# Patient Record
Sex: Female | Born: 1955 | Race: Black or African American | Hispanic: No | Marital: Single | Smoking: Never smoker
Health system: Southern US, Community
[De-identification: ages and names within clinical notes are randomized; demographics above are authoritative.]

## PROBLEM LIST (undated history)

## (undated) DIAGNOSIS — D259 Leiomyoma of uterus, unspecified: Secondary | ICD-10-CM

## (undated) DIAGNOSIS — E785 Hyperlipidemia, unspecified: Secondary | ICD-10-CM

## (undated) DIAGNOSIS — I10 Essential (primary) hypertension: Secondary | ICD-10-CM

## (undated) DIAGNOSIS — K760 Fatty (change of) liver, not elsewhere classified: Secondary | ICD-10-CM

## (undated) DIAGNOSIS — G629 Polyneuropathy, unspecified: Secondary | ICD-10-CM

## (undated) DIAGNOSIS — I679 Cerebrovascular disease, unspecified: Secondary | ICD-10-CM

## (undated) DIAGNOSIS — E663 Overweight: Secondary | ICD-10-CM

## (undated) DIAGNOSIS — R7989 Other specified abnormal findings of blood chemistry: Secondary | ICD-10-CM

## (undated) DIAGNOSIS — M300 Polyarteritis nodosa: Secondary | ICD-10-CM

## (undated) HISTORY — DX: Polyneuropathy, unspecified: G62.9

## (undated) HISTORY — DX: Fatty (change of) liver, not elsewhere classified: K76.0

## (undated) HISTORY — DX: Overweight: E66.3

## (undated) HISTORY — DX: Essential (primary) hypertension: I10

## (undated) HISTORY — DX: Other specified abnormal findings of blood chemistry: R79.89

## (undated) HISTORY — DX: Polyarteritis nodosa: M30.0

## (undated) HISTORY — DX: Hyperlipidemia, unspecified: E78.5

## (undated) HISTORY — DX: Cerebrovascular disease, unspecified: I67.9

## (undated) HISTORY — DX: Leiomyoma of uterus, unspecified: D25.9

---
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
## 2002-09-13 ENCOUNTER — Encounter: Payer: Self-pay | Admitting: Internal Medicine

## 2002-09-13 ENCOUNTER — Ambulatory Visit (HOSPITAL_COMMUNITY): Admission: RE | Admit: 2002-09-13 | Discharge: 2002-09-13 | Payer: Self-pay | Admitting: Internal Medicine

## 2007-11-30 ENCOUNTER — Ambulatory Visit: Payer: Self-pay | Admitting: Family Medicine

## 2007-11-30 DIAGNOSIS — I1A0 Resistant hypertension: Secondary | ICD-10-CM | POA: Insufficient documentation

## 2007-11-30 DIAGNOSIS — D259 Leiomyoma of uterus, unspecified: Secondary | ICD-10-CM | POA: Insufficient documentation

## 2007-11-30 DIAGNOSIS — I10 Essential (primary) hypertension: Secondary | ICD-10-CM | POA: Insufficient documentation

## 2007-12-22 ENCOUNTER — Ambulatory Visit (HOSPITAL_COMMUNITY): Admission: RE | Admit: 2007-12-22 | Discharge: 2007-12-22 | Payer: Self-pay | Admitting: Family Medicine

## 2007-12-23 ENCOUNTER — Ambulatory Visit: Payer: Self-pay | Admitting: Internal Medicine

## 2007-12-25 HISTORY — PX: COLONOSCOPY: SHX174

## 2008-01-04 ENCOUNTER — Other Ambulatory Visit: Admission: RE | Admit: 2008-01-04 | Discharge: 2008-01-04 | Payer: Self-pay | Admitting: Family Medicine

## 2008-01-04 ENCOUNTER — Ambulatory Visit: Payer: Self-pay | Admitting: Family Medicine

## 2008-01-04 ENCOUNTER — Encounter: Payer: Self-pay | Admitting: Family Medicine

## 2008-01-18 ENCOUNTER — Ambulatory Visit (HOSPITAL_COMMUNITY): Admission: RE | Admit: 2008-01-18 | Discharge: 2008-01-18 | Payer: Self-pay | Admitting: Internal Medicine

## 2008-01-18 ENCOUNTER — Ambulatory Visit: Payer: Self-pay | Admitting: Internal Medicine

## 2008-01-25 ENCOUNTER — Encounter: Payer: Self-pay | Admitting: Family Medicine

## 2008-01-27 ENCOUNTER — Encounter: Payer: Self-pay | Admitting: Family Medicine

## 2008-01-30 ENCOUNTER — Encounter: Payer: Self-pay | Admitting: Family Medicine

## 2008-01-30 LAB — CONVERTED CEMR LAB
Basophils Absolute: 0 10*3/uL (ref 0.0–0.1)
CO2: 27 meq/L (ref 19–32)
Calcium: 10.1 mg/dL (ref 8.4–10.5)
Chloride: 103 meq/L (ref 96–112)
Cholesterol: 220 mg/dL — ABNORMAL HIGH (ref 0–200)
Creatinine, Ser: 0.84 mg/dL (ref 0.40–1.20)
Hemoglobin: 12.5 g/dL (ref 12.0–15.0)
Monocytes Absolute: 0.4 10*3/uL (ref 0.1–1.0)
Neutro Abs: 3.8 10*3/uL (ref 1.7–7.7)
Neutrophils Relative %: 57 % (ref 43–77)
Platelets: 262 10*3/uL (ref 150–400)
Potassium: 4 meq/L (ref 3.5–5.3)
RDW: 14.4 % (ref 11.5–15.5)
Sodium: 142 meq/L (ref 135–145)
Triglycerides: 130 mg/dL (ref <150)
VLDL: 26 mg/dL (ref 0–40)

## 2008-02-14 ENCOUNTER — Encounter: Payer: Self-pay | Admitting: Family Medicine

## 2008-05-04 ENCOUNTER — Encounter: Payer: Self-pay | Admitting: Family Medicine

## 2008-05-09 ENCOUNTER — Ambulatory Visit: Payer: Self-pay | Admitting: Family Medicine

## 2008-05-09 DIAGNOSIS — E785 Hyperlipidemia, unspecified: Secondary | ICD-10-CM

## 2008-05-13 DIAGNOSIS — E663 Overweight: Secondary | ICD-10-CM

## 2008-09-24 ENCOUNTER — Ambulatory Visit: Payer: Self-pay | Admitting: Family Medicine

## 2008-09-25 ENCOUNTER — Encounter: Payer: Self-pay | Admitting: Family Medicine

## 2008-09-26 LAB — CONVERTED CEMR LAB
BUN: 15 mg/dL (ref 6–23)
CO2: 24 meq/L (ref 19–32)
Chloride: 107 meq/L (ref 96–112)
Cholesterol: 191 mg/dL (ref 0–200)
Creatinine, Ser: 0.91 mg/dL (ref 0.40–1.20)
HDL: 46 mg/dL (ref >39)
Potassium: 4 meq/L (ref 3.5–5.3)
Sodium: 143 meq/L (ref 135–145)

## 2009-01-03 ENCOUNTER — Ambulatory Visit (HOSPITAL_COMMUNITY): Admission: RE | Admit: 2009-01-03 | Discharge: 2009-01-03 | Payer: Self-pay | Admitting: Family Medicine

## 2009-01-24 ENCOUNTER — Other Ambulatory Visit: Admission: RE | Admit: 2009-01-24 | Discharge: 2009-01-24 | Payer: Self-pay | Admitting: Family Medicine

## 2009-01-24 ENCOUNTER — Ambulatory Visit: Payer: Self-pay | Admitting: Family Medicine

## 2009-01-24 DIAGNOSIS — H547 Unspecified visual loss: Secondary | ICD-10-CM | POA: Insufficient documentation

## 2009-01-25 ENCOUNTER — Encounter: Payer: Self-pay | Admitting: Family Medicine

## 2009-02-28 ENCOUNTER — Encounter: Payer: Self-pay | Admitting: Family Medicine

## 2009-12-17 ENCOUNTER — Telehealth: Payer: Self-pay | Admitting: Family Medicine

## 2009-12-17 ENCOUNTER — Ambulatory Visit: Payer: Self-pay | Admitting: Family Medicine

## 2009-12-24 ENCOUNTER — Encounter: Payer: Self-pay | Admitting: Family Medicine

## 2009-12-31 LAB — CONVERTED CEMR LAB
BUN: 14 mg/dL (ref 6–23)
Basophils Absolute: 0 10*3/uL (ref 0.0–0.1)
CO2: 28 meq/L (ref 19–32)
Calcium: 9.9 mg/dL (ref 8.4–10.5)
Chloride: 104 meq/L (ref 96–112)
Eosinophils Relative: 2 % (ref 0–5)
Glucose, Bld: 86 mg/dL (ref 70–99)
LDL Cholesterol: 140 mg/dL — ABNORMAL HIGH (ref 0–99)
Lymphocytes Relative: 30 % (ref 12–46)
Monocytes Relative: 6 % (ref 3–12)
Potassium: 3.9 meq/L (ref 3.5–5.3)
RBC: 4.63 M/uL (ref 3.87–5.11)
Sodium: 142 meq/L (ref 135–145)
TSH: 1.984 microintl units/mL (ref 0.350–4.500)

## 2010-01-06 ENCOUNTER — Ambulatory Visit (HOSPITAL_COMMUNITY): Admission: RE | Admit: 2010-01-06 | Discharge: 2010-01-06 | Payer: Self-pay | Admitting: Family Medicine

## 2010-02-19 ENCOUNTER — Ambulatory Visit: Payer: Self-pay | Admitting: Family Medicine

## 2010-02-19 ENCOUNTER — Other Ambulatory Visit
Admission: RE | Admit: 2010-02-19 | Discharge: 2010-02-19 | Payer: Self-pay | Source: Home / Self Care | Admitting: Family Medicine

## 2010-02-20 LAB — HM MAMMOGRAPHY

## 2010-02-20 LAB — HM COLONOSCOPY

## 2010-02-20 LAB — HM PAP SMEAR

## 2010-02-21 ENCOUNTER — Encounter: Payer: Self-pay | Admitting: Family Medicine

## 2010-03-16 ENCOUNTER — Encounter: Payer: Self-pay | Admitting: Internal Medicine

## 2010-03-23 LAB — CONVERTED CEMR LAB
Nitrite: NEGATIVE
Urobilinogen, UA: 2
WBC Urine, dipstick: NEGATIVE

## 2010-03-25 NOTE — Miscellaneous (Signed)
Summary: refill  Clinical Lists Changes  Medications: Rx of HYZAAR 100-25 MG TABS (LOSARTAN POTASSIUM-HCTZ) Take 1 tablet by mouth once a day;  #30 Each x 3;  Signed;  Entered by: Everitt Amber;  Authorized by: Syliva Overman MD;  Method used: Electronically to [facility] [address], [place], [place], [place]  [address], Ph: [phone], Fax: [phone]    Prescriptions: HYZAAR 100-25 MG TABS (LOSARTAN POTASSIUM-HCTZ) Take 1 tablet by mouth once a day  #30 Each x 3   Entered by:   Everitt Amber   Authorized by:   Syliva Overman MD   Signed by:   Everitt Amber on 02/28/2009   Method used:   Electronically to        [facility]  [address] (retail)       [address]       [place], [place]  [address]       Ph: [phone]       Fax: [phone]   RxID:   [id number]

## 2010-03-25 NOTE — Letter (Signed)
Summary: Letter  Letter   Imported By: Lind Guest 12/25/2009 11:32:08  _____________________________________________________________________  External Attachment:    Type:   Image     Comment:   External Document

## 2010-03-25 NOTE — Assessment & Plan Note (Signed)
Summary: meds   Vital Signs:  Patient profile:   55 year old female Menstrual status:  postmenopausal Height:      63 inches Weight:      169.25 pounds BMI:     30.09 O2 Sat:      97 % on Room air Pulse rate:   87 / minute Pulse rhythm:   regular Resp:     16 per minute BP sitting:   140 / 76  (left arm)  Vitals Entered By: Mauricia Area CMA (December 17, 2009 11:09 AM)  Nutrition Counseling: Patient's BMI is greater than 25 and therefore counseled on weight management options.  O2 Flow:  Room air CC: follow up   CC:  follow up.  History of Present Illness: Reports  thatshe has been doingwell. she is still not exercising on a daily basis, and he eating habits are unchanged, hence her weight is increased Denies recent fever or chills. Denies sinus pressure, nasal congestion , ear pain or sore throat. Denies chest congestion, or cough productive of sputum. Denies chest pain, palpitations, PND, orthopnea or leg swelling. Denies abdominal pain, nausea, vomitting, diarrhea or constipation. Denies change in bowel movements or bloody stool. Denies dysuria , frequency, incontinence or hesitancy. Denies  joint pain, swelling, or reduced mobility. Denies headaches, vertigo, seizures. Denies depression, anxiety or insomnia. Denies  rash, lesions, or itch.     Current Medications (verified): 1)  Calcium 600/vitamin D 600-400 Mg-Unit Chew (Calcium Carbonate-Vitamin D) .... Take 1 Tablet By Mouth Two Times A Day 2)  Hyzaar 100-25 Mg Tabs (Losartan Potassium-Hctz) .... Take 1 Tablet By Mouth Once A Day 3)  Centrum .Marland Kitchen.. 1 Tab Daily  Allergies (verified): No Known Drug Allergies  Review of Systems      See HPI Eyes:  Denies blurring and discharge. Psych:  Denies anxiety. Endo:  Denies excessive thirst and excessive urination. Heme:  Denies abnormal bruising and bleeding. Allergy:  Denies hives or rash and itching eyes.  Physical Exam  General:   Well-developed,well-nourished,in no acute distress; alert,appropriate and cooperative throughout examination HEENT: No facial asymmetry,  EOMI, No sinus tenderness, TM's Clear, oropharynx  pink and moist.   Chest: Clear to auscultation bilaterally.  CVS: S1, S2, No murmurs, No S3.   Abd: Soft, Nontender.  MS: Adequate ROM spine, hips, shoulders and knees.  Ext: No edema.   CNS: CN 2-12 intact, power tone and sensation normal throughout.   Skin: Intact, no visible lesions or rashes.  Psych: Good eye contact, normal affect.  Memory intact, not anxious or depressed appearing.    Impression & Recommendations:  Problem # 1:  UNSPECIFIED ESSENTIAL HYPERTENSION (ICD-401.9) Assessment Deteriorated  Her updated medication list for this problem includes:    Hyzaar 100-25 Mg Tabs (Losartan potassium-hctz) .Marland Kitchen... Take 1 tablet by mouth once a day Patient advised to follow low sodium diet rich in fruit and vegetables, and to commit to at least 30 minutes 5 days per week of regular exercise , to improve blood presure control.   Orders: T-Basic Metabolic Panel [id number])  Problem # 2:  OVERWEIGHT (ICD-278.02) Assessment: Deteriorated  Ht: 63 (12/17/2009)   Wt: 169.25 (12/17/2009)   BMI: 30.09 (12/17/2009) therapeutic lifestyle change discussed and encouraged  Complete Medication List: 1)  Calcium 600/vitamin D 600-400 Mg-unit Chew (Calcium carbonate-vitamin d) .... Take 1 tablet by mouth two times a day 2)  Hyzaar 100-25 Mg Tabs (Losartan potassium-hctz) .... 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  2)  It is important that you exercise regularly at least 20 minutes 5 times a week. If you develop  chest pain, have severe difficulty breathing, or feel very tired , stop exercising immediately and seek medical attention. 3)  You need to lose weight. Consider a lower calorie diet and regular exercise.  4)  BMP prior to visit, ICD-9: 5)  Lipid Panel prior to visit, ICD-9:   fasting asap 6)  TSH prior to visit, ICD-9: 7)  CBC w/ Diff prior to visit, ICD-9: 8)  [place] due pls schedule Prescriptions: HYZAAR 100-25 MG TABS (LOSARTAN POTASSIUM-HCTZ) Take 1 tablet by mouth once a day  #30 Each x 3   Entered by:   Adella Hare LPN   Authorized by:   Syliva Overman MD   Signed by:   Adella Hare LPN on 43/32/9518   Method used:   Electronically to        [facility]  [address] (retail)       [address]       [place], [place]  [address]       Ph: [phone]       Fax: [phone]   RxID:   [id number]    Orders Added: 1)  Est. Patient Level IV [17616] 2)  T-Basic Metabolic Panel [[id number]] 3)  T-Lipid Profile [80061-22930] 4)  T-CBC w/Diff [[id number]] 5)  T-TSH [[id number]] 6)  Influenza Vaccine NON MCR [00028] 7)  Tdap => 31yrs IM [90715] 8)  Admin 1st Vaccine [37169]   Immunizations Administered:  Influenza Vaccine # 1:    Vaccine Type: Fluvax Non-MCR    Site: right deltoid    Mfr: novartis    Dose: 0.5 ml    Route: IM    Given by: Adella Hare LPN    Exp. Date: 06/2010    Lot #: [id number]    VIS given: 09/17/09 version given December 17, 2009.  Tetanus Vaccine:    Vaccine Type: Tdap    Site: left deltoid    Mfr: GlaxoSmithKline    Dose: 0.5 ml    Route: IM    Given by: Adella Hare LPN    Exp. Date: 12/13/2011    Lot #: [id number]    VIS given: 01/11/08 version given December 17, 2009.   Immunizations Administered:  Influenza Vaccine # 1:    Vaccine Type: Fluvax Non-MCR    Site: right deltoid    Mfr: novartis    Dose: 0.5 ml    Route: IM    Given by: Adella Hare LPN    Exp. Date: 06/2010    Lot #: [id number]    VIS given: 09/17/09  version given December 17, 2009.  Tetanus Vaccine:    Vaccine Type: Tdap    Site: left deltoid    Mfr: GlaxoSmithKline    Dose: 0.5 ml    Route: IM    Given by: Adella Hare LPN    Exp. Date: 12/13/2011    Lot #: [id number]    VIS given: 01/11/08 version given December 17, 2009.

## 2010-03-25 NOTE — Progress Notes (Signed)
Summary: medicine  Phone Note Call from Patient   Summary of Call: needs her hyzaar send to [facility] [place] [facility] Initial call taken by: Lind Guest,  December 17, 2009 12:08 PM    Prescriptions: HYZAAR 100-25 MG TABS (LOSARTAN POTASSIUM-HCTZ) Take 1 tablet by mouth once a day  #30 Each x 1   Entered by:   Adella Hare LPN   Authorized by:   Syliva Overman MD   Signed by:   Adella Hare LPN on 16/11/9602   Method used:   Electronically to        [facility]  [address] (retail)       [address]       [place], [place]  [address]       Ph: [phone]       Fax: [phone]   RxID:   [id number]
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
## 2010-03-27 NOTE — Letter (Signed)
Summary: Pap Smear, Normal Letter, [facility]  [address]   [place], [place] [address]   Phone: [phone]  Fax: [phone]          February 21, 2010    Dear: Faith Hood    I am pleased to notify you that your PAP smear was normal.  You will need your next PAP smear in:     ____ 3 Months    ____ 6 Months    ____ 12 Months    Please call the office at our office number above, to schedule your next appointment.    Sincerely,     [facility]

## 2010-06-04 ENCOUNTER — Emergency Department (HOSPITAL_COMMUNITY)
Admission: EM | Admit: 2010-06-04 | Discharge: 2010-06-04 | Disposition: A | Discharge status: Home / Self Care | Payer: No Typology Code available for payment source | Attending: Emergency Medicine | Admitting: Emergency Medicine

## 2010-06-04 DIAGNOSIS — Y92410 Unspecified street and highway as the place of occurrence of the external cause: Secondary | ICD-10-CM | POA: Insufficient documentation

## 2010-06-04 DIAGNOSIS — IMO0001 Reserved for inherently not codable concepts without codable children: Secondary | ICD-10-CM | POA: Insufficient documentation

## 2010-06-04 DIAGNOSIS — I10 Essential (primary) hypertension: Secondary | ICD-10-CM | POA: Insufficient documentation

## 2010-06-04 DIAGNOSIS — M546 Pain in thoracic spine: Secondary | ICD-10-CM | POA: Insufficient documentation

## 2010-06-13 ENCOUNTER — Telehealth: Payer: Self-pay | Admitting: Family Medicine

## 2010-06-13 NOTE — Telephone Encounter (Signed)
Do you want to work patient in next week?

## 2010-06-13 NOTE — Telephone Encounter (Signed)
pls work in pt one day next week

## 2010-06-13 NOTE — Telephone Encounter (Signed)
Appt. 4.24.12 @ 4:00

## 2010-06-16 ENCOUNTER — Encounter: Payer: Self-pay | Admitting: Family Medicine

## 2010-06-17 ENCOUNTER — Ambulatory Visit (INDEPENDENT_AMBULATORY_CARE_PROVIDER_SITE_OTHER): Payer: No Typology Code available for payment source | Admitting: Family Medicine

## 2010-06-17 ENCOUNTER — Encounter: Payer: Self-pay | Admitting: Family Medicine

## 2010-06-17 DIAGNOSIS — I10 Essential (primary) hypertension: Secondary | ICD-10-CM

## 2010-06-17 MED ORDER — CLONIDINE HCL 0.1 MG PO TABS: 0.1000 mg | ORAL_TABLET | Freq: Every day | ORAL | Status: DC
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
## 2010-06-18 ENCOUNTER — Encounter: Payer: Self-pay | Admitting: Family Medicine

## 2010-06-20 ENCOUNTER — Other Ambulatory Visit: Payer: Self-pay | Admitting: Family Medicine

## 2010-06-23 ENCOUNTER — Other Ambulatory Visit: Payer: Self-pay

## 2010-06-23 MED ORDER — LOSARTAN POTASSIUM-HCTZ 100-25 MG PO TABS: 1.0000 | ORAL_TABLET | Freq: Every day | ORAL | Status: DC

## 2010-07-08 NOTE — Op Note (Signed)
NAME:  Faith Hood, Faith Hood                   ACCOUNT NO.:  [id number]   MEDICAL RECORD NO.:  [id number]          PATIENT TYPE:  AMB   LOCATION:  DAY                           FACILITY:  [facility]   PHYSICIAN:  R. Roetta Sessions, M.D. DATE OF BIRTH:  09-23-1955   DATE OF PROCEDURE:  01/18/2008  DATE OF DISCHARGE:                               OPERATIVE REPORT   PROCEDURE:  Ileocolonoscopy diagnostic.   INDICATIONS FOR PROCEDURE:  A 55 year old African American lady with  abdominal bloating and constipation.  She has never had her lower GI  tract imaged.  There is no family history of colorectal neoplasia.  Colonoscopy is now being done.  Risks, benefits, alternatives, and  limitations have been reviewed, and questions answered.  Please see the  documentation in the medical record for more information.   PROCEDURE NOTE:  O2 saturation, blood pressure, pulse, and respirations  were monitored throughout the entire procedure.   CONSCIOUS SEDATION:  Versed 3 mg IV and Demerol 75 mg IV in divided  doses.   INSTRUMENT:  Pentax video chip system.   FINDINGS:  Digital rectal exam revealed no abnormalities.  Endoscopic  findings:  The prep was adequate.  Colon:  Colonic mucosa was surveyed  from the rectosigmoid junction through the left transverse, right colon,  to the appendiceal orifice, ileocecal valve, and cecum.  These  structures were well seen and photographed for the record.  Terminal  ileum was intubated to 5 cm.  From this level, scope was slowly  withdrawn.  All previously mentioned mucosal surfaces were again seen.  The colonic mucosa as well as the terminal ileal mucosa appeared  entirely normal.  The scope was pulled down the rectum with thorough  examination of the rectal mucosa including the retroflex view of the  anal verge demonstrated no abnormalities.  The patient tolerated the  procedure well and was reacted in Endoscopy.   IMPRESSION:  Normal rectum:  Terminal ileum.   RECOMMENDATIONS:  1. Constipation literature provided to Ms. Helmuth.  2. Begin digestive advantage for constipation 1 capsule daily.  Ms.      Goonan is to go by my office for free samples in      addition to probiotic therapy.  We will advise Ms. Clune to use      MiraLax 17 g orally nightly if no bowel movement on any given day.  3. Repeat screening colonoscopy in 10 years.      Jonathon Bellows, M.D.  Electronically Signed     RMR/MEDQ  D:  01/18/2008  T:  01/18/2008  Job:  [id number]   cc:   Milus Mallick. Lodema Hong, M.D.  Fax: [phone]

## 2010-07-08 NOTE — Consult Note (Signed)
NAME:  Bistline, Sharesa                   ACCOUNT NO.:  [id number]   MEDICAL RECORD NO.:  [id number]          PATIENT TYPE:  AMB   LOCATION:  DAY                           FACILITY:  [facility]   PHYSICIAN:  R. Roetta Sessions, M.D. DATE OF BIRTH:  May 16, 1955   DATE OF CONSULTATION:  12/23/2007  DATE OF DISCHARGE:                                 CONSULTATION   REFERRING PHYSICIAN:  Milus Mallick. Lodema Hong, MD.   REASON FOR CONSULTATION:  Abdominal bloating and constipation, positive  family history of pancreatic cancer, and no prior colorectal cancer  screening.   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 Syliva Overman to further evaluate actually a 2-3 year history of  intermittent abdominal bloating in the setting of infrequent bowel  movements.  Ms. Stambaugh tells me she has 1-2 bowel movements weekly, has to  take stool softeners and laxatives on occasion, rarely has more than 3  bowel movements in a week, never passed any blood per rectum, has really  had much in the way of abdominal pain, although she does bloat and she  is pretty much put up with these symptoms over the past couple of years.  Unfortunately, her 16 year old mother just succumbed to pancreatic  cancer 3 weeks ago.  Otherwise, there is no family history of any GI  malignancy.  Faith Hood never had her lower GI tract evaluated.  She  denies odynophagia, dysphagia, esophageal reflux symptoms, nausea, or  vomiting.  She denies weight loss.  I do see that she underwent an  abdominal pelvic CT back just yesterday, on December 22, 2007, she was  found to have a fatty liver and probable leiomyoma in the uterus, but  really no other abnormalities.   PAST MEDICAL HISTORY:  Significant for chronic fatigue and hypertension.   PAST SURGERIES:  Exploratory laparoscopy years ago.   MEDICATIONS:  Benicar HCT 40/12.5 daily.   ALLERGIES:   No known drug allergies.   FAMILY HISTORY:  As outlined above.  Father died at age 55 with a brain  tumor.  She has 4 sisters in good health.   REVIEW OF SYSTEMS:  As in the history of present illness, has any chest  pain or dyspnea on exertion.  No fever, chills, or night sweats.  No  change in weight.   PHYSICAL EXAMINATION:  GENERAL:  A pleasant 55 year old lady resting  comfortably.  VITAL SIGNS:  Weight 163, height 5 feet 3 inches, temperature 98.7,  blood pressure 110/82, and pulse 60.  SKIN:  Warm and dry.  HEENT:  No scleral icterus.  Conjunctivae are pink.  CHEST:  Lungs are clear to auscultation.  CARDIAC:  Regular rate and rhythm without murmur, gallop, or rub.  BREASTS:  Deferred.  ABDOMEN:  Nondistended.  Positive bowel sounds.  Soft and nontender  without appreciable mass or organomegaly.  EXTREMITIES:  No edema.  RECTAL:  Deferred colonoscopy.   IMPRESSION:  Faith Hood is a pleasant 55 year old lady with  abdominal  bloating in the setting of constipation.  She does have a positive  family history of pancreatic cancer in her mother, but otherwise does  not have any history of family history of colorectal neoplasia or other  gastrointestinal malignancies.   I suspect her bloating is in part related to constipation.  She is in  need of colorectal cancer screening as well.   RECOMMENDATIONS:  We will go ahead and offer Ms. Bencivenga a colonoscopy in  the very near future.  Risks, benefits, alternatives, and limitations  have been reviewed.  Once her colon has been examined, I will make  further recommendations in terms of the management for abdominal  bloating and constipation.  She was noted to have a fatty liver on CT  imaging recently.  She probably will go ahead and have a hepatic profile  in the near future as well.  Further recommendations is to follow.   I would like to thank Dr. Syliva Overman for allowing me to see this  very nice lady today.      Jonathon Bellows, M.D.  Electronically Signed     RMR/MEDQ  D:  12/23/2007  T:  12/24/2007  Job:  [id number]   cc:   Milus Mallick. Lodema Hong, M.D.  Fax: [phone]

## 2010-08-01 ENCOUNTER — Encounter: Payer: Self-pay | Admitting: Family Medicine

## 2010-08-07 ENCOUNTER — Ambulatory Visit (INDEPENDENT_AMBULATORY_CARE_PROVIDER_SITE_OTHER): Payer: No Typology Code available for payment source | Admitting: Family Medicine

## 2010-08-07 ENCOUNTER — Encounter: Payer: Self-pay | Admitting: Family Medicine

## 2010-08-07 VITALS — BP 110/82 | HR 55 | Resp 16 | Ht 64.5 in | Wt 164.1 lb

## 2010-08-07 DIAGNOSIS — E785 Hyperlipidemia, unspecified: Secondary | ICD-10-CM

## 2010-08-07 DIAGNOSIS — I10 Essential (primary) hypertension: Secondary | ICD-10-CM

## 2010-08-07 MED ORDER — AMLODIPINE BESYLATE 2.5 MG PO TABS: 2.5000 mg | ORAL_TABLET | Freq: Every day | ORAL | Status: DC

## 2010-08-07 NOTE — Patient Instructions (Addendum)
F/u in 2 months.  You may stop the clonidine since it is making you  Too sleepy. Continue to take hyzaar every day, at 6pm, and add  Amlodipine 2.5mg  one daily at the same time.Start today.  Chem7 today.

## 2010-08-08 LAB — BASIC METABOLIC PANEL
BUN: 11 mg/dL (ref 6–23)
Calcium: 9.7 mg/dL (ref 8.4–10.5)
Chloride: 106 mEq/L (ref 96–112)
Creat: 0.76 mg/dL (ref 0.50–1.10)
Sodium: 143 mEq/L (ref 135–145)

## 2010-08-13 ENCOUNTER — Ambulatory Visit (HOSPITAL_COMMUNITY): Payer: No Typology Code available for payment source | Admitting: Specialist

## 2010-08-15 ENCOUNTER — Ambulatory Visit (HOSPITAL_COMMUNITY)
Admission: RE | Admit: 2010-08-15 | Discharge: 2010-08-15 | Disposition: A | Discharge status: Home / Self Care | Payer: No Typology Code available for payment source | Source: Ambulatory Visit | Attending: Orthopedic Surgery | Admitting: Orthopedic Surgery

## 2010-08-15 DIAGNOSIS — M79609 Pain in unspecified limb: Secondary | ICD-10-CM | POA: Insufficient documentation

## 2010-08-15 DIAGNOSIS — IMO0001 Reserved for inherently not codable concepts without codable children: Secondary | ICD-10-CM | POA: Insufficient documentation

## 2010-08-15 DIAGNOSIS — M25519 Pain in unspecified shoulder: Secondary | ICD-10-CM | POA: Insufficient documentation

## 2010-08-15 DIAGNOSIS — M25619 Stiffness of unspecified shoulder, not elsewhere classified: Secondary | ICD-10-CM | POA: Insufficient documentation

## 2010-08-15 DIAGNOSIS — I10 Essential (primary) hypertension: Secondary | ICD-10-CM | POA: Insufficient documentation

## 2010-08-15 DIAGNOSIS — M6281 Muscle weakness (generalized): Secondary | ICD-10-CM | POA: Insufficient documentation

## 2010-08-15 DIAGNOSIS — M542 Cervicalgia: Secondary | ICD-10-CM | POA: Insufficient documentation
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
## 2010-08-17 NOTE — Assessment & Plan Note (Signed)
Medication compliance addressed. Commitment to regular exercise and healthy  food choices, with portion control discussed. DASH diet and low fat diet discussed and literature offered. Changes in medication made at this visit.  

## 2010-08-17 NOTE — Assessment & Plan Note (Signed)
Low fat diet discussed , needs rept lab data

## 2010-08-18 ENCOUNTER — Ambulatory Visit (HOSPITAL_COMMUNITY)
Admission: RE | Admit: 2010-08-18 | Discharge: 2010-08-18 | Disposition: A | Discharge status: Home / Self Care | Payer: No Typology Code available for payment source | Source: Ambulatory Visit | Attending: Family Medicine | Admitting: Family Medicine

## 2010-08-21 ENCOUNTER — Ambulatory Visit (HOSPITAL_COMMUNITY)
Admission: RE | Admit: 2010-08-21 | Discharge: 2010-08-21 | Disposition: A | Discharge status: Home / Self Care | Payer: No Typology Code available for payment source | Source: Ambulatory Visit | Attending: Family Medicine | Admitting: Family Medicine

## 2010-08-25 ENCOUNTER — Ambulatory Visit (HOSPITAL_COMMUNITY)
Admission: RE | Admit: 2010-08-25 | Discharge: 2010-08-25 | Disposition: A | Discharge status: Home / Self Care | Payer: No Typology Code available for payment source | Source: Ambulatory Visit | Attending: Family Medicine | Admitting: Family Medicine

## 2010-08-25 DIAGNOSIS — M25619 Stiffness of unspecified shoulder, not elsewhere classified: Secondary | ICD-10-CM | POA: Insufficient documentation

## 2010-08-25 DIAGNOSIS — M79609 Pain in unspecified limb: Secondary | ICD-10-CM | POA: Insufficient documentation

## 2010-08-25 DIAGNOSIS — I10 Essential (primary) hypertension: Secondary | ICD-10-CM | POA: Insufficient documentation

## 2010-08-25 DIAGNOSIS — M25519 Pain in unspecified shoulder: Secondary | ICD-10-CM | POA: Insufficient documentation

## 2010-08-25 DIAGNOSIS — M6281 Muscle weakness (generalized): Secondary | ICD-10-CM | POA: Insufficient documentation

## 2010-08-25 DIAGNOSIS — IMO0001 Reserved for inherently not codable concepts without codable children: Secondary | ICD-10-CM | POA: Insufficient documentation

## 2010-08-25 DIAGNOSIS — M542 Cervicalgia: Secondary | ICD-10-CM | POA: Insufficient documentation

## 2010-08-28 ENCOUNTER — Ambulatory Visit (HOSPITAL_COMMUNITY)
Admission: RE | Admit: 2010-08-28 | Discharge: 2010-08-28 | Disposition: A | Discharge status: Home / Self Care | Payer: No Typology Code available for payment source | Source: Ambulatory Visit | Attending: Family Medicine | Admitting: Family Medicine

## 2010-09-03 ENCOUNTER — Ambulatory Visit (HOSPITAL_COMMUNITY)
Admission: RE | Admit: 2010-09-03 | Discharge: 2010-09-03 | Disposition: A | Discharge status: Home / Self Care | Payer: No Typology Code available for payment source | Source: Ambulatory Visit | Attending: Family Medicine | Admitting: Family Medicine

## 2010-09-03 DIAGNOSIS — M25519 Pain in unspecified shoulder: Secondary | ICD-10-CM | POA: Insufficient documentation

## 2010-09-03 DIAGNOSIS — M25511 Pain in right shoulder: Secondary | ICD-10-CM | POA: Insufficient documentation

## 2010-09-03 DIAGNOSIS — M6281 Muscle weakness (generalized): Secondary | ICD-10-CM | POA: Insufficient documentation

## 2010-09-03 NOTE — Progress Notes (Signed)
Occupational Therapy Treatment  Patient Name: KELSE PLOCH MRN: [id number] Today's Date: 09/03/2010         Time in:  2:05   Time out:  3:11              HPI: Symptoms/Limitations Symptoms: I lifted some little boxes at home and now it hurts Pain Assessment Currently in Pain?: Yes Pain Score:   3 Pain Location: Arm Pain Orientation: Right Pain Type: Chronic pain Pain Onset: More than a month ago Pain Frequency: Intermittent  Precautions/Restrictions    Mobility       Exercise/Treatments Cervical Exercises Shoulder Flexion: PROM;Supine;Prone;Other reps (comment);Right;Strengthening;Seated (1# x 12 rep prone, 2# x 12 seated) Shoulder Extension: Strengthening;Other reps (comment);Prone Theraband Level (Shoulder Extension): Level 3 (Green) Shoulder ABduction: PROM;Strengthening;Right;Other reps (comment);Seated (2# x 12 reps seated) Shoulder Horizontal ABduction: Strengthening;Both;Right;Prone;Seated;PROM (1# x 12 reps prone, 2# x 12 reps seated) Shoulder Retraction: Strengthening;Prone;Other (comment);Right (1# x 12 reps) Theraband Level (Shoulder Retraction): Level 3 (Green) Theraband Level (Row): Level 3 (Green) Shoulder Internal Rotation: PROM;Strengthening;Right;Other reps (comment);Supine;Prone;Seated (1# x12 prone, 2# x 12 seated) Shoulder External Rotation: PROM;Strengthening;Right;Other reps (comment);Supine;Prone;Seated (1# x 12 prone, 2# x 12 seated) UBE (Upper Arm Bike): 3 min reverse 2.0 resistance Shoulder Exercises Shoulder Flexion: PROM;Supine;Prone;Other reps (comment);Right;Strengthening;Seated (1# x 12 rep prone, 2# x 12 seated) Shoulder Extension: Strengthening;Other reps (comment);Prone Theraband Level (Shoulder Extension): Level 3 (Green) Shoulder Retraction: Strengthening;Prone;Other (comment);Right (1# x 12 reps) Theraband Level (Shoulder Retraction): Level 3 (Green) Shoulder Horizontal ABduction: Strengthening;Both;Right;Prone;Seated;PROM (1# x 12 reps  prone, 2# x 12 reps seated) Shoulder Horizontal ADduction: PROM;Strengthening;Right;Other reps (comment);Seated Shoulder ABduction: PROM;Strengthening;Right;Other reps (comment);Seated (2# x 12 reps seated) Shoulder External Rotation: PROM;Strengthening;Right;Other reps (comment);Supine;Prone;Seated (1# x 12 prone, 2# x 12 seated) Shoulder Internal Rotation: PROM;Strengthening;Right;Other reps (comment);Supine;Prone;Seated (1# x12 prone, 2# x 12 seated) Theraband Level (Row): Level 3 (Green) Additional ROM/Strengthening Exercises UBE (Upper Arm Bike): 3 min reverse 2.0 resistance Thumb Tacks: 2' Elevation/Depression - Shoulder Pro/Retraction: 2' "W" Arms: x 12 with 1# x to v 1# x 12 Additional Elbow Exercises UBE (Upper Arm Bike): 3 min reverse 2.0 resistance Theraputty - Flatten: green (green) Theraputty - Roll: green Theraputty - Grip: green Additional Wrist Exercises Theraputty - Flatten: green (green) Theraputty - Roll: green Theraputty - Grip: green Hand Exercises Theraputty - Flatten: green (green) Theraputty - Roll: green Theraputty - Grip: green Neurological Re-education Exercises Shoulder Flexion: PROM;Supine;Prone;Other reps (comment);Right;Strengthening;Seated (1# x 12 rep prone, 2# x 12 seated) Shoulder ABduction: PROM;Strengthening;Right;Other reps (comment);Seated (2# x 12 reps seated) Shoulder Horizontal ABduction: Strengthening;Both;Right;Prone;Seated;PROM (1# x 12 reps prone, 2# x 12 reps seated) Shoulder External Rotation: PROM;Strengthening;Right;Other reps (comment);Supine;Prone;Seated (1# x 12 prone, 2# x 12 seated) Shoulder Internal Rotation: PROM;Strengthening;Right;Other reps (comment);Supine;Prone;Seated (1# x12 prone, 2# x 12 seated) Grasp and Release Theraputty - Flatten: green (green) Theraputty - Roll: green Theraputty - Grip: green Work Immunologist UBE (Upper Arm Bike): 3 min reverse 2.0 resistance Manual Therapy Manual Therapy: Myofascial  release Myofascial Release: 2:05 to 2:35   Goals Long Term Goals Long Term Goal 1: Decrease pain in her right shoulder and forearm to 1/10 while working on the computer Long Term Goal 1 Progress: Progressing toward goal Long Term Goal 2: Return to prior level of independence with all daily, work and leisure activivities. Long Term Goal 2 Progress: Progressing toward goal Long Term Goal 3: Decrease fascial restrictions to minimal in her right shuolder and forearm. Long Term Goal 3 Progress: Progressing toward goal Long Term Goal 4:  Increase right grip strength by 10 pounds and pinch strength by 6 pounds for increased independence with opening containers. Long Term Goal 4 Progress: Progressing toward goal End of Session Patient Active Problem List  Diagnoses  . FIBROIDS, UTERUS  . HYPERLIPIDEMIA  . OVERWEIGHT  . UNSPECIFIED VISUAL LOSS  . UNSPECIFIED ESSENTIAL HYPERTENSION  . Pain in joint, shoulder region  . Muscle weakness (generalized)   End of Session Activity Tolerance: Patient tolerated treatment well OT Assessment and Plan Clinical Impression Statement: increased to 2# with seated ex.  and 1# to w arms and x to v Prognosis: Good OT Frequency: Min 2X/week OT Duration: 6 weeks OT Treatment/Interventions: Therapeutic exercise;Therapeutic activities;Patient/family education;Manual therapy;Other (comment) (Modalities PRN) OT Plan: Add t-band to HEP  Visit 6 of 12  Tavionna Grout L 09/03/2010, 3:35 PM

## 2010-09-05 ENCOUNTER — Ambulatory Visit (HOSPITAL_COMMUNITY)
Admission: RE | Admit: 2010-09-05 | Discharge: 2010-09-05 | Disposition: A | Discharge status: Home / Self Care | Payer: No Typology Code available for payment source | Source: Ambulatory Visit | Attending: Family Medicine | Admitting: Family Medicine

## 2010-09-05 NOTE — Progress Notes (Signed)
Occupational Therapy Treatment  Patient Name: Faith Hood MRN: [id number] Today's Date: 09/05/2010    Time in:  9:16  Time out:  10:10  HPI: Symptoms/Limitations Symptoms: I picked up a bag of 1/2 gallon milk, ceral and orange juice and it hurts some this morning. Pain Assessment Currently in Pain?: Other (Comment) (no real pain just discomfort) Pain Location: Shoulder Pain Orientation: Right  Precautions/Restrictions    Mobility       Exercise/Treatments Cervical Exercises Shoulder Flexion: PROM;Supine;Prone;Other reps (comment);Right;Strengthening;Seated;15 reps (1# prone, 2# seated) Shoulder Extension: Strengthening;Other reps (comment);Prone (1# prone) Theraband Level (Shoulder Extension): Level 3 (Green) (x 15) Shoulder ABduction: PROM;Strengthening;Right;Other reps (comment);Seated;15 reps (2# seated) Shoulder Horizontal ABduction: Strengthening;Both;Right;Prone;Seated;PROM (1# prone 2#seated) Shoulder Retraction: Strengthening;Prone;Other (comment);Right;15 reps (1# prone, 2# seated) Theraband Level (Shoulder Retraction): Level 3 (Green) (15 reps) Theraband Level (Row): Level 3 (Green) (x15) Shoulder Internal Rotation: PROM;Strengthening;Right;Other reps (comment);Supine;Prone;Seated;15 reps (1# prone, 2# seated) Shoulder External Rotation: PROM;Strengthening;Right;Other reps (comment);Supine;Prone;Seated;15 reps (1# prone, 2# seated) Shoulder Exercises Shoulder Flexion: PROM;Supine;Prone;Other reps (comment);Right;Strengthening;Seated;15 reps (1# prone, 2# seated) Shoulder Extension: Strengthening;Other reps (comment);Prone (1# prone) Theraband Level (Shoulder Extension): Level 3 (Green) (x 15) Shoulder Retraction: Strengthening;Prone;Other (comment);Right;15 reps (1# prone, 2# seated) Theraband Level (Shoulder Retraction): Level 3 (Green) (15 reps) Shoulder Horizontal ABduction: Strengthening;Both;Right;Prone;Seated;PROM (1# prone 2#seated) Shoulder Horizontal ADduction:  PROM;Strengthening;Right;Other reps (comment);Seated Shoulder ABduction: PROM;Strengthening;Right;Other reps (comment);Seated;15 reps (2# seated) Shoulder External Rotation: PROM;Strengthening;Right;Other reps (comment);Supine;Prone;Seated;15 reps (1# prone, 2# seated) Shoulder Internal Rotation: PROM;Strengthening;Right;Other reps (comment);Supine;Prone;Seated;15 reps (1# prone, 2# seated) Theraband Level (Row): Level 3 (Green) (x15) Additional ROM/Strengthening Exercises Thumb Tacks: 3' Elevation/Depression - Shoulder Pro/Retraction: 3' "W" Arms: x 12 with 1# x to v 1# x 12 Additional Elbow Exercises Theraputty - Flatten: green Theraputty - Roll: green Theraputty - Grip: green Additional Wrist Exercises Theraputty - Flatten: green Theraputty - Roll: green Theraputty - Grip: green Hand Exercises Theraputty - Flatten: green Theraputty - Roll: green Theraputty - Grip: green Neurological Re-education Exercises Shoulder Flexion: PROM;Supine;Prone;Other reps (comment);Right;Strengthening;Seated;15 reps (1# prone, 2# seated) Shoulder ABduction: PROM;Strengthening;Right;Other reps (comment);Seated;15 reps (2# seated) Shoulder Horizontal ABduction: Strengthening;Both;Right;Prone;Seated;PROM (1# prone 2#seated) Shoulder External Rotation: PROM;Strengthening;Right;Other reps (comment);Supine;Prone;Seated;15 reps (1# prone, 2# seated) Shoulder Internal Rotation: PROM;Strengthening;Right;Other reps (comment);Supine;Prone;Seated;15 reps (1# prone, 2# seated) Grasp and Release Theraputty - Flatten: green Theraputty - Roll: green Theraputty - Grip: green Manual Therapy Manual Therapy: Myofascial release Myofascial Release: MFR from 9:16 to 9:31   Goals Long Term Goals Long Term Goal 1 Progress: Met Long Term Goal 2 Progress: Progressing toward goal Long Term Goal 3 Progress: Partly met Long Term Goal 4 Progress: Progressing toward goal End of Session Patient Active Problem List    Diagnoses  . FIBROIDS, UTERUS  . HYPERLIPIDEMIA  . OVERWEIGHT  . UNSPECIFIED VISUAL LOSS  . UNSPECIFIED ESSENTIAL HYPERTENSION  . Pain in joint, shoulder region  . Muscle weakness (generalized)   End of Session Activity Tolerance: Patient tolerated treatment well OT Assessment and Plan Clinical Impression Statement: increased reps with seated ex, increased t-band to green.  Added t-band to HEP. Prognosis: Good OT Plan: reassess the end of next week.   Theophilus Bones L 09/05/2010, 10:33 AM  Visit 7/12

## 2010-09-05 NOTE — Patient Instructions (Signed)
Patient instructed in tband HEP,  Pt given green tband and handout for scapula stab ex.

## 2010-09-08 ENCOUNTER — Ambulatory Visit (HOSPITAL_COMMUNITY)
Admission: RE | Admit: 2010-09-08 | Discharge: 2010-09-08 | Disposition: A | Discharge status: Home / Self Care | Payer: No Typology Code available for payment source | Source: Ambulatory Visit | Attending: Family Medicine | Admitting: Family Medicine

## 2010-09-08 NOTE — Progress Notes (Signed)
Occupational Therapy Treatment  Patient Name: Faith Hood MRN: [id number] Today's Date: 09/08/2010 Time in:  2:27-3:19  MFR and manuel stretching:  2:27 to 2:40 Reassess 09/12/2010  HPI: Symptoms/Limitations Symptoms: I am trying to be so careful with what I do now. Pain Assessment Pain Score:   1 Pain Location: Shoulder Pain Orientation: Right Pain Type: Chronic pain Pain Onset: More than a month ago Pain Frequency: Occasional Multiple Pain Sites: No  Precautions/Restrictions    Mobility       Exercise/Treatments Cervical Exercises Shoulder Flexion: PROM;Supine;Prone;Other reps (comment);Right;Strengthening;Seated;15 reps;10 reps (prone 1#) Bar Weights/Barbell (Shoulder Flexion): 1 lb;2 lbs (seated) Shoulder Extension: Strengthening;Prone;10 reps;Bar weights/barbell (1lb) Theraband Level (Shoulder Extension): Level 3 (Green) (x15) Shoulder ABduction: PROM;Supine;Strengthening;Seated;15 reps;Bar weights/barbell Bar Weights/Barbell (Shoulder Abduction): 2 lbs Shoulder Horizontal ABduction: PROM;Supine;Strengthening;Prone;10 reps;Seated;15 reps;Bar weights/barbell Bar Weights/Barbell (Shoulder Horizontal Abduction): 1 lb;2 lbs Shoulder Retraction: Strengthening;10 reps;Prone (1#) Theraband Level (Shoulder Retraction): Level 3 (Green) (x15) Theraband Level (Row): Level 3 (Green) Shoulder Internal Rotation: PROM;Strengthening;10 reps;15 reps;Supine;Prone;Seated;Bar weights/barbell Bar Weights/Barbell (Shoulder Internal Rotation): 1 lb;2 lbs Shoulder External Rotation: PROM;Supine;Strengthening;Prone;10 reps;Seated;15 reps;Bar weights/barbell Bar Weights/Barbell (Shoulder External Rotation): 1 lb;2 lbs UBE (Upper Arm Bike): 3 min reverse 2.0 resistance Shoulder Exercises Shoulder Flexion: PROM;Supine;Prone;Other reps (comment);Right;Strengthening;Seated;15 reps;10 reps (prone 1#) Bar Weights/Barbell (Shoulder Flexion): 1 lb;2 lbs (seated) Shoulder Extension:  Strengthening;Prone;10 reps;Bar weights/barbell (1lb) Theraband Level (Shoulder Extension): Level 3 (Green) (x15) Shoulder Retraction: Strengthening;10 reps;Prone (1#) Theraband Level (Shoulder Retraction): Level 3 (Green) (x15) Shoulder Protraction: Strengthening;15 reps;Seated Bar Weights/Barbell (Shoulder Protraction): 2 lbs Shoulder Horizontal ABduction: PROM;Supine;Strengthening;Prone;10 reps;Seated;15 reps;Bar weights/barbell Bar Weights/Barbell (Shoulder Horizontal Abduction): 1 lb;2 lbs Shoulder Horizontal ADduction: PROM;Supine;Strengthening;Seated;15 reps;Other (comment) (prone) Bar Weights/Barbell (Shoulder Horizontal Adduction): 1 lb;2 lbs Shoulder ABduction: PROM;Supine;Strengthening;Seated;15 reps;Bar weights/barbell Bar Weights/Barbell (Shoulder Abduction): 2 lbs Shoulder External Rotation: PROM;Supine;Strengthening;Prone;10 reps;Seated;15 reps;Bar weights/barbell Bar Weights/Barbell (Shoulder External Rotation): 1 lb;2 lbs Shoulder Internal Rotation: PROM;Strengthening;10 reps;15 reps;Supine;Prone;Seated;Bar weights/barbell Bar Weights/Barbell (Shoulder Internal Rotation): 1 lb;2 lbs Theraband Level (Row): Level 3 (Green) Additional ROM/Strengthening Exercises UBE (Upper Arm Bike): 3 min reverse 2.0 resistance Thumb Tacks: 3' Elevation/Depression - Shoulder Pro/Retraction: 3' "W" Arms: x 12 with 1# x to v 1# x 12 (x12 with 2LB weight) Additional Elbow Exercises UBE (Upper Arm Bike): 3 min reverse 2.0 resistance Theraputty - Flatten: green Theraputty - Roll: green Theraputty - Grip: green Additional Wrist Exercises Theraputty - Flatten: green Theraputty - Roll: green Theraputty - Grip: green Hand Exercises Theraputty - Flatten: green Theraputty - Roll: green Theraputty - Grip: green Neurological Re-education Exercises Shoulder Flexion: PROM;Supine;Prone;Other reps (comment);Right;Strengthening;Seated;15 reps;10 reps (prone 1#) Bar Weights/Barbell (Shoulder  Flexion): 1 lb;2 lbs (seated) Shoulder ABduction: PROM;Supine;Strengthening;Seated;15 reps;Bar weights/barbell Bar Weights/Barbell (Shoulder Abduction): 2 lbs Shoulder Protraction: Strengthening;15 reps;Seated Bar Weights/Barbell (Shoulder Protraction): 2 lbs Shoulder Horizontal ABduction: PROM;Supine;Strengthening;Prone;10 reps;Seated;15 reps;Bar weights/barbell Bar Weights/Barbell (Shoulder Horizontal Abduction): 1 lb;2 lbs Shoulder External Rotation: PROM;Supine;Strengthening;Prone;10 reps;Seated;15 reps;Bar weights/barbell Bar Weights/Barbell (Shoulder External Rotation): 1 lb;2 lbs Shoulder Internal Rotation: PROM;Strengthening;10 reps;15 reps;Supine;Prone;Seated;Bar weights/barbell Bar Weights/Barbell (Shoulder Internal Rotation): 1 lb;2 lbs Grasp and Release Theraputty - Flatten: green Theraputty - Roll: green Theraputty - Grip: green Work Immunologist UBE (Upper Arm Bike): 3 min reverse 2.0 resistance Manual Therapy Manual Therapy: Myofascial release Myofascial Release: MFR and manuel stretching to right arm and scapula area to decrease pain and increase rom.  done from 2:27-2:40   Goals Long Term Goals Long Term Goal 1 Progress: Progressing toward goal Long Term Goal 2 Progress: Progressing toward goal Long Term Goal 3 Progress: Partly met  Long Term Goal 4 Progress: Progressing toward goal End of Session Patient Active Problem List  Diagnoses  . FIBROIDS, UTERUS  . HYPERLIPIDEMIA  . OVERWEIGHT  . UNSPECIFIED VISUAL LOSS  . UNSPECIFIED ESSENTIAL HYPERTENSION  . Pain in joint, shoulder region  . Muscle weakness (generalized)   End of Session Activity Tolerance: Patient tolerated treatment well OT Assessment and Plan Clinical Impression Statement: decrease in restrictions felt today and increased ease with prone exercises Prognosis: Good OT Plan: Cont to increase strength and reassess 09/12/10   Vaughan Browner 09/08/2010, 4:22 PM

## 2010-09-10 ENCOUNTER — Ambulatory Visit (HOSPITAL_COMMUNITY)
Admission: RE | Admit: 2010-09-10 | Discharge: 2010-09-10 | Disposition: A | Discharge status: Home / Self Care | Payer: No Typology Code available for payment source | Source: Ambulatory Visit | Attending: Family Medicine | Admitting: Family Medicine

## 2010-09-10 NOTE — Progress Notes (Signed)
Occupational Therapy Treatment  Patient Name: SHANEECE STOCKBURGER MRN: [id number] Today's Date: 09/10/2010  Time in:  3:45  Time out 4:42 Visit 7/12 HPI: Symptoms/Limitations Symptoms: it has been a rough day Pain Assessment Currently in Pain?: Yes Pain Score:   2 Pain Orientation: Right Pain Type: Chronic pain Pain Onset: More than a month ago Pain Frequency: Intermittent Multiple Pain Sites: No  Precautions/Restrictions    Mobility       Exercise/Treatments Cervical Exercises Shoulder Flexion: PROM;Supine;Prone;Other reps (comment);Right;Strengthening;Seated;15 reps Bar Weights/Barbell (Shoulder Flexion): 1 lb;2 lbs Shoulder Extension: Strengthening;Prone;Bar weights/barbell;15 reps Shoulder ABduction: PROM;Supine;Strengthening;Seated;15 reps;Bar weights/barbell Bar Weights/Barbell (Shoulder Abduction): 2 lbs Shoulder Horizontal ABduction: PROM;Supine;Strengthening;Prone;Seated;15 reps;Bar weights/barbell Bar Weights/Barbell (Shoulder Horizontal Abduction): 2 lbs Shoulder Retraction: Strengthening;Prone;15 reps Theraband Level (Row): Level 3 (Green) Shoulder Internal Rotation: PROM;Strengthening;15 reps;Supine;Prone;Seated;Bar weights/barbell Bar Weights/Barbell (Shoulder Internal Rotation): 2 lbs Shoulder External Rotation: PROM;Supine;Strengthening;Prone;Seated;15 reps;Bar weights/barbell Bar Weights/Barbell (Shoulder External Rotation): 2 lbs UBE (Upper Arm Bike): 3 min reverse 2.0 resistance Shoulder Exercises Shoulder Flexion: PROM;Supine;Prone;Other reps (comment);Right;Strengthening;Seated;15 reps Bar Weights/Barbell (Shoulder Flexion): 1 lb;2 lbs Shoulder Extension: Strengthening;Prone;Bar weights/barbell;15 reps Shoulder Retraction: Strengthening;Prone;15 reps Shoulder Protraction: Strengthening;15 reps;Seated Bar Weights/Barbell (Shoulder Protraction): 2 lbs Shoulder Horizontal ABduction: PROM;Supine;Strengthening;Prone;Seated;15 reps;Bar weights/barbell Bar  Weights/Barbell (Shoulder Horizontal Abduction): 2 lbs Shoulder Horizontal ADduction: PROM;Supine;Strengthening;Seated;15 reps;Other (comment) Bar Weights/Barbell (Shoulder Horizontal Adduction): 2 lbs Shoulder ABduction: PROM;Supine;Strengthening;Seated;15 reps;Bar weights/barbell Bar Weights/Barbell (Shoulder Abduction): 2 lbs Shoulder External Rotation: PROM;Supine;Strengthening;Prone;Seated;15 reps;Bar weights/barbell Bar Weights/Barbell (Shoulder External Rotation): 2 lbs Shoulder Internal Rotation: PROM;Strengthening;15 reps;Supine;Prone;Seated;Bar weights/barbell Bar Weights/Barbell (Shoulder Internal Rotation): 2 lbs Theraband Level (Row): Level 3 (Green) Additional ROM/Strengthening Exercises UBE (Upper Arm Bike): 3 min reverse 2.0 resistance Thumb Tacks: 3' Elevation/Depression - Shoulder Pro/Retraction: 3' "W" Arms: x 12 with 1# x to v 1# x 12 Additional Elbow Exercises UBE (Upper Arm Bike): 3 min reverse 2.0 resistance Theraputty - Flatten: green Theraputty - Roll: green Theraputty - Grip: green Additional Wrist Exercises Theraputty - Flatten: green Theraputty - Roll: green Theraputty - Grip: green Hand Exercises Theraputty - Flatten: green Theraputty - Roll: green Theraputty - Grip: green Neurological Re-education Exercises Shoulder Flexion: PROM;Supine;Prone;Other reps (comment);Right;Strengthening;Seated;15 reps Bar Weights/Barbell (Shoulder Flexion): 1 lb;2 lbs Shoulder ABduction: PROM;Supine;Strengthening;Seated;15 reps;Bar weights/barbell Bar Weights/Barbell (Shoulder Abduction): 2 lbs Shoulder Protraction: Strengthening;15 reps;Seated Bar Weights/Barbell (Shoulder Protraction): 2 lbs Shoulder Horizontal ABduction: PROM;Supine;Strengthening;Prone;Seated;15 reps;Bar weights/barbell Bar Weights/Barbell (Shoulder Horizontal Abduction): 2 lbs Shoulder External Rotation: PROM;Supine;Strengthening;Prone;Seated;15 reps;Bar weights/barbell Bar Weights/Barbell (Shoulder  External Rotation): 2 lbs Shoulder Internal Rotation: PROM;Strengthening;15 reps;Supine;Prone;Seated;Bar weights/barbell Bar Weights/Barbell (Shoulder Internal Rotation): 2 lbs Grasp and Release Theraputty - Flatten: green Theraputty - Roll: green Theraputty - Grip: green Work Immunologist UBE (Upper Arm Bike): 3 min reverse 2.0 resistance Manual Therapy Myofascial Release: MFR and manuel stretching to right arm and scapula are to decrease pain and increase ROM  done from 4:27-4:42   Goals Long Term Goals Long Term Goal 1 Progress: Progressing toward goal Long Term Goal 2 Progress: Progressing toward goal Long Term Goal 3 Progress: Progressing toward goal Long Term Goal 4 Progress: Progressing toward goal Long Term Goal 5 Progress: Progressing toward goal End of Session Patient Active Problem List  Diagnoses  . FIBROIDS, UTERUS  . HYPERLIPIDEMIA  . OVERWEIGHT  . UNSPECIFIED VISUAL LOSS  . UNSPECIFIED ESSENTIAL HYPERTENSION  . Pain in joint, shoulder region  . Muscle weakness (generalized)   End of Session Activity Tolerance: Patient tolerated treatment well OT Assessment and Plan Clinical Impression Statement: d/c'd tband to hep Prognosis: Good OT Plan: increase reps/  reassess   Vaughan Browner 09/10/2010, 4:48 PM

## 2010-09-16 ENCOUNTER — Ambulatory Visit (HOSPITAL_COMMUNITY)
Admission: RE | Admit: 2010-09-16 | Discharge: 2010-09-16 | Disposition: A | Discharge status: Home / Self Care | Payer: No Typology Code available for payment source | Source: Ambulatory Visit | Attending: Family Medicine | Admitting: Family Medicine

## 2010-09-16 NOTE — Progress Notes (Signed)
Occupational Therapy Treatment  Patient Name: Faith Hood MRN: [id number] Today's Date: 09/16/2010  Time in: 3:33  Time out:  4:10  MFR and manuel stretching: 2:27 to 2:40  Reassess 09/30/2010   HPI: Symptoms/Limitations Symptoms: i had some numbness last night but it is better, i took it easy today. Pain Assessment Currently in Pain?: No/denies  Precautions/Restrictions    Mobility       Exercise/Treatments   Manual Therapy Manual Therapy: Myofascial release Myofascial Release: MFR and manuel stretching to right UE to decrease pain and increase ROM 3:33-3:49   Goals Long Term Goals Long Term Goal 1 Progress: Partly met Long Term Goal 2 Progress: Partly met Long Term Goal 3 Progress: Met Long Term Goal 4 Progress: Met End of Session Patient Active Problem List  Diagnoses  . FIBROIDS, UTERUS  . HYPERLIPIDEMIA  . OVERWEIGHT  . UNSPECIFIED VISUAL LOSS  . UNSPECIFIED ESSENTIAL HYPERTENSION  . Pain in joint, shoulder region  . Muscle weakness (generalized)   OT Assessment and Plan Clinical Impression Statement: see progress note Prognosis: Good OT Frequency: Min 2X/week OT Duration: 2 weeks OT Plan: continue 2x a week for 2 weeks   Noralee Stain, Shereda Graw L 09/16/2010, 7:26 PM

## 2010-09-19 ENCOUNTER — Ambulatory Visit (HOSPITAL_COMMUNITY)
Admission: RE | Admit: 2010-09-19 | Discharge: 2010-09-19 | Disposition: A | Discharge status: Home / Self Care | Payer: No Typology Code available for payment source | Source: Ambulatory Visit | Attending: Orthopedic Surgery | Admitting: Orthopedic Surgery

## 2010-09-19 NOTE — Progress Notes (Signed)
Occupational Therapy Treatment  Patient Name: Faith Hood MRN: [id number] Today's Date: 09/19/2010  Time In:   311          Time Out :406 Manual therapy 311-338 Therapeutic Exercise 338-406   Reassess on 09/30/10     HPI: Symptoms/Limitations Symptoms: I have been working around the house a lot today, it is numb a little more Pain Assessment Pain Score:   2 Pain Location: Shoulder Pain Orientation: Right Pain Type: Chronic pain Pain Frequency: Intermittent Multiple Pain Sites: No        Exercise/Treatments Cervical Exercises Shoulder Flexion: PROM;Supine;Strengthening;Right;Seated;Prone;15 reps Bar Weights/Barbell (Shoulder Flexion): 2 lbs Shoulder Extension: Strengthening;Prone;Bar weights/barbell;15 reps Bar Weights/Barbell (Shoulder Extension): 2 lbs (prone) Shoulder ABduction: PROM;Supine;Strengthening;Seated Bar Weights/Barbell (Shoulder Abduction): 2 lbs (seated and prone) Shoulder Horizontal ABduction: PROM;Supine;Strengthening;Right;15 reps;Seated;Prone Bar Weights/Barbell (Shoulder Horizontal Abduction): 2 lbs (seated and prone) Shoulder Retraction: Strengthening;Prone;15 reps (2lbs) Shoulder Internal Rotation: PROM;Strengthening;15 reps;Supine;Prone;Seated;Bar weights/barbell Bar Weights/Barbell (Shoulder Internal Rotation): 2 lbs (seated and prone) Shoulder External Rotation: PROM;Supine;Strengthening;Prone;Seated;15 reps;Bar weights/barbell Bar Weights/Barbell (Shoulder External Rotation): 2 lbs (seated and prone) UBE (Upper Arm Bike): 3 min reverse 3.0 resistance Shoulder Exercises Shoulder Flexion: PROM;Supine;Strengthening;Right;Seated;Prone;15 reps Bar Weights/Barbell (Shoulder Flexion): 2 lbs Shoulder Extension: Strengthening;Prone;Bar weights/barbell;15 reps Bar Weights/Barbell (Shoulder Extension): 2 lbs (prone) Shoulder Retraction: Strengthening;Prone;15 reps (2lbs) Shoulder Protraction: Strengthening;15 reps;Seated Bar Weights/Barbell (Shoulder  Protraction): 2 lbs (seated and prone) Shoulder Horizontal ABduction: PROM;Supine;Strengthening;Right;15 reps;Seated;Prone Bar Weights/Barbell (Shoulder Horizontal Abduction): 2 lbs (seated and prone) Shoulder Horizontal ADduction: PROM;Supine;Strengthening;Seated;15 reps;Other (comment) Bar Weights/Barbell (Shoulder Horizontal Adduction): 2 lbs (seated and prone) Shoulder ABduction: PROM;Supine;Strengthening;Seated Bar Weights/Barbell (Shoulder Abduction): 2 lbs (seated and prone) Shoulder External Rotation: PROM;Supine;Strengthening;Prone;Seated;15 reps;Bar weights/barbell Bar Weights/Barbell (Shoulder External Rotation): 2 lbs (seated and prone) Shoulder Internal Rotation: PROM;Strengthening;15 reps;Supine;Prone;Seated;Bar weights/barbell Bar Weights/Barbell (Shoulder Internal Rotation): 2 lbs (seated and prone) Additional ROM/Strengthening Exercises UBE (Upper Arm Bike): 3 min reverse 3.0 resistance Thumb Tacks: 3' Elevation/Depression - Shoulder Pro/Retraction: 3' "W" Arms: x 12 with 2# x to v 2# x 12 Additional Elbow Exercises UBE (Upper Arm Bike): 3 min reverse 3.0 resistance Theraputty:  (d/c) Theraputty - Flatten: d/c Theraputty - Roll: d/c Theraputty - Grip: d/c Additional Wrist Exercises Theraputty:  (d/c) Theraputty - Flatten: d/c Theraputty - Roll: d/c Theraputty - Grip: d/c Hand Exercises Theraputty:  (d/c) Theraputty - Flatten: d/c Theraputty - Roll: d/c Theraputty - Grip: d/c Neurological Re-education Exercises Shoulder Flexion: PROM;Supine;Strengthening;Right;Seated;Prone;15 reps Bar Weights/Barbell (Shoulder Flexion): 2 lbs Shoulder ABduction: PROM;Supine;Strengthening;Seated Bar Weights/Barbell (Shoulder Abduction): 2 lbs (seated and prone) Shoulder Protraction: Strengthening;15 reps;Seated Bar Weights/Barbell (Shoulder Protraction): 2 lbs (seated and prone) Shoulder Horizontal ABduction: PROM;Supine;Strengthening;Right;15 reps;Seated;Prone Bar  Weights/Barbell (Shoulder Horizontal Abduction): 2 lbs (seated and prone) Shoulder External Rotation: PROM;Supine;Strengthening;Prone;Seated;15 reps;Bar weights/barbell Bar Weights/Barbell (Shoulder External Rotation): 2 lbs (seated and prone) Shoulder Internal Rotation: PROM;Strengthening;15 reps;Supine;Prone;Seated;Bar weights/barbell Bar Weights/Barbell (Shoulder Internal Rotation): 2 lbs (seated and prone) Grasp and Release Theraputty - Flatten: d/c Theraputty - Roll: d/c Theraputty - Grip: d/c Work Hardening Exercises UBE (Upper Arm Bike): 3 min reverse 3.0 resistance Manual Therapy Manual Therapy: Myofascial release Myofascial Release: MFR and manuel stretching to right UE to decrease restrictions.  3:11-3:38   Goals Home Exercise Program PT Goal: Perform Home Exercise Program - Progress: Met Long Term Goals Long Term Goal 1 Progress: Partly met Long Term Goal 2 Progress: Partly met Long Term Goal 3 Progress: Met Long Term Goal 4 Progress: Met End of Session Patient Active Problem List  Diagnoses  . FIBROIDS, UTERUS  . HYPERLIPIDEMIA  .  OVERWEIGHT  . UNSPECIFIED VISUAL LOSS  . UNSPECIFIED ESSENTIAL HYPERTENSION  . Pain in joint, shoulder region  . Muscle weakness (generalized)   OT Assessment and Plan Clinical Impression Statement: d/c putty secondary to goals met and d/c tband to HEP Prognosis: Good OT Plan: continue to decrease pain to return to [employer], Hayleigh Bawa L 09/19/2010, 4:25 PM

## 2010-09-22 ENCOUNTER — Ambulatory Visit (HOSPITAL_COMMUNITY)
Admission: RE | Admit: 2010-09-22 | Discharge: 2010-09-22 | Disposition: A | Discharge status: Home / Self Care | Payer: No Typology Code available for payment source | Source: Ambulatory Visit | Attending: Family Medicine | Admitting: Family Medicine

## 2010-09-22 NOTE — Progress Notes (Signed)
Occupational Therapy Treatment  Patient Name: Faith Hood MRN: [id number] Today's Date: 09/22/2010 Time In: 310Time Out : 352 Manual therapy 310-327  Therapeutic Exercise 328-352 Reassess on 09/30/10  Visit 12 of 16 HPI: Symptoms/Limitations Symptoms: S:  Ive been hanging up bulletin boards at school and my shoulder is a little sore.   Pain Assessment Pain Score:   3 Pain Location: Shoulder Pain Orientation: Right  Precautions/Restrictions    Mobility       Exercise/Treatments Cervical Exercises Shoulder Flexion: Supine;PROM;10 reps;Strengthening;Seated;Prone (10 reps 3 pounds) Bar Weights/Barbell (Shoulder Flexion): 3 lbs Shoulder ABduction: PROM;Supine;Strengthening;Seated (and prone 10 reps 3 pounds) Bar Weights/Barbell (Shoulder Abduction): 3 lbs Shoulder Horizontal ABduction: Supine;PROM;Strengthening;Seated;Prone (10 reps 3 pounds) Bar Weights/Barbell (Shoulder Horizontal Abduction): 3 lbs Shoulder Internal Rotation: Supine;PROM;10 reps;Seated;Strengthening;Prone (strengthening 3 pounds 10 reps) Bar Weights/Barbell (Shoulder Internal Rotation): 3 lbs Shoulder External Rotation: Supine;PROM;Strengthening;Seated;Prone (10 reps with 3 pounds) UBE (Upper Arm Bike): 3 min reverse 3.0 resistance Shoulder Exercises Shoulder Flexion: Supine;PROM;10 reps;Strengthening;Seated;Prone (10 reps 3 pounds) Bar Weights/Barbell (Shoulder Flexion): 3 lbs Shoulder Protraction: Supine;PROM;Strengthening;Seated (prone 10 reps 3 pounds) Bar Weights/Barbell (Shoulder Protraction): 3 lbs Shoulder Horizontal ABduction: Supine;PROM;Strengthening;Seated;Prone (10 reps 3 pounds) Bar Weights/Barbell (Shoulder Horizontal Abduction): 3 lbs Shoulder ABduction: PROM;Supine;Strengthening;Seated (and prone 10 reps 3 pounds) Bar Weights/Barbell (Shoulder Abduction): 3 lbs Shoulder External Rotation: Supine;PROM;Strengthening;Seated;Prone (10 reps with 3 pounds) Shoulder Internal Rotation: Supine;PROM;10  reps;Seated;Strengthening;Prone (strengthening 3 pounds 10 reps) Bar Weights/Barbell (Shoulder Internal Rotation): 3 lbs Additional ROM/Strengthening Exercises UBE (Upper Arm Bike): 3 min reverse 3.0 resistance Cybex Press: 1 1/2 plates x 10 Cybex Row: 1 1/2 plates x 10 Thumb Tacks:  (1') Elevation/Depression - Shoulder Pro/Retraction:  (1') "W" Arms: 10 reps  with 3 pounds and "x to v" 10 reps with 3 pounds Additional Elbow Exercises UBE (Upper Arm Bike): 3 min reverse 3.0 resistance Cybex Press: 1 1/2 plates x 10 Cybex Row: 1 1/2 plates x 10 Neurological Re-education Exercises Shoulder Flexion: Supine;PROM;10 reps;Strengthening;Seated;Prone (10 reps 3 pounds) Bar Weights/Barbell (Shoulder Flexion): 3 lbs Shoulder ABduction: PROM;Supine;Strengthening;Seated (and prone 10 reps 3 pounds) Bar Weights/Barbell (Shoulder Abduction): 3 lbs Shoulder Protraction: Supine;PROM;Strengthening;Seated (prone 10 reps 3 pounds) Bar Weights/Barbell (Shoulder Protraction): 3 lbs Shoulder Horizontal ABduction: Supine;PROM;Strengthening;Seated;Prone (10 reps 3 pounds) Bar Weights/Barbell (Shoulder Horizontal Abduction): 3 lbs Shoulder External Rotation: Supine;PROM;Strengthening;Seated;Prone (10 reps with 3 pounds) Shoulder Internal Rotation: Supine;PROM;10 reps;Seated;Strengthening;Prone (strengthening 3 pounds 10 reps) Bar Weights/Barbell (Shoulder Internal Rotation): 3 lbs Work Immunologist UBE (Upper Arm Bike): 3 min reverse 3.0 resistance Manual Therapy Manual Therapy: Myofascial release Myofascial Release: MFR and manual stretching to right upper arm and scapular region to decrease pain and increase mobility. All exercises completed one time only.  Goals Home Exercise Program PT Goal: Perform Home Exercise Program - Progress: Met Long Term Goals Long Term Goal 1 Progress: Progressing toward goal Long Term Goal 2 Progress: Progressing toward goal Long Term Goal 3 Progress: Progressing  toward goal Long Term Goal 4 Progress: Progressing toward goal Long Term Goal 5 Progress: Progressing toward goal End of Session Patient Active Problem List  Diagnoses  . FIBROIDS, UTERUS  . HYPERLIPIDEMIA  . OVERWEIGHT  . UNSPECIFIED VISUAL LOSS  . UNSPECIFIED ESSENTIAL HYPERTENSION  . Pain in joint, shoulder region  . Muscle weakness (generalized)   End of Session Activity Tolerance: Patient tolerated treatment well General Behavior During Session: [facility] for tasks performed Cognition: [facility] for tasks performed OT Assessment and Plan Clinical Impression Statement: A:  Added cybex press and row to therapeutic exercises.  Increased to 3 pounds with seated and prone strengthening exercises. OT Plan: P:  Increase to 12 reps with seated and prone strengthening with 3 pounds.   Sondra Barges [place] 09/22/2010, 4:39 PM

## 2010-09-24 ENCOUNTER — Ambulatory Visit (HOSPITAL_COMMUNITY)
Admission: RE | Admit: 2010-09-24 | Discharge: 2010-09-24 | Disposition: A | Discharge status: Home / Self Care | Payer: No Typology Code available for payment source | Source: Ambulatory Visit | Attending: Family Medicine | Admitting: Family Medicine

## 2010-09-24 DIAGNOSIS — I10 Essential (primary) hypertension: Secondary | ICD-10-CM | POA: Insufficient documentation

## 2010-09-24 DIAGNOSIS — M542 Cervicalgia: Secondary | ICD-10-CM | POA: Insufficient documentation

## 2010-09-24 DIAGNOSIS — IMO0001 Reserved for inherently not codable concepts without codable children: Secondary | ICD-10-CM | POA: Insufficient documentation

## 2010-09-24 DIAGNOSIS — M25619 Stiffness of unspecified shoulder, not elsewhere classified: Secondary | ICD-10-CM | POA: Insufficient documentation

## 2010-09-24 DIAGNOSIS — M6281 Muscle weakness (generalized): Secondary | ICD-10-CM | POA: Insufficient documentation

## 2010-09-24 DIAGNOSIS — M79609 Pain in unspecified limb: Secondary | ICD-10-CM | POA: Insufficient documentation

## 2010-09-24 DIAGNOSIS — M25519 Pain in unspecified shoulder: Secondary | ICD-10-CM | POA: Insufficient documentation

## 2010-09-24 NOTE — Progress Notes (Signed)
Occupational Therapy Treatment  Patient Name: Faith Hood MRN: [id number] Today's Date: 09/24/2010 Time In :317 Time Out : 353 Manual therapy 317-331  Therapeutic Exercise 332-353  Reassess on 09/30/10  Visit 13 of 16  HPI: Symptoms/Limitations Symptoms: S:  I worked around the house and my shoulder feels ok. Pain Assessment Currently in Pain?: Yes Pain Score:   3 Pain Location: Shoulder Pain Orientation: Right  Precautions/Restrictions    Mobility       Exercise/Treatments Cervical Exercises Shoulder Flexion: Supine;PROM;10 reps;Strengthening;Seated;Prone (12 reps 3 pounds) Shoulder Extension: Prone;Strengthening (12 reps 3 pounds) Shoulder ABduction: PROM;Supine;Strengthening;Seated (3 pounds) Shoulder Horizontal ABduction: Supine;PROM;Strengthening;Seated;Prone (3 pounds 12 reps) Shoulder Internal Rotation: Supine;PROM;10 reps;Seated;Strengthening;Prone (12 reps 3 pounds) Shoulder External Rotation: Supine;PROM;Strengthening;Seated;Prone (12 reps 3 pounds) UBE (Upper Arm Bike): 3 min reverse 3.5 resistance Shoulder Exercises Shoulder Flexion: Supine;PROM;10 reps;Strengthening;Seated;Prone (12 reps 3 pounds) Shoulder Extension: Prone;Strengthening (12 reps 3 pounds) Shoulder Protraction: Supine;PROM;Strengthening;Seated (3 pounds 12 reps) Shoulder Horizontal ABduction: Supine;PROM;Strengthening;Seated;Prone (3 pounds 12 reps) Shoulder ABduction: PROM;Supine;Strengthening;Seated (3 pounds) Shoulder External Rotation: Supine;PROM;Strengthening;Seated;Prone (12 reps 3 pounds) Shoulder Internal Rotation: Supine;PROM;10 reps;Seated;Strengthening;Prone (12 reps 3 pounds) Additional ROM/Strengthening Exercises UBE (Upper Arm Bike): 3 min reverse 3.5 resistance Cybex Press: 1 1/2 plates x 15 Cybex Row: 1 1/2 plates x 15 Thumb Tacks: 1 min Elevation/Depression - Shoulder Pro/Retraction: "W" Arms: 12 reps with 3 pounds and "x to v" 12 reps with 3 pounds Additional Elbow  Exercises UBE (Upper Arm Bike): 3 min reverse 3.5 resistance Cybex Press: 1 1/2 plates x 15 Cybex Row: 1 1/2 plates x 15 Neurological Re-education Exercises Shoulder Flexion: Supine;PROM;10 reps;Strengthening;Seated;Prone (12 reps 3 pounds) Shoulder ABduction: PROM;Supine;Strengthening;Seated (3 pounds) Shoulder Protraction: Supine;PROM;Strengthening;Seated (3 pounds 12 reps) Shoulder Horizontal ABduction: Supine;PROM;Strengthening;Seated;Prone (3 pounds 12 reps) Shoulder External Rotation: Supine;PROM;Strengthening;Seated;Prone (12 reps 3 pounds) Shoulder Internal Rotation: Supine;PROM;10 reps;Seated;Strengthening;Prone (12 reps 3 pounds) Work Immunologist UBE (Upper Arm Bike): 3 min reverse 3.5 resistance Manual Therapy Manual Therapy: Myofascial release Myofascial Release: MFR and manual stretching to right upper arm and scapular region to decrease pain and increase mobiility in right shoulder and neck region. Exercises completed one time only.   Goals Long Term Goals Long Term Goal 1 Progress: Progressing toward goal Long Term Goal 2 Progress: Progressing toward goal Long Term Goal 3 Progress: Progressing toward goal Long Term Goal 4 Progress: Progressing toward goal End of Session Patient Active Problem List  Diagnoses  . FIBROIDS, UTERUS  . HYPERLIPIDEMIA  . OVERWEIGHT  . UNSPECIFIED VISUAL LOSS  . UNSPECIFIED ESSENTIAL HYPERTENSION  . Pain in joint, shoulder region  . Muscle weakness (generalized)   End of Session Activity Tolerance: Patient tolerated treatment well General Behavior During Session: [facility] for tasks performed Cognition: [facility] for tasks performed OT Assessment and Plan Clinical Impression Statement: A:  Increased to 12 reps with strengthening exercises.  Increased to 3.5 with UBE.  Added cervical AROM to HEP. OT Plan: P:  Increase to 15 reps with strengthening exercises, follow up on Cervical AROM HEP compliance and deep tissue massage with tennis  ball.    Sondra Barges [place] 09/24/2010, 3:59 PM

## 2010-09-29 ENCOUNTER — Ambulatory Visit (HOSPITAL_COMMUNITY)
Admission: RE | Admit: 2010-09-29 | Discharge: 2010-09-29 | Disposition: A | Discharge status: Home / Self Care | Payer: No Typology Code available for payment source | Source: Ambulatory Visit | Attending: Family Medicine | Admitting: Family Medicine

## 2010-09-29 NOTE — Progress Notes (Signed)
Occupational Therapy Treatment  Patient Name: Faith Hood MRN: [id number] Time In : 318 Time Out : 400  Manual therapy 318-332  Therapeutic Exercise 333-400  Reassess on 09/30/10  Visit 14 of 16 Today's Date: 09/29/2010  HPI: Symptoms/Limitations Symptoms: Carrying my lap top today was a bit painful.  Pain Assessment Currently in Pain?: Yes Pain Score:   1 Pain Location: Shoulder Pain Orientation: Right Pain Type: Acute pain        Exercise/Treatments Shoulder Exercises Shoulder Flexion: Supine;PROM;Seated;Prone;15 reps (3 pounds) Shoulder Extension: Prone;15 reps (3 pounds) Shoulder Protraction: Supine;PROM;10 reps;Seated (pounds) Shoulder Horizontal ABduction: Supine;PROM;10 reps;Seated;Prone;15 reps (3 pounds) Shoulder ABduction: PROM;10 reps;Seated;15 reps (3 pounds) Shoulder External Rotation: Supine;PROM;10 reps;Seated;Prone;15 reps ( pounds) Shoulder Internal Rotation: Supine;PROM;Seated;Prone;15 reps (3 pounds) Additional ROM/Strengthening Exercises UBE (Upper Arm Bike): 3 min reverse 3.5 resistance Cybex Press: 2 plates x 10 Cybex Row: 2 plates x 10 Thumb Tacks: 1 min Elevation/Depression - Shoulder Pro/Retraction: "W" Arms: 15 reps with 3 pounds and "x to v" 15 reps with 3 pounds Manual Therapy Manual Therapy: Myofascial release Myofascial Release: MFR and manual stretching to right upper arm and scapular region to decrease pain and incrase mobility in right shoulder and neck region.     Goals Long Term Goals Long Term Goal 1 Progress: Progressing toward goal Long Term Goal 2 Progress: Progressing toward goal Long Term Goal 3 Progress: Progressing toward goal Long Term Goal 4 Progress: Progressing toward goal Long Term Goal 5 Progress: Progressing toward goal End of Session Patient Active Problem List  Diagnoses  . FIBROIDS, UTERUS  . HYPERLIPIDEMIA  . OVERWEIGHT  . UNSPECIFIED VISUAL LOSS  . UNSPECIFIED ESSENTIAL HYPERTENSION  . Pain in joint,  shoulder region  . Muscle weakness (generalized)   End of Session Activity Tolerance: Patient tolerated treatment well General Behavior During Session: [facility] for tasks performed OT Assessment and Plan Clinical Impression Statement: A:  Increased to 15 reps with strengthening exercises.  Patient more fatigued this date secondary to returning to work.   OT Plan: P:  Reassess for possible dc.   Sondra Barges [place] 09/29/2010, 4:19 PM

## 2010-10-14 ENCOUNTER — Encounter: Payer: Self-pay | Admitting: Family Medicine

## 2010-10-15 ENCOUNTER — Encounter: Payer: Self-pay | Admitting: Family Medicine

## 2010-10-15 ENCOUNTER — Ambulatory Visit (INDEPENDENT_AMBULATORY_CARE_PROVIDER_SITE_OTHER): Payer: BC Managed Care – PPO | Admitting: Family Medicine

## 2010-10-15 VITALS — BP 140/90 | HR 68 | Resp 16 | Ht 64.5 in | Wt 168.0 lb

## 2010-10-15 DIAGNOSIS — R5383 Other fatigue: Secondary | ICD-10-CM

## 2010-10-15 DIAGNOSIS — I10 Essential (primary) hypertension: Secondary | ICD-10-CM

## 2010-10-15 DIAGNOSIS — M25519 Pain in unspecified shoulder: Secondary | ICD-10-CM

## 2010-10-15 DIAGNOSIS — E785 Hyperlipidemia, unspecified: Secondary | ICD-10-CM

## 2010-10-15 DIAGNOSIS — Z13820 Encounter for screening for osteoporosis: Secondary | ICD-10-CM

## 2010-10-15 MED ORDER — AMLODIPINE BESYLATE 5 MG PO TABS: 5.0000 mg | ORAL_TABLET | Freq: Every day | ORAL | Status: DC

## 2010-10-15 MED ORDER — IBUPROFEN 800 MG PO TABS: 800.0000 mg | ORAL_TABLET | Freq: Three times a day (TID) | ORAL | Status: DC | PRN

## 2010-10-15 NOTE — Patient Instructions (Addendum)
F/u in early to  November  Dose increase in amlodipine to 5mg  one daily, take tWO 2.5 mg tabs daily until done, bP is still too high   It is important that you exercise regularly at least 30 minutes 5 times a week. If you develop chest pain, have severe difficulty breathing, or feel very tired, stop exercising immediately and seek medical attention    A healthy diet is rich in fruit, vegetables and whole grains. Poultry fish, nuts and beans are a healthy choice for protein rather then red meat. A low sodium diet and drinking 64 ounces of water daily is generally recommended. Oils and sweet should be limited. Carbohydrates especially for those who are diabetic or overweight, should be limited to 34-45 gram per meal. It is important to eat on a regular schedule, at least 3 times daily. Snacks should be primarily fruits, vegetables or nuts.  LABWORK  NEEDS TO BE DONE BETWEEN 3 TO 7 DAYS BEFORE YOUR NEXT SCEDULED  VISIT.  THIS WILL IMPROVE THE QUALITY OF YOUR CARE.

## 2010-10-27 NOTE — Assessment & Plan Note (Signed)
Medication compliance addressed. Commitment to regular exercise and healthy  food choices, with portion control discussed. DASH diet and low fat diet discussed and literature offered. Changes in medication made at this visit.  

## 2010-10-27 NOTE — Progress Notes (Signed)
  Subjective:    Patient ID: Faith Hood, female    DOB: 1955/07/29, 55 y.o.   MRN: [id number]  HPI The PT is here for follow up and re-evaluation of chronic medical conditions specifically uncontrolled hypertension, medication management and review of any available recent lab and radiology data.  Preventive health is updated, specifically  Cancer screening and Immunization.   Questions or concerns regarding consultations or procedures which the PT has had in the interim are  addressed. The PT denies any adverse reactions to current medications since the last visit.  There are no new concerns.  There are no specific complaints       Review of Systems Denies recent fever or chills. Denies sinus pressure, nasal congestion, ear pain or sore throat. Denies chest congestion, productive cough or wheezing. Denies chest pains, palpitations and leg swelling Denies abdominal pain, nausea, vomiting,diarrhea or constipation.   Denies dysuria, frequency, hesitancy or incontinence. Denies joint pain, swelling and limitation in mobility. Denies headaches, seizures, numbness, or tingling. Denies depression, anxiety or insomnia. Denies skin break down or rash.        Objective:   Physical Exam Patient alert and oriented and in no cardiopulmonary distress.  HEENT: No facial asymmetry, EOMI, no sinus tenderness,  oropharynx pink and moist.  Neck supple no adenopathy.  Chest: Clear to auscultation bilaterally.  CVS: S1, S2 no murmurs, no S3.  ABD: Soft non tender. Bowel sounds normal.  Ext: No edema  MS: Adequate ROM spine, shoulders, hips and knees.  Skin: Intact, no ulcerations or rash noted.  Psych: Good eye contact, normal affect. Memory intact not anxious or depressed appearing.  CNS: CN 2-12 intact, power, tone and sensation normal throughout.        Assessment & Plan:

## 2010-10-28 ENCOUNTER — Other Ambulatory Visit: Payer: Self-pay | Admitting: Family Medicine

## 2010-11-24 ENCOUNTER — Telehealth: Payer: Self-pay | Admitting: Family Medicine

## 2010-11-25 NOTE — Telephone Encounter (Signed)
Called patient left message

## 2010-11-26 NOTE — Telephone Encounter (Signed)
She can stop the BP medication. Make an appt for next week for her BP. She was on a very low dose of Norvasc

## 2010-11-26 NOTE — Telephone Encounter (Signed)
At her last visit, her BP med was increased and she started getting a hive-like itchy rash. It recently went away but left some scar spots on her face around her nose and forehead. Still has occasional itching and was wanting to know if she was allergic and needed to stop it or if its ok to continue.

## 2010-12-24 ENCOUNTER — Encounter: Payer: Self-pay | Admitting: Family Medicine

## 2010-12-29 ENCOUNTER — Ambulatory Visit (INDEPENDENT_AMBULATORY_CARE_PROVIDER_SITE_OTHER): Payer: BC Managed Care – PPO | Admitting: Family Medicine

## 2010-12-29 ENCOUNTER — Encounter: Payer: Self-pay | Admitting: Family Medicine

## 2010-12-29 VITALS — BP 150/90 | HR 67 | Resp 16 | Ht 64.5 in | Wt 167.4 lb

## 2010-12-29 DIAGNOSIS — Z23 Encounter for immunization: Secondary | ICD-10-CM

## 2010-12-29 DIAGNOSIS — I10 Essential (primary) hypertension: Secondary | ICD-10-CM

## 2010-12-29 DIAGNOSIS — E785 Hyperlipidemia, unspecified: Secondary | ICD-10-CM

## 2010-12-29 MED ORDER — AMLODIPINE BESYLATE 10 MG PO TABS
10.0000 mg | ORAL_TABLET | Freq: Every day | ORAL | Status: DC
Start: 2010-12-29 — End: 2012-01-17

## 2010-12-29 MED ORDER — AMLODIPINE BESYLATE 10 MG PO TABS: 10.0000 mg | ORAL_TABLET | Freq: Every day | ORAL | Status: DC

## 2010-12-29 NOTE — Progress Notes (Signed)
  Subjective:    Patient ID: Faith Hood, female    DOB: 09/24/1955, 55 y.o.   MRN: [id number]  HPI The PT is here for follow up and re-evaluation of chronic medical conditions, medication management and review of any available recent lab and radiology data.  Preventive health is updated, specifically  Cancer screening and Immunization.   Questions or concerns regarding consultations or procedures which the PT has had in the interim are  addressed. The PT denies any adverse reactions to current medications since the last visit.  C/o dizziness on changing position at times and swimmy headedness in the past 2 weeks    Review of Systems See HPI Denies recent fever or chills. Denies sinus pressure, nasal congestion, ear pain or sore throat. Denies chest congestion, productive cough or wheezing. Denies chest pains, palpitations and leg swelling Denies abdominal pain, nausea, vomiting,diarrhea or constipation.   Denies dysuria, frequency, hesitancy or incontinence. Denies joint pain, swelling and limitation in mobility. Denies headaches, seizures, numbness, or tingling. Denies depression, anxiety or insomnia. Denies skin break down or rash.        Objective:   Physical Exam Patient alert and oriented and in no cardiopulmonary distress.  HEENT: No facial asymmetry, EOMI, no sinus tenderness,  oropharynx pink and moist.  Neck supple no adenopathy.  Chest: Clear to auscultation bilaterally.  CVS: S1, S2 no murmurs, no S3.  ABD: Soft non tender. Bowel sounds normal.  Ext: No edema  MS: Adequate ROM spine, shoulders, hips and knees.  Skin: Intact, no ulcerations or rash noted.  Psych: Good eye contact, normal affect. Memory intact not anxious or depressed appearing.  CNS: CN 2-12 intact, power, tone and sensation normal throughout.        Assessment & Plan:

## 2010-12-29 NOTE — Patient Instructions (Addendum)
cPE December 29 or after.  Dose increase in amlodipine to 10 mg one daily, ok to take tWO 5 mg tablets till done   Continue hyzaar as before.  Please get fasting labs as soon as possible  It is important that you exercise regularly at least 30 minutes 5 times a week. If you develop chest pain, have severe difficulty breathing, or feel very tired, stop exercising immediately and seek medical attention    A healthy diet is rich in fruit, vegetables and whole grains. Poultry fish, nuts and beans are a healthy choice for protein rather then red meat. A low sodium diet and drinking 64 ounces of water daily is generally recommended. Oils and sweet should be limited. Carbohydrates especially for those who are diabetic or overweight, should be limited to 30-45 gram per meal. It is important to eat on a regular schedule, at least 3 times daily. Snacks should be primarily fruits, vegetables or nuts.

## 2010-12-30 ENCOUNTER — Other Ambulatory Visit: Payer: Self-pay | Admitting: Family Medicine

## 2010-12-30 DIAGNOSIS — Z139 Encounter for screening, unspecified: Secondary | ICD-10-CM

## 2011-01-04 NOTE — Assessment & Plan Note (Signed)
Uncontrolled dose increase in norvasc, this may account for c/o dizziness

## 2011-01-04 NOTE — Assessment & Plan Note (Signed)
Low fat diet discussed and encouraged, also the significance of elevated LDL in increasing cardiovascular risk. rept labs prior to next visit

## 2011-01-12 ENCOUNTER — Ambulatory Visit (HOSPITAL_COMMUNITY)
Admission: RE | Admit: 2011-01-12 | Discharge: 2011-01-12 | Disposition: A | Discharge status: Home / Self Care | Payer: No Typology Code available for payment source | Source: Ambulatory Visit | Attending: Family Medicine | Admitting: Family Medicine

## 2011-01-12 DIAGNOSIS — Z1231 Encounter for screening mammogram for malignant neoplasm of breast: Secondary | ICD-10-CM | POA: Insufficient documentation

## 2011-01-12 DIAGNOSIS — Z139 Encounter for screening, unspecified: Secondary | ICD-10-CM

## 2011-01-24 LAB — CBC WITH DIFFERENTIAL/PLATELET
Basophils Absolute: 0 10*3/uL (ref 0.0–0.1)
HCT: 36.6 % (ref 36.0–46.0)
Lymphocytes Relative: 31 % (ref 12–46)
Neutro Abs: 3.4 10*3/uL (ref 1.7–7.7)
Platelets: 248 10*3/uL (ref 150–400)
RDW: 14.2 % (ref 11.5–15.5)
WBC: 5.5 10*3/uL (ref 4.0–10.5)

## 2011-01-24 LAB — BASIC METABOLIC PANEL
Chloride: 105 mEq/L (ref 96–112)
Creat: 0.8 mg/dL (ref 0.50–1.10)
Glucose, Bld: 85 mg/dL (ref 70–99)

## 2011-01-24 LAB — LIPID PANEL
LDL Cholesterol: 130 mg/dL — ABNORMAL HIGH (ref 0–99)
Total CHOL/HDL Ratio: 4.6 Ratio
VLDL: 14 mg/dL (ref 0–40)

## 2011-01-24 LAB — TSH: TSH: 1.975 u[IU]/mL (ref 0.350–4.500)

## 2011-03-26 ENCOUNTER — Other Ambulatory Visit: Payer: Self-pay | Admitting: Family Medicine

## 2011-04-09 ENCOUNTER — Other Ambulatory Visit (HOSPITAL_COMMUNITY)
Admission: RE | Admit: 2011-04-09 | Discharge: 2011-04-09 | Disposition: A | Discharge status: Home / Self Care | Payer: BC Managed Care – PPO | Source: Ambulatory Visit | Attending: Family Medicine | Admitting: Family Medicine

## 2011-04-09 ENCOUNTER — Ambulatory Visit (INDEPENDENT_AMBULATORY_CARE_PROVIDER_SITE_OTHER): Payer: No Typology Code available for payment source | Admitting: Family Medicine

## 2011-04-09 ENCOUNTER — Encounter: Payer: Self-pay | Admitting: Family Medicine

## 2011-04-09 DIAGNOSIS — E663 Overweight: Secondary | ICD-10-CM

## 2011-04-09 DIAGNOSIS — E785 Hyperlipidemia, unspecified: Secondary | ICD-10-CM

## 2011-04-09 DIAGNOSIS — Z0000 Encounter for general adult medical examination without abnormal findings: Secondary | ICD-10-CM

## 2011-04-09 DIAGNOSIS — Z01419 Encounter for gynecological examination (general) (routine) without abnormal findings: Secondary | ICD-10-CM | POA: Insufficient documentation

## 2011-04-09 DIAGNOSIS — I10 Essential (primary) hypertension: Secondary | ICD-10-CM

## 2011-04-09 LAB — POC HEMOCCULT BLD/STL (OFFICE/1-CARD/DIAGNOSTIC): Fecal Occult Blood, POC: NEGATIVE

## 2011-04-09 NOTE — Patient Instructions (Signed)
F/u in October  Fasting lipid and chem 7 and vit D in October  It is important that you exercise regularly at least 30 minutes 5 times a week. If you develop chest pain, have severe difficulty breathing, or feel very tired, stop exercising immediately and seek medical attention    A healthy diet is rich in fruit, vegetables and whole grains. Poultry fish, nuts and beans are a healthy choice for protein rather then red meat. A low sodium diet and drinking 64 ounces of water daily is generally recommended. Oils and sweet should be limited. Carbohydrates especially for those who are diabetic or overweight, should be limited to 30-45 gram per meal. It is important to eat on a regular schedule, at least 3 times daily. Snacks should be primarily fruits, vegetables or nuts.  Blood pressure is great, no med change

## 2011-04-11 NOTE — Progress Notes (Signed)
  Subjective:    Patient ID: Faith Hood, female    DOB: Feb 01, 1956, 56 y.o.   MRN: [id number]  HPI The PT is here forannual exam  and re-evaluation of chronic medical conditions, medication management and review of any available recent lab and radiology data.  Preventive health is updated, specifically  Cancer screening and Immunization.   Questions or concerns regarding consultations or procedures which the PT has had in the interim are  addressed. The PT denies any adverse reactions to current medications since the last visit.  There are no new concerns.  There are no specific complaints       Review of Systems See HPI Denies recent fever or chills. Denies sinus pressure, nasal congestion, ear pain or sore throat. Denies chest congestion, productive cough or wheezing. Denies chest pains, palpitations and leg swelling Denies abdominal pain, nausea, vomiting,diarrhea or constipation.   Denies dysuria, frequency, hesitancy or incontinence. Denies joint pain, swelling and limitation in mobility. Denies headaches, seizures, numbness, or tingling. Denies depression, anxiety or insomnia. Denies skin break down or rash.        Objective:   Physical Exam Pleasant well nourished female, alert and oriented x 3, in no cardio-pulmonary distress. Afebrile. HEENT No facial trauma or asymetry. Sinuses non tender.  EOMI, PERTL, fundoscopic exam is normal, no hemorhage or exudate.  External ears normal, tympanic membranes clear. Oropharynx moist, no exudate, good dentition. Neck: supple, no adenopathy,JVD or thyromegaly.No bruits.  Chest: Clear to ascultation bilaterally.No crackles or wheezes. Non tender to palpation  Breast: No asymetry,no masses. No nipple discharge or inversion. No axillary or supraclavicular adenopathy  Cardiovascular system; Heart sounds normal,  S1 and  S2 ,no S3.  No murmur, or thrill. Apical beat not displaced Peripheral pulses  normal.  Abdomen: Soft, non tender, no organomegaly or masses. No bruits. Bowel sounds normal. No guarding, tenderness or rebound.  Rectal:  No mass. Guaiac negative stool.  GU: External genitalia normal. No lesions. Vaginal canal normal.No discharge. Uterus enlarged, no adnexal masses, no cervical motion or adnexal tenderness.  Musculoskeletal exam: Full ROM of spine, hips , shoulders and knees. No deformity ,swelling or crepitus noted. No muscle wasting or atrophy.   Neurologic: Cranial nerves 2 to 12 intact. Power, tone ,sensation and reflexes normal throughout. No disturbance in gait. No tremor.  Skin: Intact, no ulceration, erythema , scaling or rash noted. Pigmentation normal throughout  Psych; Normal mood and affect. Judgement and concentration normal        Assessment & Plan:

## 2011-04-11 NOTE — Assessment & Plan Note (Signed)
Improved. Pt applauded on succesful weight loss through lifestyle change, and encouraged to continue same. Weight loss goal set for the next several months.  

## 2011-04-11 NOTE — Assessment & Plan Note (Signed)
Hyperlipidemia:Low fat diet discussed and encouraged.   

## 2011-04-11 NOTE — Assessment & Plan Note (Signed)
Controlled, no change in medication  

## 2011-04-14 ENCOUNTER — Telehealth: Payer: Self-pay | Admitting: Family Medicine

## 2011-04-14 DIAGNOSIS — M25519 Pain in unspecified shoulder: Secondary | ICD-10-CM

## 2011-04-14 MED ORDER — IBUPROFEN 800 MG PO TABS: 800.0000 mg | ORAL_TABLET | Freq: Three times a day (TID) | ORAL | Status: AC | PRN

## 2011-04-14 NOTE — Telephone Encounter (Signed)
Sent in refill as requested 

## 2011-04-22 DIAGNOSIS — B9789 Other viral agents as the cause of diseases classified elsewhere: Secondary | ICD-10-CM | POA: Insufficient documentation

## 2011-04-22 DIAGNOSIS — R059 Cough, unspecified: Secondary | ICD-10-CM | POA: Insufficient documentation

## 2011-04-22 DIAGNOSIS — IMO0001 Reserved for inherently not codable concepts without codable children: Secondary | ICD-10-CM | POA: Insufficient documentation

## 2011-04-22 DIAGNOSIS — J029 Acute pharyngitis, unspecified: Secondary | ICD-10-CM | POA: Insufficient documentation

## 2011-04-22 DIAGNOSIS — R509 Fever, unspecified: Secondary | ICD-10-CM | POA: Insufficient documentation

## 2011-04-22 DIAGNOSIS — J3489 Other specified disorders of nose and nasal sinuses: Secondary | ICD-10-CM | POA: Insufficient documentation

## 2011-04-22 DIAGNOSIS — I10 Essential (primary) hypertension: Secondary | ICD-10-CM | POA: Insufficient documentation

## 2011-04-22 DIAGNOSIS — R05 Cough: Secondary | ICD-10-CM | POA: Insufficient documentation

## 2011-04-23 ENCOUNTER — Emergency Department (HOSPITAL_COMMUNITY)
Admission: EM | Admit: 2011-04-23 | Discharge: 2011-04-23 | Disposition: A | Discharge status: Home Health | Payer: BC Managed Care – PPO | Attending: Emergency Medicine | Admitting: Emergency Medicine

## 2011-04-23 ENCOUNTER — Encounter (HOSPITAL_COMMUNITY): Payer: Self-pay | Admitting: *Deleted

## 2011-04-23 ENCOUNTER — Emergency Department (HOSPITAL_COMMUNITY): Payer: BC Managed Care – PPO

## 2011-04-23 DIAGNOSIS — B349 Viral infection, unspecified: Secondary | ICD-10-CM

## 2011-04-23 MED ORDER — IBUPROFEN 800 MG PO TABS
800.0000 mg | ORAL_TABLET | Freq: Once | ORAL | Status: AC
  Administered 2011-04-23: 800 mg via ORAL
  Filled 2011-04-23: qty 1

## 2011-04-23 NOTE — Discharge Instructions (Signed)
Viral Syndrome You or your child has Viral Syndrome. It is the most common infection causing "colds" and infections in the nose, throat, sinuses, and breathing tubes. Sometimes the infection causes nausea, vomiting, or diarrhea. The germ that causes the infection is a virus. No antibiotic or other medicine will kill it. There are medicines that you can take to make you or your child more comfortable.  HOME CARE INSTRUCTIONS   Rest in bed until you start to feel better.   If you have diarrhea or vomiting, eat small amounts of crackers and toast. Soup is helpful.   Do not give aspirin or medicine that contains aspirin to children.   Only take over-the-counter or prescription medicines for pain, discomfort, or fever as directed by your caregiver.  SEEK IMMEDIATE MEDICAL CARE IF:   You or your child has not improved within one week.   You or your child has pain that is not at least partially relieved by over-the-counter medicine.   Thick, colored mucus or blood is coughed up.   Discharge from the nose becomes thick yellow or green.   Diarrhea or vomiting gets worse.   There is any major change in your or your child's condition.   You or your child develops a skin rash, stiff neck, severe headache, or are unable to hold down food or fluid.   You or your child has an oral temperature above 102 F (38.9 C), not controlled by medicine.   Your baby is older than 3 months with a rectal temperature of 102 F (38.9 C) or higher.   Your baby is 3 months old or younger with a rectal temperature of 100.4 F (38 C) or higher.  Document Released: 01/25/2006 Document Revised: 10/22/2010 Document Reviewed: 01/26/2007 [employer] Patient Information 2012 [employer]. 

## 2011-04-23 NOTE — ED Provider Notes (Signed)
Medical screening examination/treatment/procedure(s) were conducted as a shared visit with non-physician practitioner(s) and myself.  I personally evaluated the patient during the encounter  The patient is well-appearing.  I suspect this is a viral syndrome.  Patient is tolerating oral fluids.  Her abdominal exam is benign.  Her vital signs are normal.  DC home in good condition PCP followup  Lyanne Co, MD 04/23/11 [phone]

## 2011-04-23 NOTE — ED Notes (Signed)
Pt reports flu-like s&s x 2 days, generalized body aches, fever, sore throat

## 2011-04-23 NOTE — ED Provider Notes (Signed)
History     CSN: [id number]  Arrival date & time 04/22/11  2324   First MD Initiated Contact with Patient 04/23/11 [phone]      Chief Complaint  Patient presents with  . Influenza    (Consider location/radiation/quality/duration/timing/severity/associated sxs/prior treatment) HPI Comments: Patient complains of sudden onset of flulike symptoms 2 days ago. She complains of generalized body aches, fever, chills, sore throat and occasional cough. She states she has some nasal congestion and has been blowing mucus from her nose for 2 days. She states she has been taking Advil for her symptoms but has not taken any today. She states she did take one Benadryl tablet earlier today.  She denies chest pain, dyspnea, abdominal pain, or vomiting.  She states that she did receive a influenza vaccine this season.   Patient is a 56 y.o. female presenting with flu symptoms. The history is provided by the patient. No language interpreter was used.  Influenza This is a new problem. Episode onset: 2 days ago. The problem occurs constantly. The problem has been unchanged. Associated symptoms include chills, congestion, coughing, a fever, myalgias and a sore throat. Pertinent negatives include no abdominal pain, anorexia, change in bowel habit, chest pain, diaphoresis, headaches, joint swelling, nausea, neck pain, numbness, rash, swollen glands, urinary symptoms, visual change, vomiting or weakness. The symptoms are aggravated by nothing. She has tried NSAIDs for the symptoms. The treatment provided no relief.    Past Medical History  Diagnosis Date  . Hypertension     Past Surgical History  Procedure Date  . Laprascopy for gynae probs 1978    Family History  Problem Relation Age of Onset  . Pancreatic cancer Mother 32    Diagnosed in 2009; currently in hospice pt. htn  . Cancer Mother     pancreas  . Hypertension Mother   . Dementia Father 86    brain tumor  . Hypertension Sister     4 siblings  all  girls  2 are hypertensive  . Cancer      MGM side cervical cancer /MGM side  stomach cancer   . Hypertension Sister   . Hypertension Sister   . Hypertension Sister     History  Substance Use Topics  . Smoking status: Never Smoker   . Smokeless tobacco: Never Used  . Alcohol Use: No     Never Drink    OB History    Grav Para Term Preterm Abortions TAB SAB Ect Mult Living                  Review of Systems  Constitutional: Positive for fever and chills. Negative for diaphoresis, activity change and appetite change.  HENT: Positive for congestion, sore throat and rhinorrhea. Negative for facial swelling, trouble swallowing, neck pain and neck stiffness.   Respiratory: Positive for cough. Negative for chest tightness, shortness of breath and wheezing.   Cardiovascular: Negative for chest pain and palpitations.  Gastrointestinal: Negative for nausea, vomiting, abdominal pain, diarrhea, anorexia and change in bowel habit.  Genitourinary: Negative for dysuria and difficulty urinating.  Musculoskeletal: Positive for myalgias. Negative for joint swelling.  Skin: Negative.  Negative for rash.  Neurological: Negative for dizziness, weakness, numbness and headaches.  Hematological: Negative for adenopathy.  All other systems reviewed and are negative.    Allergies  Review of patient's allergies indicates no known allergies.  Home Medications   Current Outpatient Rx  Name Route Sig Dispense Refill  . AMLODIPINE BESYLATE 10 MG  PO TABS Oral Take 1 tablet (10 mg total) by mouth daily. 30 tablet 11    Dose increase effective 12/29/2010  . CALCIUM CARBONATE-VITAMIN D 600-400 MG-UNIT PO CHEW Oral Chew 1 tablet by mouth daily.      . IBUPROFEN 600 MG PO TABS Oral Take 600 mg by mouth 3 (three) times daily with meals.      . IBUPROFEN 800 MG PO TABS Oral Take 1 tablet (800 mg total) by mouth every 8 (eight) hours as needed for pain. 30 tablet 0  . LOSARTAN POTASSIUM-HCTZ 100-25 MG PO  TABS  TAKE ONE TABLET BY MOUTH EVERY DAY 30 tablet 3  . ONE-DAILY MULTI VITAMINS PO TABS Oral Take 1 tablet by mouth daily.        BP 146/91  Pulse 100  Temp(Src) 101.8 F (38.8 C) (Oral)  Resp 18  Ht 5\' 4"  (1.626 m)  Wt 171 lb (77.565 kg)  BMI 29.35 kg/m2  SpO2 100%  Physical Exam  Nursing note and vitals reviewed. Constitutional: She is oriented to person, place, and time. She appears well-developed and well-nourished. No distress.  HENT:  Head: Normocephalic and atraumatic.  Right Ear: Tympanic membrane and ear canal normal.  Left Ear: Tympanic membrane and ear canal normal.  Mouth/Throat: Uvula is midline and mucous membranes are normal. Posterior oropharyngeal erythema present. No oropharyngeal exudate, posterior oropharyngeal edema or tonsillar abscesses.  Neck: Normal range of motion. Neck supple.  Cardiovascular: Normal rate, regular rhythm, normal heart sounds and intact distal pulses.   No murmur heard. Pulmonary/Chest: Effort normal and breath sounds normal. No respiratory distress. She has no wheezes. She has no rales. She exhibits no tenderness.  Abdominal: Soft. She exhibits no distension. There is no tenderness.  Musculoskeletal: Normal range of motion. She exhibits no edema and no tenderness.  Lymphadenopathy:    She has no cervical adenopathy.  Neurological: She is alert and oriented to person, place, and time. She exhibits normal muscle tone. Coordination normal.  Skin: Skin is warm and dry.    ED Course  Procedures (including critical care time)         MDM    Patient is resting, no acute distress. Patient has temp but otherwise vital signs are stable no hypoxia, no significant tachycardia.  She appears non-toxic.  She is currently tolerating po fluids.  I have discussed the pt hx and care plan with the EDP.  Further management of the patient to be handled by the EDP.        Candas Deemer L. [place], [place] 04/23/11 0128

## 2011-05-12 ENCOUNTER — Ambulatory Visit (INDEPENDENT_AMBULATORY_CARE_PROVIDER_SITE_OTHER): Payer: BC Managed Care – PPO | Admitting: Family Medicine

## 2011-05-12 ENCOUNTER — Encounter: Payer: Self-pay | Admitting: Family Medicine

## 2011-05-12 VITALS — BP 132/80 | HR 75 | Resp 18 | Ht 64.5 in | Wt 166.0 lb

## 2011-05-12 DIAGNOSIS — I10 Essential (primary) hypertension: Secondary | ICD-10-CM

## 2011-05-12 DIAGNOSIS — J40 Bronchitis, not specified as acute or chronic: Secondary | ICD-10-CM

## 2011-05-12 MED ORDER — BENZONATATE 100 MG PO CAPS: 100.0000 mg | ORAL_CAPSULE | Freq: Four times a day (QID) | ORAL | Status: DC | PRN

## 2011-05-12 MED ORDER — PENICILLIN V POTASSIUM 500 MG PO TABS: 500.0000 mg | ORAL_TABLET | Freq: Three times a day (TID) | ORAL | Status: AC

## 2011-05-12 MED ORDER — FLUCONAZOLE 150 MG PO TABS: ORAL_TABLET | ORAL | Status: AC

## 2011-05-12 NOTE — Progress Notes (Signed)
  Subjective:    Patient ID: Faith Hood, female    DOB: 01-02-1956, 56 y.o.   MRN: [id number]  HPI 2.5 week h/o head and chest congestion, was in the ED, no definitive scripts provided, symptoms persist with intermittent chills and ear pain also. Pt also reports chest congestion with cough productive of green sputum   Review of Systems See HPI Denies chest pains, palpitations and leg swelling Denies abdominal pain, nausea, vomiting,diarrhea or constipation.   Denies dysuria, frequency, hesitancy or incontinence. Denies joint pain, swelling and limitation in mobility. Denies headaches, seizures, numbness, or tingling. Denies depression, anxiety or insomnia. Denies skin break down or rash.        Objective:   Physical Exam Patient alert and oriented and in no cardiopulmonary distress.  HEENT: No facial asymmetry, EOMI, no sinus tenderness,  oropharynx pink and moist.  Neck supple no adenopathy.  Chest: decreased air entry, scattered crackles  CVS: S1, S2 no murmurs, no S3.  ABD: Soft non tender. Bowel sounds normal.  Ext: No edema  MS: Adequate ROM spine, shoulders, hips and knees.  Skin: Intact, no ulcerations or rash noted.  Psych: Good eye contact, normal affect. Memory intact not anxious or depressed appearing.  CNS: CN 2-12 intact, power, tone and sensation normal throughout.        Assessment & Plan:

## 2011-05-12 NOTE — Patient Instructions (Signed)
F/u as before.  You are being treated for bronchitis.  Work excuse for today to return in am

## 2011-05-24 NOTE — Assessment & Plan Note (Signed)
Acute infection, antibiotics and decongestants prescribed 

## 2011-05-24 NOTE — Assessment & Plan Note (Signed)
Controlled, no change in medication  

## 2011-08-11 ENCOUNTER — Other Ambulatory Visit: Payer: Self-pay | Admitting: Family Medicine

## 2011-11-10 ENCOUNTER — Ambulatory Visit (INDEPENDENT_AMBULATORY_CARE_PROVIDER_SITE_OTHER): Payer: BC Managed Care – PPO | Admitting: Family Medicine

## 2011-11-10 ENCOUNTER — Encounter: Payer: Self-pay | Admitting: Family Medicine

## 2011-11-10 VITALS — BP 122/72 | HR 72 | Resp 18 | Ht 64.5 in | Wt 169.0 lb

## 2011-11-10 DIAGNOSIS — R7309 Other abnormal glucose: Secondary | ICD-10-CM

## 2011-11-10 DIAGNOSIS — I679 Cerebrovascular disease, unspecified: Secondary | ICD-10-CM | POA: Insufficient documentation

## 2011-11-10 DIAGNOSIS — R9431 Abnormal electrocardiogram [ECG] [EKG]: Secondary | ICD-10-CM

## 2011-11-10 DIAGNOSIS — R5381 Other malaise: Secondary | ICD-10-CM

## 2011-11-10 DIAGNOSIS — I10 Essential (primary) hypertension: Secondary | ICD-10-CM

## 2011-11-10 DIAGNOSIS — Z23 Encounter for immunization: Secondary | ICD-10-CM

## 2011-11-10 DIAGNOSIS — E785 Hyperlipidemia, unspecified: Secondary | ICD-10-CM

## 2011-11-10 DIAGNOSIS — R5383 Other fatigue: Secondary | ICD-10-CM

## 2011-11-10 DIAGNOSIS — E663 Overweight: Secondary | ICD-10-CM

## 2011-11-10 DIAGNOSIS — R7303 Prediabetes: Secondary | ICD-10-CM

## 2011-11-10 DIAGNOSIS — R0989 Other specified symptoms and signs involving the circulatory and respiratory systems: Secondary | ICD-10-CM

## 2011-11-10 NOTE — Patient Instructions (Addendum)
F/u in  4 to 6 weeks. Please call if symptoms are worsening  You will have an EKG today.You are referred to cardiology for further evaluation of abnormal EKG   You need labs , chem 7, HBA1C, TSH , cBC, and vit D because of fatigue.fasting lipid  You will get information on sleep hygiene. Please take OTC benadryl one daily to help with sleep, the quality of your sleep is poor.This is contributing to fatigue.  Flu vaccine today.  Start one multivitamin once daily if not on any please.  Medication is sent in for vertigo, if you actually experience spinning   You are referred for a test to ensure no blockage in neck arteries as a cause of your lightheadedness

## 2011-11-10 NOTE — Progress Notes (Signed)
  Subjective:    Patient ID: Faith Hood, female    DOB: 12/12/1955, 56 y.o.   MRN: [id number]  HPI 1 week h/o increased fatigue , sleep has always been poor but has worsened in the past year, gets 7 hours sleep ,but awakens every hour, stays  Awake for about 15 minutes each time. No snoring Substernal chest pressure non radiaiting no accompanying symptoms, occurred with light chores  Review of Systems See HPI Denies recent fever or chills. Denies sinus pressure, nasal congestion, ear pain or sore throat.Experiences intermittent light headedness but denies specific vertigo Denies chest congestion, productive cough or wheezing. Denies chest PND, orthopnea palpitations and leg swelling Denies abdominal pain, nausea, vomiting,diarrhea or constipation.   Denies dysuria, frequency, hesitancy or incontinence. Denies joint pain, swelling and limitation in mobility. Denies headaches, seizures, numbness, or tingling. Denies depression, anxiety or insomnia. Denies skin break down or rash.        Objective:   Physical Exam Patient alert and oriented and in no cardiopulmonary distress.Anxious  HEENT: No facial asymmetry, EOMI, no sinus tenderness,  oropharynx pink and moist.  Neck supple no adenopathy.Bruit  Chest: Clear to auscultation bilaterally.No reproducible chest wall pain  CVS: S1, S2 no murmurs, no S3.  ABD: Soft non tender. Bowel sounds normal.  Ext: No edema  MS: Adequate ROM spine, shoulders, hips and knees.  Skin: Intact, no ulcerations or rash noted.  Psych: Good eye contact, normal affect. Memory intact not anxious or depressed appearing.  CNS: CN 2-12 intact, power, tone and sensation normal throughout.        Assessment & Plan:

## 2011-11-12 ENCOUNTER — Ambulatory Visit (HOSPITAL_COMMUNITY)
Admission: RE | Admit: 2011-11-12 | Discharge: 2011-11-12 | Disposition: A | Discharge status: Home / Self Care | Payer: BC Managed Care – PPO | Source: Ambulatory Visit | Attending: Family Medicine | Admitting: Family Medicine

## 2011-11-12 DIAGNOSIS — I10 Essential (primary) hypertension: Secondary | ICD-10-CM | POA: Insufficient documentation

## 2011-11-12 DIAGNOSIS — I6529 Occlusion and stenosis of unspecified carotid artery: Secondary | ICD-10-CM | POA: Insufficient documentation

## 2011-11-12 DIAGNOSIS — R0989 Other specified symptoms and signs involving the circulatory and respiratory systems: Secondary | ICD-10-CM | POA: Insufficient documentation

## 2011-11-13 LAB — LIPID PANEL
HDL: 41 mg/dL (ref >39)
LDL Cholesterol: 148 mg/dL — ABNORMAL HIGH (ref 0–99)
Total CHOL/HDL Ratio: 5.1 Ratio
Triglycerides: 112 mg/dL (ref <150)
VLDL: 22 mg/dL (ref 0–40)

## 2011-11-13 LAB — BASIC METABOLIC PANEL
CO2: 29 mEq/L (ref 19–32)
Calcium: 10.6 mg/dL — ABNORMAL HIGH (ref 8.4–10.5)
Creat: 0.85 mg/dL (ref 0.50–1.10)

## 2011-11-13 LAB — CBC
HCT: 39.9 % (ref 36.0–46.0)
Hemoglobin: 13.6 g/dL (ref 12.0–15.0)
MCV: 82.3 fL (ref 78.0–100.0)
RBC: 4.85 MIL/uL (ref 3.87–5.11)
WBC: 6 10*3/uL (ref 4.0–10.5)

## 2011-11-17 ENCOUNTER — Encounter: Payer: Self-pay | Admitting: Family Medicine

## 2011-11-26 ENCOUNTER — Encounter: Payer: Self-pay | Admitting: Cardiology

## 2011-11-26 ENCOUNTER — Ambulatory Visit (INDEPENDENT_AMBULATORY_CARE_PROVIDER_SITE_OTHER): Payer: BC Managed Care – PPO | Admitting: Cardiology

## 2011-11-26 VITALS — BP 120/80 | HR 89 | Ht 64.0 in | Wt 171.0 lb

## 2011-11-26 DIAGNOSIS — I679 Cerebrovascular disease, unspecified: Secondary | ICD-10-CM

## 2011-11-26 DIAGNOSIS — H547 Unspecified visual loss: Secondary | ICD-10-CM

## 2011-11-26 DIAGNOSIS — I10 Essential (primary) hypertension: Secondary | ICD-10-CM

## 2011-11-26 DIAGNOSIS — R0600 Dyspnea, unspecified: Secondary | ICD-10-CM

## 2011-11-26 DIAGNOSIS — E663 Overweight: Secondary | ICD-10-CM

## 2011-11-26 DIAGNOSIS — R0989 Other specified symptoms and signs involving the circulatory and respiratory systems: Secondary | ICD-10-CM

## 2011-11-26 DIAGNOSIS — R9431 Abnormal electrocardiogram [ECG] [EKG]: Secondary | ICD-10-CM

## 2011-11-26 DIAGNOSIS — R0602 Shortness of breath: Secondary | ICD-10-CM | POA: Insufficient documentation

## 2011-11-26 DIAGNOSIS — E785 Hyperlipidemia, unspecified: Secondary | ICD-10-CM

## 2011-11-26 NOTE — Assessment & Plan Note (Signed)
Lipid profile is suboptimal.  In the setting of cerebrovascular disease, treatment with a statin will be initiated and a repeat assessment of lipid levels obtained.

## 2011-11-26 NOTE — Progress Notes (Signed)
Patient ID: JEMIA FATA, female   DOB: Sep 21, 1955, 55 y.o.   MRN: [id number]  HPI: Initial Cardiology visit at the kind request of Dr. Lodema Hong for evaluation of exertional fatigue and dyspnea.  This nice woman has enjoyed generally excellent health, hospitalized only once in her life approximately 30 years ago for diagnostic laparoscopy.  She has had hypertension that has been well controlled and hyperlipidemia that has been untreated.  There is no history of diabetes.  She was recently found to have moderate but asymptomatic cerebrovascular disease.  Current Outpatient Prescriptions on File Prior to Visit  Medication Sig Dispense Refill  . amLODipine (NORVASC) 10 MG tablet Take 1 tablet (10 mg total) by mouth daily.  30 tablet  11  . benzonatate (TESSALON PERLES) 100 MG capsule Take 1 capsule (100 mg total) by mouth every 6 (six) hours as needed for cough.  30 capsule  0  . Calcium Carbonate-Vitamin D (CALCIUM 600/VITAMIN D) 600-400 MG-UNIT per chew tablet Chew 1 tablet by mouth daily.        Marland Kitchen ibuprofen (ADVIL,MOTRIN) 600 MG tablet Take 600 mg by mouth 3 (three) times daily with meals.        Marland Kitchen losartan-hydrochlorothiazide (HYZAAR) 100-25 MG per tablet TAKE ONE TABLET BY MOUTH EVERY DAY  30 tablet  6  . Multiple Vitamin (MULTIVITAMIN) tablet Take 1 tablet by mouth daily.         No Known Allergies    Past Medical History  Diagnosis Date  . Hypertension   . Hepatic steatosis     By CT scan  . Uterine leiomyoma     By CT scan  . Overweight   . Hyperlipidemia     Past Surgical History  Procedure Date  . Diagnostic laparoscopy 1978    Gynecologic problems  . Colonoscopy 12/2007    Negative screening study    Family History  Problem Relation Age of Onset  . Pancreatic cancer Mother 66    Diagnosed in 2009; currently in hospice pt. htn  . Hypertension Mother     + Sister x4  . Cancer Father 72    brain tumor  . Cancer Other     MGM side cervical cancer /MGM side  stomach cancer     . Colon cancer Neg Hx     History   Social History  . Marital Status: Single    Spouse Name: N/A    Number of Children: N/A  . Years of Education: N/A   Occupational History  . Middle school teacher     X25 years   Social History Main Topics  . Smoking status: Never Smoker   . Smokeless tobacco: Never Used  . Alcohol Use: No     Never Drink  . Drug Use: No  . Sexually Active: Not on file   Other Topics Concern  . Not on file   Social History Narrative  . No narrative on file    ROS: Decreased energy; recent weight gain; intermittent mild pedal edema; normal colonoscopy in 2009; urinary frequency; right arm discomfort; insomnia-awakens every hour; no history consistent with sleep apnea.  All other systems reviewed and are negative.  PHYSICAL EXAM: BP 120/80  Pulse 89  Ht 5\' 4"  (1.626 m)  Wt 77.565 kg (171 lb)  BMI 29.35 kg/m2  SpO2 97%  General-Well-developed; no acute distress Body Habitus-Moderately overweight HEENT-[place]/AT; PERRL; EOM intact; conjunctiva and lids nl Neck-No JVD; no carotid bruits Endocrine-No thyromegaly Lungs-Clear lung fields; resonant percussion;  normal I-to-E ratio Cardiovascular- normal PMI; normal S1 and S2; minimal early systolic murmur Abdomen-BS normal; soft and non-tender without masses or organomegaly Musculoskeletal-No deformities, cyanosis or clubbing Neurologic-Nl cranial nerves; symmetric strength and tone Skin- Warm, no significant lesions Extremities-Nl distal pulses; no edema  EKG:  Tracing performed 11/10/2011 obtained and reviewed.  Abnormal sinus rhythm; and delayed R-wave progression; low voltage in the V. Leads; nonspecific T-wave abnormality.   ASSESSMENT AND PLAN:   Chical Bing, MD 11/26/2011 1:45 PM

## 2011-11-26 NOTE — Assessment & Plan Note (Signed)
Blood pressure control is excellent with current medication, which will be continued. 

## 2011-11-26 NOTE — Assessment & Plan Note (Signed)
Information provided regarding weight loss and appropriate diet.  Exercise program will be deferred until cardiology evaluation has been completed.

## 2011-11-26 NOTE — Patient Instructions (Addendum)
Your physician recommends that you schedule a follow-up appointment in: After testing  Your physician has requested that you have a stress echocardiogram. For further information please visit [email or web address] Please follow instruction sheet as given.  Your physician has requested that you have an echocardiogram. Echocardiography is a painless test that uses sound waves to create images of your heart. It provides your doctor with information about the size and shape of your heart and how well your heart's chambers and valves are working. This procedure takes approximately one hour. There are no restrictions for this procedure.  A chest x-ray takes a picture of the organs and structures inside the chest, including the heart, lungs, and blood vessels. This test can show several things, including, whether the heart is enlarges; whether fluid is building up in the lungs; and whether pacemaker / defibrillator leads are still in place.  ADDENDUM 10/7 - TC - Patient notified of recommendations Your physician has recommended you make the following change in your medication:  1 - ADD ASA 81 mg daily 2 - START Lipitor 40 mg daily  Your physician recommends that you return for lab work in: 1 month

## 2011-11-26 NOTE — Progress Notes (Deleted)
Name: Faith Hood    DOB: [birth year]  Age: 56 y.o.  MR#: [id number]       PCP:  Syliva Overman, MD      Insurance: @PAYORNAME @   CC:   No chief complaint on file.   VS BP 120/80  Pulse 89  Ht 5\' 4"  (1.626 m)  Wt 171 lb (77.565 kg)  BMI 29.35 kg/m2  SpO2 97%  Weights Current Weight  11/26/11 171 lb (77.565 kg)  11/10/11 169 lb 0.6 oz (76.676 kg)  05/12/11 166 lb 0.6 oz (75.315 kg)    Blood Pressure  BP Readings from Last 3 Encounters:  11/26/11 120/80  11/10/11 122/72  05/12/11 132/80     Admit date:  (Not on file) Last encounter with RMR:  Visit date not found   Allergy No Known Allergies  Current Outpatient Prescriptions  Medication Sig Dispense Refill  . amLODipine (NORVASC) 10 MG tablet Take 1 tablet (10 mg total) by mouth daily.  30 tablet  11  . benzonatate (TESSALON PERLES) 100 MG capsule Take 1 capsule (100 mg total) by mouth every 6 (six) hours as needed for cough.  30 capsule  0  . Calcium Carbonate-Vitamin D (CALCIUM 600/VITAMIN D) 600-400 MG-UNIT per chew tablet Chew 1 tablet by mouth daily.        Marland Kitchen ibuprofen (ADVIL,MOTRIN) 600 MG tablet Take 600 mg by mouth 3 (three) times daily with meals.        Marland Kitchen losartan-hydrochlorothiazide (HYZAAR) 100-25 MG per tablet TAKE ONE TABLET BY MOUTH EVERY DAY  30 tablet  6  . Multiple Vitamin (MULTIVITAMIN) tablet Take 1 tablet by mouth daily.          Discontinued Meds:   There are no discontinued medications.  Patient Active Problem List  Diagnosis  . FIBROIDS, UTERUS  . HYPERLIPIDEMIA  . OVERWEIGHT  . UNSPECIFIED VISUAL LOSS  . Hypertension  . Cerebrovascular disease  . Abnormal EKG    LABS Office Visit on 11/10/2011  Component Date Value  . Hemoglobin A1C 11/13/2011 5.8*  . Mean Plasma Glucose 11/13/2011 120*  . TSH 11/13/2011 2.544   . WBC 11/13/2011 6.0   . RBC 11/13/2011 4.85   . Hemoglobin 11/13/2011 13.6   . HCT 11/13/2011 39.9   . MCV 11/13/2011 82.3   . [facility] 11/13/2011 28.0   . MCHC 11/13/2011  34.1   . RDW 11/13/2011 14.9   . Platelets 11/13/2011 315   . Vit D, 25-Hydroxy 11/13/2011 38   . Cholesterol 11/13/2011 211*  . Triglycerides 11/13/2011 112   . HDL 11/13/2011 41   . Total CHOL/HDL Ratio 11/13/2011 5.1   . VLDL 11/13/2011 22   . LDL Cholesterol 11/13/2011 148*  . Sodium 11/13/2011 143   . Potassium 11/13/2011 4.0   . Chloride 11/13/2011 104   . CO2 11/13/2011 29   . Glucose, Bld 11/13/2011 85   . BUN 11/13/2011 13   . Creat 11/13/2011 0.85   . Calcium 11/13/2011 10.6*     Results for this Opt Visit:     Results for orders placed in visit on 11/10/11  HEMOGLOBIN A1C      Component Value Range   Hemoglobin A1C 5.8 (*) <5.7 %   Mean Plasma Glucose 120 (*) <117 mg/dL  TSH      Component Value Range   TSH 2.544  0.350 - 4.500 uIU/mL  CBC      Component Value Range   WBC 6.0  4.0 - 10.5 K/uL  RBC 4.85  3.87 - 5.11 MIL/uL   Hemoglobin 13.6  12.0 - 15.0 g/dL   HCT 16.1  09.6 - 04.5 %   MCV 82.3  78.0 - 100.0 fL   MCH 28.0  26.0 - 34.0 pg   MCHC 34.1  30.0 - 36.0 g/dL   RDW 40.9  81.1 - 91.4 %   Platelets 315  150 - 400 K/uL  VITAMIN D 25 HYDROXY      Component Value Range   Vit D, 25-Hydroxy 38  30 - 89 ng/mL  LIPID PANEL      Component Value Range   Cholesterol 211 (*) 0 - 200 mg/dL   Triglycerides 782  <956 mg/dL   HDL 41  >21 mg/dL   Total CHOL/HDL Ratio 5.1     VLDL 22  0 - 40 mg/dL   LDL Cholesterol 308 (*) 0 - 99 mg/dL  BASIC METABOLIC PANEL      Component Value Range   Sodium 143  135 - 145 mEq/L   Potassium 4.0  3.5 - 5.3 mEq/L   Chloride 104  96 - 112 mEq/L   CO2 29  19 - 32 mEq/L   Glucose, Bld 85  70 - 99 mg/dL   BUN 13  6 - 23 mg/dL   Creat 6.57  8.46 - 9.62 mg/dL   Calcium 95.2 (*) 8.4 - 10.5 mg/dL    EKG Orders placed in visit on 11/17/11  . EKG     Prior Assessment and Plan Problem List as of 11/26/2011            Cardiology Problems   HYPERLIPIDEMIA   Last Assessment & Plan Note   04/09/2011 Office Visit Signed  04/11/2011  9:57 AM by Kerri Perches, MD    Hyperlipidemia:Low fat diet discussed and encouraged.      Hypertension   Last Assessment & Plan Note   05/12/2011 Office Visit Signed 05/24/2011 10:09 PM by Kerri Perches, MD    Controlled, no change in medication     Cerebrovascular disease     Other   FIBROIDS, UTERUS   OVERWEIGHT   Last Assessment & Plan Note   04/09/2011 Office Visit Signed 04/11/2011  9:58 AM by Kerri Perches, MD    Improved. Pt applauded on succesful weight loss through lifestyle change, and encouraged to continue same. Weight loss goal set for the next several months.     UNSPECIFIED VISUAL LOSS   Abnormal EKG       Imaging: US Carotid Duplex Bilateral  11/12/2011  *RADIOLOGY REPORT*  Clinical Data: Carotid bruit, lightheadedness, history hypertension  BILATERAL CAROTID DUPLEX ULTRASOUND  Technique: Gray scale imaging, color Doppler and duplex ultrasound was performed of bilateral carotid and vertebral arteries in the neck.  Comparison:  None  Criteria:  Quantification of carotid stenosis is based on velocity parameters that correlate the residual internal carotid diameter with NASCET-based stenosis levels, using the diameter of the distal internal carotid lumen as the denominator for stenosis measurement.  The following velocity measurements were obtained:                   PEAK SYSTOLIC/END DIASTOLIC RIGHT ICA:                        128/56cm/sec CCA:                        57/20cm/sec SYSTOLIC ICA/CCA  RATIO:     2.27 DIASTOLIC ICA/CCA RATIO:    2.83 ECA:                        64cm/sec  LEFT ICA:                        93/40cm/sec CCA:                        56/18cm/sec SYSTOLIC ICA/CCA RATIO:     1.66 DIASTOLIC ICA/CCA RATIO:    2.22 ECA:                        78cm/sec  Findings:  RIGHT CAROTID ARTERY: Tortuous right CCA and ICA. Small amount of noncalcified plaque at right carotid bulb.  No ICA plaque or calcified plaque formation identified.  Laminar  flow by color Doppler imaging.  No high velocity jets.  RIGHT VERTEBRAL ARTERY:  Patent, antegrade  LEFT CAROTID ARTERY: Mildly tortuous left ECA and ICA. Calcified plaque left carotid bulb.  Laminar flow on color Doppler imaging. Spectral broadening left ICA on waveform analysis suspect related to tortuosity.  No high velocity jets.  LEFT VERTEBRAL ARTERY:  Patent, antegrade  IMPRESSION: Plaque formation at carotid bifurcations greater on left. Minimally elevated peak systolic velocity within the right ICA. While the obtained velocity would correspond to a 50-69% diameter stenosis, no significant plaque is identified proximal to this site and I suspect the elevation is artifactual and more related to tortuosity than to narrowing, likely less than 50% diameter narrowing. Recommend follow up surveillance imaging in 1 year.   Original Report Authenticated By: Lollie Marrow, M.D.      [facility] Calculation: Score not calculated. Missing: Total Cholesterol

## 2011-11-26 NOTE — Assessment & Plan Note (Signed)
EKG findings are nonspecific and likely of little pathological significance.

## 2011-11-26 NOTE — Assessment & Plan Note (Signed)
Significant atherosclerotic plaque identified on recent carotid ultrasound.  Risk of symptomatic disease should be minimized by treatment with a statin and aspirin.

## 2011-11-29 DIAGNOSIS — R7303 Prediabetes: Secondary | ICD-10-CM | POA: Insufficient documentation

## 2011-11-29 DIAGNOSIS — R0989 Other specified symptoms and signs involving the circulatory and respiratory systems: Secondary | ICD-10-CM | POA: Insufficient documentation

## 2011-11-29 NOTE — Assessment & Plan Note (Signed)
The importance of lifestyle change to avert the development of diabetes is stressed

## 2011-11-29 NOTE — Assessment & Plan Note (Signed)
New onset light headedness with bruit, pt referred for doppler study to further evaluate

## 2011-11-29 NOTE — Assessment & Plan Note (Signed)
New onset fatigue and light headedness with abnormal EKG will refer to cardiology for further eval

## 2011-11-29 NOTE — Assessment & Plan Note (Signed)
Controlled, no change in medication DASH diet and commitment to daily physical activity for a minimum of 30 minutes discussed and encouraged, as a part of hypertension management. The importance of attaining a healthy weight is also discussed.  

## 2011-11-29 NOTE — Assessment & Plan Note (Signed)
Deteriorated. Patient re-educated about  the importance of commitment to a  minimum of 150 minutes of exercise per week. The importance of healthy food choices with portion control discussed. Encouraged to start a food diary, count calories and to consider  joining a support group. Sample diet sheets offered. Goals set by the patient for the next several months.    

## 2011-11-30 ENCOUNTER — Encounter: Payer: Self-pay | Admitting: *Deleted

## 2011-11-30 MED ORDER — ATORVASTATIN CALCIUM 40 MG PO TABS: 40.0000 mg | ORAL_TABLET | Freq: Every day | ORAL | Status: DC

## 2011-11-30 NOTE — Addendum Note (Signed)
Addended by: Reather Laurence A on: 11/30/2011 04:06 PM   Modules accepted: Orders

## 2011-11-30 NOTE — Assessment & Plan Note (Signed)
Symptoms are of concern in light of patient's multiple cardiovascular risk factors, known cerebrovascular disease, albeit mild, and the relative suddenness of the onset of exercise intolerance.  We will proceed with an echocardiogram and a stress echocardiogram for further evaluation.

## 2011-12-07 ENCOUNTER — Ambulatory Visit: Payer: No Typology Code available for payment source | Admitting: Family Medicine

## 2011-12-08 ENCOUNTER — Other Ambulatory Visit: Payer: Self-pay | Admitting: Family Medicine

## 2011-12-08 ENCOUNTER — Ambulatory Visit (HOSPITAL_COMMUNITY)
Admission: RE | Admit: 2011-12-08 | Discharge: 2011-12-08 | Disposition: A | Discharge status: Home / Self Care | Payer: BC Managed Care – PPO | Source: Ambulatory Visit | Attending: Cardiology | Admitting: Cardiology

## 2011-12-08 ENCOUNTER — Encounter (HOSPITAL_COMMUNITY): Payer: Self-pay | Admitting: Cardiology

## 2011-12-08 DIAGNOSIS — R0989 Other specified symptoms and signs involving the circulatory and respiratory systems: Secondary | ICD-10-CM | POA: Insufficient documentation

## 2011-12-08 DIAGNOSIS — R0600 Dyspnea, unspecified: Secondary | ICD-10-CM

## 2011-12-08 DIAGNOSIS — I10 Essential (primary) hypertension: Secondary | ICD-10-CM | POA: Insufficient documentation

## 2011-12-08 DIAGNOSIS — R002 Palpitations: Secondary | ICD-10-CM | POA: Insufficient documentation

## 2011-12-08 DIAGNOSIS — R0609 Other forms of dyspnea: Secondary | ICD-10-CM | POA: Insufficient documentation

## 2011-12-08 DIAGNOSIS — R0602 Shortness of breath: Secondary | ICD-10-CM

## 2011-12-08 DIAGNOSIS — Z139 Encounter for screening, unspecified: Secondary | ICD-10-CM

## 2011-12-08 NOTE — Progress Notes (Signed)
*  PRELIMINARY RESULTS* Echocardiogram 2D Echocardiogram has been performed.  Faith Hood 12/08/2011, 8:55 AM

## 2011-12-08 NOTE — Progress Notes (Signed)
Stress Lab Nurses Notes - [facility]  Karinda Cabriales Slager 12/08/2011 Reason for doing test: Dyspnea and fatigue Type of test: Stress Echo Nurse performing test: Parke Poisson, RN Nuclear Medicine Tech: Not Applicable Echo Tech: Karrie Doffing MD performing test: Ival Bible & Ronie Spies PA Family MD: Lodema Hong  Test explained and consent signed: yes IV started: No IV started Symptoms: Fatigue Treatment/Intervention: None Reason test stopped: fatigue and reached target HR After recovery IV was: NA Patient to return to Nuc. Med at :NA Patient discharged: Home Patient's Condition upon discharge was: stable Comments: During test peak BP 122/86 & HR 166.  Recovery BP 121/69 & HR 86.  Symptoms resolved in recovery. Erskine Speed T

## 2011-12-08 NOTE — Progress Notes (Signed)
*  PRELIMINARY RESULTS* Echocardiogram Echocardiogram Stress Test has been performed.  Conrad Forsyth 12/08/2011, 9:54 AM

## 2011-12-09 ENCOUNTER — Encounter: Payer: Self-pay | Admitting: *Deleted

## 2011-12-22 ENCOUNTER — Encounter: Payer: Self-pay | Admitting: Family Medicine

## 2011-12-22 ENCOUNTER — Ambulatory Visit (INDEPENDENT_AMBULATORY_CARE_PROVIDER_SITE_OTHER): Payer: BC Managed Care – PPO | Admitting: Family Medicine

## 2011-12-22 VITALS — BP 130/72 | HR 90 | Resp 18 | Ht 64.5 in | Wt 170.1 lb

## 2011-12-22 DIAGNOSIS — E785 Hyperlipidemia, unspecified: Secondary | ICD-10-CM

## 2011-12-22 DIAGNOSIS — E663 Overweight: Secondary | ICD-10-CM

## 2011-12-22 DIAGNOSIS — I10 Essential (primary) hypertension: Secondary | ICD-10-CM

## 2011-12-22 DIAGNOSIS — R7309 Other abnormal glucose: Secondary | ICD-10-CM

## 2011-12-22 DIAGNOSIS — R7303 Prediabetes: Secondary | ICD-10-CM

## 2011-12-22 NOTE — Progress Notes (Signed)
  Subjective:    Patient ID: Faith Hood, female    DOB: December 07, 1955, 56 y.o.   MRN: [id number]  HPI The PT is here for follow up and re-evaluation of chronic medical conditions, medication management and review of any available recent lab and radiology data.  Preventive health is updated, specifically  Cancer screening and Immunization.   Questions or concerns regarding consultations or procedures which the PT has had in the interim are  Addressed.Has been evaluated by cardiology, aggressive lifestyle modification and statin use suggested, pt does not wish to take the statin, highly motivated re lifestyle The PT denies any adverse reactions to current medications since the last visit.  There are no new concerns.  There are no specific complaints       Review of Systems See HPI Denies recent fever or chills. Denies sinus pressure, nasal congestion, ear pain or sore throat. Denies chest congestion, productive cough or wheezing. Denies chest pains, palpitations and leg swelling Denies abdominal pain, nausea, vomiting,diarrhea or constipation.   Denies dysuria, frequency, hesitancy or incontinence. Denies joint pain, swelling and limitation in mobility. Denies headaches, seizures, numbness, or tingling. Denies depression, anxiety or insomnia. Denies skin break down or rash.         Objective:   Physical Exam Patient alert and oriented and in no cardiopulmonary distress.  HEENT: No facial asymmetry, EOMI, no sinus tenderness,  oropharynx pink and moist.  Neck supple no adenopathy.  Chest: Clear to auscultation bilaterally.  CVS: S1, S2 no murmurs, no S3.  ABD: Soft non tender. Bowel sounds normal.  Ext: No edema  MS: Adequate ROM spine, shoulders, hips and knees.  Skin: Intact, no ulcerations or rash noted.  Psych: Good eye contact, normal affect. Memory intact not anxious or depressed appearing.  CNS: CN 2-12 intact, power, tone and sensation normal  throughout.        Assessment & Plan:

## 2011-12-22 NOTE — Patient Instructions (Addendum)
CPE in early March  Please call if you need me before  Keep appt with cardiology, and  again have an open discussion about the value of lipitor IN YOUR CASE   Fasting lipid, cmp , hBA1C and PTH in March  Remember you are prediabetic, you need to attend class at hospital to learn good nutrition  Commit to daily physical activity and weight loss of approx 2 to 3 pounds per month

## 2011-12-30 ENCOUNTER — Ambulatory Visit: Payer: BC Managed Care – PPO | Admitting: Cardiology

## 2012-01-03 NOTE — Assessment & Plan Note (Signed)
Unchanged. Patient re-educated about  the importance of commitment to a  minimum of 150 minutes of exercise per week. The importance of healthy food choices with portion control discussed. Encouraged to start a food diary, count calories and to consider  joining a support group. Sample diet sheets offered. Goals set by the patient for the next several months.    

## 2012-01-03 NOTE — Assessment & Plan Note (Signed)
Pt advised to work on lifestyle change to improve blood sugar control. No meds at this time

## 2012-01-03 NOTE — Assessment & Plan Note (Signed)
Controlled, no change in medication DASH diet and commitment to daily physical activity for a minimum of 30 minutes discussed and encouraged, as a part of hypertension management. The importance of attaining a healthy weight is also discussed.  

## 2012-01-03 NOTE — Assessment & Plan Note (Signed)
Elevated lipids, though recommended by card, pt is opting to hold on statins at thsi time. I explained possible benefit of stabilizing endothelium and reducing cAd risk also, she choses to pursue no meds at this time

## 2012-01-14 ENCOUNTER — Ambulatory Visit (HOSPITAL_COMMUNITY)
Admission: RE | Admit: 2012-01-14 | Discharge: 2012-01-14 | Disposition: A | Discharge status: Home / Self Care | Payer: BC Managed Care – PPO | Source: Ambulatory Visit | Attending: Family Medicine | Admitting: Family Medicine

## 2012-01-14 DIAGNOSIS — Z1231 Encounter for screening mammogram for malignant neoplasm of breast: Secondary | ICD-10-CM | POA: Insufficient documentation

## 2012-01-14 DIAGNOSIS — Z139 Encounter for screening, unspecified: Secondary | ICD-10-CM

## 2012-01-17 ENCOUNTER — Other Ambulatory Visit: Payer: Self-pay | Admitting: Family Medicine

## 2012-02-04 ENCOUNTER — Ambulatory Visit: Payer: BC Managed Care – PPO | Admitting: Cardiology

## 2012-02-18 ENCOUNTER — Telehealth: Payer: Self-pay | Admitting: Cardiology

## 2012-02-18 ENCOUNTER — Ambulatory Visit: Payer: BC Managed Care – PPO | Admitting: Cardiology

## 2012-02-18 NOTE — Telephone Encounter (Signed)
Patient no showed appointment.  Called to reschedule but there was no answer or no machine. Letter mailed to patient. / tgs

## 2012-02-19 ENCOUNTER — Other Ambulatory Visit: Payer: Self-pay | Admitting: Family Medicine

## 2012-03-23 ENCOUNTER — Other Ambulatory Visit: Payer: Self-pay | Admitting: Family Medicine

## 2012-05-17 ENCOUNTER — Encounter: Payer: BC Managed Care – PPO | Admitting: Family Medicine

## 2012-07-05 ENCOUNTER — Encounter: Payer: Self-pay | Admitting: Family Medicine

## 2012-07-05 ENCOUNTER — Ambulatory Visit (INDEPENDENT_AMBULATORY_CARE_PROVIDER_SITE_OTHER): Payer: BC Managed Care – PPO | Admitting: Family Medicine

## 2012-07-05 ENCOUNTER — Other Ambulatory Visit: Payer: Self-pay | Admitting: Family Medicine

## 2012-07-05 VITALS — BP 126/70 | HR 60 | Resp 18 | Ht 64.5 in | Wt 163.1 lb

## 2012-07-05 DIAGNOSIS — E785 Hyperlipidemia, unspecified: Secondary | ICD-10-CM

## 2012-07-05 DIAGNOSIS — Z0000 Encounter for general adult medical examination without abnormal findings: Secondary | ICD-10-CM

## 2012-07-05 DIAGNOSIS — R7303 Prediabetes: Secondary | ICD-10-CM

## 2012-07-05 DIAGNOSIS — E663 Overweight: Secondary | ICD-10-CM

## 2012-07-05 DIAGNOSIS — R7309 Other abnormal glucose: Secondary | ICD-10-CM

## 2012-07-05 NOTE — Patient Instructions (Addendum)
F/u in 6 month, call if you need me before  Congrats on healthy habits and weight loss.  Blood pressure is excellent, no med change  Fasting lipid, chem 7 and HBa1C toda, and again in 5.5 months with CBC and TSH also   No pap is being sent, pelvic exam is within normal

## 2012-07-05 NOTE — Progress Notes (Signed)
  Subjective:    Patient ID: Faith Hood, female    DOB: 06-15-1955, 57 y.o.   MRN: [id number]  HPI  The PT is here for annual exam and re-evaluation of chronic medical conditions, medication management and review of any available recent lab and radiology data.  Preventive health is updated, specifically  Cancer screening and Immunization.   Questions or concerns regarding consultations or procedures which the PT has had in the interim are  addressed. The PT denies any adverse reactions to current medications since the last visit.  There are no new concerns. She has worked consistently on exercise and weight loss wit excellentresults There are no specific complaints      Review of Systems    See HPI Denies recent fever or chills. Denies sinus pressure, nasal congestion, ear pain or sore throat. Denies chest congestion, productive cough or wheezing. Denies chest pains, palpitations and leg swelling Denies abdominal pain, nausea, vomiting,diarrhea or constipation.   Denies dysuria, frequency, hesitancy or incontinence. Denies joint pain, swelling and limitation in mobility. Denies headaches, seizures, numbness, or tingling. Denies depression, anxiety or insomnia. Denies skin break down or rash.     Objective:   Physical Exam  Pleasant well nourished female, alert and oriented x 3, in no cardio-pulmonary distress. Afebrile. HEENT No facial trauma or asymetry. Sinuses non tender.  EOMI, PERTL, fundoscopic exam is normal, no hemorhage or exudate.  External ears normal, tympanic membranes clear. Oropharynx moist, no exudate, good dentition. Neck: supple, no adenopathy,JVD or thyromegaly.No bruits.  Chest: Clear to ascultation bilaterally.No crackles or wheezes. Non tender to palpation  Breast: No asymetry,no masses. No nipple discharge or inversion. No axillary or supraclavicular adenopathy  Cardiovascular system; Heart sounds normal,  S1 and  S2 ,no S3.  No murmur, or  thrill. Apical beat not displaced Peripheral pulses normal.  Abdomen: Soft, non tender, no organomegaly or masses. No bruits. Bowel sounds normal. No guarding, tenderness or rebound.  Rectal:  No mass. Guaiac negative stool.  GU: External genitalia normal. No lesions. Vaginal canal normal.No discharge. Uterus normal size, no adnexal masses, no cervical motion or adnexal tenderness.  Musculoskeletal exam: Full ROM of spine, hips , shoulders and knees. No deformity ,swelling or crepitus noted. No muscle wasting or atrophy.   Neurologic: Cranial nerves 2 to 12 intact. Power, tone ,sensation and reflexes normal throughout. No disturbance in gait. No tremor.  Skin: Intact, no ulceration, erythema , scaling or rash noted. Pigmentation normal throughout  Psych; Normal mood and affect. Judgement and concentration normal       Assessment & Plan:

## 2012-07-06 LAB — LIPID PANEL
Cholesterol: 197 mg/dL (ref 0–200)
HDL: 42 mg/dL (ref >39)

## 2012-07-06 LAB — BASIC METABOLIC PANEL
BUN: 16 mg/dL (ref 6–23)
CO2: 29 mEq/L (ref 19–32)
Calcium: 10.2 mg/dL (ref 8.4–10.5)
Glucose, Bld: 85 mg/dL (ref 70–99)
Sodium: 142 mEq/L (ref 135–145)

## 2012-07-11 DIAGNOSIS — Z0000 Encounter for general adult medical examination without abnormal findings: Secondary | ICD-10-CM | POA: Insufficient documentation

## 2012-07-11 NOTE — Assessment & Plan Note (Signed)
Pelvic and breast as documented. Normal exam  No pap sent, not indicated due to recent normal Pt applauded on weight loss with improved blood sugars, she is to continue same

## 2012-10-19 ENCOUNTER — Other Ambulatory Visit: Payer: Self-pay | Admitting: Family Medicine

## 2012-11-14 ENCOUNTER — Encounter: Payer: Self-pay | Admitting: *Deleted

## 2012-12-14 ENCOUNTER — Encounter: Payer: Self-pay | Admitting: Family Medicine

## 2012-12-14 ENCOUNTER — Encounter (INDEPENDENT_AMBULATORY_CARE_PROVIDER_SITE_OTHER): Payer: Self-pay

## 2012-12-14 ENCOUNTER — Ambulatory Visit (INDEPENDENT_AMBULATORY_CARE_PROVIDER_SITE_OTHER): Payer: BC Managed Care – PPO | Admitting: Family Medicine

## 2012-12-14 VITALS — BP 122/80 | HR 76 | Resp 18 | Ht 64.5 in | Wt 162.0 lb

## 2012-12-14 DIAGNOSIS — Z83511 Family history of glaucoma: Secondary | ICD-10-CM

## 2012-12-14 DIAGNOSIS — E785 Hyperlipidemia, unspecified: Secondary | ICD-10-CM

## 2012-12-14 DIAGNOSIS — H547 Unspecified visual loss: Secondary | ICD-10-CM

## 2012-12-14 DIAGNOSIS — R7303 Prediabetes: Secondary | ICD-10-CM

## 2012-12-14 DIAGNOSIS — Z23 Encounter for immunization: Secondary | ICD-10-CM

## 2012-12-14 DIAGNOSIS — Z111 Encounter for screening for respiratory tuberculosis: Secondary | ICD-10-CM

## 2012-12-14 DIAGNOSIS — E663 Overweight: Secondary | ICD-10-CM

## 2012-12-14 DIAGNOSIS — I10 Essential (primary) hypertension: Secondary | ICD-10-CM

## 2012-12-14 DIAGNOSIS — R7309 Other abnormal glucose: Secondary | ICD-10-CM

## 2012-12-14 NOTE — Patient Instructions (Signed)
F/u in 6 month, call if you need me before  TB test placement today, return on Friday at 11 for reading  Flu vaccine today  Fasting CBC, lipid, chem 7 and TSh this week   PLEASE schedule mammogram due Nov 23 or  after as discussed.  You are referred to Dr Nile Riggs for eye exam  Continue regular exercise, try to eat more vegetable , and less starch to help with weight loss

## 2012-12-14 NOTE — Progress Notes (Signed)
  Subjective:    Patient ID: Faith Hood, female    DOB: [birth year], 57 y.o.   MRN: [id number]  HPI The PT is here for follow up and re-evaluation of chronic medical conditions, medication management and review of any available recent lab and radiology data.  Preventive health is updated, specifically  Cancer screening and Immunization.   Questions or concerns regarding consultations or procedures which the PT has had in the interim are  addressed. The PT denies any adverse reactions to current medications since the last visit.  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 Denies sinus pressure, nasal congestion, ear pain or sore throat. Denies chest congestion, productive cough or wheezing. Denies chest pains, palpitations and leg swelling Denies abdominal pain, nausea, vomiting,diarrhea or constipation.   Denies dysuria, frequency, hesitancy or incontinence. Denies joint pain, swelling and limitation in mobility. Denies headaches, seizures, numbness, or tingling. Denies depression, anxiety or insomnia. Denies skin break down or rash.        Objective:   Physical Exam Patient alert and oriented and in no cardiopulmonary distress.  HEENT: No facial asymmetry, EOMI, no sinus tenderness,  oropharynx pink and moist.  Neck supple no adenopathy.  Chest: Clear to auscultation bilaterally.  CVS: S1, S2 no murmurs, no S3.  ABD: Soft non tender. Bowel sounds normal.  Ext: No edema  MS: Adequate ROM spine, shoulders, hips and knees.  Skin: Intact, no ulcerations or rash noted.  Psych: Good eye contact, normal affect. Memory intact not anxious or depressed appearing.  CNS: CN 2-12 intact, power, tone and sensation normal  throughout.        Assessment & Plan:

## 2012-12-19 ENCOUNTER — Other Ambulatory Visit: Payer: Self-pay | Admitting: Family Medicine

## 2012-12-19 DIAGNOSIS — Z139 Encounter for screening, unspecified: Secondary | ICD-10-CM

## 2012-12-20 LAB — LIPID PANEL
LDL Cholesterol: 129 mg/dL — ABNORMAL HIGH (ref 0–99)
VLDL: 22 mg/dL (ref 0–40)

## 2012-12-20 LAB — CBC
MCV: 82.9 fL (ref 78.0–100.0)
Platelets: 248 10*3/uL (ref 150–400)
RBC: 4.51 MIL/uL (ref 3.87–5.11)
RDW: 14.5 % (ref 11.5–15.5)
WBC: 5.8 10*3/uL (ref 4.0–10.5)

## 2012-12-20 LAB — BASIC METABOLIC PANEL
CO2: 26 mEq/L (ref 19–32)
Chloride: 106 mEq/L (ref 96–112)
Creat: 0.85 mg/dL (ref 0.50–1.10)
Glucose, Bld: 84 mg/dL (ref 70–99)
Potassium: 3.8 mEq/L (ref 3.5–5.3)
Sodium: 143 mEq/L (ref 135–145)

## 2012-12-21 LAB — TB SKIN TEST
Induration: 0 mm
TB Skin Test: NEGATIVE

## 2013-01-15 NOTE — Assessment & Plan Note (Signed)
Improved butr still uncontrolled Hyperlipidemia:Low fat diet discussed and encouraged.  Updated lab in 6 month

## 2013-01-15 NOTE — Assessment & Plan Note (Signed)
Patient re-educated about  the importance of commitment to a  minimum of 150 minutes of exercise per week. The importance of healthy food choices with portion control discussed. Encouraged to start a food diary, count calories and to consider  joining a support group. Sample diet sheets offered. Goals set by the patient for the next several months.    

## 2013-01-15 NOTE — Assessment & Plan Note (Signed)
Patient educated about the importance of limiting  Carbohydrate intake , the need to commit to daily physical activity for a minimum of 30 minutes , and to commit weight loss. The fact that changes in all these areas will reduce or eliminate all together the development of diabetes is stressed.    

## 2013-01-15 NOTE — Assessment & Plan Note (Signed)
Controlled, no change in medication DASH diet and commitment to daily physical activity for a minimum of 30 minutes discussed and encouraged, as a part of hypertension management. The importance of attaining a healthy weight is also discussed.  

## 2013-01-15 NOTE — Assessment & Plan Note (Signed)
Positive f/h of glaucoma with poor vision, refer to opthalmology for eval

## 2013-01-16 ENCOUNTER — Ambulatory Visit (HOSPITAL_COMMUNITY)
Admission: RE | Admit: 2013-01-16 | Discharge: 2013-01-16 | Disposition: A | Discharge status: Home / Self Care | Payer: BC Managed Care – PPO | Source: Ambulatory Visit | Attending: Family Medicine | Admitting: Family Medicine

## 2013-01-16 DIAGNOSIS — Z139 Encounter for screening, unspecified: Secondary | ICD-10-CM

## 2013-01-16 DIAGNOSIS — Z1231 Encounter for screening mammogram for malignant neoplasm of breast: Secondary | ICD-10-CM | POA: Insufficient documentation

## 2013-02-14 ENCOUNTER — Other Ambulatory Visit: Payer: Self-pay

## 2013-02-14 ENCOUNTER — Telehealth: Payer: Self-pay | Admitting: Family Medicine

## 2013-02-14 ENCOUNTER — Other Ambulatory Visit: Payer: Self-pay | Admitting: Family Medicine

## 2013-02-15 NOTE — Telephone Encounter (Signed)
Med refilled.

## 2013-03-18 ENCOUNTER — Other Ambulatory Visit: Payer: Self-pay | Admitting: Family Medicine

## 2013-04-21 ENCOUNTER — Emergency Department (HOSPITAL_COMMUNITY)
Admission: EM | Admit: 2013-04-21 | Discharge: 2013-04-21 | Disposition: A | Discharge status: Home / Self Care | Payer: BC Managed Care – PPO | Attending: Emergency Medicine | Admitting: Emergency Medicine

## 2013-04-21 ENCOUNTER — Encounter (HOSPITAL_COMMUNITY): Payer: Self-pay | Admitting: Emergency Medicine

## 2013-04-21 ENCOUNTER — Emergency Department (HOSPITAL_COMMUNITY): Payer: BC Managed Care – PPO

## 2013-04-21 DIAGNOSIS — I10 Essential (primary) hypertension: Secondary | ICD-10-CM | POA: Insufficient documentation

## 2013-04-21 DIAGNOSIS — M25559 Pain in unspecified hip: Secondary | ICD-10-CM | POA: Insufficient documentation

## 2013-04-21 DIAGNOSIS — M25552 Pain in left hip: Secondary | ICD-10-CM

## 2013-04-21 DIAGNOSIS — Z8719 Personal history of other diseases of the digestive system: Secondary | ICD-10-CM | POA: Insufficient documentation

## 2013-04-21 DIAGNOSIS — E663 Overweight: Secondary | ICD-10-CM | POA: Insufficient documentation

## 2013-04-21 DIAGNOSIS — Z8673 Personal history of transient ischemic attack (TIA), and cerebral infarction without residual deficits: Secondary | ICD-10-CM | POA: Insufficient documentation

## 2013-04-21 DIAGNOSIS — J069 Acute upper respiratory infection, unspecified: Secondary | ICD-10-CM | POA: Insufficient documentation

## 2013-04-21 DIAGNOSIS — Z7982 Long term (current) use of aspirin: Secondary | ICD-10-CM | POA: Insufficient documentation

## 2013-04-21 DIAGNOSIS — Z8742 Personal history of other diseases of the female genital tract: Secondary | ICD-10-CM | POA: Insufficient documentation

## 2013-04-21 DIAGNOSIS — Z79899 Other long term (current) drug therapy: Secondary | ICD-10-CM | POA: Insufficient documentation

## 2013-04-21 MED ORDER — IBUPROFEN 800 MG PO TABS: 800.0000 mg | ORAL_TABLET | Freq: Three times a day (TID) | ORAL | Status: DC

## 2013-04-21 MED ORDER — ACETAMINOPHEN-CODEINE #3 300-30 MG PO TABS: 1.0000 | ORAL_TABLET | Freq: Four times a day (QID) | ORAL | Status: DC | PRN

## 2013-04-21 MED ORDER — IBUPROFEN 800 MG PO TABS
800.0000 mg | ORAL_TABLET | Freq: Once | ORAL | Status: DC
  Filled 2013-04-21: qty 1

## 2013-04-21 NOTE — ED Notes (Signed)
Complain of cough and chills

## 2013-04-21 NOTE — ED Notes (Signed)
At time for d/c, unable to locate pt.  Gown on stretcher.   Motrin was not given and did not receive d/c inst or Rx

## 2013-04-21 NOTE — ED Provider Notes (Signed)
CSN: [id number]     Arrival date & time 04/21/13  1342 History   First MD Initiated Contact with Patient 04/21/13 1544     Chief Complaint  Patient presents with  . Cough     (Consider location/radiation/quality/duration/timing/severity/associated sxs/prior Treatment) HPI Comments: Pt ia a 58 y/o female who presents to the ED with c/o body aches and generally not feeling well.  She reports chills and sweats. NO sore throat. No earache. Little nasal congestion. No constipation or diarrhea. This started 1.5 weeks ago. She also presents to the ED with pain in the left leg. No recent injury. No change in activity. NO reported trauma.  The history is provided by the patient.    Past Medical History  Diagnosis Date  . Hypertension   . Hepatic steatosis     By CT scan  . Uterine leiomyoma     By CT scan  . Overweight   . Hyperlipidemia   . Cerebral vascular disease     Carotid ultrasound in 10/2011: Mild plaque; tortuous vessels; no definite luminal obstruction.   Past Surgical History  Procedure Laterality Date  . Diagnostic laparoscopy  1978    Gynecologic problems  . Colonoscopy  12/2007    Negative screening study   Family History  Problem Relation Age of Onset  . Pancreatic cancer Mother 71    Diagnosed in 2009; currently in hospice pt. htn  . Hypertension Mother     + Sister x4  . Cancer Father 69    brain tumor  . Cancer Other     MGM side cervical cancer /MGM side  stomach cancer   . Colon cancer Neg Hx    History  Substance Use Topics  . Smoking status: Never Smoker   . Smokeless tobacco: Never Used  . Alcohol Use: No     Comment: Never Drink   OB History   Grav Para Term Preterm Abortions TAB SAB Ect Mult Living                 Review of Systems  Constitutional: Negative for activity change.       All ROS Neg except as noted in HPI  HENT: Positive for congestion. Negative for nosebleeds.   Eyes: Negative for photophobia and discharge.  Respiratory:  Positive for cough. Negative for shortness of breath and wheezing.   Cardiovascular: Negative for chest pain and palpitations.  Gastrointestinal: Negative for abdominal pain and blood in stool.  Genitourinary: Negative for dysuria, frequency and hematuria.  Musculoskeletal: Positive for arthralgias. Negative for back pain and neck pain.  Skin: Negative.   Neurological: Negative for dizziness, seizures and speech difficulty.  Psychiatric/Behavioral: Negative for hallucinations and confusion.      Allergies  Review of patient's allergies indicates no known allergies.  Home Medications   Current Outpatient Rx  Name  Route  Sig  Dispense  Refill  . amLODipine (NORVASC) 10 MG tablet   Oral   Take 10 mg by mouth daily.         Marland Kitchen aspirin EC 81 MG tablet   Oral   Take 81 mg by mouth daily.         Marland Kitchen aspirin 81 MG tablet   Oral   Take 81 mg by mouth daily.         . Calcium Carbonate-Vitamin D (CALCIUM 600/VITAMIN D) 600-400 MG-UNIT per chew tablet   Oral   Chew 1 tablet by mouth daily.           Marland Kitchen  losartan-hydrochlorothiazide (HYZAAR) 100-25 MG per tablet      TAKE ONE TABLET BY MOUTH ONCE DAILY   30 tablet   2   . Multiple Vitamin (MULTIVITAMIN) tablet   Oral   Take 1 tablet by mouth daily.            BP 131/67  Pulse 102  Temp(Src) 100.4 F (38 C)  Resp 20  Ht 5\' 4"  (1.626 m)  Wt 167 lb (75.751 kg)  BMI 28.65 kg/m2  SpO2 98% Physical Exam  Nursing note and vitals reviewed. Constitutional: She is oriented to person, place, and time. She appears well-developed and well-nourished.  Non-toxic appearance.  HENT:  Head: Normocephalic.  Right Ear: Tympanic membrane and external ear normal.  Left Ear: Tympanic membrane and external ear normal.  Eyes: EOM and lids are normal. Pupils are equal, round, and reactive to light.  Neck: Normal range of motion. Neck supple. Carotid bruit is not present.  Cardiovascular: Normal rate, regular rhythm, normal heart  sounds, intact distal pulses and normal pulses.   Pulmonary/Chest: Breath sounds normal. No respiratory distress.  Course breath sounds with few scattered rhonchi.  Abdominal: Soft. Bowel sounds are normal. There is no tenderness. There is no guarding.  Musculoskeletal: Normal range of motion.  Pain with range of motion of the lower back. No palpable step off of the lumbar spine area. Pain with range of motion of the left hip. No evidence for dislocation. No septic joint. Distal pulses are 2+ bilaterally. Capillary refill is less than 2 seconds.  Lymphadenopathy:       Head (right side): No submandibular adenopathy present.       Head (left side): No submandibular adenopathy present.    She has no cervical adenopathy.  Neurological: She is alert and oriented to person, place, and time. She has normal strength. No cranial nerve deficit or sensory deficit.  Skin: Skin is warm and dry.  Psychiatric: She has a normal mood and affect. Her speech is normal.    ED Course  Procedures (including critical care time) Labs Review Labs Reviewed - No data to display Imaging Review Dg Chest 2 View  04/21/2013   CLINICAL DATA:  Cough.  Chills.  EXAM: CHEST  2 VIEW  COMPARISON:  04/23/2011  FINDINGS: The heart size and mediastinal contours are within normal limits. Both lungs are clear. The visualized skeletal structures are unremarkable.  IMPRESSION: No active cardiopulmonary disease.   Electronically Signed   By: Earle Gell M.D.   On: 04/21/2013 14:14    EKG Interpretation  None  MDM Temperature is 100.4. Pulse rate is elevated at 102. Pulse oximetry is 98% on room air. Within normal limits by my interpretation. The chest x-ray is negative for acute event. The examination favors upper respiratory infection.  The patient has had problems with her back and hip in the past. The examination favors an exacerbation of the same. There is no evidence of a hot or septic joint, there is no evidence for  dislocation. The patient is ambulatory but with some discomfort.  The plan at this time is for the patient increase fluids, use Tylenol or ibuprofen for fever and aching. Patient has been advised to wash hands frequently. Patient is given a prescription for ibuprofen and Tylenol codeine for her hip and back. The patient is to see her primary physician on next week for evaluation and management of this problem.    Final diagnoses:  None    **I have reviewed nursing  notes, vital signs, and all appropriate lab and imaging results for this patient.Lenox Ahr, PA-C 04/22/13 1029

## 2013-04-21 NOTE — ED Notes (Signed)
Pt say she has had a cough for app 1 week, but says the cough is better. C/o pain lt hip and down her leg.

## 2013-04-21 NOTE — Discharge Instructions (Signed)
Your chest x-ray is negative for any acute changes. Review of your vital signs, and your examination is consistent with an upper respiratory infection. Your leg and hip pain may be related to degenerative changes. Please increase fluids. Wash hands frequently. Use ibuprofen 3 times daily with food. May use Tylenol codeine for more severe pain. Please wash hands frequently. Please see Dr. Moshe Cipro for additional evaluation and management. Upper Respiratory Infection, Adult An upper respiratory infection (URI) is also sometimes known as the common cold. The upper respiratory tract includes the nose, sinuses, throat, trachea, and bronchi. Bronchi are the airways leading to the lungs. Most people improve within 1 week, but symptoms can last up to 2 weeks. A residual cough may last even longer.  CAUSES Many different viruses can infect the tissues lining the upper respiratory tract. The tissues become irritated and inflamed and often become very moist. Mucus production is also common. A cold is contagious. You can easily spread the virus to others by oral contact. This includes kissing, sharing a glass, coughing, or sneezing. Touching your mouth or nose and then touching a surface, which is then touched by another person, can also spread the virus. SYMPTOMS  Symptoms typically develop 1 to 3 days after you come in contact with a cold virus. Symptoms vary from person to person. They may include:  Runny nose.  Sneezing.  Nasal congestion.  Sinus irritation.  Sore throat.  Loss of voice (laryngitis).  Cough.  Fatigue.  Muscle aches.  Loss of appetite.  Headache.  Low-grade fever. DIAGNOSIS  You might diagnose your own cold based on familiar symptoms, since most people get a cold 2 to 3 times a year. Your caregiver can confirm this based on your exam. Most importantly, your caregiver can check that your symptoms are not due to another disease such as strep throat, sinusitis, pneumonia, asthma,  or epiglottitis. Blood tests, throat tests, and X-rays are not necessary to diagnose a common cold, but they may sometimes be helpful in excluding other more serious diseases. Your caregiver will decide if any further tests are required. RISKS AND COMPLICATIONS  You may be at risk for a more severe case of the common cold if you smoke cigarettes, have chronic heart disease (such as heart failure) or lung disease (such as asthma), or if you have a weakened immune system. The very young and very old are also at risk for more serious infections. Bacterial sinusitis, middle ear infections, and bacterial pneumonia can complicate the common cold. The common cold can worsen asthma and chronic obstructive pulmonary disease (COPD). Sometimes, these complications can require emergency medical care and may be life-threatening. PREVENTION  The best way to protect against getting a cold is to practice good hygiene. Avoid oral or hand contact with people with cold symptoms. Wash your hands often if contact occurs. There is no clear evidence that vitamin C, vitamin E, echinacea, or exercise reduces the chance of developing a cold. However, it is always recommended to get plenty of rest and practice good nutrition. TREATMENT  Treatment is directed at relieving symptoms. There is no cure. Antibiotics are not effective, because the infection is caused by a virus, not by bacteria. Treatment may include:  Increased fluid intake. Sports drinks offer valuable electrolytes, sugars, and fluids.  Breathing heated mist or steam (vaporizer or shower).  Eating chicken soup or other clear broths, and maintaining good nutrition.  Getting plenty of rest.  Using gargles or lozenges for comfort.  Controlling fevers with  ibuprofen or acetaminophen as directed by your caregiver.  Increasing usage of your inhaler if you have asthma. Zinc gel and zinc lozenges, taken in the first 24 hours of the common cold, can shorten the  duration and lessen the severity of symptoms. Pain medicines may help with fever, muscle aches, and throat pain. A variety of non-prescription medicines are available to treat congestion and runny nose. Your caregiver can make recommendations and may suggest nasal or lung inhalers for other symptoms.  HOME CARE INSTRUCTIONS   Only take over-the-counter or prescription medicines for pain, discomfort, or fever as directed by your caregiver.  Use a warm mist humidifier or inhale steam from a shower to increase air moisture. This may keep secretions moist and make it easier to breathe.  Drink enough water and fluids to keep your urine clear or pale yellow.  Rest as needed.  Return to work when your temperature has returned to normal or as your caregiver advises. You may need to stay home longer to avoid infecting others. You can also use a face mask and careful hand washing to prevent spread of the virus. SEEK MEDICAL CARE IF:   After the first few days, you feel you are getting worse rather than better.  You need your caregiver's advice about medicines to control symptoms.  You develop chills, worsening shortness of breath, or brown or red sputum. These may be signs of pneumonia.  You develop yellow or brown nasal discharge or pain in the face, especially when you bend forward. These may be signs of sinusitis.  You develop a fever, swollen neck glands, pain with swallowing, or white areas in the back of your throat. These may be signs of strep throat. SEEK IMMEDIATE MEDICAL CARE IF:   You have a fever.  You develop severe or persistent headache, ear pain, sinus pain, or chest pain.  You develop wheezing, a prolonged cough, cough up blood, or have a change in your usual mucus (if you have chronic lung disease).  You develop sore muscles or a stiff neck. Document Released: 08/05/2000 Document Revised: 05/04/2011 Document Reviewed: 06/13/2010 [facility] Patient Information 2014 [place],  [place].  Arthralgia Your caregiver has diagnosed you as suffering from an arthralgia. Arthralgia means there is pain in a joint. This can come from many reasons including:  Bruising the joint which causes soreness (inflammation) in the joint.  Wear and tear on the joints which occur as we grow older (osteoarthritis).  Overusing the joint.  Various forms of arthritis.  Infections of the joint. Regardless of the cause of pain in your joint, most of these different pains respond to anti-inflammatory drugs and rest. The exception to this is when a joint is infected, and these cases are treated with antibiotics, if it is a bacterial infection. HOME CARE INSTRUCTIONS   Rest the injured area for as long as directed by your caregiver. Then slowly start using the joint as directed by your caregiver and as the pain allows. Crutches as directed may be useful if the ankles, knees or hips are involved. If the knee was splinted or casted, continue use and care as directed. If an stretchy or elastic wrapping bandage has been applied today, it should be removed and re-applied every 3 to 4 hours. It should not be applied tightly, but firmly enough to keep swelling down. Watch toes and feet for swelling, bluish discoloration, coldness, numbness or excessive pain. If any of these problems (symptoms) occur, remove the ace bandage and re-apply more  loosely. If these symptoms persist, contact your caregiver or return to this location.  For the first 24 hours, keep the injured extremity elevated on pillows while lying down.  Apply ice for 15-20 minutes to the sore joint every couple hours while awake for the first half day. Then 03-04 times per day for the first 48 hours. Put the ice in a plastic bag and place a towel between the bag of ice and your skin.  Wear any splinting, casting, elastic bandage applications, or slings as instructed.  Only take over-the-counter or prescription medicines for pain, discomfort, or  fever as directed by your caregiver. Do not use aspirin immediately after the injury unless instructed by your physician. Aspirin can cause increased bleeding and bruising of the tissues.  If you were given crutches, continue to use them as instructed and do not resume weight bearing on the sore joint until instructed. Persistent pain and inability to use the sore joint as directed for more than 2 to 3 days are warning signs indicating that you should see a caregiver for a follow-up visit as soon as possible. Initially, a hairline fracture (break in bone) may not be evident on X-rays. Persistent pain and swelling indicate that further evaluation, non-weight bearing or use of the joint (use of crutches or slings as instructed), or further X-rays are indicated. X-rays may sometimes not show a small fracture until a week or 10 days later. Make a follow-up appointment with your own caregiver or one to whom we have referred you. A radiologist (specialist in reading X-rays) may read your X-rays. Make sure you know how you are to obtain your X-ray results. Do not assume everything is normal if you do not hear from [place]. SEEK MEDICAL CARE IF: Bruising, swelling, or pain increases. SEEK IMMEDIATE MEDICAL CARE IF:   Your fingers or toes are numb or blue.  The pain is not responding to medications and continues to stay the same or get worse.  The pain in your joint becomes severe.  You develop a fever over 102 F (38.9 C).  It becomes impossible to move or use the joint. MAKE SURE YOU:   Understand these instructions.  Will watch your condition.  Will get help right away if you are not doing well or get worse. Document Released: 02/09/2005 Document Revised: 05/04/2011 Document Reviewed: 09/28/2007 [facility] Patient Information 2014 [place].

## 2013-04-22 NOTE — ED Provider Notes (Signed)
Medical screening examination/treatment/procedure(s) were performed by non-physician practitioner and as supervising physician I was immediately available for consultation/collaboration.   EKG Interpretation None       Nat Christen, MD 04/22/13 1550

## 2013-05-16 ENCOUNTER — Encounter: Payer: Self-pay | Admitting: Family Medicine

## 2013-05-16 ENCOUNTER — Ambulatory Visit (INDEPENDENT_AMBULATORY_CARE_PROVIDER_SITE_OTHER): Payer: BC Managed Care – PPO | Admitting: Family Medicine

## 2013-05-16 ENCOUNTER — Ambulatory Visit (HOSPITAL_COMMUNITY)
Admission: RE | Admit: 2013-05-16 | Discharge: 2013-05-16 | Disposition: A | Discharge status: Home / Self Care | Payer: BC Managed Care – PPO | Source: Ambulatory Visit | Attending: Family Medicine | Admitting: Family Medicine

## 2013-05-16 VITALS — BP 108/78 | HR 99 | Resp 16 | Wt 150.1 lb

## 2013-05-16 DIAGNOSIS — M5432 Sciatica, left side: Secondary | ICD-10-CM

## 2013-05-16 DIAGNOSIS — R29890 Loss of height: Secondary | ICD-10-CM | POA: Insufficient documentation

## 2013-05-16 DIAGNOSIS — I10 Essential (primary) hypertension: Secondary | ICD-10-CM

## 2013-05-16 DIAGNOSIS — M5410 Radiculopathy, site unspecified: Secondary | ICD-10-CM | POA: Insufficient documentation

## 2013-05-16 DIAGNOSIS — M5430 Sciatica, unspecified side: Secondary | ICD-10-CM

## 2013-05-16 DIAGNOSIS — N3090 Cystitis, unspecified without hematuria: Secondary | ICD-10-CM

## 2013-05-16 DIAGNOSIS — M545 Low back pain, unspecified: Secondary | ICD-10-CM | POA: Insufficient documentation

## 2013-05-16 DIAGNOSIS — M47817 Spondylosis without myelopathy or radiculopathy, lumbosacral region: Secondary | ICD-10-CM | POA: Insufficient documentation

## 2013-05-16 LAB — POCT URINALYSIS DIPSTICK
Bilirubin, UA: NEGATIVE
Blood, UA: NEGATIVE
Glucose, UA: NEGATIVE
KETONES UA: NEGATIVE
LEUKOCYTES UA: NEGATIVE
Nitrite, UA: NEGATIVE
Protein, UA: NEGATIVE
SPEC GRAV UA: 1.015
Urobilinogen, UA: 1
pH, UA: 6.5

## 2013-05-16 MED ORDER — KETOROLAC TROMETHAMINE 60 MG/2ML IM SOLN
60.0000 mg | Freq: Once | INTRAMUSCULAR | Status: AC
  Administered 2013-05-16: 60 mg via INTRAMUSCULAR

## 2013-05-16 MED ORDER — METHYLPREDNISOLONE ACETATE 80 MG/ML IJ SUSP
80.0000 mg | Freq: Once | INTRAMUSCULAR | Status: AC
  Administered 2013-05-16: 80 mg via INTRAMUSCULAR

## 2013-05-16 MED ORDER — PREDNISONE (PAK) 5 MG PO TABS
5.0000 mg | ORAL_TABLET | ORAL | Status: DC
Start: 2013-05-16 — End: 2013-07-18

## 2013-05-16 MED ORDER — GABAPENTIN 100 MG PO CAPS: 100.0000 mg | ORAL_CAPSULE | Freq: Every day | ORAL | Status: DC

## 2013-05-16 NOTE — Progress Notes (Signed)
   Subjective:    Patient ID: Faith Hood, female    DOB: 08-23-1955, 58 y.o.   MRN: [id number]  HPI 2.5 week h/o acute back pain rated at 9 which took her to the ED, radiating to left thigh and foot, now a 4, she was dx with URI , no improvement in her primary complaint. Denies incontinence of stool or urine, this is  the first episode of acute back pain like this. Also c/o generalized mlaise and fatigue, just doesn't feel good  \   Review of Systems See HPI Denies recent fever or chills. Denies sinus pressure, nasal congestion, ear pain or sore throat. Denies chest congestion, productive cough or wheezing. Denies chest pains, palpitations and leg swelling Denies abdominal pain, nausea, vomiting,diarrhea or constipation.   .  Denies headaches, seizures, numbness, or tingling. Denies depression, anxiety or insomnia. Denies skin break down or rash.        Objective:   Physical Exam BP 108/78  Pulse 99  Resp 16  Wt 150 lb 1.9 oz (68.094 kg)  SpO2 97% Patient alert and oriented and in no cardiopulmonary distress.  HEENT: No facial asymmetry, EOMI, no sinus tenderness,  oropharynx pink and moist.  Neck supple no adenopathy.  Chest: Clear to auscultation bilaterally.  CVS: S1, S2 no murmurs, no S3.  ABD: Soft non tender. Bowel sounds normal.  Ext: No edema  [id number]  ROM thoraco lumbar spine, , hips and knee, adequate ROM shoulders.  Skin: Intact, no ulcerations or rash noted.  Psych: Good eye contact, normal affect. Memory intact not anxious or depressed appearing.  CNS: CN 2-12 intact, power, tone and sensation normal throughout.        Assessment & Plan:  Back pain with left-sided sciatica Anti inflammatories iM and orally, xray of spine , call back in 2 weeks if no better  Hypertension Controlled, no change in medication   Cystitis Back pain and urinary frequency, normal UA, pt reassured no UTI

## 2013-05-16 NOTE — Patient Instructions (Signed)
F/u as before, call if you need me before. Urine is not infected You are treated for back pain with left sided sciatica  Take ibuprofen 800mg   that you already have 3 times daily for 1 weeks, and prednisone is sent in for 6 days.  Two injections which are anti inflammatory in office today. Gabapentin for nerve pain take one at bedtime as needed Use OTC zantac 150 mg one daily to help to protect your stomach if you have heartburn while taking the medication  Xray of low back today  If you continue to have a lot of symptoms after 2 weeks , pls call you will be referred for an MRI of your low back, or if you develop weakness or  numbness in the leg or incontinence of stool or urine please call or go to the ED as this would suggest severe nerve damage

## 2013-06-14 ENCOUNTER — Ambulatory Visit: Payer: BC Managed Care – PPO | Admitting: Family Medicine

## 2013-06-23 ENCOUNTER — Other Ambulatory Visit: Payer: Self-pay | Admitting: Family Medicine

## 2013-07-02 ENCOUNTER — Other Ambulatory Visit: Payer: Self-pay | Admitting: Family Medicine

## 2013-07-18 ENCOUNTER — Encounter (INDEPENDENT_AMBULATORY_CARE_PROVIDER_SITE_OTHER): Payer: Self-pay

## 2013-07-18 ENCOUNTER — Ambulatory Visit (INDEPENDENT_AMBULATORY_CARE_PROVIDER_SITE_OTHER): Payer: BC Managed Care – PPO | Admitting: Family Medicine

## 2013-07-18 ENCOUNTER — Encounter: Payer: Self-pay | Admitting: Family Medicine

## 2013-07-18 VITALS — BP 130/82 | HR 68 | Resp 16 | Ht 64.75 in | Wt 151.0 lb

## 2013-07-18 DIAGNOSIS — N3090 Cystitis, unspecified without hematuria: Secondary | ICD-10-CM | POA: Insufficient documentation

## 2013-07-18 DIAGNOSIS — R7309 Other abnormal glucose: Secondary | ICD-10-CM

## 2013-07-18 DIAGNOSIS — R7303 Prediabetes: Secondary | ICD-10-CM

## 2013-07-18 DIAGNOSIS — I10 Essential (primary) hypertension: Secondary | ICD-10-CM

## 2013-07-18 DIAGNOSIS — E663 Overweight: Secondary | ICD-10-CM

## 2013-07-18 DIAGNOSIS — R0989 Other specified symptoms and signs involving the circulatory and respiratory systems: Secondary | ICD-10-CM

## 2013-07-18 DIAGNOSIS — E785 Hyperlipidemia, unspecified: Secondary | ICD-10-CM

## 2013-07-18 NOTE — Progress Notes (Signed)
   Subjective:    Patient ID: Faith Hood, female    DOB: October 01, 1955, 58 y.o.   MRN: [id number]  HPI  The PT is here for follow up and re-evaluation of chronic medical conditions, medication management and review of any available recent lab and radiology data.  Preventive health is updated, specifically  Cancer screening and Immunization.   States sciatica for which I saw her in March is completely resolved ,and she feels much improved The PT denies any adverse reactions to current medications since the last visit.  There are no new concerns.  There are no specific complaints      Review of Systems See HPI Denies recent fever or chills. Denies sinus pressure, nasal congestion, ear pain or sore throat. Denies chest congestion, productive cough or wheezing. Denies chest pains, palpitations and leg swelling Denies abdominal pain, nausea, vomiting,diarrhea or constipation.   Denies dysuria, frequency, hesitancy or incontinence. Denies joint pain, swelling and limitation in mobility. Denies headaches, seizures, numbness, or tingling. Denies depression, anxiety or insomnia. Denies skin break down or rash.        Objective:   Physical Exam BP 130/82  Pulse 68  Resp 16  Ht 5' 4.75" (1.645 m)  Wt 151 lb (68.493 kg)  BMI 25.31 kg/m2  SpO2 99% Patient alert and oriented and in no cardiopulmonary distress.  HEENT: No facial asymmetry, EOMI, no sinus tenderness,  oropharynx pink and moist.  Neck supple no adenopathy.Carotid bruit  Chest: Clear to auscultation bilaterally.  CVS: S1, S2 no murmurs, no S3.  ABD: Soft non tender. Bowel sounds normal.  Ext: No edema  MS: Adequate ROM spine, shoulders, hips and knees.  Skin: Intact, no ulcerations or rash noted.  Psych: Good eye contact, normal affect. Memory intact not anxious or depressed appearing.  CNS: CN 2-12 intact, power, tone and sensation normal throughout.        Assessment & Plan:  Hypertension Controlled,  no change in medication DASH diet and commitment to daily physical activity for a minimum of 30 minutes discussed and encouraged, as a part of hypertension management. The importance of attaining a healthy weight is also discussed.   Back pain with left-sided sciatica resolved  Prediabetes Updated lab needed at/ before next visit. Patient educated about the importance of limiting  Carbohydrate intake , the need to commit to daily physical activity for a minimum of 30 minutes , and to commit weight loss. The fact that changes in all these areas will reduce or eliminate all together the development of diabetes is stressed.     Overweight Improved. Pt applauded on succesful weight loss through lifestyle change, and encouraged to continue same. Weight loss goal set for the next several months.   HYPERLIPIDEMIA Hyperlipidemia:Low fat diet discussed and encouraged.  Updated lab needed at/ before next visit.   Carotid bruit F/u carotid study recommended and past due, will refer for same

## 2013-07-18 NOTE — Assessment & Plan Note (Signed)
Controlled, no change in medication  

## 2013-07-18 NOTE — Patient Instructions (Addendum)
CPE  in early November, call if you need me before  Blood pressure is excellent  Please increase fruit and vegetable intake and commit to regular walking as we discussed  Ideal body wright for you is about 140 pounds  Fasting lipid, chem 7, hBA1C, TSH , CBC 12/21/2013 or after

## 2013-07-18 NOTE — Assessment & Plan Note (Signed)
F/u carotid study recommended and past due, will refer for same

## 2013-07-18 NOTE — Assessment & Plan Note (Signed)
Improved. Pt applauded on succesful weight loss through lifestyle change, and encouraged to continue same. Weight loss goal set for the next several months.  

## 2013-07-18 NOTE — Assessment & Plan Note (Signed)
Hyperlipidemia:Low fat diet discussed and encouraged.  Updated lab needed at/ before next visit.  

## 2013-07-18 NOTE — Assessment & Plan Note (Signed)
Controlled, no change in medication DASH diet and commitment to daily physical activity for a minimum of 30 minutes discussed and encouraged, as a part of hypertension management. The importance of attaining a healthy weight is also discussed.  

## 2013-07-18 NOTE — Assessment & Plan Note (Addendum)
Anti inflammatories iM and orally, xray of spine , call back in 2 weeks if no better

## 2013-07-18 NOTE — Assessment & Plan Note (Signed)
Back pain and urinary frequency, normal UA, pt reassured no UTI

## 2013-07-18 NOTE — Assessment & Plan Note (Signed)
Updated lab needed at/ before next visit. Patient educated about the importance of limiting  Carbohydrate intake , the need to commit to daily physical activity for a minimum of 30 minutes , and to commit weight loss. The fact that changes in all these areas will reduce or eliminate all together the development of diabetes is stressed.    

## 2013-07-18 NOTE — Assessment & Plan Note (Signed)
resolved 

## 2013-08-07 ENCOUNTER — Ambulatory Visit (HOSPITAL_COMMUNITY): Admission: RE | Admit: 2013-08-07 | Payer: BC Managed Care – PPO | Source: Ambulatory Visit

## 2013-08-08 ENCOUNTER — Telehealth: Payer: Self-pay | Admitting: Family Medicine

## 2013-08-08 NOTE — Telephone Encounter (Signed)
Noted  

## 2013-09-16 ENCOUNTER — Other Ambulatory Visit: Payer: Self-pay | Admitting: Family Medicine

## 2013-09-25 ENCOUNTER — Ambulatory Visit (HOSPITAL_COMMUNITY)
Admission: RE | Admit: 2013-09-25 | Discharge: 2013-09-25 | Disposition: A | Discharge status: Home / Self Care | Payer: BC Managed Care – PPO | Source: Ambulatory Visit | Attending: Family Medicine | Admitting: Family Medicine

## 2013-09-25 DIAGNOSIS — R0989 Other specified symptoms and signs involving the circulatory and respiratory systems: Secondary | ICD-10-CM

## 2013-10-06 ENCOUNTER — Other Ambulatory Visit: Payer: Self-pay | Admitting: Family Medicine

## 2013-10-10 ENCOUNTER — Other Ambulatory Visit: Payer: Self-pay

## 2013-10-10 MED ORDER — LOSARTAN POTASSIUM-HCTZ 100-25 MG PO TABS: ORAL_TABLET | ORAL | Status: DC

## 2014-01-29 ENCOUNTER — Telehealth: Payer: Self-pay | Admitting: Family Medicine

## 2014-01-29 DIAGNOSIS — I10 Essential (primary) hypertension: Secondary | ICD-10-CM

## 2014-01-29 DIAGNOSIS — R7303 Prediabetes: Secondary | ICD-10-CM

## 2014-01-29 DIAGNOSIS — E785 Hyperlipidemia, unspecified: Secondary | ICD-10-CM

## 2014-01-29 NOTE — Telephone Encounter (Signed)
Pt needs CPE and pap by end feb, labs are past due, she needs to  sched cpe CBc, fasting lipid, cmp, HBa1C and TSH these are past due Also call and offer flu vaccine

## 2014-01-30 NOTE — Telephone Encounter (Signed)
Called patient and left message for them to return call at the office   

## 2014-01-30 NOTE — Addendum Note (Signed)
Addended by: Eual Fines on: 01/30/2014 02:00 PM   Modules accepted: Orders

## 2014-01-30 NOTE — Telephone Encounter (Signed)
Scheduled and coming for nurse visit to get shot and given CPE appt and also will give lab order when she comes for shot

## 2014-01-31 ENCOUNTER — Ambulatory Visit (INDEPENDENT_AMBULATORY_CARE_PROVIDER_SITE_OTHER): Payer: BC Managed Care – PPO

## 2014-01-31 ENCOUNTER — Other Ambulatory Visit: Payer: Self-pay | Admitting: Family Medicine

## 2014-01-31 ENCOUNTER — Ambulatory Visit: Payer: BC Managed Care – PPO

## 2014-01-31 DIAGNOSIS — Z23 Encounter for immunization: Secondary | ICD-10-CM

## 2014-01-31 DIAGNOSIS — Z1231 Encounter for screening mammogram for malignant neoplasm of breast: Secondary | ICD-10-CM

## 2014-02-05 ENCOUNTER — Ambulatory Visit (HOSPITAL_COMMUNITY)
Admission: RE | Admit: 2014-02-05 | Discharge: 2014-02-05 | Disposition: A | Discharge status: Home / Self Care | Payer: BC Managed Care – PPO | Source: Ambulatory Visit | Attending: Family Medicine | Admitting: Family Medicine

## 2014-02-05 DIAGNOSIS — Z1231 Encounter for screening mammogram for malignant neoplasm of breast: Secondary | ICD-10-CM | POA: Diagnosis not present

## 2014-03-01 ENCOUNTER — Other Ambulatory Visit: Payer: Self-pay | Admitting: Family Medicine

## 2014-03-31 ENCOUNTER — Other Ambulatory Visit: Payer: Self-pay | Admitting: Family Medicine

## 2014-04-01 LAB — CBC WITH DIFFERENTIAL/PLATELET
Basophils Absolute: 0 10*3/uL (ref 0.0–0.1)
Basophils Relative: 0 % (ref 0–1)
EOS ABS: 0 10*3/uL (ref 0.0–0.7)
EOS PCT: 1 % (ref 0–5)
HEMATOCRIT: 36.6 % (ref 36.0–46.0)
HEMOGLOBIN: 12.2 g/dL (ref 12.0–15.0)
Lymphocytes Relative: 29 % (ref 12–46)
Lymphs Abs: 1.3 10*3/uL (ref 0.7–4.0)
MCH: 28.5 pg (ref 26.0–34.0)
MCHC: 33.3 g/dL (ref 30.0–36.0)
MCV: 85.5 fL (ref 78.0–100.0)
MONO ABS: 0.3 10*3/uL (ref 0.1–1.0)
MONOS PCT: 6 % (ref 3–12)
MPV: 9.6 fL (ref 9.4–12.4)
NEUTROS ABS: 2.9 10*3/uL (ref 1.7–7.7)
Neutrophils Relative %: 64 % (ref 43–77)
Platelets: 253 10*3/uL (ref 150–400)
RBC: 4.28 MIL/uL (ref 3.87–5.11)
RDW: 15.1 % (ref 11.5–15.5)
WBC: 4.6 10*3/uL (ref 4.0–10.5)

## 2014-04-01 LAB — COMPREHENSIVE METABOLIC PANEL
ALT: 19 U/L (ref 0–35)
AST: 25 U/L (ref 0–37)
Albumin: 4.1 g/dL (ref 3.5–5.2)
Alkaline Phosphatase: 74 U/L (ref 39–117)
BILIRUBIN TOTAL: 0.6 mg/dL (ref 0.2–1.2)
BUN: 10 mg/dL (ref 6–23)
CALCIUM: 10 mg/dL (ref 8.4–10.5)
CHLORIDE: 103 meq/L (ref 96–112)
CO2: 28 meq/L (ref 19–32)
CREATININE: 0.83 mg/dL (ref 0.50–1.10)
GLUCOSE: 79 mg/dL (ref 70–99)
Potassium: 3.7 mEq/L (ref 3.5–5.3)
Sodium: 139 mEq/L (ref 135–145)
Total Protein: 7.7 g/dL (ref 6.0–8.3)

## 2014-04-01 LAB — TSH: TSH: 6.347 u[IU]/mL — AB (ref 0.350–4.500)

## 2014-04-01 LAB — LIPID PANEL
Cholesterol: 186 mg/dL (ref 0–200)
HDL: 39 mg/dL — AB (ref >39)
LDL Cholesterol: 134 mg/dL — ABNORMAL HIGH (ref 0–99)
TRIGLYCERIDES: 66 mg/dL (ref <150)
Total CHOL/HDL Ratio: 4.8 Ratio
VLDL: 13 mg/dL (ref 0–40)

## 2014-04-01 LAB — HEMOGLOBIN A1C
HEMOGLOBIN A1C: 5.7 % — AB (ref <5.7)
Mean Plasma Glucose: 117 mg/dL — ABNORMAL HIGH (ref <117)

## 2014-04-02 LAB — T4, FREE: FREE T4: 0.67 ng/dL — AB (ref 0.80–1.80)

## 2014-04-02 LAB — T3: T3, Total: 145.3 ng/dL (ref 80.0–204.0)

## 2014-04-03 ENCOUNTER — Encounter: Payer: BC Managed Care – PPO | Admitting: Family Medicine

## 2014-04-04 ENCOUNTER — Ambulatory Visit (INDEPENDENT_AMBULATORY_CARE_PROVIDER_SITE_OTHER): Payer: BC Managed Care – PPO | Admitting: Family Medicine

## 2014-04-04 ENCOUNTER — Encounter: Payer: Self-pay | Admitting: Family Medicine

## 2014-04-04 VITALS — BP 132/82 | HR 65 | Resp 16 | Ht 64.75 in | Wt 158.8 lb

## 2014-04-04 DIAGNOSIS — R7303 Prediabetes: Secondary | ICD-10-CM

## 2014-04-04 DIAGNOSIS — E785 Hyperlipidemia, unspecified: Secondary | ICD-10-CM

## 2014-04-04 DIAGNOSIS — I10 Essential (primary) hypertension: Secondary | ICD-10-CM

## 2014-04-04 DIAGNOSIS — E038 Other specified hypothyroidism: Secondary | ICD-10-CM

## 2014-04-04 DIAGNOSIS — Z1211 Encounter for screening for malignant neoplasm of colon: Secondary | ICD-10-CM

## 2014-04-04 DIAGNOSIS — Z0000 Encounter for general adult medical examination without abnormal findings: Secondary | ICD-10-CM

## 2014-04-04 DIAGNOSIS — E039 Hypothyroidism, unspecified: Secondary | ICD-10-CM | POA: Insufficient documentation

## 2014-04-04 NOTE — Patient Instructions (Addendum)
F/u in  3.5 month, call if you need me before Fastuing lipid, chem 7 and EGFr, hBA1C and TSH in 3.5 month   You are referred for thyroid US after 3pm per your request  Please work on healthy habits to improve health as discussed  You will be started on a thyroid tablet one daily AFTER your [place]   Check with your insurance  Re coverage of prevnar , we will be happy to give this to you

## 2014-04-04 NOTE — Progress Notes (Signed)
   Subjective:    Patient ID: Faith Hood, female    DOB: 11-22-1955, 59 y.o.   MRN: [id number]  HPI Patient is in for pelvic and breast exam. Recent lab review reveals need for imaging of her thyroid gland prior to starting supplement , new dx of hypothyroid  Review of Systems See HPI     Objective:   Physical Exam  BP 132/82 mmHg  Pulse 65  Resp 16  Ht 5' 4.75" (1.645 m)  Wt 158 lb 12.8 oz (72.031 kg)  BMI 26.62 kg/m2  SpO2 97%   Pleasant well nourished female, alert and oriented x 3, in no cardio-pulmonary distress. Afebrile. HEENT No facial trauma or asymetry. Sinuses non tender.  Extra occullar muscles intact, pupils equally reactive to light. External ears normal, tympanic membranes clear. Oropharynx moist, no exudate, good dentition. Neck: supple, no adenopathy,JVD  .No bruits.thyromegaly present  Chest: Clear to ascultation bilaterally.No crackles or wheezes. Non tender to palpation  Breast: No asymetry,no masses or lumps. No tenderness. No nipple discharge or inversion. No axillary or supraclavicular adenopathy  Cardiovascular system; Heart sounds normal,  S1 and  S2 ,no S3.  No murmur, or thrill. Apical beat not displaced Peripheral pulses normal.  Abdomen: Soft, non tender, no organomegaly or masses. No bruits. Bowel sounds normal. No guarding, tenderness or rebound.  Rectal:  Normal sphincter tone. No mass.No rectal masses.  Guaiac negative stool.  GU: External genitalia normal female genitalia , female distribution of hair. No lesions. Urethral meatus normal in size, no  Prolapse, no lesions visibly  Present. Bladder non tender. Vagina pink and moist , with no visible lesions , discharge present . Adequate pelvic support no  cystocele or rectocele noted Cervix pink and appears healthy, no lesions or ulcerations noted, no discharge noted from os Uterus slightly enlarged, approx 10 weks, no adnexal masses, no cervical motion or adnexal  tenderness.   Musculoskeletal exam: Full ROM of spine, hips , shoulders and knees. No deformity ,swelling or crepitus noted. No muscle wasting or atrophy.   Neurologic: Cranial nerves 2 to 12 intact. Power, tone ,sensation and reflexes normal throughout. No disturbance in gait. No tremor.  Skin: Intact, no ulceration, erythema , scaling or rash noted. Pigmentation normal throughout  Psych; Normal mood and affect. Judgement and concentration normal       Assessment & Plan:  Annual physical exam Annual exam as documented. Counseling done  re healthy lifestyle involving commitment to 150 minutes exercise per week, heart healthy diet, and attaining healthy weight.The importance of adequate sleep also discussed. Regular seat belt use and home safety, is also discussed. Changes in health habits are decided on by the patient with goals and time frames  set for achieving them. Immunization and cancer screening needs are specifically addressed at this visit.    Hypothyroidism Needs thyroid US then will be started on replacement med   Special screening for malignant neoplasms, colon No palpable mass, heme negative stool

## 2014-04-08 DIAGNOSIS — Z1211 Encounter for screening for malignant neoplasm of colon: Secondary | ICD-10-CM | POA: Insufficient documentation

## 2014-04-08 NOTE — Assessment & Plan Note (Signed)

## 2014-04-08 NOTE — Assessment & Plan Note (Signed)
Needs thyroid US then will be started on replacement med

## 2014-04-08 NOTE — Assessment & Plan Note (Signed)
No palpable mass, heme negative stool

## 2014-04-09 ENCOUNTER — Ambulatory Visit (HOSPITAL_COMMUNITY)
Admission: RE | Admit: 2014-04-09 | Discharge: 2014-04-09 | Disposition: A | Discharge status: Home / Self Care | Payer: BC Managed Care – PPO | Source: Ambulatory Visit | Attending: Family Medicine | Admitting: Family Medicine

## 2014-04-09 DIAGNOSIS — E038 Other specified hypothyroidism: Secondary | ICD-10-CM | POA: Diagnosis not present

## 2014-04-10 ENCOUNTER — Other Ambulatory Visit: Payer: Self-pay | Admitting: Family Medicine

## 2014-04-10 MED ORDER — LEVOTHYROXINE SODIUM 25 MCG PO TABS: 25.0000 ug | ORAL_TABLET | Freq: Every day | ORAL | Status: DC

## 2014-04-11 LAB — POC HEMOCCULT BLD/STL (OFFICE/1-CARD/DIAGNOSTIC): FECAL OCCULT BLD: NEGATIVE

## 2014-04-11 NOTE — Addendum Note (Signed)
Addended by: Eual Fines on: 04/11/2014 09:25 AM   Modules accepted: Orders

## 2014-04-13 ENCOUNTER — Telehealth: Payer: Self-pay

## 2014-04-13 DIAGNOSIS — E038 Other specified hypothyroidism: Secondary | ICD-10-CM

## 2014-04-13 NOTE — Telephone Encounter (Signed)
-----   Message from Fayrene Helper, MD sent at 04/10/2014  8:59 AM EST ----- pls let her know that her thyroid gland is slightly enlarged, also she has a very small nodule in her left lobe , which does not need to be biopsied. I will re image next year. Needs to start synthroid  Tab once daily , I have already sent in to her pharmacy. Remind her first thing in the am, with water on empty stomach, NPO for 1 to 2 hrs after for best absorption  Needs rept TSH in 3.5 months, needs to sched appt for 3.5 months none scheduled. You need to order the tsh if not already done.  I tried to call her and left her a message. Med has been sent in so pls ensure that by the time you get her it is still on the shelf

## 2014-04-18 ENCOUNTER — Encounter: Payer: BC Managed Care – PPO | Admitting: Family Medicine

## 2014-05-27 ENCOUNTER — Other Ambulatory Visit: Payer: Self-pay | Admitting: Family Medicine

## 2014-06-06 ENCOUNTER — Other Ambulatory Visit: Payer: Self-pay | Admitting: Family Medicine

## 2014-08-12 ENCOUNTER — Other Ambulatory Visit: Payer: Self-pay | Admitting: Family Medicine

## 2014-08-13 ENCOUNTER — Ambulatory Visit: Payer: BC Managed Care – PPO | Admitting: Family Medicine

## 2014-08-14 LAB — LIPID PANEL
CHOL/HDL RATIO: 3.9 ratio
CHOLESTEROL: 171 mg/dL (ref 0–200)
HDL: 44 mg/dL — ABNORMAL LOW (ref >=46)
LDL Cholesterol: 109 mg/dL — ABNORMAL HIGH (ref 0–99)
Triglycerides: 90 mg/dL (ref <150)
VLDL: 18 mg/dL (ref 0–40)

## 2014-08-14 LAB — COMPLETE METABOLIC PANEL WITH GFR
ALBUMIN: 4.2 g/dL (ref 3.5–5.2)
ALK PHOS: 82 U/L (ref 39–117)
ALT: 16 U/L (ref 0–35)
AST: 20 U/L (ref 0–37)
BUN: 14 mg/dL (ref 6–23)
CHLORIDE: 106 meq/L (ref 96–112)
CO2: 29 mEq/L (ref 19–32)
Calcium: 9.8 mg/dL (ref 8.4–10.5)
Creat: 0.81 mg/dL (ref 0.50–1.10)
GFR, EST NON AFRICAN AMERICAN: 81 mL/min
GFR, Est African American: >89 mL/min
GLUCOSE: 87 mg/dL (ref 70–99)
POTASSIUM: 4.1 meq/L (ref 3.5–5.3)
Sodium: 142 mEq/L (ref 135–145)
TOTAL PROTEIN: 7.5 g/dL (ref 6.0–8.3)
Total Bilirubin: 0.5 mg/dL (ref 0.2–1.2)

## 2014-08-14 LAB — HEMOGLOBIN A1C
Hgb A1c MFr Bld: 5.6 % (ref <5.7)
Mean Plasma Glucose: 114 mg/dL (ref <117)

## 2014-08-14 LAB — TSH: TSH: 4.584 u[IU]/mL — AB (ref 0.350–4.500)

## 2014-08-15 ENCOUNTER — Ambulatory Visit (INDEPENDENT_AMBULATORY_CARE_PROVIDER_SITE_OTHER): Payer: BC Managed Care – PPO | Admitting: Family Medicine

## 2014-08-15 VITALS — BP 128/78 | HR 92 | Resp 14 | Ht 64.75 in | Wt 157.0 lb

## 2014-08-15 DIAGNOSIS — Z113 Encounter for screening for infections with a predominantly sexual mode of transmission: Secondary | ICD-10-CM

## 2014-08-15 DIAGNOSIS — E038 Other specified hypothyroidism: Secondary | ICD-10-CM

## 2014-08-15 DIAGNOSIS — I10 Essential (primary) hypertension: Secondary | ICD-10-CM

## 2014-08-15 DIAGNOSIS — R7309 Other abnormal glucose: Secondary | ICD-10-CM

## 2014-08-15 DIAGNOSIS — E785 Hyperlipidemia, unspecified: Secondary | ICD-10-CM | POA: Diagnosis not present

## 2014-08-15 DIAGNOSIS — R7303 Prediabetes: Secondary | ICD-10-CM

## 2014-08-15 MED ORDER — LEVOTHYROXINE SODIUM 25 MCG PO TABS: ORAL_TABLET | ORAL | Status: DC

## 2014-08-15 NOTE — Patient Instructions (Signed)
F/U with annual physical exam in 4.5 month, call if you need me before  Congrats normal blood sugar , and better cholesterol  Please work on good  health habits so that your health will improve. 1. Commitment to daily physical activity for 30 to 60  minutes, if you are able to do this.  2. Commitment to wise food choices. Aim for half of your  food intake to be vegetable and fruit, one quarter starchy foods, and one quarter protein. Try to eat on a regular schedule  3 meals per day, snacking between meals should be limited to vegetables or fruits or small portions of nuts. 64 ounces of water per day is generally recommended, unless you have specific health conditions, like heart failure or kidney failure where you will need to limit fluid intake.  3. Commitment to sufficient and a  good quality of physical and mental rest daily, generally between 6 to 8 hours per day.  WITH PERSISTANCE AND PERSEVERANCE, THE IMPOSSIBLE , BECOMES THE NORM!   Thanks for choosing [facility], we consider it a privelige to serve you.  INCREASE thyroid med to one and a half on Tuesday , Thursday and Saturday, continue one daily on the other days  Tsh in 4.5 month

## 2014-08-16 ENCOUNTER — Encounter: Payer: Self-pay | Admitting: Family Medicine

## 2014-08-16 NOTE — Assessment & Plan Note (Signed)
ImprovedPatient re-educated about  the importance of commitment to a  minimum of 150 minutes of exercise per week.  The importance of healthy food choices with portion control discussed. Encouraged to start a food diary, count calories and to consider  joining a support group. Sample diet sheets offered. Goals set by the patient for the next several months.   Weight /BMI 08/15/2014 04/04/2014 07/18/2013  WEIGHT 157 lb 158 lb 12.8 oz 151 lb  HEIGHT 5' 4.75" 5' 4.75" 5' 4.75"  BMI 26.32 kg/m2 26.62 kg/m2 25.31 kg/m2    Current exercise per week 210 minutes.

## 2014-08-16 NOTE — Assessment & Plan Note (Signed)
Patient educated about the importance of limiting  Carbohydrate intake , the need to commit to daily physical activity for a minimum of 30 minutes , and to commit weight loss. The fact that changes in all these areas will reduce or eliminate all together the development of diabetes is stressed.  Corrected with lifestyle change which is excellent.  Diabetic Labs Latest Ref Rng 08/13/2014 03/31/2014 12/20/2012 07/05/2012 11/13/2011  HbA1c <5.7 % 5.6 5.7(H) - 5.4 5.8(H)  Chol 0 - 200 mg/dL 171 186 192 197 211(H)  HDL >=46 mg/dL 44(L) 39(L) 41 42 41  Calc LDL 0 - 99 mg/dL 109(H) 134(H) 129(H) 138(H) 148(H)  Triglycerides <150 mg/dL 90 66 110 87 112  Creatinine 0.50 - 1.10 mg/dL 0.81 0.83 0.85 0.87 0.85   BP/Weight 08/15/2014 04/04/2014 07/18/2013 05/16/2013 04/21/2013 12/14/2012 3/55/9741  Systolic BP 638 453 646 803 212 248 250  Diastolic BP 78 82 82 78 67 80 70  Wt. (Lbs) 157 158.8 151 150.12 167 162 163.12  BMI 26.32 26.62 25.31 25.16 28.65 27.39 27.58   No flowsheet data found.     Diabetic Labs Latest Ref Rng 08/13/2014 03/31/2014 12/20/2012 07/05/2012 11/13/2011  HbA1c <5.7 % 5.6 5.7(H) - 5.4 5.8(H)  Chol 0 - 200 mg/dL 171 186 192 197 211(H)  HDL >=46 mg/dL 44(L) 39(L) 41 42 41  Calc LDL 0 - 99 mg/dL 109(H) 134(H) 129(H) 138(H) 148(H)  Triglycerides <150 mg/dL 90 66 110 87 112  Creatinine 0.50 - 1.10 mg/dL 0.81 0.83 0.85 0.87 0.85   BP/Weight 08/15/2014 04/04/2014 07/18/2013 05/16/2013 04/21/2013 12/14/2012 0/37/0488  Systolic BP 891 694 503 888 280 034 917  Diastolic BP 78 82 82 78 67 80 70  Wt. (Lbs) 157 158.8 151 150.12 167 162 163.12  BMI 26.32 26.62 25.31 25.16 28.65 27.39 27.58   No flowsheet data found.

## 2014-08-16 NOTE — Assessment & Plan Note (Signed)
Hyperlipidemia:Low fat diet discussed and encouraged. Improved  Lipid Panel  Lab Results  Component Value Date   CHOL 171 08/13/2014   HDL 44* 08/13/2014   LDLCALC 109* 08/13/2014   TRIG 90 08/13/2014   CHOLHDL 3.9 08/13/2014

## 2014-08-16 NOTE — Progress Notes (Signed)
Faith Hood     MRN: [id number]      DOB: 08-27-1955   HPI Faith Hood is here for follow up and re-evaluation of chronic medical conditions, medication management and review of any available recent lab and radiology data.  Preventive health is updated, specifically  Cancer screening and Immunization.   Questions or concerns regarding consultations or procedures which the PT has had in the interim are  addressed. The PT denies any adverse reactions to current medications since the last visit.  There are no new concerns.  There are no specific complaints   ROS Denies recent fever or chills. Denies sinus pressure, nasal congestion, ear pain or sore throat. Denies chest congestion, productive cough or wheezing. Denies chest pains, palpitations and leg swelling Denies abdominal pain, nausea, vomiting,diarrhea or constipation.   Denies dysuria, frequency, hesitancy or incontinence. Denies joint pain, swelling and limitation in mobility. Denies headaches, seizures, numbness, or tingling. Denies depression, anxiety intermittently has mild insomnia, depending on how much she has going on, not due due to negative influences in her life. Has comitted to improved health habits with excellent results Denies skin break down or rash.   PE  BP 128/78 mmHg  Pulse 92  Resp 14  Ht 5' 4.75" (1.645 m)  Wt 157 lb (71.215 kg)  BMI 26.32 kg/m2  SpO2 98%  Patient alert and oriented and in no cardiopulmonary distress.  HEENT: No facial asymmetry, EOMI,   oropharynx pink and moist.  Neck supple no JVD, no mass.  Chest: Clear to auscultation bilaterally.  CVS: S1, S2 no murmurs, no S3.Regular rate.  ABD: Soft non tender.   Ext: No edema  MS: Adequate ROM spine, shoulders, hips and knees.  Skin: Intact, no ulcerations or rash noted.  Psych: Good eye contact, normal affect. Memory intact not anxious or depressed appearing.  CNS: CN 2-12 intact, power,  normal throughout.no focal deficits  noted.   Assessment & Plan   Hypertension Controlled, no change in medication DASH diet and commitment to daily physical activity for a minimum of 30 minutes discussed and encouraged, as a part of hypertension management. The importance of attaining a healthy weight is also discussed.  BP/Weight 08/15/2014 04/04/2014 07/18/2013 05/16/2013 04/21/2013 12/14/2012 2/50/0370  Systolic BP 488 891 694 503 888 280 034  Diastolic BP 78 82 82 78 67 80 70  Wt. (Lbs) 157 158.8 151 150.12 167 162 163.12  BMI 26.32 26.62 25.31 25.16 28.65 27.39 27.58        Hypothyroidism Undercorrected, dose increase in synthroid, pt aware, rept lab in 5 month  Overweight ImprovedPatient re-educated about  the importance of commitment to a  minimum of 150 minutes of exercise per week.  The importance of healthy food choices with portion control discussed. Encouraged to start a food diary, count calories and to consider  joining a support group. Sample diet sheets offered. Goals set by the patient for the next several months.   Weight /BMI 08/15/2014 04/04/2014 07/18/2013  WEIGHT 157 lb 158 lb 12.8 oz 151 lb  HEIGHT 5' 4.75" 5' 4.75" 5' 4.75"  BMI 26.32 kg/m2 26.62 kg/m2 25.31 kg/m2    Current exercise per week 210 minutes.   Hyperlipidemia Hyperlipidemia:Low fat diet discussed and encouraged. Improved  Lipid Panel  Lab Results  Component Value Date   CHOL 171 08/13/2014   HDL 44* 08/13/2014   LDLCALC 109* 08/13/2014   TRIG 90 08/13/2014   CHOLHDL 3.9 08/13/2014

## 2014-08-16 NOTE — Assessment & Plan Note (Signed)
Undercorrected, dose increase in synthroid, pt aware, rept lab in 5 month

## 2014-08-16 NOTE — Assessment & Plan Note (Signed)
Controlled, no change in medication DASH diet and commitment to daily physical activity for a minimum of 30 minutes discussed and encouraged, as a part of hypertension management. The importance of attaining a healthy weight is also discussed.  BP/Weight 08/15/2014 04/04/2014 07/18/2013 05/16/2013 04/21/2013 12/14/2012 4/82/5003  Systolic BP 704 888 916 945 038 882 800  Diastolic BP 78 82 82 78 67 80 70  Wt. (Lbs) 157 158.8 151 150.12 167 162 163.12  BMI 26.32 26.62 25.31 25.16 28.65 27.39 27.58

## 2014-10-23 ENCOUNTER — Other Ambulatory Visit: Payer: Self-pay | Admitting: Family Medicine

## 2015-01-03 ENCOUNTER — Other Ambulatory Visit: Payer: Self-pay | Admitting: Family Medicine

## 2015-01-03 DIAGNOSIS — Z1231 Encounter for screening mammogram for malignant neoplasm of breast: Secondary | ICD-10-CM

## 2015-01-08 LAB — HIV ANTIBODY (ROUTINE TESTING W REFLEX): HIV 1&2 Ab, 4th Generation: NONREACTIVE

## 2015-01-08 LAB — TSH: TSH: 2.988 u[IU]/mL (ref 0.350–4.500)

## 2015-01-09 ENCOUNTER — Ambulatory Visit (INDEPENDENT_AMBULATORY_CARE_PROVIDER_SITE_OTHER): Payer: BC Managed Care – PPO | Admitting: Family Medicine

## 2015-01-09 ENCOUNTER — Encounter: Payer: Self-pay | Admitting: Family Medicine

## 2015-01-09 ENCOUNTER — Other Ambulatory Visit (HOSPITAL_COMMUNITY)
Admission: RE | Admit: 2015-01-09 | Discharge: 2015-01-09 | Disposition: A | Discharge status: Home / Self Care | Payer: BC Managed Care – PPO | Source: Ambulatory Visit | Attending: Family Medicine | Admitting: Family Medicine

## 2015-01-09 VITALS — BP 130/82 | HR 78 | Resp 16 | Ht 65.0 in | Wt 157.4 lb

## 2015-01-09 DIAGNOSIS — Z01419 Encounter for gynecological examination (general) (routine) without abnormal findings: Secondary | ICD-10-CM | POA: Diagnosis present

## 2015-01-09 DIAGNOSIS — E038 Other specified hypothyroidism: Secondary | ICD-10-CM | POA: Diagnosis not present

## 2015-01-09 DIAGNOSIS — Z124 Encounter for screening for malignant neoplasm of cervix: Secondary | ICD-10-CM

## 2015-01-09 DIAGNOSIS — I10 Essential (primary) hypertension: Secondary | ICD-10-CM | POA: Diagnosis not present

## 2015-01-09 DIAGNOSIS — Z23 Encounter for immunization: Secondary | ICD-10-CM | POA: Diagnosis not present

## 2015-01-09 DIAGNOSIS — E785 Hyperlipidemia, unspecified: Secondary | ICD-10-CM

## 2015-01-09 DIAGNOSIS — E663 Overweight: Secondary | ICD-10-CM

## 2015-01-09 DIAGNOSIS — R7303 Prediabetes: Secondary | ICD-10-CM

## 2015-01-09 DIAGNOSIS — Z1151 Encounter for screening for human papillomavirus (HPV): Secondary | ICD-10-CM | POA: Insufficient documentation

## 2015-01-09 DIAGNOSIS — Z1159 Encounter for screening for other viral diseases: Secondary | ICD-10-CM

## 2015-01-09 NOTE — Assessment & Plan Note (Signed)
Patient educated about the importance of limiting  Carbohydrate intake , the need to commit to daily physical activity for a minimum of 30 minutes , and to commit weight loss. The fact that changes in all these areas will reduce or eliminate all together the development of diabetes is stressed.   Diabetic Labs Latest Ref Rng 08/13/2014 03/31/2014 12/20/2012 07/05/2012 11/13/2011  HbA1c <5.7 % 5.6 5.7(H) - 5.4 5.8(H)  Chol 0 - 200 mg/dL 171 186 192 197 211(H)  HDL >=46 mg/dL 44(L) 39(L) 41 42 41  Calc LDL 0 - 99 mg/dL 109(H) 134(H) 129(H) 138(H) 148(H)  Triglycerides <150 mg/dL 90 66 110 87 112  Creatinine 0.50 - 1.10 mg/dL 0.81 0.83 0.85 0.87 0.85   BP/Weight 01/09/2015 08/15/2014 04/04/2014 07/18/2013 05/16/2013 04/21/2013 [id number]  Systolic BP [id number] 0000000 [id number] AB-123456789 [id number] [id number]  Diastolic BP 82 78 82 82 78 67 80  Wt. (Lbs) 157.4 157 158.8 151 150.12 167 162  BMI 26.19 26.32 26.62 25.31 25.16 28.65 27.39   No flowsheet data found.   Updated lab needed at/ before next visit.

## 2015-01-09 NOTE — Progress Notes (Signed)
Subjective:    Patient ID: Faith Hood, female    DOB: 1956-01-02, 59 y.o.   MRN: [id number]  HPI   Faith Hood     MRN: [id number]      DOB: 1955-10-13   HPI Faith Hood is here for follow up and re-evaluation of chronic medical conditions, medication management and review of any available recent lab and radiology data.  Preventive health is updated, specifically  Cancer screening and Immunization.   Questions or concerns regarding consultations or procedures which the PT has had in the interim are  addressed. The PT denies any adverse reactions to current medications since the last visit.  There are no new concerns.  There are no specific complaints   ROS Denies recent fever or chills. Denies sinus pressure, nasal congestion, ear pain or sore throat. Denies chest congestion, productive cough or wheezing. Denies chest pains, palpitations and leg swelling Denies abdominal pain, nausea, vomiting,diarrhea or constipation.   Denies dysuria, frequency, hesitancy or incontinence. Denies joint pain, swelling and limitation in mobility. Denies headaches, seizures, numbness, or tingling. Denies depression, anxiety or insomnia. Denies skin break down or rash.   PE  BP 130/82 mmHg  Pulse 78  Resp 16  Ht 5\' 5"  (1.651 m)  Wt 157 lb 6.4 oz (71.396 kg)  BMI 26.19 kg/m2  SpO2 98%  Patient alert and oriented and in no cardiopulmonary distress.  HEENT: No facial asymmetry, EOMI,   oropharynx pink and moist.  Neck supple no JVD, no mass.  Chest: Clear to auscultation bilaterally.  CVS: S1, S2 no murmurs, no S3.Regular rate.  ABD: Soft non tender.   Pelvic: uterus mildly enlarged, no adnexal mass palpable, Physiologic d/c , no cervical motion or adnexal tenderness Os closed and stenotic, pap attempted Ext: No edema  MS: Adequate ROM spine, shoulders, hips and knees.  Skin: Intact, no ulcerations or rash noted.  Psych: Good eye contact, normal affect. Memory intact not anxious or  depressed appearing.  CNS: CN 2-12 intact, power,  normal throughout.no focal deficits noted.   Assessment & Plan  Hypertension Controlled, no change in medication DASH diet and commitment to daily physical activity for a minimum of 30 minutes discussed and encouraged, as a part of hypertension management. The importance of attaining a healthy weight is also discussed.  BP/Weight 01/09/2015 08/15/2014 04/04/2014 07/18/2013 05/16/2013 04/21/2013 A999333  Systolic BP [id number] 0000000 [id number] AB-123456789 [id number] [id number]  Diastolic BP 82 78 82 82 78 67 80  Wt. (Lbs) 157.4 157 158.8 151 150.12 167 162  BMI 26.19 26.32 26.62 25.31 25.16 28.65 27.39        Hypothyroidism Controlled, no change in medication Updated lab needed at/ before next visit.   Prediabetes Patient educated about the importance of limiting  Carbohydrate intake , the need to commit to daily physical activity for a minimum of 30 minutes , and to commit weight loss. The fact that changes in all these areas will reduce or eliminate all together the development of diabetes is stressed.   Diabetic Labs Latest Ref Rng 08/13/2014 03/31/2014 12/20/2012 07/05/2012 11/13/2011  HbA1c <5.7 % 5.6 5.7(H) - 5.4 5.8(H)  Chol 0 - 200 mg/dL 171 186 192 197 211(H)  HDL >=46 mg/dL 44(L) 39(L) 41 42 41  Calc LDL 0 - 99 mg/dL 109(H) 134(H) 129(H) 138(H) 148(H)  Triglycerides <150 mg/dL 90 66 110 87 112  Creatinine 0.50 - 1.10 mg/dL 0.81 0.83 0.85 0.87 0.85   BP/Weight 01/09/2015 08/15/2014 04/04/2014 07/18/2013  05/16/2013 04/21/2013 [id number]  Systolic BP AB-123456789 0000000 [id number] AB-123456789 [id number] [id number]  Diastolic BP 82 78 82 82 78 67 80  Wt. (Lbs) 157.4 157 158.8 151 150.12 167 162  BMI 26.19 26.32 26.62 25.31 25.16 28.65 27.39   No flowsheet data found.   Updated lab needed at/ before next visit.   Hyperlipidemia Controlled, no change in medication, hope LDL lower when next checked Hyperlipidemia:Low fat diet discussed and encouraged.   Lipid Panel  Lab Results    Component Value Date   CHOL 171 08/13/2014   HDL 44* 08/13/2014   LDLCALC 109* 08/13/2014   TRIG 90 08/13/2014   CHOLHDL 3.9 08/13/2014      Updated lab needed at/ before next visit.   Overweight Unchanged Patient re-educated about  the importance of commitment to a  minimum of 150 minutes of exercise per week.  The importance of healthy food choices with portion control discussed. Encouraged to start a food diary, count calories and to consider  joining a support group. Sample diet sheets offered. Goals set by the patient for the next several months.   Weight /BMI 01/09/2015 08/15/2014 04/04/2014  WEIGHT 157 lb 6.4 oz 157 lb 158 lb 12.8 oz  HEIGHT 5\' 5"  5' 4.75" 5' 4.75"  BMI 26.19 kg/m2 26.32 kg/m2 26.62 kg/m2    Current exercise per week: 150  minutes.        Review of Systems     Objective:   Physical Exam        Assessment & Plan:

## 2015-01-09 NOTE — Assessment & Plan Note (Signed)
Unchanged Patient re-educated about  the importance of commitment to a  minimum of 150 minutes of exercise per week.  The importance of healthy food choices with portion control discussed. Encouraged to start a food diary, count calories and to consider  joining a support group. Sample diet sheets offered. Goals set by the patient for the next several months.   Weight /BMI 01/09/2015 08/15/2014 04/04/2014  WEIGHT 157 lb 6.4 oz 157 lb 158 lb 12.8 oz  HEIGHT 5\' 5"  5' 4.75" 5' 4.75"  BMI 26.19 kg/m2 26.32 kg/m2 26.62 kg/m2    Current exercise per week: 150  minutes.

## 2015-01-09 NOTE — Assessment & Plan Note (Signed)
Controlled, no change in medication Updated lab needed at/ before next visit.  

## 2015-01-09 NOTE — Assessment & Plan Note (Signed)
Controlled, no change in medication, hope LDL lower when next checked Hyperlipidemia:Low fat diet discussed and encouraged.   Lipid Panel  Lab Results  Component Value Date   CHOL 171 08/13/2014   HDL 44* 08/13/2014   LDLCALC 109* 08/13/2014   TRIG 90 08/13/2014   CHOLHDL 3.9 08/13/2014      Updated lab needed at/ before next visit.

## 2015-01-09 NOTE — Patient Instructions (Signed)
F/u in early April, call if you need me  Before  Flu vaccine and pap today  Thankful that you are feeling better with your exercise routine  Thyroid function is normal on current med as is your blood pressure, no med changes   Fasting lipid, cmp, TSH, CBC, Vit D , Hep C

## 2015-01-09 NOTE — Assessment & Plan Note (Signed)
Controlled, no change in medication DASH diet and commitment to daily physical activity for a minimum of 30 minutes discussed and encouraged, as a part of hypertension management. The importance of attaining a healthy weight is also discussed.  BP/Weight 01/09/2015 08/15/2014 04/04/2014 07/18/2013 05/16/2013 04/21/2013 [id number]  Systolic BP AB-123456789 0000000 [id number] AB-123456789 [id number] [id number]  Diastolic BP 82 78 82 82 78 67 80  Wt. (Lbs) 157.4 157 158.8 151 150.12 167 162  BMI 26.19 26.32 26.62 25.31 25.16 28.65 27.39

## 2015-01-14 LAB — CYTOLOGY - PAP

## 2015-01-28 ENCOUNTER — Other Ambulatory Visit: Payer: Self-pay | Admitting: Family Medicine

## 2015-02-08 ENCOUNTER — Ambulatory Visit (HOSPITAL_COMMUNITY): Payer: BC Managed Care – PPO

## 2015-02-11 ENCOUNTER — Ambulatory Visit (HOSPITAL_COMMUNITY)
Admission: RE | Admit: 2015-02-11 | Discharge: 2015-02-11 | Disposition: A | Discharge status: Home / Self Care | Payer: BC Managed Care – PPO | Source: Ambulatory Visit | Attending: Family Medicine | Admitting: Family Medicine

## 2015-02-11 DIAGNOSIS — Z1231 Encounter for screening mammogram for malignant neoplasm of breast: Secondary | ICD-10-CM | POA: Diagnosis not present

## 2015-02-27 ENCOUNTER — Other Ambulatory Visit: Payer: Self-pay | Admitting: Family Medicine

## 2015-02-28 ENCOUNTER — Other Ambulatory Visit: Payer: Self-pay

## 2015-02-28 MED ORDER — LOSARTAN POTASSIUM-HCTZ 100-25 MG PO TABS: 1.0000 | ORAL_TABLET | Freq: Every day | ORAL | Status: DC

## 2015-05-07 ENCOUNTER — Other Ambulatory Visit: Payer: Self-pay | Admitting: Family Medicine

## 2015-05-08 ENCOUNTER — Other Ambulatory Visit: Payer: Self-pay

## 2015-05-08 DIAGNOSIS — E038 Other specified hypothyroidism: Secondary | ICD-10-CM

## 2015-05-08 MED ORDER — LEVOTHYROXINE SODIUM 25 MCG PO TABS: ORAL_TABLET | ORAL | Status: DC

## 2015-05-27 ENCOUNTER — Ambulatory Visit (INDEPENDENT_AMBULATORY_CARE_PROVIDER_SITE_OTHER): Payer: BC Managed Care – PPO | Admitting: Family Medicine

## 2015-05-27 ENCOUNTER — Encounter: Payer: Self-pay | Admitting: Family Medicine

## 2015-05-27 VITALS — BP 132/68 | HR 66 | Resp 18 | Ht 65.0 in | Wt 158.0 lb

## 2015-05-27 DIAGNOSIS — R7303 Prediabetes: Secondary | ICD-10-CM | POA: Diagnosis not present

## 2015-05-27 DIAGNOSIS — Z1159 Encounter for screening for other viral diseases: Secondary | ICD-10-CM

## 2015-05-27 DIAGNOSIS — E038 Other specified hypothyroidism: Secondary | ICD-10-CM

## 2015-05-27 DIAGNOSIS — E663 Overweight: Secondary | ICD-10-CM

## 2015-05-27 DIAGNOSIS — I10 Essential (primary) hypertension: Secondary | ICD-10-CM

## 2015-05-27 DIAGNOSIS — E785 Hyperlipidemia, unspecified: Secondary | ICD-10-CM | POA: Diagnosis not present

## 2015-05-27 DIAGNOSIS — E559 Vitamin D deficiency, unspecified: Secondary | ICD-10-CM

## 2015-05-27 NOTE — Patient Instructions (Signed)
Annual physical nov 17 or shortly after  Fasting labs tHIS WEEK please Please work on good  health habits so that your health will improve. 1. Commitment to daily physical activity for 30 to 60  minutes, if you are able to do this.  2. Commitment to wise food choices. Aim for half of your  food intake to be vegetable and fruit, one quarter starchy foods, and one quarter protein. Try to eat on a regular schedule  3 meals per day, snacking between meals should be limited to vegetables or fruits or small portions of nuts. 64 ounces of water per day is generally recommended, unless you have specific health conditions, like heart failure or kidney failure where you will need to limit fluid intake.  3. Commitment to sufficient and a  good quality of physical and mental rest daily, generally between 6 to 8 hours per day.  WITH PERSISTANCE AND PERSEVERANCE, THE IMPOSSIBLE , BECOMES THE NORM!  Thank you  for choosing [facility]. We consider it a privelige to serve you.  Delivering excellent health care in a caring and  compassionate way is our goal.  Partnering with you,  so that together we can achieve this goal is our strategy.  Eight pound weight loss is our goal

## 2015-05-27 NOTE — Progress Notes (Signed)
Subjective:    Patient ID: Faith Hood, female    DOB: November 26, 1955, 60 y.o.   MRN: [id number]  HPI   Faith Hood     MRN: [id number]      DOB: [birth year]   HPI Faith Hood is here for follow up and re-evaluation of chronic medical conditions, medication management and review of any available recent lab and radiology data.  Preventive health is updated, specifically  Cancer screening and Immunization.   Questions or concerns regarding consultations or procedures which the PT has had in the interim are  addressed. The PT denies any adverse reactions to current medications since the last visit.  There are no new concerns.  There are no specific complaints   ROS Denies recent fever or chills. Denies sinus pressure, nasal congestion, ear pain or sore throat. Denies chest congestion, productive cough or wheezing. Denies chest pains, palpitations and leg swelling Denies abdominal pain, nausea, vomiting,diarrhea or constipation.   Denies dysuria, frequency, hesitancy or incontinence. Denies joint pain, swelling and limitation in mobility. Denies headaches, seizures, numbness, or tingling. Denies depression, anxiety or insomnia. Denies skin break down or rash.   PE  BP 132/68 mmHg  Pulse 66  Resp 18  Ht 5\' 5"  (1.651 m)  Wt 158 lb (71.668 kg)  BMI 26.29 kg/m2  SpO2 99%  Patient alert and oriented and in no cardiopulmonary distress.  HEENT: No facial asymmetry, EOMI,   oropharynx pink and moist.  Neck supple no JVD, no mass.  Chest: Clear to auscultation bilaterally.  CVS: S1, S2 no murmurs, no S3.Regular rate.  ABD: Soft non tender.   Ext: No edema  MS: Adequate ROM spine, shoulders, hips and knees.  Skin: Intact, no ulcerations or rash noted.  Psych: Good eye contact, normal affect. Memory intact not anxious or depressed appearing.  CNS: CN 2-12 intact, power,  normal throughout.no focal deficits noted.   Assessment & Plan  Hypertension Controlled, no change in  medication DASH diet and commitment to daily physical activity for a minimum of 30 minutes discussed and encouraged, as a part of hypertension management. The importance of attaining a healthy weight is also discussed.  BP/Weight 05/27/2015 01/09/2015 08/15/2014 04/04/2014 07/18/2013 05/16/2013 99991111  Systolic BP Q000111Q AB-123456789 0000000 Q000111Q AB-123456789 [id number]  Diastolic BP 68 82 78 82 82 78 67  Wt. (Lbs) 158 157.4 157 158.8 151 150.12 167  BMI 26.29 26.19 26.32 26.62 25.31 25.16 28.65        Hyperlipidemia Hyperlipidemia:Low fat diet discussed and encouraged.   Lipid Panel  Lab Results  Component Value Date   CHOL 171 08/13/2014   HDL 44* 08/13/2014   LDLCALC 109* 08/13/2014   TRIG 90 08/13/2014   CHOLHDL 3.9 08/13/2014      Updated lab needed .   Hypothyroidism Updated lab needed to determine level of control, she is asymptomatic and feels well.   Overweight Deteriorated. Patient re-educated about  the importance of commitment to a  minimum of 150 minutes of exercise per week.  The importance of healthy food choices with portion control discussed. Encouraged to start a food diary, count calories and to consider  joining a support group. Sample diet sheets offered. Goals set by the patient for the next several months.   Weight /BMI 05/27/2015 01/09/2015 08/15/2014  WEIGHT 158 lb 157 lb 6.4 oz 157 lb  HEIGHT 5\' 5"  5\' 5"  5' 4.75"  BMI 26.29 kg/m2 26.19 kg/m2 26.32 kg/m2    Current exercise per  week 90 minutes.   Prediabetes Patient educated about the importance of limiting  Carbohydrate intake , the need to commit to daily physical activity for a minimum of 30 minutes , and to commit weight loss. The fact that changes in all these areas will reduce or eliminate all together the development of diabetes is stressed. Updated lab needed.    Diabetic Labs Latest Ref Rng 08/13/2014 03/31/2014 12/20/2012 07/05/2012 11/13/2011  HbA1c <5.7 % 5.6 5.7(H) - 5.4 5.8(H)  Chol 0 - 200 mg/dL 171 186  192 197 211(H)  HDL >=46 mg/dL 44(L) 39(L) 41 42 41  Calc LDL 0 - 99 mg/dL 109(H) 134(H) 129(H) 138(H) 148(H)  Triglycerides <150 mg/dL 90 66 110 87 112  Creatinine 0.50 - 1.10 mg/dL 0.81 0.83 0.85 0.87 0.85   BP/Weight 05/27/2015 01/09/2015 08/15/2014 04/04/2014 07/18/2013 05/16/2013 99991111  Systolic BP Q000111Q AB-123456789 0000000 Q000111Q AB-123456789 [id number] A999333  Diastolic BP 68 82 78 82 82 78 67  Wt. (Lbs) 158 157.4 157 158.8 151 150.12 167  BMI 26.29 26.19 26.32 26.62 25.31 25.16 28.65   No flowsheet data found.           Review of Systems     Objective:   Physical Exam        Assessment & Plan:

## 2015-06-02 NOTE — Assessment & Plan Note (Signed)
Updated lab needed to determine level of control, she is asymptomatic and feels well.

## 2015-06-02 NOTE — Assessment & Plan Note (Signed)
Hyperlipidemia:Low fat diet discussed and encouraged.   Lipid Panel  Lab Results  Component Value Date   CHOL 171 08/13/2014   HDL 44* 08/13/2014   LDLCALC 109* 08/13/2014   TRIG 90 08/13/2014   CHOLHDL 3.9 08/13/2014      Updated lab needed .

## 2015-06-02 NOTE — Assessment & Plan Note (Signed)
Patient educated about the importance of limiting  Carbohydrate intake , the need to commit to daily physical activity for a minimum of 30 minutes , and to commit weight loss. The fact that changes in all these areas will reduce or eliminate all together the development of diabetes is stressed. Updated lab needed.    Diabetic Labs Latest Ref Rng 08/13/2014 03/31/2014 12/20/2012 07/05/2012 11/13/2011  HbA1c <5.7 % 5.6 5.7(H) - 5.4 5.8(H)  Chol 0 - 200 mg/dL 171 186 192 197 211(H)  HDL >=46 mg/dL 44(L) 39(L) 41 42 41  Calc LDL 0 - 99 mg/dL 109(H) 134(H) 129(H) 138(H) 148(H)  Triglycerides <150 mg/dL 90 66 110 87 112  Creatinine 0.50 - 1.10 mg/dL 0.81 0.83 0.85 0.87 0.85   BP/Weight 05/27/2015 01/09/2015 08/15/2014 04/04/2014 07/18/2013 05/16/2013 99991111  Systolic BP Q000111Q AB-123456789 0000000 Q000111Q AB-123456789 [id number] A999333  Diastolic BP 68 82 78 82 82 78 67  Wt. (Lbs) 158 157.4 157 158.8 151 150.12 167  BMI 26.29 26.19 26.32 26.62 25.31 25.16 28.65   No flowsheet data found.

## 2015-06-02 NOTE — Assessment & Plan Note (Signed)
Deteriorated. Patient re-educated about  the importance of commitment to a  minimum of 150 minutes of exercise per week.  The importance of healthy food choices with portion control discussed. Encouraged to start a food diary, count calories and to consider  joining a support group. Sample diet sheets offered. Goals set by the patient for the next several months.   Weight /BMI 05/27/2015 01/09/2015 08/15/2014  WEIGHT 158 lb 157 lb 6.4 oz 157 lb  HEIGHT 5\' 5"  5\' 5"  5' 4.75"  BMI 26.29 kg/m2 26.19 kg/m2 26.32 kg/m2    Current exercise per week 90 minutes.

## 2015-06-02 NOTE — Assessment & Plan Note (Signed)
Controlled, no change in medication DASH diet and commitment to daily physical activity for a minimum of 30 minutes discussed and encouraged, as a part of hypertension management. The importance of attaining a healthy weight is also discussed.  BP/Weight 05/27/2015 01/09/2015 08/15/2014 04/04/2014 07/18/2013 05/16/2013 99991111  Systolic BP Q000111Q AB-123456789 0000000 Q000111Q AB-123456789 [id number] A999333  Diastolic BP 68 82 78 82 82 78 67  Wt. (Lbs) 158 157.4 157 158.8 151 150.12 167  BMI 26.29 26.19 26.32 26.62 25.31 25.16 28.65

## 2015-06-08 ENCOUNTER — Other Ambulatory Visit: Payer: Self-pay | Admitting: Family Medicine

## 2015-09-16 LAB — CBC
HCT: 38.5 % (ref 35.0–45.0)
Hemoglobin: 12.8 g/dL (ref 11.7–15.5)
MCH: 28.5 pg (ref 27.0–33.0)
MCHC: 33.2 g/dL (ref 32.0–36.0)
MCV: 85.7 fL (ref 80.0–100.0)
MPV: 9.7 fL (ref 7.5–12.5)
Platelets: 269 10*3/uL (ref 140–400)
RBC: 4.49 MIL/uL (ref 3.80–5.10)
RDW: 14.5 % (ref 11.0–15.0)
WBC: 5 10*3/uL (ref 3.8–10.8)

## 2015-09-16 LAB — COMPREHENSIVE METABOLIC PANEL
ALBUMIN: 4.2 g/dL (ref 3.6–5.1)
ALT: 14 U/L (ref 6–29)
AST: 17 U/L (ref 10–35)
Alkaline Phosphatase: 83 U/L (ref 33–130)
BUN: 13 mg/dL (ref 7–25)
CO2: 29 mmol/L (ref 20–31)
CREATININE: 0.76 mg/dL (ref 0.50–1.05)
Calcium: 9.5 mg/dL (ref 8.6–10.4)
Chloride: 104 mmol/L (ref 98–110)
Glucose, Bld: 90 mg/dL (ref 65–99)
POTASSIUM: 3.7 mmol/L (ref 3.5–5.3)
Sodium: 141 mmol/L (ref 135–146)
TOTAL PROTEIN: 6.7 g/dL (ref 6.1–8.1)
Total Bilirubin: 0.5 mg/dL (ref 0.2–1.2)

## 2015-09-16 LAB — LIPID PANEL
CHOLESTEROL: 175 mg/dL (ref 125–200)
HDL: 42 mg/dL — AB (ref >=46)
LDL Cholesterol: 118 mg/dL (ref <130)
TRIGLYCERIDES: 74 mg/dL (ref <150)
Total CHOL/HDL Ratio: 4.2 Ratio (ref <=5.0)
VLDL: 15 mg/dL (ref <30)

## 2015-09-16 LAB — HEPATITIS C ANTIBODY: HCV AB: NEGATIVE

## 2015-09-16 LAB — HEMOGLOBIN A1C
Hgb A1c MFr Bld: 5.7 % — ABNORMAL HIGH (ref <5.7)
Mean Plasma Glucose: 117 mg/dL

## 2015-09-16 LAB — TSH: TSH: 1.6 m[IU]/L

## 2015-09-17 LAB — VITAMIN D 25 HYDROXY (VIT D DEFICIENCY, FRACTURES): Vit D, 25-Hydroxy: 35 ng/mL (ref 30–100)

## 2015-09-27 ENCOUNTER — Other Ambulatory Visit: Payer: Self-pay | Admitting: Family Medicine

## 2015-10-12 ENCOUNTER — Other Ambulatory Visit: Payer: Self-pay | Admitting: Family Medicine

## 2015-10-12 ENCOUNTER — Other Ambulatory Visit: Payer: Self-pay

## 2015-10-12 DIAGNOSIS — E038 Other specified hypothyroidism: Secondary | ICD-10-CM

## 2015-10-12 MED ORDER — LEVOTHYROXINE SODIUM 25 MCG PO TABS: ORAL_TABLET | ORAL | 4 refills | Status: DC

## 2015-10-31 ENCOUNTER — Other Ambulatory Visit: Payer: Self-pay | Admitting: Family Medicine

## 2015-11-09 ENCOUNTER — Other Ambulatory Visit: Payer: Self-pay | Admitting: Family Medicine

## 2015-11-09 DIAGNOSIS — E038 Other specified hypothyroidism: Secondary | ICD-10-CM

## 2015-12-12 ENCOUNTER — Telehealth: Payer: Self-pay | Admitting: Family Medicine

## 2015-12-12 NOTE — Telephone Encounter (Signed)
Lm to reschedule November apt

## 2016-01-13 ENCOUNTER — Other Ambulatory Visit: Payer: Self-pay | Admitting: Family Medicine

## 2016-01-13 ENCOUNTER — Encounter: Payer: BC Managed Care – PPO | Admitting: Family Medicine

## 2016-01-13 DIAGNOSIS — Z1231 Encounter for screening mammogram for malignant neoplasm of breast: Secondary | ICD-10-CM

## 2016-01-22 ENCOUNTER — Other Ambulatory Visit: Payer: Self-pay | Admitting: Family Medicine

## 2016-02-04 ENCOUNTER — Encounter: Payer: Self-pay | Admitting: Family Medicine

## 2016-02-04 ENCOUNTER — Ambulatory Visit (HOSPITAL_COMMUNITY)
Admission: RE | Admit: 2016-02-04 | Discharge: 2016-02-04 | Disposition: A | Discharge status: Home / Self Care | Payer: BC Managed Care – PPO | Source: Ambulatory Visit | Attending: Family Medicine | Admitting: Family Medicine

## 2016-02-04 ENCOUNTER — Ambulatory Visit (INDEPENDENT_AMBULATORY_CARE_PROVIDER_SITE_OTHER): Payer: BC Managed Care – PPO | Admitting: Family Medicine

## 2016-02-04 VITALS — BP 114/82 | HR 84 | Resp 16 | Ht 65.0 in | Wt 161.0 lb

## 2016-02-04 DIAGNOSIS — R102 Pelvic and perineal pain unspecified side: Secondary | ICD-10-CM

## 2016-02-04 DIAGNOSIS — E559 Vitamin D deficiency, unspecified: Secondary | ICD-10-CM | POA: Diagnosis not present

## 2016-02-04 DIAGNOSIS — Z23 Encounter for immunization: Secondary | ICD-10-CM | POA: Diagnosis not present

## 2016-02-04 DIAGNOSIS — I10 Essential (primary) hypertension: Secondary | ICD-10-CM | POA: Insufficient documentation

## 2016-02-04 DIAGNOSIS — Z1211 Encounter for screening for malignant neoplasm of colon: Secondary | ICD-10-CM

## 2016-02-04 DIAGNOSIS — R0602 Shortness of breath: Secondary | ICD-10-CM | POA: Insufficient documentation

## 2016-02-04 DIAGNOSIS — Z0000 Encounter for general adult medical examination without abnormal findings: Secondary | ICD-10-CM

## 2016-02-04 DIAGNOSIS — M5410 Radiculopathy, site unspecified: Secondary | ICD-10-CM

## 2016-02-04 DIAGNOSIS — R7303 Prediabetes: Secondary | ICD-10-CM | POA: Diagnosis not present

## 2016-02-04 LAB — POC HEMOCCULT BLD/STL (OFFICE/1-CARD/DIAGNOSTIC): Fecal Occult Blood, POC: NEGATIVE

## 2016-02-04 MED ORDER — METHYLPREDNISOLONE ACETATE 80 MG/ML IJ SUSP
80.0000 mg | Freq: Once | INTRAMUSCULAR | Status: AC
  Administered 2016-02-04: 80 mg via INTRAMUSCULAR

## 2016-02-04 MED ORDER — PREDNISONE 5 MG PO TABS: 5.0000 mg | ORAL_TABLET | Freq: Two times a day (BID) | ORAL | 0 refills | Status: AC

## 2016-02-04 MED ORDER — KETOROLAC TROMETHAMINE 60 MG/2ML IM SOLN
60.0000 mg | Freq: Once | INTRAMUSCULAR | Status: AC
  Administered 2016-02-04: 60 mg via INTRAMUSCULAR

## 2016-02-04 NOTE — Assessment & Plan Note (Signed)
1 month h/o shortness of breath and exertional fatigue, cXR and to r/o pulmonary disease and cardiology to re evaluate

## 2016-02-04 NOTE — Patient Instructions (Addendum)
F/u In 2 month, call if you need me sooner  Flu vaccine today  Labs today, hBa1C, TSH, chem 7 and vit D, CBC   CXR today and you are referred to cardiology re new fatigue and shortness of breath for past 1 month    You need pelvic US to evaluate pain and fullness in left pelvis as soon as possible  Pain in left leg likely from arthritis in your spine, X ray shows this, toradol 60 mg and  Depo medrol 80 mg iM in office, and 5 day course of prednisone sent in

## 2016-02-04 NOTE — Assessment & Plan Note (Signed)
1 month h/o left pelvic pain and left adnexal mass palpated, needs [place] asap

## 2016-02-04 NOTE — Assessment & Plan Note (Signed)
Uncontrolled.Toradol and depo medrol administered IM in the office , to be followed by a short course of oral prednisone a  

## 2016-02-04 NOTE — Assessment & Plan Note (Signed)

## 2016-02-04 NOTE — Progress Notes (Signed)
    Faith Hood     MRN: [id number]      DOB: [birth year]  HPI: Patient is in for annual physical exam. 1 month h/o fatigue and SOB 1 month h/o inguinal pain radiating down left leg to ankle, wants shots for thsi, has established arthritis in low back Immunization is reviewed , and  updated    PE:  BP 114/82   Pulse 84   Resp 16   Ht 5\' 5"  (1.651 m)   Wt 161 lb (73 kg)   SpO2 97%   BMI 26.79 kg/m   Pleasant  female, alert and oriented x 3, in no cardio-pulmonary distress. Afebrile. HEENT No facial trauma or asymetry. Sinuses non tender.  Extra occullar muscles intact, External ears normal, tympanic membranes clear. Oropharynx moist, no exudate. Neck: supple, no adenopathy,JVD or thyromegaly.No bruits.  Chest: Clear to ascultation bilaterally.No crackles or wheezes. Non tender to palpation  Breast: No asymetry,no masses or lumps. No tenderness. No nipple discharge or inversion. No axillary or supraclavicular adenopathy  Cardiovascular system; Heart sounds normal,  S1 and  S2 ,no S3.  No murmur, or thrill. Apical beat not displaced Peripheral pulses normal.  Abdomen: Soft, non tender, no organomegaly or masses. No bruits. Bowel sounds normal. No guarding, tenderness or rebound.  Rectal:  Normal sphincter tone. No rectal mass. Guaiac negative stool.  GU: External genitalia normal female genitalia , normal female distribution of hair. No lesions. Urethral meatus normal in size, no  Prolapse, no lesions visibly  Present. Bladder non tender. Vagina pink and moist , with no visible lesions , discharge present . Adequate pelvic support no  cystocele or rectocele noted Cervix pink and appears healthy, no lesions or ulcerations noted, no discharge noted from os Uterus normal size, possible left adnexal mass, no cervical motion  Tenderness.Left adnexal tenderness   Musculoskeletal exam: Full ROM of spine, hips , shoulders and knees. No deformity ,swelling or  crepitus noted. No muscle wasting or atrophy.   Neurologic: Cranial nerves 2 to 12 intact. Power, tone ,sensation and reflexes normal throughout. No disturbance in gait. No tremor.  Skin: Intact, no ulceration, erythema , scaling or rash noted. Pigmentation normal throughout  Psych; Normal mood and affect. Judgement and concentration normal   Assessment & Plan:  Annual physical exam Annual exam as documented. Counseling done  re healthy lifestyle involving commitment to 150 minutes exercise per week, heart healthy diet, and attaining healthy weight.The importance of adequate sleep also discussed. Regular seat belt use and home safety, is also discussed. Changes in health habits are decided on by the patient with goals and time frames  set for achieving them. Immunization and cancer screening needs are specifically addressed at this visit.   Pelvic pain in female 1 month h/o left pelvic pain and left adnexal mass palpated, needs [place] asap  Back pain with left-sided radiculopathy Uncontrolled.Toradol and depo medrol administered IM in the office , to be followed by a short course of oral prednisone a

## 2016-02-05 ENCOUNTER — Encounter: Payer: Self-pay | Admitting: Cardiovascular Disease

## 2016-02-05 LAB — BASIC METABOLIC PANEL
BUN: 18 mg/dL (ref 7–25)
CALCIUM: 9.7 mg/dL (ref 8.6–10.4)
CO2: 28 mmol/L (ref 20–31)
CREATININE: 0.7 mg/dL (ref 0.50–1.05)
Chloride: 104 mmol/L (ref 98–110)
GLUCOSE: 93 mg/dL (ref 65–99)
Potassium: 3.7 mmol/L (ref 3.5–5.3)
Sodium: 141 mmol/L (ref 135–146)

## 2016-02-05 LAB — CBC
HCT: 40.1 % (ref 35.0–45.0)
Hemoglobin: 13.3 g/dL (ref 11.7–15.5)
MCH: 28.8 pg (ref 27.0–33.0)
MCHC: 33.2 g/dL (ref 32.0–36.0)
MCV: 86.8 fL (ref 80.0–100.0)
MPV: 9.8 fL (ref 7.5–12.5)
PLATELETS: 269 10*3/uL (ref 140–400)
RBC: 4.62 MIL/uL (ref 3.80–5.10)
RDW: 14.6 % (ref 11.0–15.0)
WBC: 6.8 10*3/uL (ref 3.8–10.8)

## 2016-02-05 LAB — HEMOGLOBIN A1C
Hgb A1c MFr Bld: 5.4 % (ref <5.7)
Mean Plasma Glucose: 108 mg/dL

## 2016-02-05 LAB — VITAMIN D 25 HYDROXY (VIT D DEFICIENCY, FRACTURES): VIT D 25 HYDROXY: 27 ng/mL — AB (ref 30–100)

## 2016-02-05 LAB — TSH: TSH: 2.79 m[IU]/L

## 2016-02-05 NOTE — Progress Notes (Signed)
Its scheduled for Friday 02/07/16 at 1:30

## 2016-02-07 ENCOUNTER — Ambulatory Visit (HOSPITAL_COMMUNITY)
Admission: RE | Admit: 2016-02-07 | Discharge: 2016-02-07 | Disposition: A | Discharge status: Home / Self Care | Payer: BC Managed Care – PPO | Source: Ambulatory Visit | Attending: Family Medicine | Admitting: Family Medicine

## 2016-02-07 ENCOUNTER — Ambulatory Visit (HOSPITAL_COMMUNITY): Admission: RE | Admit: 2016-02-07 | Payer: BC Managed Care – PPO | Source: Ambulatory Visit

## 2016-02-07 DIAGNOSIS — R102 Pelvic and perineal pain: Secondary | ICD-10-CM | POA: Diagnosis present

## 2016-02-07 DIAGNOSIS — D259 Leiomyoma of uterus, unspecified: Secondary | ICD-10-CM | POA: Insufficient documentation

## 2016-02-12 ENCOUNTER — Encounter: Payer: Self-pay | Admitting: Cardiology

## 2016-02-12 ENCOUNTER — Ambulatory Visit (HOSPITAL_COMMUNITY)
Admission: RE | Admit: 2016-02-12 | Discharge: 2016-02-12 | Disposition: A | Discharge status: Home / Self Care | Payer: BC Managed Care – PPO | Source: Ambulatory Visit | Attending: Family Medicine | Admitting: Family Medicine

## 2016-02-12 DIAGNOSIS — Z1231 Encounter for screening mammogram for malignant neoplasm of breast: Secondary | ICD-10-CM | POA: Diagnosis present

## 2016-03-23 NOTE — Progress Notes (Signed)
Cardiology Office Note  Date: 03/24/2016   ID: Faith Hood August 06, 1955, MRN [id number]  PCP: Faith Nakayama, MD  Consulting Cardiologist: Faith Lesches, MD   Chief Complaint  Patient presents with  . Dyspnea on exertion    History of Present Illness: Faith Hood is a 61 y.o. female referred for cardiology consultation by Dr. Moshe Hood. She reports a feeling of fatigue and dyspnea on exertion, more notable over the last month. Prior to this she had been exercising at the gym more regularly but has cut back. She does not endorse any exertional chest pain. Sometimes she feels a sharp, brief chest discomfort but it is not related to exertion.  Records indicate previous cardiology evaluation by Dr. Lattie Hood in 2013. She underwent an exercise echocardiogram at that time that was negative for ischemia. I reviewed her ECG today which shows sinus arrhythmia with R' in lead V1 and low voltage.  We went over her medications which are outlined below. No recent additions. Antihypertensive regimen has been stable.  He is retired, previously a Engineer, mining and social studies Pharmacist, hospital.  Past Medical History:  Diagnosis Date  . Cerebral vascular disease    Carotid ultrasound in 10/2011: Mild plaque; tortuous vessels; no definite luminal obstruction.  . Hepatic steatosis    By CT scan  . Hyperlipidemia   . Hypertension   . Overweight(278.02)   . Uterine leiomyoma    By CT scan    Past Surgical History:  Procedure Laterality Date  . COLONOSCOPY  12/2007   Negative screening study  . DIAGNOSTIC LAPAROSCOPY  1978   Gynecologic problems    Current Outpatient Prescriptions  Medication Sig Dispense Refill  . amLODipine (NORVASC) 10 MG tablet TAKE ONE TABLET BY MOUTH ONCE DAILY 30 tablet 3  . aspirin EC 81 MG tablet Take 81 mg by mouth daily.    Marland Kitchen levothyroxine (SYNTHROID, LEVOTHROID) 25 MCG tablet TAKE ONE TABLET BY MOUTH ON MONDAY, WEDNESDAY, FRIDAY AND SUNDAY. TAKE ONE  AND ONE-HALF TABLET ON TUESDAY, THURSDAY AND SATURDAY. 36 tablet 4  . losartan-hydrochlorothiazide (HYZAAR) 100-25 MG tablet TAKE ONE TABLET BY MOUTH ONCE DAILY 90 tablet 1  . Calcium Carbonate-Vitamin D (CALCIUM 600/VITAMIN D) 600-400 MG-UNIT per chew tablet Chew 1 tablet by mouth daily.      . Multiple Vitamin (MULTIVITAMIN) tablet Take 1 tablet by mouth daily.       No current facility-administered medications for this visit.    Allergies:  Patient has no known allergies.   Social History: The patient  reports that she has never smoked. She has never used smokeless tobacco. She reports that she does not drink alcohol or use drugs.   Family History: The patient's family history includes Cancer in her other; Cancer (age of onset: 49) in her father; Hypertension in her mother; Pancreatic cancer (age of onset: 70) in her mother.   ROS:  Please see the history of present illness. Otherwise, complete review of systems is positive for none.  All other systems are reviewed and negative.   Physical Exam: VS:  BP 116/64 (BP Location: Right Arm)   Pulse 63   Ht 5\' 2"  (1.575 m)   Wt 163 lb (73.9 kg)   SpO2 97%   BMI 29.81 kg/m , BMI Body mass index is 29.81 kg/m.  Wt Readings from Last 3 Encounters:  03/24/16 163 lb (73.9 kg)  02/04/16 161 lb (73 kg)  05/27/15 158 lb (71.7 kg)    General:  Patient appears comfortable at rest. HEENT: Conjunctiva and lids normal, oropharynx clear. Neck: Supple, no elevated JVP or carotid bruits, no thyromegaly. Lungs: Clear to auscultation, nonlabored breathing at rest. Cardiac: Regular rate and rhythm, no S3 or significant systolic murmur, no pericardial rub. Abdomen: Soft, nontender, bowel sounds present, no guarding or rebound. Extremities: No pitting edema, distal pulses 2+. Skin: Warm and dry. Musculoskeletal: No kyphosis. Neuropsychiatric: Alert and oriented x3, affect grossly appropriate.  ECG: I personally reviewed the tracing from 11/10/2011  which showed normal sinus rhythm and decreased R wave progression, nonspecific ST-T changes.  Recent Labwork: 09/16/2015: ALT 14; AST 17 02/04/2016: BUN 18; Creat 0.70; Hemoglobin 13.3; Platelets 269; Potassium 3.7; Sodium 141; TSH 2.79     Component Value Date/Time   CHOL 175 09/16/2015 0802   TRIG 74 09/16/2015 0802   HDL 42 (L) 09/16/2015 0802   CHOLHDL 4.2 09/16/2015 0802   VLDL 15 09/16/2015 0802   LDLCALC 118 09/16/2015 0802    Other Studies Reviewed Today:  Exercise echocardiogram 12/08/2011: Study Conclusions  - Stress: There was a hypertensive response to stress. - Stress ECG conclusions: No diagnostic ST changes or arrhythmias. The stress ECG was negative for ischemia. - Staged echo: There was no echocardiographic evidence for stress-induced ischemia.  Assessment and Plan:  1. Exertional dyspnea and fatigue. Atypical chest pain reported more sporadically without relation to exertion. ECG is nonspecific. She does have cardiac risk factors including hypertension and hyperlipidemia. She has not undergone ischemic testing within the last 4-5 years. We will plan a follow-up exercise echocardiogram for reassessment.  2. Hyperlipidemia, managed by diet per Dr. Moshe Hood. Last LDL 118.  3. Essential hypertension, on Norvasc and Hyzaar. Blood pressure well controlled today.  4. History of mild carotid atherosclerosis based on prior workup. Asymptomatic. She is currently on aspirin. Would also suggest statin for aggressive management of lipids.  Current medicines were reviewed with the patient today.   Orders Placed This Encounter  Procedures  . EKG 12-Lead  . ECHOCARDIOGRAM STRESS TEST    Disposition: Call with test results.  Signed, Satira Sark, MD, [facility] 03/24/2016 3:05 PM    [facility] at [facility] [address], [place], [place] [address] Phone: [phone]; Fax: [phone]

## 2016-03-24 ENCOUNTER — Encounter: Payer: Self-pay | Admitting: Cardiology

## 2016-03-24 ENCOUNTER — Ambulatory Visit (INDEPENDENT_AMBULATORY_CARE_PROVIDER_SITE_OTHER): Payer: BC Managed Care – PPO | Admitting: Cardiology

## 2016-03-24 VITALS — BP 116/64 | HR 63 | Ht 62.0 in | Wt 163.0 lb

## 2016-03-24 DIAGNOSIS — E782 Mixed hyperlipidemia: Secondary | ICD-10-CM

## 2016-03-24 DIAGNOSIS — R0609 Other forms of dyspnea: Secondary | ICD-10-CM | POA: Diagnosis not present

## 2016-03-24 DIAGNOSIS — R0789 Other chest pain: Secondary | ICD-10-CM | POA: Diagnosis not present

## 2016-03-24 DIAGNOSIS — I10 Essential (primary) hypertension: Secondary | ICD-10-CM

## 2016-03-24 DIAGNOSIS — R0602 Shortness of breath: Secondary | ICD-10-CM | POA: Diagnosis not present

## 2016-03-24 NOTE — Patient Instructions (Signed)
Your physician recommends that you schedule a follow-up appointment in: to be determined after your test, we will call you with results.    Your physician has requested that you have a stress echocardiogram. For further information please visit [email or web address]. Please follow instruction sheet as given.    Your physician recommends that you continue on your current medications as directed. Please refer to the Current Medication list given to you today.      Thank you for choosing [facility] !

## 2016-04-06 ENCOUNTER — Ambulatory Visit: Payer: BC Managed Care – PPO | Admitting: Family Medicine

## 2016-04-06 ENCOUNTER — Encounter: Payer: Self-pay | Admitting: Family Medicine

## 2016-04-07 ENCOUNTER — Ambulatory Visit (HOSPITAL_COMMUNITY)
Admission: RE | Admit: 2016-04-07 | Discharge: 2016-04-07 | Disposition: A | Discharge status: Home / Self Care | Payer: BC Managed Care – PPO | Source: Ambulatory Visit | Attending: Cardiology | Admitting: Cardiology

## 2016-04-07 ENCOUNTER — Other Ambulatory Visit: Payer: Self-pay | Admitting: Family Medicine

## 2016-04-07 DIAGNOSIS — R0789 Other chest pain: Secondary | ICD-10-CM | POA: Insufficient documentation

## 2016-04-07 DIAGNOSIS — R0609 Other forms of dyspnea: Secondary | ICD-10-CM | POA: Insufficient documentation

## 2016-04-07 LAB — ECHOCARDIOGRAM STRESS TEST
CHL CUP MPHR: 162 {beats}/min
CHL CUP RESTING HR STRESS: 56 {beats}/min
CHL RATE OF PERCEIVED EXERTION: 14
CSEPEDS: 7 s
CSEPEW: 13.4 METS
Exercise duration (min): 10 min
Peak HR: 162 {beats}/min
Percent HR: 100 %

## 2016-04-07 NOTE — Progress Notes (Signed)
*  PRELIMINARY RESULTS* Echocardiogram Stress test has been performed.  Leavy Cella 04/07/2016, 10:26 AM

## 2016-04-10 ENCOUNTER — Other Ambulatory Visit (HOSPITAL_COMMUNITY): Payer: BC Managed Care – PPO

## 2016-05-13 ENCOUNTER — Telehealth: Payer: Self-pay

## 2016-05-13 DIAGNOSIS — R7303 Prediabetes: Secondary | ICD-10-CM

## 2016-05-13 DIAGNOSIS — E785 Hyperlipidemia, unspecified: Secondary | ICD-10-CM

## 2016-05-13 DIAGNOSIS — I10 Essential (primary) hypertension: Secondary | ICD-10-CM

## 2016-05-13 NOTE — Telephone Encounter (Signed)
Labs ordered.

## 2016-05-16 LAB — LIPID PANEL
CHOLESTEROL: 181 mg/dL (ref <200)
HDL: 45 mg/dL — ABNORMAL LOW (ref >50)
LDL CALC: 120 mg/dL — AB (ref <100)
TRIGLYCERIDES: 79 mg/dL (ref <150)
Total CHOL/HDL Ratio: 4 Ratio (ref <5.0)
VLDL: 16 mg/dL (ref <30)

## 2016-05-16 LAB — COMPLETE METABOLIC PANEL WITH GFR
ALBUMIN: 4.3 g/dL (ref 3.6–5.1)
ALK PHOS: 83 U/L (ref 33–130)
ALT: 17 U/L (ref 6–29)
AST: 21 U/L (ref 10–35)
BUN: 15 mg/dL (ref 7–25)
CO2: 29 mmol/L (ref 20–31)
Calcium: 9.6 mg/dL (ref 8.6–10.4)
Chloride: 103 mmol/L (ref 98–110)
Creat: 0.86 mg/dL (ref 0.50–1.05)
GFR, EST NON AFRICAN AMERICAN: 75 mL/min (ref >=60)
GFR, Est African American: 86 mL/min (ref >=60)
Glucose, Bld: 79 mg/dL (ref 65–99)
POTASSIUM: 3.3 mmol/L — AB (ref 3.5–5.3)
SODIUM: 142 mmol/L (ref 135–146)
TOTAL PROTEIN: 7.4 g/dL (ref 6.1–8.1)
Total Bilirubin: 0.5 mg/dL (ref 0.2–1.2)

## 2016-05-16 LAB — HEMOGLOBIN A1C
Hgb A1c MFr Bld: 5.4 % (ref <5.7)
Mean Plasma Glucose: 108 mg/dL

## 2016-05-18 ENCOUNTER — Encounter: Payer: Self-pay | Admitting: Family Medicine

## 2016-05-18 ENCOUNTER — Ambulatory Visit (INDEPENDENT_AMBULATORY_CARE_PROVIDER_SITE_OTHER): Payer: BC Managed Care – PPO | Admitting: Family Medicine

## 2016-05-18 VITALS — BP 114/78 | HR 66 | Resp 16 | Ht 62.0 in | Wt 164.0 lb

## 2016-05-18 DIAGNOSIS — E038 Other specified hypothyroidism: Secondary | ICD-10-CM | POA: Diagnosis not present

## 2016-05-18 DIAGNOSIS — R0989 Other specified symptoms and signs involving the circulatory and respiratory systems: Secondary | ICD-10-CM | POA: Diagnosis not present

## 2016-05-18 DIAGNOSIS — E663 Overweight: Secondary | ICD-10-CM

## 2016-05-18 DIAGNOSIS — E785 Hyperlipidemia, unspecified: Secondary | ICD-10-CM | POA: Diagnosis not present

## 2016-05-18 DIAGNOSIS — I10 Essential (primary) hypertension: Secondary | ICD-10-CM

## 2016-05-18 MED ORDER — POTASSIUM CHLORIDE CRYS ER 20 MEQ PO TBCR: 20.0000 meq | EXTENDED_RELEASE_TABLET | Freq: Every day | ORAL | 1 refills | Status: DC

## 2016-05-18 NOTE — Assessment & Plan Note (Addendum)
re evaluation of possible right carotid bruit is past due will schedule

## 2016-05-18 NOTE — Patient Instructions (Addendum)
F/U in 6 month, call if you need me before   You re referred for [place] of carotid arteries  6 to 8 pound weight loss goal, pls increase exercise and reduce fried and fatty foods   New is once daily potassium  Fastingchem 7, TSH 1 week before f/u  CONGRATS on normal blood sugar    Please work on good  health habits so that your health will improve. 1. Commitment to daily physical activity for 30 to 60  minutes, if you are able to do this.  2. Commitment to wise food choices. Aim for half of your  food intake to be vegetable and fruit, one quarter starchy foods, and one quarter protein. Try to eat on a regular schedule  3 meals per day, snacking between meals should be limited to vegetables or fruits or small portions of nuts. 64 ounces of water per day is generally recommended, unless you have specific health conditions, like heart failure or kidney failure where you will need to limit fluid intake.  3. Commitment to sufficient and a  good quality of physical and mental rest daily, generally between 6 to 8 hours per day.  WITH PERSISTANCE AND PERSEVERANCE, THE IMPOSSIBLE , BECOMES THE NORM!  Thanks for choosing [facility], we consider it a privelige to serve you.

## 2016-05-19 ENCOUNTER — Encounter: Payer: Self-pay | Admitting: Family Medicine

## 2016-05-19 NOTE — Assessment & Plan Note (Signed)
Deteriorated. Patient re-educated about  the importance of commitment to a  minimum of 150 minutes of exercise per week.  The importance of healthy food choices with portion control discussed. Encouraged to start a food diary, count calories and to consider  joining a support group. Sample diet sheets offered. Goals set by the patient for the next several months.   Weight /BMI 05/18/2016 03/24/2016 02/04/2016  WEIGHT 164 lb 163 lb 161 lb  HEIGHT 5\' 2"  5\' 2"  5\' 5"   BMI 30 kg/m2 29.81 kg/m2 26.79 kg/m2

## 2016-05-19 NOTE — Assessment & Plan Note (Signed)
Controlled, no change in medication  

## 2016-05-19 NOTE — Assessment & Plan Note (Signed)
Hyperlipidemia:Low fat diet discussed and encouraged.   Lipid Panel  Lab Results  Component Value Date   CHOL 181 05/15/2016   HDL 45 (L) 05/15/2016   LDLCALC 120 (H) 05/15/2016   TRIG 79 05/15/2016   CHOLHDL 4.0 05/15/2016   Needs to increase exercise andlower fat intake, not at goal

## 2016-05-19 NOTE — Progress Notes (Signed)
   Faith Hood     MRN: [id number]      DOB: Feb 02, 1956   HPI Faith Hood is here for follow up and re-evaluation of chronic medical conditions, medication management and review of any available recent lab and radiology data.  Preventive health is updated, specifically  Cancer screening and Immunization.   Questions or concerns regarding consultations or procedures which the PT has had in the interim are  addressed. The PT denies any adverse reactions to current medications since the last visit.  c/o weight gain despite exercise , however drinking excess calories, will change this There are no specific complaints   ROS Denies recent fever or chills. Denies sinus pressure, nasal congestion, ear pain or sore throat. Denies chest congestion, productive cough or wheezing. Denies chest pains, palpitations and leg swelling Denies abdominal pain, nausea, vomiting,diarrhea or constipation.   Denies dysuria, frequency, hesitancy or incontinence. Denies joint pain, swelling and limitation in mobility. Denies headaches, seizures, numbness, or tingling. Denies depression, anxiety or insomnia. Denies skin break down or rash.   PE  BP 114/78   Pulse 66   Resp 16   Ht 5\' 2"  (1.575 m)   Wt 164 lb (74.4 kg)   SpO2 97%   BMI 30.00 kg/m   Patient alert and oriented and in no cardiopulmonary distress.  HEENT: No facial asymmetry, EOMI,   oropharynx pink and moist.  Neck supple no JVD, no mass.Bruit bilaterally  Chest: Clear to auscultation bilaterally.  CVS: S1, S2 no murmurs, no S3.Regular rate.  ABD: Soft non tender.   Ext: No edema  MS: Adequate ROM spine, shoulders, hips and knees.  Skin: Intact, no ulcerations or rash noted.  Psych: Good eye contact, normal affect. Memory intact not anxious or depressed appearing.  CNS: CN 2-12 intact, power,  normal throughout.no focal deficits noted.   Assessment & Plan  Carotid bruit re evaluation of possible right carotid bruit is past due  will schedule  Hypertension Controlled, no change in medication DASH diet and commitment to daily physical activity for a minimum of 30 minutes discussed and encouraged, as a part of hypertension management. The importance of attaining a healthy weight is also discussed.  BP/Weight 05/18/2016 03/24/2016 02/04/2016 05/27/2015 01/09/2015 08/15/2014 1/32/4401  Systolic BP 027 253 664 403 474 259 563  Diastolic BP 78 64 82 68 82 78 82  Wt. (Lbs) 164 163 161 158 157.4 157 158.8  BMI 30 29.81 26.79 26.29 26.19 26.32 26.62       Hypothyroidism Controlled, no change in medication   Overweight Deteriorated. Patient re-educated about  the importance of commitment to a  minimum of 150 minutes of exercise per week.  The importance of healthy food choices with portion control discussed. Encouraged to start a food diary, count calories and to consider  joining a support group. Sample diet sheets offered. Goals set by the patient for the next several months.   Weight /BMI 05/18/2016 03/24/2016 02/04/2016  WEIGHT 164 lb 163 lb 161 lb  HEIGHT 5\' 2"  5\' 2"  5\' 5"   BMI 30 kg/m2 29.81 kg/m2 26.79 kg/m2      Hyperlipidemia Hyperlipidemia:Low fat diet discussed and encouraged.   Lipid Panel  Lab Results  Component Value Date   CHOL 181 05/15/2016   HDL 45 (L) 05/15/2016   LDLCALC 120 (H) 05/15/2016   TRIG 79 05/15/2016   CHOLHDL 4.0 05/15/2016   Needs to increase exercise andlower fat intake, not at goal

## 2016-05-19 NOTE — Assessment & Plan Note (Signed)
Controlled, no change in medication DASH diet and commitment to daily physical activity for a minimum of 30 minutes discussed and encouraged, as a part of hypertension management. The importance of attaining a healthy weight is also discussed.  BP/Weight 05/18/2016 03/24/2016 02/04/2016 05/27/2015 01/09/2015 08/15/2014 10/11/5907  Systolic BP 311 216 244 695 072 257 505  Diastolic BP 78 64 82 68 82 78 82  Wt. (Lbs) 164 163 161 158 157.4 157 158.8  BMI 30 29.81 26.79 26.29 26.19 26.32 26.62

## 2016-08-12 ENCOUNTER — Telehealth: Payer: Self-pay | Admitting: *Deleted

## 2016-08-12 NOTE — Telephone Encounter (Signed)
Carotid ultrasound was ordered but never scheduled. Will follow up on this

## 2016-08-12 NOTE — Telephone Encounter (Signed)
Patient called stating when she seen Dr Moshe Cipro a couple months ago Dr Moshe Cipro wanted her to have a mri done. Patient states she has not heard anything else about it. Please advise [phone]

## 2016-08-13 NOTE — Telephone Encounter (Signed)
Patient scheduled Monday June 25 1:30. Pt aware

## 2016-08-17 ENCOUNTER — Ambulatory Visit (HOSPITAL_COMMUNITY)
Admission: RE | Admit: 2016-08-17 | Discharge: 2016-08-17 | Disposition: A | Discharge status: Home / Self Care | Payer: BC Managed Care – PPO | Source: Ambulatory Visit | Attending: Family Medicine | Admitting: Family Medicine

## 2016-08-17 DIAGNOSIS — I6523 Occlusion and stenosis of bilateral carotid arteries: Secondary | ICD-10-CM | POA: Insufficient documentation

## 2016-08-17 DIAGNOSIS — R0989 Other specified symptoms and signs involving the circulatory and respiratory systems: Secondary | ICD-10-CM | POA: Diagnosis not present

## 2016-09-02 ENCOUNTER — Other Ambulatory Visit: Payer: Self-pay | Admitting: Family Medicine

## 2016-09-02 DIAGNOSIS — E038 Other specified hypothyroidism: Secondary | ICD-10-CM

## 2016-09-13 ENCOUNTER — Other Ambulatory Visit: Payer: Self-pay | Admitting: Family Medicine

## 2016-09-30 ENCOUNTER — Other Ambulatory Visit: Payer: Self-pay | Admitting: Family Medicine

## 2016-11-20 ENCOUNTER — Telehealth: Payer: Self-pay | Admitting: Family Medicine

## 2016-11-20 DIAGNOSIS — E038 Other specified hypothyroidism: Secondary | ICD-10-CM

## 2016-11-20 DIAGNOSIS — E785 Hyperlipidemia, unspecified: Secondary | ICD-10-CM

## 2016-11-20 LAB — BASIC METABOLIC PANEL
BUN: 11 mg/dL (ref 7–25)
CHLORIDE: 107 mmol/L (ref 98–110)
CO2: 27 mmol/L (ref 20–32)
CREATININE: 0.82 mg/dL (ref 0.50–1.05)
Calcium: 9.1 mg/dL (ref 8.6–10.4)
Glucose, Bld: 90 mg/dL (ref 65–99)
POTASSIUM: 3.6 mmol/L (ref 3.5–5.3)
SODIUM: 143 mmol/L (ref 135–146)

## 2016-11-20 LAB — TSH: TSH: 2.76 m[IU]/L (ref 0.40–4.50)

## 2016-11-20 NOTE — Telephone Encounter (Signed)
Lab orders expired. Reordered

## 2016-11-23 ENCOUNTER — Ambulatory Visit (INDEPENDENT_AMBULATORY_CARE_PROVIDER_SITE_OTHER): Payer: BC Managed Care – PPO | Admitting: Family Medicine

## 2016-11-23 VITALS — BP 138/64 | HR 52 | Temp 99.0°F | Resp 16 | Ht 62.0 in | Wt 167.8 lb

## 2016-11-23 DIAGNOSIS — E663 Overweight: Secondary | ICD-10-CM

## 2016-11-23 DIAGNOSIS — E785 Hyperlipidemia, unspecified: Secondary | ICD-10-CM

## 2016-11-23 DIAGNOSIS — E038 Other specified hypothyroidism: Secondary | ICD-10-CM

## 2016-11-23 DIAGNOSIS — F320 Major depressive disorder, single episode, mild: Secondary | ICD-10-CM

## 2016-11-23 DIAGNOSIS — Z23 Encounter for immunization: Secondary | ICD-10-CM

## 2016-11-23 DIAGNOSIS — I10 Essential (primary) hypertension: Secondary | ICD-10-CM

## 2016-11-23 MED ORDER — FLUOXETINE HCL 20 MG PO TABS: 20.0000 mg | ORAL_TABLET | Freq: Every day | ORAL | 3 refills | Status: DC

## 2016-11-23 NOTE — Patient Instructions (Addendum)
Physical exam first or 2nd week in December , call if you need me before  CBC, fasting lipid, chem 7 1 week before next appt No  Change in thyroid medication or blood pressure medication  It is important that you exercise regularly at least 30 minutes 5 times a week. If you develop chest pain, have severe difficulty breathing, or feel very tired, stop exercising immediately and seek medical attention   Change eating habits, water only, increase plant based diet, cut out sugar except as fresh fruit  New  For depression is fluoxetine 20 mg one daily   Major Depressive Disorder, Adult Major depressive disorder (MDD) is a mental health condition. MDD often makes you feel sad, hopeless, or helpless. MDD can also cause symptoms in your body. MDD can affect your:  Work.  School.  Relationships.  Other normal activities.  MDD can range from mild to very bad. It may occur once (single episode MDD). It can also occur many times (recurrent MDD). The main symptoms of MDD often include:  Feeling sad, depressed, or irritable most of the time.  Loss of interest.  MDD symptoms also include:  Sleeping too much or too little.  Eating too much or too little.  A change in your weight.  Feeling tired (fatigue) or having low energy.  Feeling worthless.  Feeling guilty.  Trouble making decisions.  Trouble thinking clearly.  Thoughts of suicide or harming others.  Feeling weak.  Feeling agitated.  Keeping yourself from being around other people (isolation).  Follow these instructions at home: Activity  Do these things as told by your doctor: ? Go back to your normal activities. ? Exercise regularly. ? Spend time outdoors. Alcohol  Talk with your doctor about how alcohol can affect your antidepressant medicines.  Do not drink alcohol. Or, limit how much alcohol you drink. ? This means no more than 1 drink a day for nonpregnant women and 2 drinks a day for men. One drink  equals one of these:  12 oz of beer.  5 oz of wine.  1 oz of hard liquor. General instructions  Take over-the-counter and prescription medicines only as told by your doctor.  Eat a healthy diet.  Get plenty of sleep.  Find activities that you enjoy. Make time to do them.  Think about joining a support group. Your doctor may be able to suggest a group for you.  Keep all follow-up visits as told by your doctor. This is important. Where to find more information:  [employer] on Mental Illness: ? www.nami.[place]: ? https://carter.com/  National Suicide Prevention Lifeline: ? (903)699-8891. This is free, 24-hour help. Contact a doctor if:  Your symptoms get worse.  You have new symptoms. Get help right away if:  You self-harm.  You see, hear, taste, smell, or feel things that are not present (hallucinate). If you ever feel like you may hurt yourself or others, or have thoughts about taking your own life, get help right away. You can go to your nearest emergency department or call:  Your local emergency services (911 in the [place]  A suicide crisis helpline, such as the National Suicide Prevention Lifeline: ? (385) 143-1275. This is open 24 hours a day.  This information is not intended to replace advice given to you by your health care provider. Make sure you discuss any questions you have with your health care provider. Document Released: 01/21/2015 Document Revised: 10/27/2015 Document Reviewed: 10/27/2015 Elsevier Interactive  Patient Education  2017 Elsevier Inc.  

## 2016-11-24 ENCOUNTER — Encounter: Payer: Self-pay | Admitting: Family Medicine

## 2016-11-24 NOTE — Progress Notes (Signed)
BREELLE HOLLYWOOD     MRN: [id number]      DOB: [birth year]   HPI Ms. Harren is here for follow up and re-evaluation of chronic medical conditions, medication management and review of any available recent lab and radiology data.  Preventive health is updated, specifically  Cancer screening and Immunization.   Questions or concerns regarding consultations or procedures which the PT has had in the interim are  addressed. The PT denies any adverse reactions to current medications since the last visit.  C/o chronic fatigue x 1 year , worsening, denies snoring, daily headache or excess daytime sleepiness. No overt depression, however sleeps for over 10 hrs, overeats at times for energy, then under eats No specific trigger or new life stress to explain symptoms ROS Denies recent fever or chills. Denies sinus pressure, nasal congestion, ear pain or sore throat. Denies chest congestion, productive cough or wheezing. Denies chest pains, palpitations and leg swelling Denies abdominal pain, nausea, vomiting,diarrhea or constipation.   Denies dysuria, frequency, hesitancy or incontinence. Denies joint pain, swelling and limitation in mobility. Denies headaches, seizures, numbness, or tingling.  Denies skin break down or rash.   PE  BP 138/64 (BP Location: Left Arm, Patient Position: Sitting, Cuff Size: Normal)   Pulse (!) 52   Temp 99 F (37.2 C) (Other (Comment))   Resp 16   Ht 5\' 2"  (1.575 m)   Wt 167 lb 12 oz (76.1 kg)   SpO2 98%   BMI 30.68 kg/m   Patient alert and oriented and in no cardiopulmonary distress.  HEENT: No facial asymmetry, EOMI,   oropharynx pink and moist.  Neck supple no JVD, no mass.  Chest: Clear to auscultation bilaterally.  CVS: S1, S2 no murmurs, no S3.Regular rate.  ABD: Soft non tender.   Ext: No edema  MS: Adequate ROM spine, shoulders, hips and knees.  Skin: Intact, no ulcerations or rash noted.  Psych: Good eye contact, flat affect. Memory intact not  anxious  mildly depressed appearing.  CNS: CN 2-12 intact, power,  normal throughout.no focal deficits noted.   Assessment & Plan  Hypertension Controlled, no change in medication DASH diet and commitment to daily physical activity for a minimum of 30 minutes discussed and encouraged, as a part of hypertension management. The importance of attaining a healthy weight is also discussed.  BP/Weight 11/23/2016 05/18/2016 03/24/2016 02/04/2016 05/27/2015 01/09/2015 6/76/7209  Systolic BP 470 962 836 629 476 546 503  Diastolic BP 64 78 64 82 68 82 78  Wt. (Lbs) 167.75 164 163 161 158 157.4 157  BMI 30.68 30 29.81 26.79 26.29 26.19 26.32       Hyperlipidemia Hyperlipidemia:Low fat diet discussed and encouraged.   Lipid Panel  Lab Results  Component Value Date   CHOL 181 05/15/2016   HDL 45 (L) 05/15/2016   LDLCALC 120 (H) 05/15/2016   TRIG 79 05/15/2016   CHOLHDL 4.0 05/15/2016     Needs to commit to daily exercise Updated lab needed at/ before next visit.;l   Hypothyroidism Controlled, no change in medication   Overweight Deteriorated. Patient re-educated about  the importance of commitment to a  minimum of 150 minutes of exercise per week.  The importance of healthy food choices with portion control discussed. Encouraged to start a food diary, count calories and to consider  joining a support group. Sample diet sheets offered. Goals set by the patient for the next several months.   Weight /BMI 11/23/2016 05/18/2016 03/24/2016  WEIGHT  167 lb 12 oz 164 lb 163 lb  HEIGHT 5\' 2"  5\' 2"  5\' 2"   BMI 30.68 kg/m2 30 kg/m2 29.81 kg/m2      Depression, major, single episode, mild ([facility]) Not suicidal or homicidal, no new personal stress, therapy does not seem indicated but will bereft from treatment . Start fluoxetine 20 mg  daily, review in 8 weeks

## 2016-11-24 NOTE — Assessment & Plan Note (Signed)
Not suicidal or homicidal, no new personal stress, therapy does not seem indicated but will bereft from treatment . Start fluoxetine 20 mg  daily, review in 8 weeks

## 2016-11-24 NOTE — Assessment & Plan Note (Signed)
Controlled, no change in medication  

## 2016-11-24 NOTE — Assessment & Plan Note (Signed)
Deteriorated. Patient re-educated about  the importance of commitment to a  minimum of 150 minutes of exercise per week.  The importance of healthy food choices with portion control discussed. Encouraged to start a food diary, count calories and to consider  joining a support group. Sample diet sheets offered. Goals set by the patient for the next several months.   Weight /BMI 11/23/2016 05/18/2016 03/24/2016  WEIGHT 167 lb 12 oz 164 lb 163 lb  HEIGHT 5\' 2"  5\' 2"  5\' 2"   BMI 30.68 kg/m2 30 kg/m2 29.81 kg/m2

## 2016-11-24 NOTE — Assessment & Plan Note (Signed)
Hyperlipidemia:Low fat diet discussed and encouraged.   Lipid Panel  Lab Results  Component Value Date   CHOL 181 05/15/2016   HDL 45 (L) 05/15/2016   LDLCALC 120 (H) 05/15/2016   TRIG 79 05/15/2016   CHOLHDL 4.0 05/15/2016     Needs to commit to daily exercise Updated lab needed at/ before next visit.;l

## 2016-11-24 NOTE — Assessment & Plan Note (Signed)
Controlled, no change in medication DASH diet and commitment to daily physical activity for a minimum of 30 minutes discussed and encouraged, as a part of hypertension management. The importance of attaining a healthy weight is also discussed.  BP/Weight 11/23/2016 05/18/2016 03/24/2016 02/04/2016 05/27/2015 01/09/2015 2/58/5277  Systolic BP 824 235 361 443 154 008 676  Diastolic BP 64 78 64 82 68 82 78  Wt. (Lbs) 167.75 164 163 161 158 157.4 157  BMI 30.68 30 29.81 26.79 26.29 26.19 26.32

## 2017-01-05 ENCOUNTER — Other Ambulatory Visit: Payer: Self-pay | Admitting: Family Medicine

## 2017-01-05 DIAGNOSIS — Z1231 Encounter for screening mammogram for malignant neoplasm of breast: Secondary | ICD-10-CM

## 2017-01-18 LAB — COMPLETE METABOLIC PANEL WITH GFR
AG RATIO: 1.4 (calc) (ref 1.0–2.5)
ALT: 37 U/L — AB (ref 6–29)
AST: 41 U/L — AB (ref 10–35)
Albumin: 4.4 g/dL (ref 3.6–5.1)
Alkaline phosphatase (APISO): 79 U/L (ref 33–130)
BUN: 15 mg/dL (ref 7–25)
CALCIUM: 9.7 mg/dL (ref 8.6–10.4)
CHLORIDE: 104 mmol/L (ref 98–110)
CO2: 27 mmol/L (ref 20–32)
Creat: 0.87 mg/dL (ref 0.50–1.05)
GFR, EST NON AFRICAN AMERICAN: 73 mL/min/{1.73_m2} (ref >=60)
GFR, Est African American: 85 mL/min/{1.73_m2} (ref >=60)
GLUCOSE: 88 mg/dL (ref 65–99)
Globulin: 3.2 g/dL (calc) (ref 1.9–3.7)
POTASSIUM: 3.4 mmol/L — AB (ref 3.5–5.3)
Sodium: 141 mmol/L (ref 135–146)
Total Bilirubin: 0.6 mg/dL (ref 0.2–1.2)
Total Protein: 7.6 g/dL (ref 6.1–8.1)

## 2017-01-18 LAB — CBC
HEMATOCRIT: 39 % (ref 35.0–45.0)
HEMOGLOBIN: 12.8 g/dL (ref 11.7–15.5)
MCH: 26.9 pg — AB (ref 27.0–33.0)
MCHC: 32.8 g/dL (ref 32.0–36.0)
MCV: 81.9 fL (ref 80.0–100.0)
MPV: 11 fL (ref 7.5–12.5)
Platelets: 242 10*3/uL (ref 140–400)
RBC: 4.76 10*6/uL (ref 3.80–5.10)
RDW: 13.7 % (ref 11.0–15.0)
WBC: 5 10*3/uL (ref 3.8–10.8)

## 2017-01-18 LAB — LIPID PANEL
CHOL/HDL RATIO: 4.8 (calc) (ref <5.0)
Cholesterol: 178 mg/dL (ref <200)
HDL: 37 mg/dL — AB (ref >50)
LDL Cholesterol (Calc): 123 mg/dL (calc) — ABNORMAL HIGH
NON-HDL CHOLESTEROL (CALC): 141 mg/dL — AB (ref <130)
Triglycerides: 85 mg/dL (ref <150)

## 2017-01-20 ENCOUNTER — Encounter: Payer: BC Managed Care – PPO | Admitting: Family Medicine

## 2017-01-25 ENCOUNTER — Telehealth (HOSPITAL_COMMUNITY): Payer: Self-pay

## 2017-01-25 NOTE — Telephone Encounter (Signed)
[facility] Follow upp call   Writer left a voice mail message.

## 2017-02-17 ENCOUNTER — Ambulatory Visit (HOSPITAL_COMMUNITY)
Admission: RE | Admit: 2017-02-17 | Discharge: 2017-02-17 | Disposition: A | Discharge status: Home / Self Care | Payer: BC Managed Care – PPO | Source: Ambulatory Visit | Attending: Family Medicine | Admitting: Family Medicine

## 2017-02-17 DIAGNOSIS — Z1231 Encounter for screening mammogram for malignant neoplasm of breast: Secondary | ICD-10-CM | POA: Diagnosis present

## 2017-02-20 ENCOUNTER — Encounter (HOSPITAL_COMMUNITY): Payer: Self-pay | Admitting: Emergency Medicine

## 2017-02-20 ENCOUNTER — Emergency Department (HOSPITAL_COMMUNITY)
Admission: EM | Admit: 2017-02-20 | Discharge: 2017-02-20 | Disposition: A | Discharge status: Home / Self Care | Payer: BC Managed Care – PPO | Attending: Emergency Medicine | Admitting: Emergency Medicine

## 2017-02-20 ENCOUNTER — Other Ambulatory Visit: Payer: Self-pay

## 2017-02-20 DIAGNOSIS — E785 Hyperlipidemia, unspecified: Secondary | ICD-10-CM | POA: Insufficient documentation

## 2017-02-20 DIAGNOSIS — J042 Acute laryngotracheitis: Secondary | ICD-10-CM | POA: Insufficient documentation

## 2017-02-20 DIAGNOSIS — R0981 Nasal congestion: Secondary | ICD-10-CM | POA: Diagnosis not present

## 2017-02-20 DIAGNOSIS — E039 Hypothyroidism, unspecified: Secondary | ICD-10-CM | POA: Insufficient documentation

## 2017-02-20 DIAGNOSIS — I10 Essential (primary) hypertension: Secondary | ICD-10-CM | POA: Diagnosis not present

## 2017-02-20 DIAGNOSIS — R05 Cough: Secondary | ICD-10-CM | POA: Diagnosis present

## 2017-02-20 DIAGNOSIS — J029 Acute pharyngitis, unspecified: Secondary | ICD-10-CM | POA: Insufficient documentation

## 2017-02-20 DIAGNOSIS — Z79899 Other long term (current) drug therapy: Secondary | ICD-10-CM | POA: Insufficient documentation

## 2017-02-20 MED ORDER — GUAIFENESIN-CODEINE 100-10 MG/5ML PO SOLN: 10.0000 mL | Freq: Four times a day (QID) | ORAL | 0 refills | Status: DC | PRN

## 2017-02-20 NOTE — ED Triage Notes (Signed)
Patient complains of cough and congestion x 2 weeks. States nasal congestion and drainage. Denies fever/chills.

## 2017-02-20 NOTE — Discharge Instructions (Signed)
Make sure you are drinking plenty of fluids as discussed.  You may also try teaspoon of honey which can help minimize coughing.

## 2017-02-20 NOTE — ED Provider Notes (Signed)
[facility] EMERGENCY DEPARTMENT Provider Note   CSN: [id number] Arrival date & time: 02/20/17  1444     History   Chief Complaint Chief Complaint  Patient presents with  . Cough  . Nasal Congestion    HPI Faith Hood is a 61 y.o. female presenting with a  2 week history of uri type symptoms which started as nasal congestion with purulent and blood-tinged rhinorrhea but now clear, sore throat which is now resolved, but endorses a persistent cough which has been nonproductive.  She describes a persistent sensation of congestion that needs to be coughed up, but the cough has been nonproductive.  Symptoms do not include shortness of breath, wheezing, dizziness, neck pain, headaches, chest pain, nausea, vomiting or diarrhea.  The patient has taken Alka-Seltzer cold formula prior to arrival with no significant improvement in symptoms. .  The history is provided by the patient.    Past Medical History:  Diagnosis Date  . Cerebral vascular disease    Carotid ultrasound in 10/2011: Mild plaque; tortuous vessels; no definite luminal obstruction.  . Hepatic steatosis    By CT scan  . Hyperlipidemia   . Hypertension   . Overweight(278.02)   . Uterine leiomyoma    By CT scan    Patient Active Problem List   Diagnosis Date Noted  . Depression, major, single episode, mild ([place]) 11/23/2016  . Hypothyroidism 04/04/2014  . Back pain with left-sided radiculopathy 05/16/2013  . Carotid bruit 11/29/2011  . Abnormal EKG 11/10/2011  . Overweight 05/13/2008  . Hyperlipidemia 05/09/2008  . FIBROIDS, UTERUS 11/30/2007  . Hypertension 11/30/2007    Past Surgical History:  Procedure Laterality Date  . COLONOSCOPY  12/2007   Negative screening study  . DIAGNOSTIC LAPAROSCOPY  1978   Gynecologic problems    OB History    No data available       Home Medications    Prior to Admission medications   Medication Sig Start Date End Date Taking? Authorizing Provider  amLODipine  (NORVASC) 10 MG tablet TAKE 1 TABLET BY MOUTH ONCE DAILY 09/14/16   Fayrene Helper, MD  aspirin EC 81 MG tablet Take 81 mg by mouth daily.    [provider]  Calcium Carbonate-Vitamin D (CALCIUM 600/VITAMIN D) 600-400 MG-UNIT per chew tablet Chew 1 tablet by mouth daily.      [provider]  FLUoxetine (PROZAC) 20 MG tablet Take 1 tablet (20 mg total) by mouth daily. 11/23/16   Fayrene Helper, MD  guaiFENesin-codeine 100-10 MG/5ML syrup Take 10 mLs by mouth every 6 (six) hours as needed for cough. 02/20/17   Evalee Jefferson, PA-C  levothyroxine (SYNTHROID, LEVOTHROID) 25 MCG tablet TAKE ONE TABLET BY MOUTH ON MONDAY, WEDNESDAY, FRIDAY AND SUNDAY. TAKE ONE AND ONE-HALF TABLET ON TUESDAY, THURSDAY AND SATURDAY. 11/11/15   Fayrene Helper, MD  levothyroxine (SYNTHROID, LEVOTHROID) 25 MCG tablet TAKE ONE TABLET BY MOUTH ON MONDAY, WEDNESDAY , FRIDAY AND SUNDAY. TAKE ONE AND ONE-HALF TABLET ON TUESDAY, THURSDAY AND SATURDAY 09/02/16   Fayrene Helper, MD  losartan-hydrochlorothiazide [facility] [employer] [facility]) 100-25 MG tablet TAKE ONE TABLET BY MOUTH ONCE DAILY 09/30/16   Fayrene Helper, MD  Multiple Vitamin (MULTIVITAMIN) tablet Take 1 tablet by mouth daily.      [provider]  potassium chloride SA (K-DUR,KLOR-CON) 20 MEQ tablet Take 1 tablet (20 mEq total) by mouth daily. 05/18/16   Fayrene Helper, MD    Family History Family History  Problem Relation Age of  Onset  . Pancreatic cancer Mother 11       Diagnosed in 2009; currently in hospice pt. htn  . Hypertension Mother        + Sister x4  . Cancer Father 14       brain tumor  . Cancer Other        MGM side cervical cancer /MGM side  stomach cancer   . Colon cancer Neg Hx     Social History Social History   Tobacco Use  . Smoking status: Never Smoker  . Smokeless tobacco: Never Used  Substance Use Topics  . Alcohol use: No    Comment: Never Drink  . Drug use: No     Allergies   Patient has no known  allergies.   Review of Systems Review of Systems  Constitutional: Negative for chills and fever.  HENT: Positive for congestion, rhinorrhea and sore throat. Negative for ear pain, sinus pressure, trouble swallowing and voice change.   Eyes: Negative for discharge.  Respiratory: Positive for cough. Negative for shortness of breath, wheezing and stridor.   Cardiovascular: Negative for chest pain.  Gastrointestinal: Negative for abdominal pain.  Genitourinary: Negative.      Physical Exam Updated Vital Signs BP 126/76 (BP Location: Right Arm)   Pulse 91   Temp 98.9 F (37.2 C) (Oral)   Resp 18   Ht 5' (1.524 m)   Wt 77.1 kg (170 lb)   SpO2 99%   BMI 33.20 kg/m   Physical Exam  Constitutional: She appears well-developed and well-nourished.  HENT:  Head: Normocephalic and atraumatic.  Eyes: Conjunctivae are normal.  Neck: Normal range of motion.  Cardiovascular: Normal rate, regular rhythm, normal heart sounds and intact distal pulses.  Pulmonary/Chest: Effort normal and breath sounds normal. No stridor. No respiratory distress. She has no wheezes. She has no rales. She exhibits no tenderness.  Abdominal: Soft. Bowel sounds are normal. There is no tenderness.  Musculoskeletal: Normal range of motion.  Neurological: She is alert.  Skin: Skin is warm and dry.  Psychiatric: She has a normal mood and affect.  Nursing note and vitals reviewed.    ED Treatments / Results  Labs (all labs ordered are listed, but only abnormal results are displayed) Labs Reviewed - No data to display  EKG  EKG Interpretation None       Radiology No results found.  Procedures Procedures (including critical care time)  Medications Ordered in ED Medications - No data to display   Initial Impression / Assessment and Plan / ED Course  I have reviewed the triage vital signs and the nursing notes.  Pertinent labs & imaging results that were available during my care of the patient were  reviewed by me and considered in my medical decision making (see chart for details).     Exam normal today including normal lung exam, clear lungs bilaterally without wheezing or rhonchi.  Suspect persistent cough secondary to viral URI.  Reassurance given.  Discussed increase fluid intake, OTC remedies for cough reduction, she was prescribed Robitussin-AC as needed.  Advised PRN follow-up for persistent or worsening symptoms.  Final Clinical Impressions(s) / ED Diagnoses   Final diagnoses:  Acute laryngotracheitis    ED Discharge Orders        Ordered    guaiFENesin-codeine 100-10 MG/5ML syrup  Every 6 hours PRN     02/20/17 1731       Landis Martins 02/20/17 Emelia Salisbury, MD 02/20/17  2358  

## 2017-03-11 ENCOUNTER — Encounter: Payer: Self-pay | Admitting: Family Medicine

## 2017-03-11 ENCOUNTER — Ambulatory Visit (INDEPENDENT_AMBULATORY_CARE_PROVIDER_SITE_OTHER): Payer: BC Managed Care – PPO | Admitting: Family Medicine

## 2017-03-11 VITALS — BP 128/82 | HR 59 | Resp 16 | Ht 62.0 in | Wt 164.0 lb

## 2017-03-11 DIAGNOSIS — Z1211 Encounter for screening for malignant neoplasm of colon: Secondary | ICD-10-CM | POA: Diagnosis not present

## 2017-03-11 DIAGNOSIS — E785 Hyperlipidemia, unspecified: Secondary | ICD-10-CM

## 2017-03-11 DIAGNOSIS — Z0000 Encounter for general adult medical examination without abnormal findings: Secondary | ICD-10-CM | POA: Diagnosis not present

## 2017-03-11 DIAGNOSIS — I10 Essential (primary) hypertension: Secondary | ICD-10-CM | POA: Diagnosis not present

## 2017-03-11 LAB — POC HEMOCCULT BLD/STL (OFFICE/1-CARD/DIAGNOSTIC): Fecal Occult Blood, POC: NEGATIVE

## 2017-03-11 NOTE — Patient Instructions (Addendum)
F/u end March, call if ypou need me sooner.  Please start medication by mid Februaury  Fasting luipid, cmp and eGFrm, TSH 5 days before follow up  It is important that you exercise regularly at least 30 minutes 5 times a week. If you develop chest pain, have severe difficulty breathing, or feel very tired, stop exercising immediately and seek medical attention  Please work on good  health habits so that your health will improve. 1. Commitment to daily physical activity for 30 to 60  minutes, if you are able to do this.  2. Commitment to wise food choices. Aim for half of your  food intake to be vegetable and fruit, one quarter starchy foods, and one quarter protein. Try to eat on a regular schedule  3 meals per day, snacking between meals should be limited to vegetables or fruits or small portions of nuts. 64 ounces of water per day is generally recommended, unless you have specific health conditions, like heart failure or kidney failure where you will need to limit fluid intake.  3. Commitment to sufficient and a  good quality of physical and mental rest daily, generally between 6 to 8 hours per day.  WITH PERSISTANCE AND PERSEVERANCE, THE IMPOSSIBLE , BECOMES THE NORM! Thank you  for choosing [facility]. We consider it a privelige to serve you.  Delivering excellent health care in a caring and  compassionate way is our goal.  Partnering with you,  so that together we can achieve this goal is our strategy.

## 2017-03-12 ENCOUNTER — Encounter: Payer: Self-pay | Admitting: Family Medicine

## 2017-03-12 NOTE — Progress Notes (Signed)
    Faith Hood     MRN: [id number]      DOB: [birth year]  HPI: Patient is in for annual physical exam. Ms. Thier has  Held off on starting the fluoxetine prescribed at her last visit as she recently started a new job, but she intends to start in the next 3 weeks and will f/u within 4 weeks Immunization is reviewed , and  Is up to date   PE: BP 128/82   Pulse (!) 59   Resp 16   Ht 5\' 2"  (1.575 m)   Wt 164 lb (74.4 kg)   SpO2 99%   BMI 30.00 kg/m  Pleasant  female, alert and oriented x 3, in no cardio-pulmonary distress. Afebrile. HEENT No facial trauma or asymetry. Sinuses non tender.  Extra occullar muscles intact External ears normal, tympanic membranes clear. Oropharynx moist, no exudate. Neck: supple, no adenopathy,JVD or thyromegaly.No bruits.  Chest: Clear to ascultation bilaterally.No crackles or wheezes. Non tender to palpation  Breast: No asymetry,no masses or lumps. No tenderness. No nipple discharge or inversion. No axillary or supraclavicular adenopathy  Cardiovascular system; Heart sounds normal,  S1 and  S2 ,no S3.  No murmur, or thrill. Apical beat not displaced Peripheral pulses normal.  Abdomen: Soft, non tender, no organomegaly or masses. No bruits. Bowel sounds normal. No guarding, tenderness or rebound.  Rectal:  Normal sphincter tone. No rectal mass. Guaiac negative stool.  GU: Not examined, pt is asymptomatic Musculoskeletal exam: Full ROM of spine, hips , shoulders and knees. No deformity ,swelling or crepitus noted. No muscle wasting or atrophy.   Neurologic: Cranial nerves 2 to 12 intact. Power, tone ,sensation and reflexes normal throughout. No disturbance in gait. No tremor.  Skin: Intact, no ulceration, erythema , scaling or rash noted. Pigmentation normal throughout  Psych; Normal mood and affect. Judgement and concentration normal   Assessment & Plan:  Annual physical exam Annual exam as documented.  Immunization  and cancer screening needs are specifically addressed at this visit.

## 2017-03-12 NOTE — Assessment & Plan Note (Signed)
Annual exam as documented. . Immunization and cancer screening needs are specifically addressed at this visit.  

## 2017-05-17 ENCOUNTER — Ambulatory Visit: Payer: BC Managed Care – PPO | Admitting: Family Medicine

## 2017-06-30 ENCOUNTER — Other Ambulatory Visit: Payer: Self-pay | Admitting: Family Medicine

## 2017-11-23 ENCOUNTER — Telehealth: Payer: Self-pay | Admitting: Family Medicine

## 2017-11-23 DIAGNOSIS — I10 Essential (primary) hypertension: Secondary | ICD-10-CM

## 2017-11-23 DIAGNOSIS — E785 Hyperlipidemia, unspecified: Secondary | ICD-10-CM

## 2017-11-23 NOTE — Telephone Encounter (Signed)
Her order had expired but I ordered fasting labs she can do today or tomorrow am

## 2017-11-23 NOTE — Telephone Encounter (Signed)
Patient did not get her labs done she is wondering if she needs these before her appt tomorrow.

## 2017-11-23 NOTE — Telephone Encounter (Signed)
LEFT DETAILED VOICEMAIL 

## 2017-11-24 ENCOUNTER — Ambulatory Visit: Payer: BC Managed Care – PPO | Admitting: Family Medicine

## 2017-11-24 ENCOUNTER — Telehealth: Payer: Self-pay | Admitting: Family Medicine

## 2017-11-24 NOTE — Telephone Encounter (Signed)
Pt LVM to call, I called and you cant leave a msg (pt may have been returning my call to conf appt)

## 2017-12-06 ENCOUNTER — Ambulatory Visit (INDEPENDENT_AMBULATORY_CARE_PROVIDER_SITE_OTHER): Payer: BC Managed Care – PPO

## 2017-12-06 DIAGNOSIS — Z23 Encounter for immunization: Secondary | ICD-10-CM

## 2017-12-06 NOTE — Progress Notes (Signed)
Received flu vaccine with no complication

## 2017-12-21 ENCOUNTER — Encounter: Payer: Self-pay | Admitting: Internal Medicine

## 2018-01-03 ENCOUNTER — Ambulatory Visit: Payer: BC Managed Care – PPO | Admitting: Family Medicine

## 2018-01-04 LAB — COMPLETE METABOLIC PANEL WITH GFR
AG RATIO: 1.4 (calc) (ref 1.0–2.5)
ALT: 41 U/L — AB (ref 6–29)
AST: 36 U/L — AB (ref 10–35)
Albumin: 4.2 g/dL (ref 3.6–5.1)
Alkaline phosphatase (APISO): 100 U/L (ref 33–130)
BILIRUBIN TOTAL: 0.6 mg/dL (ref 0.2–1.2)
BUN: 15 mg/dL (ref 7–25)
CHLORIDE: 106 mmol/L (ref 98–110)
CO2: 31 mmol/L (ref 20–32)
Calcium: 9.7 mg/dL (ref 8.6–10.4)
Creat: 0.88 mg/dL (ref 0.50–0.99)
GFR, Est African American: 83 mL/min/{1.73_m2} (ref >=60)
GFR, Est Non African American: 71 mL/min/{1.73_m2} (ref >=60)
Globulin: 3 g/dL (calc) (ref 1.9–3.7)
Glucose, Bld: 84 mg/dL (ref 65–99)
POTASSIUM: 3.8 mmol/L (ref 3.5–5.3)
SODIUM: 143 mmol/L (ref 135–146)
Total Protein: 7.2 g/dL (ref 6.1–8.1)

## 2018-01-04 LAB — LIPID PANEL
CHOL/HDL RATIO: 4 (calc) (ref <5.0)
Cholesterol: 168 mg/dL (ref <200)
HDL: 42 mg/dL — AB (ref >50)
LDL CHOLESTEROL (CALC): 110 mg/dL — AB
NON-HDL CHOLESTEROL (CALC): 126 mg/dL (ref <130)
TRIGLYCERIDES: 73 mg/dL (ref <150)

## 2018-01-04 LAB — TSH: TSH: 2.49 mIU/L (ref 0.40–4.50)

## 2018-01-06 ENCOUNTER — Encounter: Payer: Self-pay | Admitting: Family Medicine

## 2018-01-06 ENCOUNTER — Ambulatory Visit (INDEPENDENT_AMBULATORY_CARE_PROVIDER_SITE_OTHER): Payer: BC Managed Care – PPO | Admitting: Family Medicine

## 2018-01-06 VITALS — BP 120/78 | HR 63 | Resp 12 | Ht 63.0 in | Wt 158.0 lb

## 2018-01-06 DIAGNOSIS — R7401 Elevation of levels of liver transaminase levels: Secondary | ICD-10-CM

## 2018-01-06 DIAGNOSIS — R740 Nonspecific elevation of levels of transaminase and lactic acid dehydrogenase [LDH]: Secondary | ICD-10-CM

## 2018-01-06 DIAGNOSIS — E663 Overweight: Secondary | ICD-10-CM | POA: Diagnosis not present

## 2018-01-06 DIAGNOSIS — E038 Other specified hypothyroidism: Secondary | ICD-10-CM | POA: Diagnosis not present

## 2018-01-06 DIAGNOSIS — I10 Essential (primary) hypertension: Secondary | ICD-10-CM

## 2018-01-06 DIAGNOSIS — E785 Hyperlipidemia, unspecified: Secondary | ICD-10-CM

## 2018-01-06 DIAGNOSIS — Z1211 Encounter for screening for malignant neoplasm of colon: Secondary | ICD-10-CM

## 2018-01-06 DIAGNOSIS — R7301 Impaired fasting glucose: Secondary | ICD-10-CM

## 2018-01-06 DIAGNOSIS — Z1231 Encounter for screening mammogram for malignant neoplasm of breast: Secondary | ICD-10-CM

## 2018-01-06 NOTE — Patient Instructions (Addendum)
Annual physical exam with pap and  in 4 months with Shingrix #2, call if you need me before Please sched de c 27 mammogram at checkout.  You need colonoscopy, call  The office  for your appt Shingrix #1  Today ( not available , ned to be recalled)  CBC, fasting lipid, cmp and eGFr, tSH and HBa1C 1 week before next visit  You need  To reduce fried and fatty  Foods and commit to regular exercise, your bad cholesterol is high, and good cholesterool is low  Please work on weight loss , goal 7 pounds

## 2018-01-07 ENCOUNTER — Encounter: Payer: Self-pay | Admitting: Family Medicine

## 2018-01-07 DIAGNOSIS — R7401 Elevation of levels of liver transaminase levels: Secondary | ICD-10-CM | POA: Insufficient documentation

## 2018-01-07 DIAGNOSIS — R740 Nonspecific elevation of levels of transaminase and lactic acid dehydrogenase [LDH]: Secondary | ICD-10-CM

## 2018-01-07 NOTE — Assessment & Plan Note (Signed)
Improved Patient re-educated about  the importance of commitment to a  minimum of 150 minutes of exercise per week.  The importance of healthy food choices with portion control discussed. Encouraged to start a food diary, count calories and to consider  joining a support group. Sample diet sheets offered. Goals set by the patient for the next several months.   Weight /BMI 01/06/2018 03/11/2017 02/20/2017  WEIGHT 158 lb 164 lb 170 lb  HEIGHT 5\' 3"  5\' 2"  5\' 0"   BMI 27.99 kg/m2 30 kg/m2 33.2 kg/m2

## 2018-01-07 NOTE — Assessment & Plan Note (Signed)
Hyperlipidemia:Low fat diet discussed and encouraged.   Lipid Panel  Lab Results  Component Value Date   CHOL 168 01/04/2018   HDL 42 (L) 01/04/2018   LDLCALC 110 (H) 01/04/2018   TRIG 73 01/04/2018   CHOLHDL 4.0 01/04/2018  needs to reduce fat intake and increase exercise

## 2018-01-07 NOTE — Assessment & Plan Note (Signed)
Controlled, no change in medication  

## 2018-01-07 NOTE — Assessment & Plan Note (Signed)
Weight loss needed to correct, she understands and will work on this

## 2018-01-07 NOTE — Assessment & Plan Note (Signed)
Controlled, no change in medication DASH diet and commitment to daily physical activity for a minimum of 30 minutes discussed and encouraged, as a part of hypertension management. The importance of attaining a healthy weight is also discussed.  BP/Weight 01/06/2018 03/11/2017 02/20/2017 11/23/2016 05/18/2016 03/24/2016 01/75/1025  Systolic BP 852 778 242 353 614 431 540  Diastolic BP 78 82 76 64 78 64 82  Wt. (Lbs) 158 164 170 167.75 164 163 161  BMI 27.99 30 33.2 30.68 30 29.81 26.79

## 2018-01-07 NOTE — Progress Notes (Signed)
   Faith LANTER     MRN: [id number]      DOB: 1955-06-23   HPI [name] is here for follow up and re-evaluation of chronic medical conditions, medication management and review of any available recent lab and radiology data.  Preventive health is updated, specifically  Cancer screening and Immunization.   Questions or concerns regarding consultations or procedures which the PT has had in the interim are  addressed. The PT denies any adverse reactions to current medications since the last visit.  There are no new concerns.  There are no specific complaints   ROS Denies recent fever or chills. Denies sinus pressure, nasal congestion, ear pain or sore throat. Denies chest congestion, productive cough or wheezing. Denies chest pains, palpitations and leg swelling Denies abdominal pain, nausea, vomiting,diarrhea or constipation.   Denies dysuria, frequency, hesitancy or incontinence. Denies joint pain, swelling and limitation in mobility. Denies headaches, seizures, numbness, or tingling. Denies depression, anxiety or insomnia. Denies skin break down or rash.   PE  BP 120/78   Pulse 63   Resp 12   Ht 5\' 3"  (1.6 m)   Wt 158 lb (71.7 kg)   SpO2 98% Comment: room air  BMI 27.99 kg/m   Patient alert and oriented and in no cardiopulmonary distress.  HEENT: No facial asymmetry, EOMI,   oropharynx pink and moist.  Neck supple no JVD, no mass.  Chest: Clear to auscultation bilaterally.  CVS: S1, S2 no murmurs, no S3.Regular rate.  ABD: Soft non tender.   Ext: No edema  MS: Adequate ROM spine, shoulders, hips and knees.  Skin: Intact, no ulcerations or rash noted.  Psych: Good eye contact, normal affect. Memory intact not anxious or depressed appearing.  CNS: CN 2-12 intact, power,  normal throughout.no focal deficits noted.   Assessment & Plan  Hypertension Controlled, no change in medication DASH diet and commitment to daily physical activity for a minimum of 30 minutes  discussed and encouraged, as a part of hypertension management. The importance of attaining a healthy weight is also discussed.  BP/Weight 01/06/2018 03/11/2017 02/20/2017 11/23/2016 05/18/2016 03/24/2016 62/37/6283  Systolic BP 151 761 607 371 062 694 854  Diastolic BP 78 82 76 64 78 64 82  Wt. (Lbs) 158 164 170 167.75 164 163 161  BMI 27.99 30 33.2 30.68 30 29.81 26.79       Hyperlipidemia Hyperlipidemia:Low fat diet discussed and encouraged.   Lipid Panel  Lab Results  Component Value Date   CHOL 168 01/04/2018   HDL 42 (L) 01/04/2018   LDLCALC 110 (H) 01/04/2018   TRIG 73 01/04/2018   CHOLHDL 4.0 01/04/2018  needs to reduce fat intake and increase exercise     Overweight Improved Patient re-educated about  the importance of commitment to a  minimum of 150 minutes of exercise per week.  The importance of healthy food choices with portion control discussed. Encouraged to start a food diary, count calories and to consider  joining a support group. Sample diet sheets offered. Goals set by the patient for the next several months.   Weight /BMI 01/06/2018 03/11/2017 02/20/2017  WEIGHT 158 lb 164 lb 170 lb  HEIGHT 5\' 3"  5\' 2"  5\' 0"   BMI 27.99 kg/m2 30 kg/m2 33.2 kg/m2      Hypothyroidism Controlled, no change in medication   Transaminitis Weight loss needed to correct, she understands and will work on this

## 2018-01-24 ENCOUNTER — Encounter: Payer: Self-pay | Admitting: Internal Medicine

## 2018-02-03 ENCOUNTER — Ambulatory Visit (INDEPENDENT_AMBULATORY_CARE_PROVIDER_SITE_OTHER): Payer: BC Managed Care – PPO

## 2018-02-03 DIAGNOSIS — Z23 Encounter for immunization: Secondary | ICD-10-CM

## 2018-02-03 NOTE — Progress Notes (Signed)
Patient received shingrix in left deltoid without any complications. VIS given and pt to get #2 shingrix at her March appt

## 2018-02-21 ENCOUNTER — Ambulatory Visit (HOSPITAL_COMMUNITY)
Admission: RE | Admit: 2018-02-21 | Discharge: 2018-02-21 | Disposition: A | Discharge status: Home / Self Care | Payer: BC Managed Care – PPO | Source: Ambulatory Visit | Attending: Family Medicine | Admitting: Family Medicine

## 2018-02-21 DIAGNOSIS — Z1231 Encounter for screening mammogram for malignant neoplasm of breast: Secondary | ICD-10-CM

## 2018-02-28 ENCOUNTER — Ambulatory Visit (INDEPENDENT_AMBULATORY_CARE_PROVIDER_SITE_OTHER): Payer: Self-pay

## 2018-02-28 DIAGNOSIS — Z1211 Encounter for screening for malignant neoplasm of colon: Secondary | ICD-10-CM

## 2018-02-28 MED ORDER — NA SULFATE-K SULFATE-MG SULF 17.5-3.13-1.6 GM/177ML PO SOLN: 1.0000 | ORAL | 0 refills | Status: DC

## 2018-02-28 NOTE — Progress Notes (Signed)
Gastroenterology Pre-Procedure Review  Request Date:02/28/18 Requesting Physician: Dr.Simpson- previous tcs 01/18/2008 RMR- no polyps  PATIENT REVIEW QUESTIONS: The patient responded to the following health history questions as indicated:    1. Diabetes Melitis: no 2. Joint replacements in the past 12 months: no 3. Major health problems in the past 3 months: no 4. Has an artificial valve or MVP: no 5. Has a defibrillator: no 6. Has been advised in past to take antibiotics in advance of a procedure like teeth cleaning: no 7. Family history of colon cancer: no  8. Alcohol Use: no 9. History of sleep apnea: no  10. History of coronary artery or other vascular stents placed within the last 12 months: no 11. History of any prior anesthesia complications: no    MEDICATIONS & ALLERGIES:    Patient reports the following regarding taking any blood thinners:   Plavix? no Aspirin? yes (81mg ) Coumadin? no Brilinta? no Xarelto? no Eliquis? no Pradaxa? no Savaysa? no Effient? no  Patient confirms/reports the following medications:  Current Outpatient Medications  Medication Sig Dispense Refill  . amLODipine (NORVASC) 10 MG tablet TAKE 1 TABLET BY MOUTH ONCE DAILY 30 tablet 3  . aspirin EC 81 MG tablet Take 81 mg by mouth daily.    . Calcium Carbonate-Vitamin D (CALCIUM 600/VITAMIN D) 600-400 MG-UNIT per chew tablet Chew 1 tablet by mouth daily.      Marland Kitchen losartan-hydrochlorothiazide (HYZAAR) 100-25 MG tablet TAKE ONE TABLET BY MOUTH ONCE DAILY 90 tablet 1  . Multiple Vitamin (MULTIVITAMIN) tablet Take 1 tablet by mouth daily.       No current facility-administered medications for this visit.     Patient confirms/reports the following allergies:  No Known Allergies  No orders of the defined types were placed in this encounter.   AUTHORIZATION INFORMATION Primary Insurance: [place] ,  [place] #: [id number] Pre-Cert / Josem Kaufmann required: no  SCHEDULE INFORMATION: Procedure has been  scheduled as follows:  Date: 04/27/18, Time: 12:00 Location: [facility] Dr.Rourk  This Gastroenterology Pre-Precedure Review Form is being routed to the following provider(s): Neil Crouch PA

## 2018-02-28 NOTE — Patient Instructions (Addendum)
Faith Hood  30-Oct-1955 MRN: [id number]     Procedure Date: 08/17/2018 Time to register: 12:00pm Place to register: [facility] Procedure Time: 1:00pm Scheduled provider: R. Garfield Cornea, MD    PREPARATION FOR COLONOSCOPY WITH SUPREP BOWEL PREP KIT  Note: Suprep Bowel Prep Kit is a split-dose (2day) regimen. Consumption of BOTH 6-ounce bottles is required for a complete prep.  Please notify us immediately if you are diabetic, take iron supplements, or if you are on Coumadin or any other blood thinners.                                                                                                                                             2 DAYS BEFORE PROCEDURE:  DATE: 08/15/2018  DAY: Monday Begin clear liquid diet AFTER your lunch meal. NO SOLID FOODS after this point.  1 DAY BEFORE PROCEDURE:  DATE: 08/16/2018   DAY: Tuesday Continue clear liquids the entire day - NO SOLID FOOD.   At 6:00pm: Complete steps 1 through 4 below, using ONE (1) 6-ounce bottle, before going to bed. Step 1:  Pour ONE (1) 6-ounce bottle of SUPREP liquid into the mixing container.  Step 2:  Add cool drinking water to the 16 ounce line on the container and mix.  Note: Dilute the solution concentrate as directed prior to use. Step 3:  DRINK ALL the liquid in the container. Step 4:  You MUST drink an additional two (2) or more 16 ounce containers of water over the next one (1) hour.   Continue clear liquids.  DAY OF PROCEDURE:   DATE: 08/17/2018   DAY: Wednesday If you take medications for your heart, blood pressure, or breathing, you may take these medications.   5 hours before your procedure at :8:00am Step 1:  Pour ONE (1) 6-ounce bottle of SUPREP liquid into the mixing container.  Step 2:  Add cool drinking water to the 16 ounce line on the container and mix.  Note: Dilute the solution concentrate as directed prior to use. Step 3:  DRINK ALL the liquid in the container. Step 4:  You MUST  drink an additional two (2) or more 16 ounce containers of water over the next one (1) hour. You MUST complete the final glass of water at least 3 hours before your colonoscopy.   Nothing by mouth past 10:00am  You may take your morning medications with sip of water unless we have instructed otherwise.    Please see below for Dietary Information.  CLEAR LIQUIDS INCLUDE:  Water Jello (NOT red in color)   Ice Popsicles (NOT red in color)   Tea (sugar ok, no milk/cream) Powdered fruit flavored drinks  Coffee (sugar ok, no milk/cream) Gatorade/ Lemonade/ Kool-Aid  (NOT red in color)   Juice: apple, white grape, white cranberry Soft drinks  Clear bullion, consomme, broth (fat free  beef/chicken/vegetable)  Carbonated beverages (any kind)  Strained chicken noodle soup Hard Candy   Remember: Clear liquids are liquids that will allow you to see your fingers on the other side of a clear glass. Be sure liquids are NOT red in color, and not cloudy, but CLEAR.  DO NOT EAT OR DRINK ANY OF THE FOLLOWING:  Dairy products of any kind   Cranberry juice Tomato juice / V8 juice   Grapefruit juice Orange juice     Red grape juice  Do not eat any solid foods, including such foods as: cereal, oatmeal, yogurt, fruits, vegetables, creamed soups, eggs, bread, crackers, pureed foods in a blender, etc.   HELPFUL HINTS FOR DRINKING PREP SOLUTION:   Make sure prep is extremely cold. Mix and refrigerate the the morning of the prep. You may also put in the freezer.   You may try mixing some Crystal Light or Country Time Lemonade if you prefer. Mix in small amounts; add more if necessary.  Try drinking through a straw  Rinse mouth with water or a mouthwash between glasses, to remove after-taste.  Try sipping on a cold beverage /ice/ popsicles between glasses of prep.  Place a piece of sugar-free hard candy in mouth between glasses.  If you become nauseated, try consuming smaller amounts, or stretch out the  time between glasses. Stop for 30-60 minutes, then slowly start back drinking.     OTHER INSTRUCTIONS  You will need a responsible adult at least 61 years of age to accompany you and drive you home. This person must remain in the waiting room during your procedure. The hospital will cancel your procedure if you do not have a responsible adult with you.   1. Wear loose fitting clothing that is easily removed. 2. Leave jewelry and other valuables at home.  3. Remove all body piercing jewelry and leave at home. 4. Total time from sign-in until discharge is approximately 2-3 hours. 5. You should go home directly after your procedure and rest. You can resume normal activities the day after your procedure. 6. The day of your procedure you should not:  Drive  Make legal decisions  Operate machinery  Drink alcohol  Return to work   You may call the office (Dept: [phone]) before 5:00pm, or page the doctor on call [phone]) after 5:00pm, for further instructions, if necessary.   Insurance Information YOU WILL NEED TO CHECK WITH YOUR INSURANCE COMPANY FOR THE BENEFITS OF COVERAGE YOU HAVE FOR THIS PROCEDURE.  UNFORTUNATELY, NOT ALL INSURANCE COMPANIES HAVE BENEFITS TO COVER ALL OR PART OF THESE TYPES OF PROCEDURES.  IT IS YOUR RESPONSIBILITY TO CHECK YOUR BENEFITS, HOWEVER, WE WILL BE GLAD TO ASSIST YOU WITH ANY CODES YOUR INSURANCE COMPANY MAY NEED.    PLEASE NOTE THAT MOST INSURANCE COMPANIES WILL NOT COVER A SCREENING COLONOSCOPY FOR PEOPLE UNDER THE AGE OF 50  IF YOU HAVE BCBS INSURANCE, YOU MAY HAVE BENEFITS FOR A SCREENING COLONOSCOPY BUT IF POLYPS ARE FOUND THE DIAGNOSIS WILL CHANGE AND THEN YOU MAY HAVE A DEDUCTIBLE THAT WILL NEED TO BE MET. SO PLEASE MAKE SURE YOU CHECK YOUR BENEFITS FOR A SCREENING COLONOSCOPY AS WELL AS A DIAGNOSTIC COLONOSCOPY.

## 2018-03-01 NOTE — Progress Notes (Signed)
OK to schedule

## 2018-03-24 NOTE — Progress Notes (Signed)
Due to a change in Dr.Rourks schedule, we need to move pts tcs appt. I have tried to call the pt twice, LM both times to call the office to reschedule.

## 2018-03-30 NOTE — Progress Notes (Signed)
Pt called back, rescheduled to 06/10/18 at 12:00. I have mailed her new instructions and called Kim in endo and moved her appt.

## 2018-04-25 ENCOUNTER — Encounter: Payer: Self-pay | Admitting: *Deleted

## 2018-04-27 ENCOUNTER — Other Ambulatory Visit: Payer: Self-pay | Admitting: Family Medicine

## 2018-05-17 ENCOUNTER — Encounter: Payer: BC Managed Care – PPO | Admitting: Family Medicine

## 2018-05-30 NOTE — Progress Notes (Signed)
Pt's procedure was re-scheduled due to COVID 19 and CDC guidelines.  Pt aware that we are mailing her out new instructions.  Endo notified.

## 2018-06-24 LAB — COMPLETE METABOLIC PANEL WITH GFR
AG Ratio: 1.3 (calc) (ref 1.0–2.5)
ALT: 43 U/L — ABNORMAL HIGH (ref 6–29)
AST: 37 U/L — ABNORMAL HIGH (ref 10–35)
Albumin: 4.5 g/dL (ref 3.6–5.1)
Alkaline phosphatase (APISO): 100 U/L (ref 37–153)
BILIRUBIN TOTAL: 0.6 mg/dL (ref 0.2–1.2)
BUN: 13 mg/dL (ref 7–25)
CO2: 29 mmol/L (ref 20–32)
Calcium: 9.8 mg/dL (ref 8.6–10.4)
Chloride: 107 mmol/L (ref 98–110)
Creat: 0.94 mg/dL (ref 0.50–0.99)
GFR, EST AFRICAN AMERICAN: 76 mL/min/{1.73_m2} (ref >=60)
GFR, EST NON AFRICAN AMERICAN: 66 mL/min/{1.73_m2} (ref >=60)
Globulin: 3.4 g/dL (calc) (ref 1.9–3.7)
Glucose, Bld: 88 mg/dL (ref 65–99)
POTASSIUM: 4.4 mmol/L (ref 3.5–5.3)
Sodium: 142 mmol/L (ref 135–146)
Total Protein: 7.9 g/dL (ref 6.1–8.1)

## 2018-06-24 LAB — CBC
HCT: 42.3 % (ref 35.0–45.0)
Hemoglobin: 14.1 g/dL (ref 11.7–15.5)
MCH: 28.9 pg (ref 27.0–33.0)
MCHC: 33.3 g/dL (ref 32.0–36.0)
MCV: 86.7 fL (ref 80.0–100.0)
MPV: 10.9 fL (ref 7.5–12.5)
PLATELETS: 239 10*3/uL (ref 140–400)
RBC: 4.88 10*6/uL (ref 3.80–5.10)
RDW: 14.1 % (ref 11.0–15.0)
WBC: 5.6 10*3/uL (ref 3.8–10.8)

## 2018-06-24 LAB — LIPID PANEL
CHOL/HDL RATIO: 4.6 (calc) (ref <5.0)
Cholesterol: 195 mg/dL (ref <200)
HDL: 42 mg/dL — AB (ref >=50)
LDL Cholesterol (Calc): 133 mg/dL (calc) — ABNORMAL HIGH
Non-HDL Cholesterol (Calc): 153 mg/dL (calc) — ABNORMAL HIGH (ref <130)
TRIGLYCERIDES: 102 mg/dL (ref <150)

## 2018-06-24 LAB — T3, FREE: T3, Free: 2.6 pg/mL (ref 2.3–4.2)

## 2018-06-24 LAB — HEMOGLOBIN A1C
HEMOGLOBIN A1C: 5.5 %{Hb} (ref <5.7)
Mean Plasma Glucose: 111 (calc)
eAG (mmol/L): 6.2 (calc)

## 2018-06-24 LAB — T4, FREE: Free T4: 0.9 ng/dL (ref 0.8–1.8)

## 2018-06-24 LAB — TSH: TSH: 7.2 mIU/L — ABNORMAL HIGH (ref 0.40–4.50)

## 2018-06-25 ENCOUNTER — Other Ambulatory Visit: Payer: Self-pay | Admitting: Family Medicine

## 2018-06-25 DIAGNOSIS — E039 Hypothyroidism, unspecified: Secondary | ICD-10-CM

## 2018-06-25 DIAGNOSIS — R7989 Other specified abnormal findings of blood chemistry: Secondary | ICD-10-CM

## 2018-06-27 ENCOUNTER — Encounter: Payer: Self-pay | Admitting: Family Medicine

## 2018-06-27 ENCOUNTER — Ambulatory Visit (INDEPENDENT_AMBULATORY_CARE_PROVIDER_SITE_OTHER): Payer: BC Managed Care – PPO | Admitting: Family Medicine

## 2018-06-27 ENCOUNTER — Other Ambulatory Visit: Payer: Self-pay

## 2018-06-27 ENCOUNTER — Encounter: Payer: BC Managed Care – PPO | Admitting: Family Medicine

## 2018-06-27 ENCOUNTER — Other Ambulatory Visit (HOSPITAL_COMMUNITY)
Admission: RE | Admit: 2018-06-27 | Discharge: 2018-06-27 | Disposition: A | Discharge status: Home / Self Care | Payer: BC Managed Care – PPO | Source: Ambulatory Visit | Attending: Family Medicine | Admitting: Family Medicine

## 2018-06-27 VITALS — BP 150/90 | HR 91 | Resp 12 | Ht 63.0 in | Wt 159.4 lb

## 2018-06-27 DIAGNOSIS — I10 Essential (primary) hypertension: Secondary | ICD-10-CM | POA: Diagnosis not present

## 2018-06-27 DIAGNOSIS — H547 Unspecified visual loss: Secondary | ICD-10-CM

## 2018-06-27 DIAGNOSIS — Z124 Encounter for screening for malignant neoplasm of cervix: Secondary | ICD-10-CM | POA: Diagnosis not present

## 2018-06-27 DIAGNOSIS — E785 Hyperlipidemia, unspecified: Secondary | ICD-10-CM | POA: Diagnosis not present

## 2018-06-27 DIAGNOSIS — Z23 Encounter for immunization: Secondary | ICD-10-CM

## 2018-06-27 DIAGNOSIS — Z0000 Encounter for general adult medical examination without abnormal findings: Secondary | ICD-10-CM

## 2018-06-27 MED ORDER — HYDROCHLOROTHIAZIDE 25 MG PO TABS: 25.0000 mg | ORAL_TABLET | Freq: Every day | ORAL | 3 refills | Status: DC

## 2018-06-27 MED ORDER — AMLODIPINE BESYLATE 10 MG PO TABS: 10.0000 mg | ORAL_TABLET | Freq: Every day | ORAL | 3 refills | Status: DC

## 2018-06-27 MED ORDER — LOSARTAN POTASSIUM 100 MG PO TABS: 100.0000 mg | ORAL_TABLET | Freq: Every day | ORAL | 3 refills | Status: DC

## 2018-06-27 NOTE — Patient Instructions (Signed)
Please follow up in 8 weeks, and BRING your medications  Blood pressure is high today,start  an additional medication , HCTZ 25 mg one daily, has been sent to your pharmacy for you to start taking TODAY Please continue cozaar 100 mg one daily, and amlodipine 10 mg one daily as you have been doing before  Please get non fasting chem 7 and EGFR 1 week before you next visit  You are referred for eye exam due to reduced vision  Shingrix #2 today  You do NEED to reduce fried and fatty foods as you bad cholesterol is too high, also commit to regular exercise  Think about what you will eat, plan ahead. Choose " clean, green, fresh or frozen" over canned, processed or packaged foods which are more sugary, salty and fatty. 70 to 75% of food eaten should be vegetables and fruit. Three meals at set times with snacks allowed between meals, but they must be fruit or vegetables. Aim to eat over a 12 hour period , example 7 am to 7 pm, and STOP after  your last meal of the day. Drink water,generally about 64 ounces per day, no other drink is as healthy. Fruit juice is best enjoyed in a healthy way, by EATING the fruit. Social distancing. Frequent hand washing with soap and water Keeping your hands off of your face. These 3 practices will help to keep both you and your community healthy during this time. Please practice them faithfully!   Thanks for choosing [facility], we consider it a privelige to serve you.

## 2018-06-27 NOTE — Progress Notes (Signed)
    Faith Hood     MRN: [id number]      DOB: 1955-07-29  HPI: Patient is in for annual physical exam. Immunization is reviewed , and  Updated Blood pressure elevated and medication dsoe is increased Discussed low fat diet.  PE: BP (!) 150/90   Pulse 91   Resp 12   Ht 5\' 3"  (1.6 m)   Wt 159 lb 6.4 oz (72.3 kg)   SpO2 97%   BMI 28.24 kg/m  Pleasant  female, alert and oriented x 3, in no cardio-pulmonary distress. Afebrile. HEENT No facial trauma or asymetry. Sinuses non tender  Extra occullar muscles intact,External ears normal, tympanic membranes clear. Oropharynx moist, no exudate. Neck: supple, no adenopathy,JVD or thyromegaly.No bruits.  Chest: Clear to ascultation bilaterally.No crackles or wheezes. Non tender to palpation  Breast: No asymetry,no masses or lumps. No tenderness. No nipple discharge or inversion. No axillary or supraclavicular adenopathy  Cardiovascular system; Heart sounds normal,  S1 and  S2 ,no S3.  No murmur, or thrill. Apical beat not displaced Peripheral pulses normal.  Abdomen: Soft, non tender, no organomegaly or masses. No bruits. Bowel sounds normal. No guarding, tenderness or rebound.  Rectal:  Normal sphincter tone. No rectal mass. Guaiac negative stool.  GU: External genitalia normal female genitalia , normal female distribution of hair. No lesions. Urethral meatus normal in size, no  Prolapse, no lesions visibly  Present. Bladder non tender. Vagina pink and moist , with no visible lesions , discharge present . Adequate pelvic support no  cystocele or rectocele noted Cervix pink and appears healthy, no lesions or ulcerations noted, no discharge noted from os Uterus normal size, no adnexal masses, no cervical motion or adnexal tenderness.   Musculoskeletal exam: Full ROM of spine, hips , shoulders and knees. No deformity ,swelling or crepitus noted. No muscle wasting or atrophy.   Neurologic: Cranial nerves 2 to 12  intact. Power, tone ,sensation and reflexes normal throughout. No disturbance in gait. No tremor.  Skin: Intact, no ulceration, erythema , scaling or rash noted. Pigmentation normal throughout  Psych; Normal mood and affect. Judgement and concentration normal   Assessment & Plan:  Annual physical exam Annual exam as documented. . Immunization and cancer screening needs are specifically addressed at this visit.   Hypertension Uncontrolled , add HCTZ to medications DASH diet and commitment to daily physical activity for a minimum of 30 minutes discussed and encouraged, as a part of hypertension management. The importance of attaining a healthy weight is also discussed.  BP/Weight 06/27/2018 01/06/2018 03/11/2017 02/20/2017 11/23/2016 05/18/2016 5/68/1275  Systolic BP 170 017 494 496 759 163 846  Diastolic BP 90 78 82 76 64 78 64  Wt. (Lbs) 159.4 158 164 170 167.75 164 163  BMI 28.24 27.99 30 33.2 30.68 30 29.81   F/U in 8 weeks    Reduced vision Needs evaluation by ophthalmology due to poor vision  Need for shingles vaccine After obtaining informed consent, the vaccine is  administered , with no adverse effect noted at the time of administration.

## 2018-06-29 LAB — CYTOLOGY - PAP
Diagnosis: NEGATIVE
HPV: NOT DETECTED

## 2018-07-03 ENCOUNTER — Encounter: Payer: Self-pay | Admitting: Family Medicine

## 2018-07-03 DIAGNOSIS — Z23 Encounter for immunization: Secondary | ICD-10-CM | POA: Insufficient documentation

## 2018-07-03 DIAGNOSIS — H547 Unspecified visual loss: Secondary | ICD-10-CM | POA: Insufficient documentation

## 2018-07-03 NOTE — Assessment & Plan Note (Signed)
Needs evaluation by ophthalmology due to poor vision

## 2018-07-03 NOTE — Assessment & Plan Note (Signed)
Uncontrolled , add HCTZ to medications DASH diet and commitment to daily physical activity for a minimum of 30 minutes discussed and encouraged, as a part of hypertension management. The importance of attaining a healthy weight is also discussed.  BP/Weight 06/27/2018 01/06/2018 03/11/2017 02/20/2017 11/23/2016 05/18/2016 06/17/5256  Systolic BP 948 347 583 074 600 298 473  Diastolic BP 90 78 82 76 64 78 64  Wt. (Lbs) 159.4 158 164 170 167.75 164 163  BMI 28.24 27.99 30 33.2 30.68 30 29.81   F/U in 8 weeks

## 2018-07-03 NOTE — Assessment & Plan Note (Signed)
Hyperlipidemia:Low fat diet discussed and encouraged.   Lipid Panel  Lab Results  Component Value Date   CHOL 195 06/23/2018   HDL 42 (L) 06/23/2018   LDLCALC 133 (H) 06/23/2018   TRIG 102 06/23/2018   CHOLHDL 4.6 06/23/2018   Not at goal, needs to commit to regular exercise and reduce intake of fried and fatty foods

## 2018-07-03 NOTE — Assessment & Plan Note (Signed)
After obtaining informed consent, the vaccine is  administered , with no adverse effect noted at the time of administration.  

## 2018-07-03 NOTE — Assessment & Plan Note (Signed)
Annual exam as documented. . Immunization and cancer screening needs are specifically addressed at this visit.  

## 2018-07-07 ENCOUNTER — Ambulatory Visit (HOSPITAL_COMMUNITY): Admission: RE | Admit: 2018-07-07 | Payer: BC Managed Care – PPO | Source: Ambulatory Visit

## 2018-07-19 ENCOUNTER — Other Ambulatory Visit: Payer: Self-pay

## 2018-07-19 ENCOUNTER — Ambulatory Visit (HOSPITAL_COMMUNITY)
Admission: RE | Admit: 2018-07-19 | Discharge: 2018-07-19 | Disposition: A | Discharge status: Home / Self Care | Payer: BC Managed Care – PPO | Source: Ambulatory Visit | Attending: Family Medicine | Admitting: Family Medicine

## 2018-07-19 DIAGNOSIS — R7989 Other specified abnormal findings of blood chemistry: Secondary | ICD-10-CM | POA: Diagnosis not present

## 2018-07-19 DIAGNOSIS — E039 Hypothyroidism, unspecified: Secondary | ICD-10-CM | POA: Diagnosis present

## 2018-07-20 ENCOUNTER — Encounter: Payer: Self-pay | Admitting: Family Medicine

## 2018-08-08 ENCOUNTER — Telehealth: Payer: Self-pay | Admitting: *Deleted

## 2018-08-08 NOTE — Telephone Encounter (Signed)
Lmom for pt to call us back to schedule COVID screening.

## 2018-08-09 ENCOUNTER — Encounter: Payer: Self-pay | Admitting: Family Medicine

## 2018-08-09 ENCOUNTER — Ambulatory Visit (INDEPENDENT_AMBULATORY_CARE_PROVIDER_SITE_OTHER): Payer: BC Managed Care – PPO | Admitting: Family Medicine

## 2018-08-09 ENCOUNTER — Other Ambulatory Visit: Payer: Self-pay

## 2018-08-09 VITALS — BP 139/67 | Ht 63.0 in | Wt 160.0 lb

## 2018-08-09 DIAGNOSIS — I10 Essential (primary) hypertension: Secondary | ICD-10-CM | POA: Diagnosis not present

## 2018-08-09 DIAGNOSIS — E785 Hyperlipidemia, unspecified: Secondary | ICD-10-CM | POA: Diagnosis not present

## 2018-08-09 DIAGNOSIS — E663 Overweight: Secondary | ICD-10-CM | POA: Diagnosis not present

## 2018-08-09 NOTE — Patient Instructions (Addendum)
F/U in office with MD, needs BP re eval, in 4 months, call if you need me sooner please  PLEASE commit to daily exercise at home for 30 minutes minimum, and ENJOY!!! No changes in medication at this time  Weight loss goal of 4 to 5 pounds will help Blood pressure.  Fasting lipid, cmp and EGFR 1 week before next visit   [place] stands for "Dietary Approaches to Stop Hypertension." The DASH eating plan is a healthy eating plan that has been shown to reduce high blood pressure (hypertension). It may also reduce your risk for type 2 diabetes, heart disease, and stroke. The DASH eating plan may also help with weight loss. What are tips for following this plan?  General guidelines  Avoid eating more than 2,300 mg (milligrams) of salt (sodium) a day. If you have hypertension, you may need to reduce your sodium intake to 1,500 mg a day.  Limit alcohol intake to no more than 1 drink a day for nonpregnant women and 2 drinks a day for men. One drink equals 12 oz of beer, 5 oz of wine, or 1 oz of hard liquor.  Work with your health care provider to maintain a healthy body weight or to lose weight. Ask what an ideal weight is for you.  Get at least 30 minutes of exercise that causes your heart to beat faster (aerobic exercise) most days of the week. Activities may include walking, swimming, or biking.  Work with your health care provider or diet and nutrition specialist (dietitian) to adjust your eating plan to your individual calorie needs. Reading food labels   Check food labels for the amount of sodium per serving. Choose foods with less than 5 percent of the Daily Value of sodium. Generally, foods with less than 300 mg of sodium per serving fit into this eating plan.  To find whole grains, look for the word "whole" as the first word in the ingredient list. Shopping  Buy products labeled as "low-sodium" or "no salt added."  Buy fresh foods. Avoid canned foods and premade or  frozen meals. Cooking  Avoid adding salt when cooking. Use salt-free seasonings or herbs instead of table salt or sea salt. Check with your health care provider or pharmacist before using salt substitutes.  Do not fry foods. Cook foods using healthy methods such as baking, boiling, grilling, and broiling instead.  Cook with heart-healthy oils, such as olive, canola, soybean, or sunflower oil. Meal planning  Eat a balanced diet that includes: ? 5 or more servings of fruits and vegetables each day. At each meal, try to fill half of your plate with fruits and vegetables. ? Up to 6-8 servings of whole grains each day. ? Less than 6 oz of lean meat, poultry, or fish each day. A 3-oz serving of meat is about the same size as a deck of cards. One egg equals 1 oz. ? 2 servings of low-fat dairy each day. ? A serving of nuts, seeds, or beans 5 times each week. ? Heart-healthy fats. Healthy fats called Omega-3 fatty acids are found in foods such as flaxseeds and coldwater fish, like sardines, salmon, and mackerel.  Limit how much you eat of the following: ? Canned or prepackaged foods. ? Food that is high in trans fat, such as fried foods. ? Food that is high in saturated fat, such as fatty meat. ? Sweets, desserts, sugary drinks, and other foods with added sugar. ? Full-fat dairy products.  Do  not salt foods before eating.  Try to eat at least 2 vegetarian meals each week.  Eat more home-cooked food and less restaurant, buffet, and fast food.  When eating at a restaurant, ask that your food be prepared with less salt or no salt, if possible. What foods are recommended? The items listed may not be a complete list. Talk with your dietitian about what dietary choices are best for you. Grains Whole-grain or whole-wheat bread. Whole-grain or whole-wheat pasta. Brown rice. [place]. Bulgur. Whole-grain and low-sodium cereals. Pita bread. Low-fat, low-sodium crackers. Whole-wheat flour  tortillas. Vegetables Fresh or frozen vegetables (raw, steamed, roasted, or grilled). Low-sodium or reduced-sodium tomato and vegetable juice. Low-sodium or reduced-sodium tomato sauce and tomato paste. Low-sodium or reduced-sodium canned vegetables. Fruits All fresh, dried, or frozen fruit. Canned fruit in natural juice (without added sugar). Meat and other protein foods Skinless chicken or [place]. Ground chicken or [place]. Pork with fat trimmed off. Fish and seafood. Egg whites. Dried beans, peas, or lentils. Unsalted nuts, nut butters, and seeds. Unsalted canned beans. Lean cuts of beef with fat trimmed off. Low-sodium, lean deli meat. Dairy Low-fat (1%) or fat-free (skim) milk. Fat-free, low-fat, or reduced-fat cheeses. Nonfat, low-sodium ricotta or cottage cheese. Low-fat or nonfat yogurt. Low-fat, low-sodium cheese. Fats and oils Soft margarine without trans fats. Vegetable oil. Low-fat, reduced-fat, or light mayonnaise and salad dressings (reduced-sodium). Canola, safflower, olive, soybean, and sunflower oils. Avocado. Seasoning and other foods Herbs. Spices. Seasoning mixes without salt. Unsalted popcorn and pretzels. Fat-free sweets. What foods are not recommended? The items listed may not be a complete list. Talk with your dietitian about what dietary choices are best for you. Grains Baked goods made with fat, such as croissants, muffins, or some breads. Dry pasta or rice meal packs. Vegetables Creamed or fried vegetables. Vegetables in a cheese sauce. Regular canned vegetables (not low-sodium or reduced-sodium). Regular canned tomato sauce and paste (not low-sodium or reduced-sodium). Regular tomato and vegetable juice (not low-sodium or reduced-sodium). Angie Fava. Olives. Fruits Canned fruit in a light or heavy syrup. Fried fruit. Fruit in cream or butter sauce. Meat and other protein foods Fatty cuts of meat. Ribs. Fried meat. Berniece Salines. Sausage. Bologna and other processed lunch meats.  Salami. Fatback. Hotdogs. Bratwurst. Salted nuts and seeds. Canned beans with added salt. Canned or smoked fish. Whole eggs or egg yolks. Chicken or [place] with skin. Dairy Whole or 2% milk, cream, and half-and-half. Whole or full-fat cream cheese. Whole-fat or sweetened yogurt. Full-fat cheese. Nondairy creamers. Whipped toppings. Processed cheese and cheese spreads. Fats and oils Butter. Stick margarine. Lard. Shortening. Ghee. Bacon fat. Tropical oils, such as coconut, palm kernel, or palm oil. Seasoning and other foods Salted popcorn and pretzels. Onion salt, garlic salt, seasoned salt, table salt, and sea salt. Worcestershire sauce. Tartar sauce. Barbecue sauce. Teriyaki sauce. Soy sauce, including reduced-sodium. Steak sauce. Canned and packaged gravies. Fish sauce. Oyster sauce. Cocktail sauce. Horseradish that you find on the shelf. Ketchup. Mustard. Meat flavorings and tenderizers. Bouillon cubes. Hot sauce and Tabasco sauce. Premade or packaged marinades. Premade or packaged taco seasonings. Relishes. Regular salad dressings. Where to find more information:  National Heart, Lung, and [place]: https://wilson-eaton.com/  American Heart Association: www.heart.org Summary  The DASH eating plan is a healthy eating plan that has been shown to reduce high blood pressure (hypertension). It may also reduce your risk for type 2 diabetes, heart disease, and stroke.  With the DASH eating plan, you should limit salt (sodium) intake  to 2,300 mg a day. If you have hypertension, you may need to reduce your sodium intake to 1,500 mg a day.  When on the DASH eating plan, aim to eat more fresh fruits and vegetables, whole grains, lean proteins, low-fat dairy, and heart-healthy fats.  Work with your health care provider or diet and nutrition specialist (dietitian) to adjust your eating plan to your individual calorie needs. This information is not intended to replace advice given to you by your health  care provider. Make sure you discuss any questions you have with your health care provider. Document Released: 01/29/2011 Document Revised: 02/03/2016 Document Reviewed: 02/03/2016 Elsevier Interactive Patient Education  2019 [employer].

## 2018-08-09 NOTE — Progress Notes (Signed)
Virtual Visit via Telephone Note  I connected with Faith Hood on 08/09/18 at  9:00 AM EDT by telephone and verified that I am speaking with the correct person using two identifiers.  Location: Patient: home Provider: office   I discussed the limitations, risks, security and privacy concerns of performing an evaluation and management service by telephone and the availability of in person appointments. I also discussed with the patient that there may be a patient responsible charge related to this service. The patient expressed understanding and agreed to proceed.   History of Present Illness: F/Updated lab needed at/ before next visit. Chronic problems, update labs , meds , and routine health maintenance Denies recent fever or chills. Denies sinus pressure, nasal congestion, ear pain or sore throat. Denies chest congestion, productive cough or wheezing. Denies chest pains, palpitations and leg swelling Denies abdominal pain, nausea, vomiting,diarrhea or constipation.   Denies dysuria, frequency, hesitancy or incontinence. Denies joint pain, swelling and limitation in mobility. Denies headaches, seizures, numbness, or tingling. Denies depression, anxiety or insomnia. Denies skin break down or rash.       Observations/Objective: BP 139/67   Ht 5\' 3"  (1.6 m)   Wt 160 lb (72.6 kg)   BMI 28.34 kg/m  Good communication with no confusion and intact memory. Alert and oriented x 3 No signs of respiratory distress during speech    Assessment and Plan:  Hypertension Uncontrolled DASH diet and commitment to daily physical activity for a minimum of 30 minutes discussed and encouraged, as a part of hypertension management. The importance of attaining a healthy weight is also discussed.  BP/Weight 08/09/2018 06/27/2018 01/06/2018 03/11/2017 02/20/2017 11/23/2016 5/62/1308  Systolic BP 657 846 962 952 841 324 401  Diastolic BP 67 90 78 82 76 64 78  Wt. (Lbs) 160 159.4 158 164 170 167.75  164  BMI 28.34 28.24 27.99 30 33.2 30.68 30     Needs to exercise and lose weight, no med change  Hyperlipidemia Hyperlipidemia:Low fat diet discussed and encouraged.   Lipid Panel  Lab Results  Component Value Date   CHOL 195 06/23/2018   HDL 42 (L) 06/23/2018   LDLCALC 133 (H) 06/23/2018   TRIG 102 06/23/2018   CHOLHDL 4.6 06/23/2018  needs to lower fat intake and commit to regular exercise    Overweight  Patient re-educated about  the importance of commitment to a  minimum of 150 minutes of exercise per week as able.  The importance of healthy food choices with portion control discussed, as well as eating regularly and within a 12 hour window most days. The need to choose "clean , green" food 50 to 75% of the time is discussed, as well as to make water the primary drink and set a goal of 64 ounces water daily.    Weight /BMI 08/09/2018 06/27/2018 01/06/2018  WEIGHT 160 lb 159 lb 6.4 oz 158 lb  HEIGHT 5\' 3"  5\' 3"  5\' 3"   BMI 28.34 kg/m2 28.24 kg/m2 27.99 kg/m2       Follow Up Instructions:    I discussed the assessment and treatment plan with the patient. The patient was provided an opportunity to ask questions and all were answered. The patient agreed with the plan and demonstrated an understanding of the instructions.   The patient was advised to call back or seek an in-person evaluation if the symptoms worsen or if the condition fails to improve as anticipated.  I provided 22 minutes of non-face-to-face time during this encounter.  Tula Nakayama, MD

## 2018-08-09 NOTE — Telephone Encounter (Signed)
Pt called back and is scheduled for her COVID 19 screening on 08/12/2018.  Pt is aware to remain in quarantine once testing is done.  Pt voiced understanding.

## 2018-08-12 ENCOUNTER — Other Ambulatory Visit: Payer: Self-pay

## 2018-08-12 ENCOUNTER — Other Ambulatory Visit (HOSPITAL_COMMUNITY)
Admission: RE | Admit: 2018-08-12 | Discharge: 2018-08-12 | Disposition: A | Discharge status: Home / Self Care | Payer: BC Managed Care – PPO | Source: Ambulatory Visit | Attending: Internal Medicine | Admitting: Internal Medicine

## 2018-08-12 ENCOUNTER — Other Ambulatory Visit (HOSPITAL_COMMUNITY): Payer: BC Managed Care – PPO

## 2018-08-12 DIAGNOSIS — Z1159 Encounter for screening for other viral diseases: Secondary | ICD-10-CM | POA: Insufficient documentation

## 2018-08-13 LAB — NOVEL CORONAVIRUS, NAA (HOSP ORDER, SEND-OUT TO REF LAB; TAT 18-24 HRS): SARS-CoV-2, NAA: NOT DETECTED

## 2018-08-14 ENCOUNTER — Encounter: Payer: Self-pay | Admitting: Family Medicine

## 2018-08-14 NOTE — Assessment & Plan Note (Signed)
Uncontrolled DASH diet and commitment to daily physical activity for a minimum of 30 minutes discussed and encouraged, as a part of hypertension management. The importance of attaining a healthy weight is also discussed.  BP/Weight 08/09/2018 06/27/2018 01/06/2018 03/11/2017 02/20/2017 11/23/2016 4/41/7127  Systolic BP 871 836 725 500 164 290 379  Diastolic BP 67 90 78 82 76 64 78  Wt. (Lbs) 160 159.4 158 164 170 167.75 164  BMI 28.34 28.24 27.99 30 33.2 30.68 30     Needs to exercise and lose weight, no med change

## 2018-08-14 NOTE — Assessment & Plan Note (Signed)
  Patient re-educated about  the importance of commitment to a  minimum of 150 minutes of exercise per week as able.  The importance of healthy food choices with portion control discussed, as well as eating regularly and within a 12 hour window most days. The need to choose "clean , green" food 50 to 75% of the time is discussed, as well as to make water the primary drink and set a goal of 64 ounces water daily.    Weight /BMI 08/09/2018 06/27/2018 01/06/2018  WEIGHT 160 lb 159 lb 6.4 oz 158 lb  HEIGHT 5\' 3"  5\' 3"  5\' 3"   BMI 28.34 kg/m2 28.24 kg/m2 27.99 kg/m2

## 2018-08-14 NOTE — Assessment & Plan Note (Signed)
Hyperlipidemia:Low fat diet discussed and encouraged.   Lipid Panel  Lab Results  Component Value Date   CHOL 195 06/23/2018   HDL 42 (L) 06/23/2018   LDLCALC 133 (H) 06/23/2018   TRIG 102 06/23/2018   CHOLHDL 4.6 06/23/2018  needs to lower fat intake and commit to regular exercise

## 2018-08-17 ENCOUNTER — Encounter (HOSPITAL_COMMUNITY)
Admission: RE | Disposition: A | Discharge status: Home / Self Care | Payer: Self-pay | Source: Home / Self Care | Attending: Internal Medicine

## 2018-08-17 ENCOUNTER — Other Ambulatory Visit: Payer: Self-pay

## 2018-08-17 ENCOUNTER — Ambulatory Visit (HOSPITAL_COMMUNITY)
Admission: RE | Admit: 2018-08-17 | Discharge: 2018-08-17 | Disposition: A | Discharge status: Home / Self Care | Payer: BC Managed Care – PPO | Attending: Internal Medicine | Admitting: Internal Medicine

## 2018-08-17 ENCOUNTER — Encounter (HOSPITAL_COMMUNITY): Payer: Self-pay | Admitting: *Deleted

## 2018-08-17 DIAGNOSIS — Z79899 Other long term (current) drug therapy: Secondary | ICD-10-CM | POA: Diagnosis not present

## 2018-08-17 DIAGNOSIS — Z1211 Encounter for screening for malignant neoplasm of colon: Secondary | ICD-10-CM | POA: Diagnosis present

## 2018-08-17 DIAGNOSIS — I10 Essential (primary) hypertension: Secondary | ICD-10-CM | POA: Insufficient documentation

## 2018-08-17 DIAGNOSIS — Z8249 Family history of ischemic heart disease and other diseases of the circulatory system: Secondary | ICD-10-CM | POA: Diagnosis not present

## 2018-08-17 DIAGNOSIS — Z7982 Long term (current) use of aspirin: Secondary | ICD-10-CM | POA: Diagnosis not present

## 2018-08-17 HISTORY — PX: COLONOSCOPY: SHX5424

## 2018-08-17 SURGERY — COLONOSCOPY
Anesthesia: Moderate Sedation

## 2018-08-17 MED ORDER — MEPERIDINE HCL 50 MG/ML IJ SOLN
INTRAMUSCULAR | Status: AC
  Filled 2018-08-17: qty 1

## 2018-08-17 MED ORDER — MIDAZOLAM HCL 5 MG/5ML IJ SOLN
INTRAMUSCULAR | Status: AC
  Filled 2018-08-17: qty 10

## 2018-08-17 MED ORDER — SODIUM CHLORIDE 0.9 % IV SOLN
INTRAVENOUS | Status: DC
  Administered 2018-08-17: 13:00:00 via INTRAVENOUS

## 2018-08-17 MED ORDER — STERILE WATER FOR IRRIGATION IR SOLN
Status: DC | PRN
  Administered 2018-08-17: 1.5 mL

## 2018-08-17 MED ORDER — ONDANSETRON HCL 4 MG/2ML IJ SOLN
INTRAMUSCULAR | Status: DC | PRN
  Administered 2018-08-17: 4 mg via INTRAVENOUS

## 2018-08-17 MED ORDER — ONDANSETRON HCL 4 MG/2ML IJ SOLN
INTRAMUSCULAR | Status: AC
  Filled 2018-08-17: qty 2

## 2018-08-17 MED ORDER — MEPERIDINE HCL 100 MG/ML IJ SOLN
INTRAMUSCULAR | Status: DC | PRN
  Administered 2018-08-17: 25 mg via INTRAVENOUS

## 2018-08-17 MED ORDER — MIDAZOLAM HCL 5 MG/5ML IJ SOLN
INTRAMUSCULAR | Status: DC | PRN
  Administered 2018-08-17 (×2): 1 mg via INTRAVENOUS
  Administered 2018-08-17: 2 mg via INTRAVENOUS

## 2018-08-17 NOTE — H&P (Signed)
@LOGO @   Primary Care Physician:  Fayrene Helper, MD Primary Gastroenterologist:  Dr. Gala Romney  Pre-Procedure History & Physical: HPI:  Faith Hood is a 63 y.o. female is here for a screening colonoscopy.  Negative colonoscopy 2009.  No current GI symptoms.  No family history of colon cancer.  Past Medical History:  Diagnosis Date  . Cerebral vascular disease    Carotid ultrasound in 10/2011: Mild plaque; tortuous vessels; no definite luminal obstruction.  . Hepatic steatosis    By CT scan  . Hyperlipidemia   . Hypertension   . Overweight(278.02)   . Uterine leiomyoma    By CT scan    Past Surgical History:  Procedure Laterality Date  . COLONOSCOPY  12/2007   Negative screening study  . DIAGNOSTIC LAPAROSCOPY  1978   Gynecologic problems    Prior to Admission medications   Medication Sig Start Date End Date Taking? Authorizing Provider  amLODipine (NORVASC) 10 MG tablet Take 1 tablet (10 mg total) by mouth daily. 06/27/18  Yes Fayrene Helper, MD  aspirin EC 81 MG tablet Take 81 mg by mouth daily.   Yes [provider]  Calcium Carbonate-Vitamin D (CALCIUM 600/VITAMIN D) 600-400 MG-UNIT per chew tablet Chew 1 tablet by mouth daily.     Yes [provider]  hydrochlorothiazide (HYDRODIURIL) 25 MG tablet Take 1 tablet (25 mg total) by mouth daily. 06/27/18  Yes Fayrene Helper, MD  losartan (COZAAR) 100 MG tablet Take 1 tablet (100 mg total) by mouth daily. 06/27/18  Yes Fayrene Helper, MD  Multiple Vitamin (MULTIVITAMIN) tablet Take 1 tablet by mouth daily.     Yes [provider]    Allergies as of 02/28/2018  . (No Known Allergies)    Family History  Problem Relation Age of Onset  . Pancreatic cancer Mother 39       Diagnosed in 2009; currently in hospice pt. htn  . Hypertension Mother        + Sister x4  . Cancer Father 58       brain tumor  . Cancer Other        MGM side cervical cancer /MGM side  stomach cancer   . Colon  cancer Neg Hx     Social History   Socioeconomic History  . Marital status: Single    Spouse name: Not on file  . Number of children: Not on file  . Years of education: Not on file  . Highest education level: Not on file  Occupational History  . Occupation: Middle Education officer, museum    Comment: X25 years  Social Needs  . Financial resource strain: Not on file  . Food insecurity    Worry: Not on file    Inability: Not on file  . Transportation needs    Medical: Not on file    Non-medical: Not on file  Tobacco Use  . Smoking status: Never Smoker  . Smokeless tobacco: Never Used  Substance and Sexual Activity  . Alcohol use: No    Comment: Never Drink  . Drug use: No  . Sexual activity: Not on file  Lifestyle  . Physical activity    Days per week: Not on file    Minutes per session: Not on file  . Stress: Not on file  Relationships  . Social Herbalist on phone: Not on file    Gets together: Not on file    Attends religious service: Not on  file    Active member of club or organization: Not on file    Attends meetings of clubs or organizations: Not on file    Relationship status: Not on file  . Intimate partner violence    Fear of current or ex partner: Not on file    Emotionally abused: Not on file    Physically abused: Not on file    Forced sexual activity: Not on file  Other Topics Concern  . Not on file  Social History Narrative  . Not on file    Review of Systems: See HPI, otherwise negative ROS  Physical Exam: BP (!) 152/90   Pulse 97   Temp 99 F (37.2 C) (Oral)   Resp (!) 25   SpO2 100%  General:   Alert,  Well-developed, well-nourished, pleasant and cooperative in NAD Lungs:  Clear throughout to auscultation.   No wheezes, crackles, or rhonchi. No acute distress. Heart:  Regular rate and rhythm; no murmurs, clicks, rubs,  or gallops. Abdomen:  Soft, nontender and nondistended. No masses, hepatosplenomegaly or hernias noted. Normal bowel  sounds, without guarding, and without rebound.   Msk:  Symmetrical without gross deformities. Normal posture. Pulses:  Normal pulses noted. Extremities:  Without clubbing or edema. Neurologic:  Alert and  oriented x4;  grossly normal neurologically. Skin:  Intact without significant lesions or rashes. Cervical Nodes:  No significant cervical adenopathy. Psych:  Alert and cooperative. Normal mood and affect.  Impression/Plan: Faith Hood is now here to undergo a screening colonoscopy.  The risks, benefits, limitations, alternatives and imponderables have been reviewed with the patient. Questions have been answered. All parties are agreeable.   Risks, benefits, limitations, imponderables and alternatives regarding colonoscopy have been reviewed with the patient. Questions have been answered. All parties agreeable.     Notice:  This dictation was prepared with Dragon dictation along with smaller phrase technology. Any transcriptional errors that result from this process are unintentional and may not be corrected upon review.

## 2018-08-17 NOTE — Op Note (Signed)
[facility] Patient Name: Faith Hood Procedure Date: 08/17/2018 1:09 PM MRN: [id number] Date of Birth: 1956-01-31 Attending MD: Norvel Richards , MD CSN: [id number] Age: 63 Admit Type: Outpatient Procedure:                Colonoscopy Indications:              Screening for colorectal malignant neoplasm Providers:                Norvel Richards, MD, Charlsie Quest. Theda Sers RN, RN,                            Raphael Gibney, Technician Referring MD:             Norwood Levo. Simpson MD, MD Medicines:                Midazolam 4 mg IV, Meperidine 25 mg IV Complications:            No immediate complications. Estimated Blood Loss:     Estimated blood loss: none. Procedure:                Pre-Anesthesia Assessment:                           - Prior to the procedure, a History and Physical                            was performed, and patient medications and                            allergies were reviewed. The patient's tolerance of                            previous anesthesia was also reviewed. The risks                            and benefits of the procedure and the sedation                            options and risks were discussed with the patient.                            All questions were answered, and informed consent                            was obtained. Prior Anticoagulants: The patient has                            taken no previous anticoagulant or antiplatelet                            agents. ASA Grade Assessment: III - A patient with                            severe systemic disease. After reviewing the risks  and benefits, the patient was deemed in                            satisfactory condition to undergo the procedure.                           After obtaining informed consent, the colonoscope                            was passed under direct vision. Throughout the                            procedure, the patient's blood  pressure, pulse, and                            oxygen saturations were monitored continuously. The                            [id number]-[id number] ([id number]) scope was introduced through                            the anus and advanced to the the cecum, identified                            by appendiceal orifice and ileocecal valve. The                            colonoscopy was performed without difficulty. The                            patient tolerated the procedure well. The quality                            of the bowel preparation was adequate. The                            ileocecal valve, appendiceal orifice, and rectum                            were photographed. Scope In: 1:27:35 PM Scope Out: 1:42:04 PM Scope Withdrawal Time: 0 hours 7 minutes 27 seconds  Total Procedure Duration: 0 hours 14 minutes 29 seconds  Findings:      The perianal and digital rectal examinations were normal.      The entire examined colon appeared normal on direct and retroflexion       views. Impression:               - The entire examined colon is normal on direct and                            retroflexion views.                           - No specimens collected. Moderate Sedation:      Moderate (conscious) sedation was administered  by the endoscopy nurse       and supervised by the endoscopist. The following parameters were       monitored: oxygen saturation, heart rate, blood pressure, respiratory       rate, EKG, adequacy of pulmonary ventilation, and response to care.       Total physician intraservice time was 19 minutes. Recommendation:           - Patient has a contact number available for                            emergencies. The signs and symptoms of potential                            delayed complications were discussed with the                            patient. Return to normal activities tomorrow.                            Written discharge instructions were provided to the                             patient.                           - Resume previous diet.                           - Repeat colonoscopy in 10 years for screening                            purposes.                           - Return to GI office PRN. Procedure Code(s):        --- Professional ---                           [id number], Colonoscopy, flexible; diagnostic, including                            collection of specimen(s) by brushing or washing,                            when performed (separate procedure)                           G0500, Moderate sedation services provided by the                            same physician or other qualified health care                            professional performing a gastrointestinal  endoscopic service that sedation supports,                            requiring the presence of an independent trained                            observer to assist in the monitoring of the                            patient's level of consciousness and physiological                            status; initial 15 minutes of intra-service time;                            patient age 61 years or older (additional time may                            be reported with [phone], as appropriate) Diagnosis Code(s):        --- Professional ---                           Z12.11, Encounter for screening for malignant                            neoplasm of colon CPT copyright 2019 American Medical Association. All rights reserved. The codes documented in this report are preliminary and upon coder review may  be revised to meet current compliance requirements. Cristopher Estimable. Tariya Morrissette, MD Norvel Richards, MD 08/17/2018 2:27:12 PM This report has been signed electronically. Number of Addenda: 0

## 2018-08-17 NOTE — Discharge Instructions (Signed)
°  Colonoscopy Discharge Instructions  Read the instructions outlined below and refer to this sheet in the next few weeks. These discharge instructions provide you with general information on caring for yourself after you leave the hospital. Your doctor may also give you specific instructions. While your treatment has been planned according to the most current medical practices available, unavoidable complications occasionally occur. If you have any problems or questions after discharge, call Dr. Gala Romney at [phone]. ACTIVITY  You may resume your regular activity, but move at a slower pace for the next 24 hours.   Take frequent rest periods for the next 24 hours.   Walking will help get rid of the air and reduce the bloated feeling in your belly (abdomen).   No driving for 24 hours (because of the medicine (anesthesia) used during the test).    Do not sign any important legal documents or operate any machinery for 24 hours (because of the anesthesia used during the test).  NUTRITION  Drink plenty of fluids.   You may resume your normal diet as instructed by your doctor.   Begin with a light meal and progress to your normal diet. Heavy or fried foods are harder to digest and may make you feel sick to your stomach (nauseated).   Avoid alcoholic beverages for 24 hours or as instructed.  MEDICATIONS  You may resume your normal medications unless your doctor tells you otherwise.  WHAT YOU CAN EXPECT TODAY  Some feelings of bloating in the abdomen.   Passage of more gas than usual.   Spotting of blood in your stool or on the toilet paper.  IF YOU HAD POLYPS REMOVED DURING THE COLONOSCOPY:  No aspirin products for 7 days or as instructed.   No alcohol for 7 days or as instructed.   Eat a soft diet for the next 24 hours.  FINDING OUT THE RESULTS OF YOUR TEST Not all test results are available during your visit. If your test results are not back during the visit, make an appointment  with your caregiver to find out the results. Do not assume everything is normal if you have not heard from your caregiver or the medical facility. It is important for you to follow up on all of your test results.  SEEK IMMEDIATE MEDICAL ATTENTION IF:  You have more than a spotting of blood in your stool.   Your belly is swollen (abdominal distention).   You are nauseated or vomiting.   You have a temperature over 101.   You have abdominal pain or discomfort that is severe or gets worse throughout the day.    I discussed my findings and recommendations with Faith Hood via telephone  Repeat screening colonoscopy in 10 years

## 2018-08-23 ENCOUNTER — Encounter (HOSPITAL_COMMUNITY): Payer: Self-pay | Admitting: Internal Medicine

## 2018-08-24 ENCOUNTER — Ambulatory Visit: Payer: BC Managed Care – PPO | Admitting: Family Medicine

## 2018-12-12 ENCOUNTER — Encounter: Payer: Self-pay | Admitting: Family Medicine

## 2018-12-12 ENCOUNTER — Other Ambulatory Visit: Payer: Self-pay

## 2018-12-12 ENCOUNTER — Ambulatory Visit (INDEPENDENT_AMBULATORY_CARE_PROVIDER_SITE_OTHER): Payer: BC Managed Care – PPO | Admitting: Family Medicine

## 2018-12-12 VITALS — BP 130/80 | HR 98 | Temp 97.9°F | Resp 15 | Ht 63.0 in | Wt 156.0 lb

## 2018-12-12 DIAGNOSIS — Z23 Encounter for immunization: Secondary | ICD-10-CM

## 2018-12-12 DIAGNOSIS — Z1231 Encounter for screening mammogram for malignant neoplasm of breast: Secondary | ICD-10-CM

## 2018-12-12 DIAGNOSIS — E786 Lipoprotein deficiency: Secondary | ICD-10-CM

## 2018-12-12 DIAGNOSIS — R7401 Elevation of levels of liver transaminase levels: Secondary | ICD-10-CM | POA: Diagnosis not present

## 2018-12-12 DIAGNOSIS — I10 Essential (primary) hypertension: Secondary | ICD-10-CM | POA: Diagnosis not present

## 2018-12-12 NOTE — Patient Instructions (Signed)
Annual physical exam , no pap, in May 2021  Please schedule mammogram at checkout  Call in March for the lab order for May appointment  Flu vaccine today  Need to commit to regular exercise to improve your HDL ( good cholesterol)  We will add hepatitis panel to recent labs , if negative you will need an [place] of your liver since enzymes are high  Use alleve / ibuprofen fro arthritis pain, we will provide a few samples NO TYLENOL because of liver tests today  Thanks for choosing [facility], we consider it a privelige to serve you.

## 2018-12-12 NOTE — Progress Notes (Signed)
   Faith Hood     MRN: [id number]      DOB: 01/25/1956   HPI Faith Hood is here for follow up and re-evaluation of chronic medical conditions, medication management and review of any available recent lab and radiology data.  Preventive health is updated, specifically  Cancer screening and Immunization.   C/o epigastric discomfort following colonoscopy in 07/2018 The PT denies any adverse reactions to current medications since the last visit.  2 week h/o intermittent left buttock pain ,a s well as pain in the ankles with swelling, right is worse than the left, on avg twice per week   ROS Denies recent fever or chills. Denies sinus pressure, nasal congestion, ear pain or sore throat. Denies chest congestion, productive cough or wheezing. Denies chest pains, palpitations and leg swelling Denies abdominal pain, nausea, vomiting,diarrhea or constipation.   Denies dysuria, frequency, hesitancy or incontinence. Denies headaches, seizures, numbness, or tingling. Denies depression, anxiety or insomnia. Denies skin break down or rash.   PE  BP 130/80   Pulse (!) 101   Temp 97.9 F (36.6 C) (Temporal)   Resp 15   Ht 5\' 3"  (1.6 m)   Wt 156 lb (70.8 kg)   SpO2 98%   BMI 27.63 kg/m   Patient alert and oriented and in no cardiopulmonary distress.  HEENT: No facial asymmetry, EOMI,     Neck supple .  Chest: Clear to auscultation bilaterally.  CVS: S1, S2 no murmurs, no S3.Regular rate.  ABD: Soft non tender.   Ext: No edema  MS: Adequate ROM spine, shoulders, hips and knees.  Skin: Intact, no ulcerations or rash noted.  Psych: Good eye contact, normal affect. Memory intact not anxious or depressed appearing.  CNS: CN 2-12 intact, power,  normal throughout.no focal deficits noted.   Assessment & Plan  Hypertension Controlled, no change in medication DASH diet and commitment to daily physical activity for a minimum of 30 minutes discussed and encouraged, as a part of  hypertension management. The importance of attaining a healthy weight is also discussed.  BP/Weight 12/12/2018 08/17/2018 08/09/2018 06/27/2018 01/06/2018 03/11/2017 Q000111Q  Systolic BP [id number] Q000111Q 123456 0000000 [id number]  Diastolic BP 80 69 67 90 78 82 76  Wt. (Lbs) 156 - 160 159.4 158 164 170  BMI 27.63 - 28.34 28.24 27.99 30 33.2       Transaminitis Need to re eval for infectious cause , if negative, re imaging of liver may be indicated, due to significant increase   Hyperlipidemia Hyperlipidemia:Low fat diet discussed and encouraged.   Lipid Panel  Lab Results  Component Value Date   CHOL 141 12/12/2018   HDL 23 (L) 12/12/2018   LDLCALC 99 12/12/2018   TRIG 96 12/12/2018   CHOLHDL 6.1 (H) 12/12/2018   Needs to commit to exercise to increase HDL

## 2018-12-15 ENCOUNTER — Encounter: Payer: Self-pay | Admitting: Family Medicine

## 2018-12-15 NOTE — Assessment & Plan Note (Signed)
Controlled, no change in medication DASH diet and commitment to daily physical activity for a minimum of 30 minutes discussed and encouraged, as a part of hypertension management. The importance of attaining a healthy weight is also discussed.  BP/Weight 12/12/2018 08/17/2018 08/09/2018 06/27/2018 01/06/2018 03/11/2017 Q000111Q  Systolic BP [id number] [id number] [id number] Q000111Q 123456 0000000 [id number]  Diastolic BP 80 69 67 90 78 82 76  Wt. (Lbs) 156 - 160 159.4 158 164 170  BMI 27.63 - 28.34 28.24 27.99 30 33.2

## 2018-12-15 NOTE — Assessment & Plan Note (Addendum)
Need to re eval for infectious cause , if negative, re imaging of liver may be indicated, due to significant increase

## 2018-12-15 NOTE — Assessment & Plan Note (Signed)
Hyperlipidemia:Low fat diet discussed and encouraged.   Lipid Panel  Lab Results  Component Value Date   CHOL 141 12/12/2018   HDL 23 (L) 12/12/2018   LDLCALC 99 12/12/2018   TRIG 96 12/12/2018   CHOLHDL 6.1 (H) 12/12/2018   Needs to commit to exercise to increase HDL

## 2018-12-19 ENCOUNTER — Other Ambulatory Visit: Payer: Self-pay | Admitting: Family Medicine

## 2018-12-19 DIAGNOSIS — R7989 Other specified abnormal findings of blood chemistry: Secondary | ICD-10-CM

## 2018-12-19 DIAGNOSIS — R748 Abnormal levels of other serum enzymes: Secondary | ICD-10-CM

## 2018-12-19 LAB — COMPLETE METABOLIC PANEL WITH GFR
AG Ratio: 1.2 (calc) (ref 1.0–2.5)
ALT: 63 U/L — ABNORMAL HIGH (ref 6–29)
AST: 64 U/L — ABNORMAL HIGH (ref 10–35)
Albumin: 4 g/dL (ref 3.6–5.1)
Alkaline phosphatase (APISO): 86 U/L (ref 37–153)
BUN: 13 mg/dL (ref 7–25)
CO2: 26 mmol/L (ref 20–32)
Calcium: 9.4 mg/dL (ref 8.6–10.4)
Chloride: 104 mmol/L (ref 98–110)
Creat: 0.9 mg/dL (ref 0.50–0.99)
GFR, Est African American: 80 mL/min/{1.73_m2} (ref >=60)
GFR, Est Non African American: 69 mL/min/{1.73_m2} (ref >=60)
Globulin: 3.4 g/dL (calc) (ref 1.9–3.7)
Glucose, Bld: 90 mg/dL (ref 65–99)
Potassium: 4.1 mmol/L (ref 3.5–5.3)
Sodium: 138 mmol/L (ref 135–146)
Total Bilirubin: 0.8 mg/dL (ref 0.2–1.2)
Total Protein: 7.4 g/dL (ref 6.1–8.1)

## 2018-12-19 LAB — ACUTE HEP PANEL AND HEP B SURFACE AB
HEPATITIS C ANTIBODY REFILL$(REFL): NONREACTIVE
Hep A IgM: NONREACTIVE
Hep B C IgM: NONREACTIVE
Hepatitis B Surface Ag: NONREACTIVE
SIGNAL TO CUT-OFF: 0.02 (ref <1.00)

## 2018-12-19 LAB — LIPID PANEL
Cholesterol: 141 mg/dL (ref <200)
HDL: 23 mg/dL — ABNORMAL LOW (ref >=50)
LDL Cholesterol (Calc): 99 mg/dL (calc)
Non-HDL Cholesterol (Calc): 118 mg/dL (calc) (ref <130)
Total CHOL/HDL Ratio: 6.1 (calc) — ABNORMAL HIGH (ref <5.0)
Triglycerides: 96 mg/dL (ref <150)

## 2018-12-19 LAB — TEST AUTHORIZATION

## 2018-12-19 LAB — REFLEX TIQ

## 2018-12-19 NOTE — Progress Notes (Signed)
as

## 2018-12-19 NOTE — Progress Notes (Signed)
US liver

## 2018-12-20 ENCOUNTER — Encounter: Payer: Self-pay | Admitting: Internal Medicine

## 2018-12-26 ENCOUNTER — Other Ambulatory Visit: Payer: Self-pay

## 2018-12-26 ENCOUNTER — Ambulatory Visit (HOSPITAL_COMMUNITY)
Admission: RE | Admit: 2018-12-26 | Discharge: 2018-12-26 | Disposition: A | Discharge status: Home / Self Care | Payer: BC Managed Care – PPO | Source: Ambulatory Visit | Attending: Family Medicine | Admitting: Family Medicine

## 2018-12-26 DIAGNOSIS — R7989 Other specified abnormal findings of blood chemistry: Secondary | ICD-10-CM | POA: Diagnosis present

## 2019-01-01 NOTE — Progress Notes (Signed)
Referring Provider: Fayrene Helper, MD Primary Care Physician:  Fayrene Helper, MD Primary GI Physician: Dr. Gala Romney  Chief Complaint  Patient presents with  . Elevated Hepatic Enzymes  . Nausea    daily x few months but getting worse, no vomiting    HPI:   Faith Hood is a 63 y.o. female presenting today with a GI history significant for fatty liver who was last seen by our staff at the time of her colonoscopy in June 2020.  Colonoscopy was normal with recommendations for repeat in 10 years.  She presents today at the request of Dr. Moshe Cipro due to elevated liver enzymes.  AST and ALT have been mildly elevated in the 30-40 range since November 2018.  AST and ALT in the 60 range in October 2020.  Acute hepatitis panel was added on due to LFT increase which was negative.  An ultrasound of her liver was ordered and she was referred to GI.  RUQ ultrasound on 12/26/2018 revealing gallbladder within normal limits, CBD normal, liver within normal limits without focal lesions.   Today: No family history of liver disease. No alcohol use. No history of illicit drug use. No yellowing of skin or eyes. No confusion. No Intermittent swelling in lower extremities intermittently about 1.5 months ago. No abdominal swelling. No bruising or bleeding. No OTC supplements. She does drink herbal teas from time to time. No family history of celiac disease. Sister with thyroid disorder (had thyroidectomy). No IBD. No wilson's disease or alpha 1 antitryspin deficiency.   She has had a loss of appetite due to nausea. Lost about 8 lbs since June 2020. Nausea started after the colonoscopy. Nausea was mild. This has been progressing. Now, all day every day. Wakes up in the morning and feels ok. After she eats the nausea starts. As soon as she eats, she will get nauseous. Associated dizziness with nausea at times. Doesn't eat enough to ever feel full. No similar symptoms in the past. If she eats any sugar, she will  be significantly nauseated for the rest of the day. Doesn't eat fried or fatty foods. Soups do ok. Breads make her nauseous.  No vomiting. Bacon and boiled egg, fried chicken with mashed potatoes and gravy yesterday. No abdominal pain. No heartburn or acid reflux. No dysphagia. No new medications.  Not taking anything to help with nausea. She is still drinking plenty of water.    Fatigue x 10 years. No fever. Occasional chills. No pre-syncope. Some lightheadedness with position change. No chest pain or heart palpitations. No shortness of breath or cough. No urinary symptoms. No vaginal bleeding.   BMs about 3 a week. States she has never been regular. This is her baseline. Typically soft and formed. No diarrhea. No hard stools or straining. No blood in the stool. No black stools.  Past Medical History:  Diagnosis Date  . Cerebral vascular disease    Carotid ultrasound in 10/2011: Mild plaque; tortuous vessels; no definite luminal obstruction.  . Hepatic steatosis    By CT scan  . Hyperlipidemia   . Hypertension   . Overweight(278.02)   . Uterine leiomyoma    By CT scan    Past Surgical History:  Procedure Laterality Date  . COLONOSCOPY  12/2007   Negative screening study  . COLONOSCOPY N/A 08/17/2018   Procedure: COLONOSCOPY;  Surgeon: Daneil Dolin, MD;  Location: [facility];  Service: Endoscopy;  Laterality: N/A;  12:00-rescheduled to 4/17 same time per  [place]   Gynecologic problems    Current Outpatient Medications  Medication Sig Dispense Refill  . amLODipine (NORVASC) 10 MG tablet Take 1 tablet (10 mg total) by mouth daily. 90 tablet 3  . aspirin EC 81 MG tablet Take 81 mg by mouth daily.    . hydrochlorothiazide (HYDRODIURIL) 25 MG tablet Take 1 tablet (25 mg total) by mouth daily. 90 tablet 3  . losartan (COZAAR) 100 MG tablet Take 1 tablet (100 mg total) by mouth daily. 90 tablet 3  . ondansetron (ZOFRAN) 4 MG tablet Take 1 tablet (4 mg  total) by mouth every 8 (eight) hours as needed for nausea or vomiting. 30 tablet 1  . pantoprazole (PROTONIX) 40 MG tablet Take 1 tablet (40 mg total) by mouth daily. 30 tablet 5   No current facility-administered medications for this visit.     Allergies as of 01/02/2019  . (No Known Allergies)    Family History  Problem Relation Age of Onset  . Pancreatic cancer Mother 29       Diagnosed in 2009; currently in hospice pt. htn  . Hypertension Mother        + Sister x4  . Cancer Father 25       brain tumor  . Cancer Other        MGM side cervical cancer /MGM side  stomach cancer   . Colon cancer Neg Hx     Social History   Socioeconomic History  . Marital status: Single    Spouse name: Not on file  . Number of children: Not on file  . Years of education: Not on file  . Highest education level: Not on file  Occupational History  . Occupation: Middle Education officer, museum    Comment: X25 years  Social Needs  . Financial resource strain: Not on file  . Food insecurity    Worry: Not on file    Inability: Not on file  . Transportation needs    Medical: Not on file    Non-medical: Not on file  Tobacco Use  . Smoking status: Never Smoker  . Smokeless tobacco: Never Used  Substance and Sexual Activity  . Alcohol use: No    Comment: Never Drink  . Drug use: No  . Sexual activity: Not on file  Lifestyle  . Physical activity    Days per week: Not on file    Minutes per session: Not on file  . Stress: Not on file  Relationships  . Social Herbalist on phone: Not on file    Gets together: Not on file    Attends religious service: Not on file    Active member of club or organization: Not on file    Attends meetings of clubs or organizations: Not on file    Relationship status: Not on file  Other Topics Concern  . Not on file  Social History Narrative  . Not on file    Review of Systems: Gen: See HPI CV: See HPI Resp: See HPI GI: See HPI Derm: Denies rash  Psych: Denies depression, anxiety  Heme: Denies bruising, bleeding  Physical Exam: BP (!) 141/86   Pulse (!) 110   Temp (!) 97.1 F (36.2 C) (Oral)   Ht 5\' 3"  (1.6 m)   Wt 152 lb 3.2 oz (69 kg)   BMI 26.96 kg/m  General:   Alert and oriented. No distress noted. Pleasant and cooperative.  Head:  Normocephalic and atraumatic. Eyes:  Conjuctiva clear without scleral icterus. Heart:  S1, S2 present without murmurs appreciated. Lungs:  Clear to auscultation bilaterally. No wheezes, rales, or rhonchi. No distress.  Abdomen:  +BS, soft, non-tender and non-distended. No rebound or guarding. No HSM or masses noted. Msk:  Symmetrical without gross deformities. Normal posture. Extremities:  Without edema. Neurologic:  Alert and  oriented x4 Psych: Normal mood and affect.

## 2019-01-02 ENCOUNTER — Other Ambulatory Visit: Payer: Self-pay

## 2019-01-02 ENCOUNTER — Encounter: Payer: Self-pay | Admitting: Gastroenterology

## 2019-01-02 ENCOUNTER — Ambulatory Visit (INDEPENDENT_AMBULATORY_CARE_PROVIDER_SITE_OTHER): Payer: BC Managed Care – PPO | Admitting: Gastroenterology

## 2019-01-02 ENCOUNTER — Telehealth: Payer: Self-pay

## 2019-01-02 ENCOUNTER — Telehealth: Payer: Self-pay | Admitting: Gastroenterology

## 2019-01-02 VITALS — BP 141/86 | HR 110 | Temp 97.1°F | Ht 63.0 in | Wt 152.2 lb

## 2019-01-02 DIAGNOSIS — R110 Nausea: Secondary | ICD-10-CM | POA: Diagnosis not present

## 2019-01-02 DIAGNOSIS — R7989 Other specified abnormal findings of blood chemistry: Secondary | ICD-10-CM | POA: Insufficient documentation

## 2019-01-02 MED ORDER — ONDANSETRON HCL 4 MG PO TABS: 4.0000 mg | ORAL_TABLET | Freq: Three times a day (TID) | ORAL | 1 refills | Status: DC | PRN

## 2019-01-02 MED ORDER — PANTOPRAZOLE SODIUM 40 MG PO TBEC: 40.0000 mg | DELAYED_RELEASE_TABLET | Freq: Every day | ORAL | 5 refills | Status: DC

## 2019-01-02 NOTE — Telephone Encounter (Signed)
Faith Hood, unfortunately I missed adding on celiac labs to this patients orders during her office visit. I spoke with patient and verified she will have labs drawn at [facility] lab.   Could you place the orders for IgA total and TTG IgA and faxed them over to [facility] please?

## 2019-01-02 NOTE — Patient Instructions (Addendum)
Please have your labs and HIDA scan completed.  Stop herbal teas for now.   Start Protonix 40 mg daily 30 minutes before breakfast.  I have sent a prescription to your pharmacy.  I have sent in Zofran 4 mg.  You may use this every 8 hours as needed for nausea.  Please follow a bland diet for now and avoid all fried, fatty, or greasy foods.  We will see you back in 2 months.  Call if you have questions or concerns prior.  Aliene Altes, PA-C [facility]

## 2019-01-02 NOTE — Telephone Encounter (Signed)
Tried to call home number, no answer, left detailed message to inform her of HIDA appt.

## 2019-01-02 NOTE — Telephone Encounter (Signed)
HIDA scheduled for 01/05/19 at 10:00am, arrive at 9:45am. NPO after midnight before test. Tried to call mobile number as requested by pt to inform her of appt, no answer, no VM set-up. Message sent to pt via MyChart to inform her of appt.

## 2019-01-02 NOTE — Assessment & Plan Note (Addendum)
63 year old female with past medical history significant for hypertension, hyperlipidemia, and fatty liver noted on CT in 2009 who presents for evaluation of elevated LFTs and nausea without vomiting.  LFTs discussed below. Nausea has been present since June 2020 and is worsening.  Currently all throughout the day.  Typically starts after eating breakfast.  At first denies eating fried or fatty foods; however, with 24-hour recall, she reports eating bacon and a boiled egg for breakfast and fried chicken with mesh potatoes and gravy yesterday.  Also reports breads will make her nauseous. She has had associated 8 pound weight loss since June due to nausea.  Denies heartburn, acid reflux, dysphagia, early satiety, abdominal pain, jaundice, bright red blood per rectum, melena, or fever.  Occasional chills.  BMs are at baseline.  No diarrhea.  No NSAIDs.  Family history significant for mother with history of pancreatic cancer.  Ultrasound on 12/26/2018 for evaluation of elevated LFTs with gallbladder within normal limits, normal CBD, liver within normal limits.  CMP in October with AST and ALT in the 60s, alk phos and total bilirubin normal.  No recent CBC on file.  Etiology of nausea is not clear.  As there is a postprandial component, query possible gallbladder etiology vs celiac disease with nausea with breads. Not sure if LFT elevation and nausea are related.  At this point, will empirically start patient on daily PPI and prescribe Zofran as needed.  Pursue HIDA scan to evaluate gallbladder function.  Check for celiac disease. Additional labs including CBC, HFP, iron panel, and INR for elevated LFTs. If above evaluation is unrevealing, will have low threshold for CT abdomen and pelvis with elevated LFTs, nausea, and family history of pancreatic cancer. Follow-up in 2 months.

## 2019-01-02 NOTE — Assessment & Plan Note (Addendum)
63 year old female with past medical history of hypertension, hyperlipidemia, and fatty liver on CT in 2009 who presents for further evaluation of elevated LFTs.  LFTs have been mildly elevated in the 30-40 range since November 2018.  Most recently AST and ALT in the 60 range in October 2020.  Alk phos and total bilirubin normal.  Acute hepatitis panel was negative.  Ultrasound on 12/26/2018 with normal gallbladder, CBD normal, liver within normal limits without focal lesions.  She denies any family history of liver disease.  Sister with thyroid disorder.  No family history of IBD or other autoimmune conditions.  She denies any alcohol use or illicit drug use.  Admits to drinking herbal teas occasionally. No jaundice, confusion, easy bruising or bleeding, or rashes.  Reports intermittent swelling in the lower extremities x1.5 months.  Denies abdominal swelling.  She has also had loss of appetite due to significant postprandial nausea without vomiting that started in June 2020 and has been progressing.  Associated weight loss of 8 pounds due to nausea and decreased appetite.  No abdominal pain or fever.  Occasional chills. Mother with history of pancreatic cancer.  Etiology of elevated LFTs is not clear.  Advised she stop drinking herbal teas. As elevation is mild, we will start with obtaining additional laboratory evaluation including rechecking her LFTs to ensure they are not continuing to increase, CBC, iron panel, and INR.  She does have history of fatty liver; however, she is not significantly overweight and has not gained a significant amount of weight in the last year.  Unclear if nausea is related to elevated LFTs.  I am checking a HIDA scan, starting her on a Protonix daily, and Zofran as needed.  Depending on results, will have low threshold for CT abdomen and pelvis for further evaluation as family history of pancreatic cancer is slightly concerning. Follow-up in 2 months.

## 2019-01-02 NOTE — Telephone Encounter (Signed)
Orders placed. Spoke with Vaughan Basta at the lab and she is aware that added orders were faxed to [facility] lab.

## 2019-01-05 ENCOUNTER — Encounter (HOSPITAL_COMMUNITY): Payer: BC Managed Care – PPO

## 2019-01-24 ENCOUNTER — Other Ambulatory Visit: Payer: Self-pay

## 2019-01-24 ENCOUNTER — Ambulatory Visit (HOSPITAL_COMMUNITY)
Admission: RE | Admit: 2019-01-24 | Discharge: 2019-01-24 | Disposition: A | Discharge status: Home / Self Care | Payer: BC Managed Care – PPO | Source: Ambulatory Visit | Attending: Gastroenterology | Admitting: Gastroenterology

## 2019-01-24 ENCOUNTER — Encounter (HOSPITAL_COMMUNITY): Payer: Self-pay

## 2019-01-24 DIAGNOSIS — R110 Nausea: Secondary | ICD-10-CM | POA: Diagnosis present

## 2019-01-24 MED ORDER — TECHNETIUM TC 99M MEBROFENIN IV KIT
5.0000 | PACK | Freq: Once | INTRAVENOUS | Status: AC | PRN
  Administered 2019-01-24: 08:00:00 5.3 via INTRAVENOUS

## 2019-01-24 NOTE — Progress Notes (Signed)
HIDA within normal limits meaning her gallbladder is functioning normally. She has labs from last visit that need to be completed.   Elmo Putt, can you let patient know of HIDA result and remind her about labs that need to be completed? Can you see how her nausea is doing? I started her on Protonix empirically at last visit.

## 2019-01-25 ENCOUNTER — Other Ambulatory Visit: Payer: Self-pay | Admitting: Gastroenterology

## 2019-01-26 NOTE — Progress Notes (Signed)
Please let patient know her liver function tests are still elevated in the 60's but are stable at this time. CBC is normal. Her ferritin (indicator of iron stores) is high. With this, we need to test for hemochromatosis (a condition where too much iron builds up in your body). Still waiting on celiac serologies to result.   Faith Hood, can you see if hemochromatosis lab can be added on to current labs?

## 2019-01-27 ENCOUNTER — Telehealth: Payer: Self-pay | Admitting: Internal Medicine

## 2019-01-27 NOTE — Telephone Encounter (Signed)
Pt was returning call from yesterday. [phone]

## 2019-01-28 NOTE — Progress Notes (Signed)
No evidence of celiac disease. Hemochromatosis lab still pending.

## 2019-01-30 ENCOUNTER — Telehealth: Payer: Self-pay | Admitting: Internal Medicine

## 2019-01-30 NOTE — Telephone Encounter (Signed)
Pt was returning call. [phone]

## 2019-01-31 ENCOUNTER — Telehealth: Payer: Self-pay | Admitting: Internal Medicine

## 2019-01-31 ENCOUNTER — Other Ambulatory Visit: Payer: Self-pay | Admitting: Gastroenterology

## 2019-01-31 DIAGNOSIS — R110 Nausea: Secondary | ICD-10-CM

## 2019-01-31 MED ORDER — ESOMEPRAZOLE MAGNESIUM 40 MG PO CPDR: 40.0000 mg | DELAYED_RELEASE_CAPSULE | Freq: Every day | ORAL | 5 refills | Status: DC

## 2019-01-31 NOTE — Telephone Encounter (Signed)
PATIENT RETURNED CALL, PLEASE CALL BACK  °

## 2019-02-03 LAB — HEPATIC FUNCTION PANEL
AG Ratio: 1.3 (calc) (ref 1.0–2.5)
ALT: 60 U/L — ABNORMAL HIGH (ref 6–29)
AST: 66 U/L — ABNORMAL HIGH (ref 10–35)
Albumin: 4.1 g/dL (ref 3.6–5.1)
Alkaline phosphatase (APISO): 91 U/L (ref 37–153)
Bilirubin, Direct: 0.2 mg/dL (ref 0.0–0.2)
Globulin: 3.2 g/dL (calc) (ref 1.9–3.7)
Indirect Bilirubin: 0.6 mg/dL (calc) (ref 0.2–1.2)
Total Bilirubin: 0.8 mg/dL (ref 0.2–1.2)
Total Protein: 7.3 g/dL (ref 6.1–8.1)

## 2019-02-03 LAB — CBC WITH DIFFERENTIAL/PLATELET
Absolute Monocytes: 183 cells/uL — ABNORMAL LOW (ref 200–950)
Basophils Absolute: 19 cells/uL (ref 0–200)
Basophils Relative: 0.3 %
Eosinophils Absolute: 321 cells/uL (ref 15–500)
Eosinophils Relative: 5.1 %
HCT: 38.5 % (ref 35.0–45.0)
Hemoglobin: 12.5 g/dL (ref 11.7–15.5)
Lymphs Abs: 1128 cells/uL (ref 850–3900)
MCH: 28 pg (ref 27.0–33.0)
MCHC: 32.5 g/dL (ref 32.0–36.0)
MCV: 86.3 fL (ref 80.0–100.0)
MPV: 11.2 fL (ref 7.5–12.5)
Monocytes Relative: 2.9 %
Neutro Abs: 4649 cells/uL (ref 1500–7800)
Neutrophils Relative %: 73.8 %
Platelets: 207 10*3/uL (ref 140–400)
RBC: 4.46 10*6/uL (ref 3.80–5.10)
RDW: 14.3 % (ref 11.0–15.0)
Total Lymphocyte: 17.9 %
WBC: 6.3 10*3/uL (ref 3.8–10.8)

## 2019-02-03 LAB — IRON,TIBC AND FERRITIN PANEL
%SAT: 23 % (calc) (ref 16–45)
Ferritin: 430 ng/mL — ABNORMAL HIGH (ref 16–288)
Iron: 67 ug/dL (ref 45–160)
TIBC: 288 mcg/dL (calc) (ref 250–450)

## 2019-02-03 LAB — TEST AUTHORIZATION

## 2019-02-03 LAB — HEMOCHROMATOSIS DNA-PCR(C282Y,H63D)

## 2019-02-03 LAB — IGA: Immunoglobulin A: 421 mg/dL — ABNORMAL HIGH (ref 70–320)

## 2019-02-03 LAB — PROTIME-INR
INR: 1.1
Prothrombin Time: 11.2 s (ref 9.0–11.5)

## 2019-02-03 LAB — TISSUE TRANSGLUTAMINASE ABS,IGG,IGA
(tTG) Ab, IgA: 1 U/mL
(tTG) Ab, IgG: 1 U/mL

## 2019-02-06 NOTE — Progress Notes (Signed)
We test for 2 of the common variants (h63d and c282y) in the HFE gene when testing for hemochromatosis. She tested positive for the h63d variant. This could mean she has hemochromatosis (condition where too much iron builds up in her system) but could also be that she is a carrier. However, her ferritin is elevated which is reflective of her iron stores. She needs referral to hematology for further evaluation and management.

## 2019-02-07 ENCOUNTER — Other Ambulatory Visit: Payer: Self-pay | Admitting: *Deleted

## 2019-02-07 DIAGNOSIS — R7989 Other specified abnormal findings of blood chemistry: Secondary | ICD-10-CM

## 2019-02-14 ENCOUNTER — Inpatient Hospital Stay (HOSPITAL_COMMUNITY): Payer: BC Managed Care – PPO | Attending: Hematology | Admitting: Hematology

## 2019-02-14 ENCOUNTER — Encounter (HOSPITAL_COMMUNITY): Payer: Self-pay | Admitting: Hematology

## 2019-02-14 ENCOUNTER — Other Ambulatory Visit: Payer: Self-pay

## 2019-02-14 ENCOUNTER — Inpatient Hospital Stay (HOSPITAL_COMMUNITY): Payer: BC Managed Care – PPO

## 2019-02-14 VITALS — BP 136/80 | HR 89 | Temp 97.9°F | Resp 18 | Ht 64.0 in | Wt 151.1 lb

## 2019-02-14 DIAGNOSIS — E785 Hyperlipidemia, unspecified: Secondary | ICD-10-CM | POA: Insufficient documentation

## 2019-02-14 DIAGNOSIS — K760 Fatty (change of) liver, not elsewhere classified: Secondary | ICD-10-CM | POA: Diagnosis not present

## 2019-02-14 DIAGNOSIS — R7989 Other specified abnormal findings of blood chemistry: Secondary | ICD-10-CM

## 2019-02-14 DIAGNOSIS — I10 Essential (primary) hypertension: Secondary | ICD-10-CM | POA: Diagnosis not present

## 2019-02-14 DIAGNOSIS — E119 Type 2 diabetes mellitus without complications: Secondary | ICD-10-CM | POA: Diagnosis not present

## 2019-02-14 DIAGNOSIS — Z7982 Long term (current) use of aspirin: Secondary | ICD-10-CM | POA: Diagnosis not present

## 2019-02-14 DIAGNOSIS — R7401 Elevation of levels of liver transaminase levels: Secondary | ICD-10-CM

## 2019-02-14 DIAGNOSIS — Z79899 Other long term (current) drug therapy: Secondary | ICD-10-CM | POA: Diagnosis not present

## 2019-02-14 LAB — HEPATITIS C ANTIBODY: HCV Ab: NONREACTIVE

## 2019-02-14 NOTE — Assessment & Plan Note (Signed)
1. Hemachromatosis - Elevated liver enzymes noted first noted in 01/18/2017: AST: 41; ALT: 37 - 12/26/18: Abdominal ultrasound negative for any acute findings - 01/24/19: HIDA scan: Negative for any acute findings. - Liver enzymes continued to trend up, most recent, 01/25/19: AST: 66, ALT: 60 - 01/25/19: Labs: Hemoglobin 12.5, MCV 86.3, ferritin 430, iron 67, TIBC 288, saturation 23%.   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 -Discussed lab findings with patient.  Heterozygous HFE gene mutations are typically not associated with iron overload.  She will need a MRI of the liver to better assess liver iron concentration.  We will also plan to evaluate elevated liver enzymes with MRI and complete additional lab studies including hepatitis C, rheumatoid factor and ANA. -Patient verbalized understanding of the treatment plan.  She will return to clinic in approximately 3 weeks to discuss results.

## 2019-02-14 NOTE — Progress Notes (Signed)
[facility] Initial Visit:  Patient Care Team: Fayrene Helper, MD as PCP - General Rothbart, Cristopher Estimable, MD (Cardiology) Gala Romney Cristopher Estimable, MD as Consulting Physician (Gastroenterology)  CHIEF COMPLAINTS/PURPOSE OF CONSULTATION: -Hemochromatosis    HISTORY OF PRESENTING ILLNESS: Faith Hood 63 y.o. female presents today for consult regarding hemochromatosis.  She has a past medical history significant for hypertension and hyperlipidemia.  Patient admits to intermittent nausea over the past 6 months that has gradually gotten worse.  Nausea occurs with or without food.  She was evaluated by gastroenterology where she was worked up for celiac disease, which was negative.  She was also noted to have elevated liver enzymes, AST 64, and ALT 63.  Liver enzymes noted to be gradually increasing over the past year. Denies any abdominal pain.  No change in bowel habits.  Notes a 8 pound weight loss over the past 6 months.  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  Abdominal ultrasound and HIDA scan were negative for any acute findings.  Labs from December 2020 suggests a hemoglobin of 12.5, MCV of 86.3, ferritin level of 430, saturation percent of 23, TIBC 288, and iron of 67.  Hepatitis panel was negative.  She reports ongoing fatigue.  She does note her skin has a golden hue to it.  She denies any cardiac history.  She mentions at one time she was borderline diabetic she has since controlled this with her diet.  She does mention joint pain.  She states her father had elevated white blood cell counts and her mother died from pancreatic cancer.  Her last colonoscopy was in June 2020 which was negative for any acute findings.  She drinks alcohol socially and denies a history of smoking.   Review of Systems  Constitutional: Positive for fatigue.  HENT:  Negative.   Eyes:  Negative.   Respiratory: Negative.   Cardiovascular: Negative.   Gastrointestinal: Positive for nausea.  Endocrine: Negative.   Genitourinary: Negative.    Musculoskeletal: Positive for arthralgias.  Skin: Negative.   Neurological: Negative.   Hematological: Negative.   Psychiatric/Behavioral: Negative.     MEDICAL HISTORY: Past Medical History:  Diagnosis Date  . Cerebral vascular disease    Carotid ultrasound in 10/2011: Mild plaque; tortuous vessels; no definite luminal obstruction.  . Hepatic steatosis    By CT scan  . Hyperlipidemia   . Hypertension   . Overweight(278.02)   . Uterine leiomyoma    By CT scan    SURGICAL HISTORY: Past Surgical History:  Procedure Laterality Date  . COLONOSCOPY  12/2007   Negative screening study  . COLONOSCOPY N/A 08/17/2018   Procedure: COLONOSCOPY;  Surgeon: Daneil Dolin, MD;  Location: [facility];  Service: Endoscopy;  Laterality: N/A;  12:00-rescheduled to 4/17 same time per Almyra Free  . DIAGNOSTIC LAPAROSCOPY  1978   Gynecologic problems    SOCIAL HISTORY: Social History   Socioeconomic History  . Marital status: Single    Spouse name: Not on file  . Number of children: 0  . Years of education: Not on file  . Highest education level: Not on file  Occupational History  . 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 Smoking status: Never Smoker  . Smokeless tobacco: Never Used  Substance and Sexual Activity  . Alcohol use: Yes    Comment: occasionally  .  Drug use: No  . Sexual activity: Not Currently  Other Topics Concern  . Not on file  Social History Narrative  . Not on file   Social Determinants of Health   Financial Resource Strain:   . Difficulty of Paying Living Expenses: Not on file  Food Insecurity:   . Worried About Charity fundraiser in the Last Year: Not on file  . Ran Out of Food in the Last Year: Not on file  Transportation Needs:   . Lack of Transportation  (Medical): Not on file  . Lack of Transportation (Non-Medical): Not on file  Physical Activity:   . Days of Exercise per Week: Not on file  . Minutes of Exercise per Session: Not on file  Stress:   . Feeling of Stress : Not on file  Social Connections:   . Frequency of Communication with Friends and Family: Not on file  . Frequency of Social Gatherings with Friends and Family: Not on file  . Attends Religious Services: Not on file  . Active Member of Clubs or Organizations: Not on file  . Attends Archivist Meetings: Not on file  . Marital Status: Not on file  Intimate Partner Violence:   . Fear of Current or Ex-Partner: Not on file  . Emotionally Abused: Not on file  . Physically Abused: Not on file  . Sexually Abused: Not on file    FAMILY HISTORY Family History  Problem Relation Age of Onset  . Pancreatic cancer Mother 20       Diagnosed in 2009; currently in hospice pt. htn  . Hypertension Mother        + Sister x4  . Cancer Father 43       brain tumor  . Hypertension Sister   . Hypertension Sister   . Hypertension Sister   . Stroke Sister   . Hypertension Sister   . Cancer Maternal Grandmother   . Cancer Paternal Grandmother   . Colon cancer Neg Hx     ALLERGIES:  has No Known Allergies.  MEDICATIONS:  Current Outpatient Medications  Medication Sig Dispense Refill  . amLODipine (NORVASC) 10 MG tablet Take 1 tablet (10 mg total) by mouth daily. 90 tablet 3  . aspirin EC 81 MG tablet Take 81 mg by mouth daily.    Marland Kitchen losartan (COZAAR) 100 MG tablet Take 1 tablet (100 mg total) by mouth daily. 90 tablet 3  . pantoprazole (PROTONIX) 40 MG tablet Take 1 tablet (40 mg total) by mouth daily. 30 tablet 5  . ondansetron (ZOFRAN) 4 MG tablet Take 1 tablet (4 mg total) by mouth every 8 (eight) hours as needed for nausea or vomiting. (Patient not taking: Reported on 02/14/2019) 30 tablet 1   No current facility-administered medications for this visit.     PHYSICAL EXAMINATION:  ECOG PERFORMANCE STATUS: 0 - Asymptomatic   Vitals:   02/14/19 1306  BP: 136/80  Pulse: 89  Resp: 18  Temp: 97.9 F (36.6 C)  SpO2: 100%    Filed Weights   02/14/19 1306  Weight: 151 lb 1.6 oz (68.5 kg)     Physical Exam Constitutional:      Appearance: Normal appearance. She is obese.  HENT:     Head: Normocephalic.     Right Ear: External ear normal.     Left Ear: External ear normal.     Nose: Nose normal.  Eyes:     Conjunctiva/sclera: Conjunctivae normal.  Cardiovascular:  Rate and Rhythm: Normal rate and regular rhythm.     Pulses: Normal pulses.     Heart sounds: Normal heart sounds.  Pulmonary:     Effort: Pulmonary effort is normal.     Breath sounds: Normal breath sounds.  Abdominal:     General: Bowel sounds are normal.  Musculoskeletal:        General: Normal range of motion.     Cervical back: Normal range of motion.  Skin:    General: Skin is warm.  Neurological:     General: No focal deficit present.     Mental Status: She is alert and oriented to person, place, and time.  Psychiatric:        Mood and Affect: Mood normal.        Behavior: Behavior normal.      LABORATORY DATA: I have personally reviewed the data as listed:  No visits with results within 1 Month(s) from this visit.  Latest known visit with results is:  Office Visit on 01/02/2019  Component Date Value Ref Range Status  . Iron 01/25/2019 67  45 - 160 mcg/dL Final  . TIBC 01/25/2019 288  250 - 450 mcg/dL (calc) Final  . %SAT 01/25/2019 23  16 - 45 % (calc) Final  . Ferritin 01/25/2019 430* 16 - 288 ng/mL Final  . INR 01/25/2019 1.1   Final   Comment: Reference Range                     0.9-1.1 Moderate-intensity Warfarin Therapy 2.0-3.0 Higher-intensity Warfarin Therapy   3.0-4.0  .   Marland Kitchen Prothrombin Time 01/25/2019 11.2  9.0 - 11.5 sec Final   Comment: For additional information, please refer  to http://education.questdiagnostics.com/faq/FAQ104 (This link is being provided for informational/ educational purposes only.)   . WBC 01/25/2019 6.3  3.8 - 10.8 Thousand/uL Final  . RBC 01/25/2019 4.46  3.80 - 5.10 Million/uL Final  . Hemoglobin 01/25/2019 12.5  11.7 - 15.5 g/dL Final  . HCT 01/25/2019 38.5  35.0 - 45.0 % Final  . MCV 01/25/2019 86.3  80.0 - 100.0 fL Final  . MCH 01/25/2019 28.0  27.0 - 33.0 pg Final  . MCHC 01/25/2019 32.5  32.0 - 36.0 g/dL Final  . RDW 01/25/2019 14.3  11.0 - 15.0 % Final  . Platelets 01/25/2019 207  140 - 400 Thousand/uL Final  . MPV 01/25/2019 11.2  7.5 - 12.5 fL Final  . Neutro Abs 01/25/2019 4,649  1,500 - 7,800 cells/uL Final  . Lymphs Abs 01/25/2019 1,128  850 - 3,900 cells/uL Final  . Absolute Monocytes 01/25/2019 183* 200 - 950 cells/uL Final  . Eosinophils Absolute 01/25/2019 321  15 - 500 cells/uL Final  . Basophils Absolute 01/25/2019 19  0 - 200 cells/uL Final  . Neutrophils Relative % 01/25/2019 73.8  % Final  . Total Lymphocyte 01/25/2019 17.9  % Final  . Monocytes Relative 01/25/2019 2.9  % Final  . Eosinophils Relative 01/25/2019 5.1  % Final  . Basophils Relative 01/25/2019 0.3  % Final  . Total Protein 01/25/2019 7.3  6.1 - 8.1 g/dL Final  . Albumin 01/25/2019 4.1  3.6 - 5.1 g/dL Final  . Globulin 01/25/2019 3.2  1.9 - 3.7 g/dL (calc) Final  . AG Ratio 01/25/2019 1.3  1.0 - 2.5 (calc) Final  . Total Bilirubin 01/25/2019 0.8  0.2 - 1.2 mg/dL Final  . Bilirubin, Direct 01/25/2019 0.2  0.0 - 0.2 mg/dL Final  . Indirect Bilirubin  01/25/2019 0.6  0.2 - 1.2 mg/dL (calc) Final  . Alkaline phosphatase (APISO) 01/25/2019 91  37 - 153 U/L Final  . AST 01/25/2019 66* 10 - 35 U/L Final  . ALT 01/25/2019 60* 6 - 29 U/L Final  . Immunoglobulin A 01/25/2019 421* 70 - 320 mg/dL Final  . (tTG) Ab, IgG 01/25/2019 1  U/mL Final   Comment: .        Value      Interpretation        -----      --------------        <6         No Antibody  Detected        > or = 6   Antibody Detected .   . (tTG) Ab, IgA 01/25/2019 1  U/mL Final   Comment: .        Value      Interpretation        -----      --------------        <4         No Antibody Detected        > or = 4   Antibody Detected .   Marland Kitchen DNA MUTATION ANALYSIS 01/25/2019 SEE NOTE   Final   Comment: RESULT: POSITIVE FOR ONE HFE GENE PATHOGENIC VARIANT: H63D (HETEROZYGOTE) . Interpretation: One copy of the H63D pathogenic variant in the HFE gene was detected. This patient is negative for the C282Y pathogenic variant. In the absence of evidence of iron overload, this result most likely indicates that this individual is an HFE carrier. This result reduces the likelihood of hereditary hemochromatosis ([place]). However, it does not rule out the presence of other pathogenic variants within the HFE gene or a diagnosis of HH. The risk of this individual to carry an HFE pathogenic variant other than those tested in this assay depends greatly on family and clinical history as well as ethnicity. This assay does not test for other primary or secondary iron overload disorders. Consider genetic counseling and DNA testing for at-risk family members. . Laboratory results and submitted clinical information reviewed by Gerilyn Nestle, MD, PhD, [facility] CGMBS. Marland Kitchen DETAILED ASSAY INFORMATION                          : Hereditary hemochromatosis (HH) is an autosomal recessive disorder of iron metabolism that can result in iron overload and potential organ failure. It is one of the most common genetic disorders in individuals of European-Caucasian ancestry, with an estimated carrier frequency of 10%. HH is caused by pathogenic variants in the HFE gene. Most individuals with HH (60-90%) are homozygous for the C282Y pathogenic variant. A smaller percentage of affected individuals are either compound heterozygous for the C282Y and H63D pathogenic variants (3%-8%), or homozygous for the H63D  pathogenic variant (approximately 1%). . METHODOLOGY: This assay detects two pathogenic variants in the HFE gene, C282Y (NM [id number]: c.845G>A, p.Cys282Tyr) and H63D (NM [id number]: c.187C>G, p.His63Asp), that are commonly associated with HH. These variants are detected by multiplex-polymerase chain reaction (PCR) amplification, followed by restriction enzyme digestion and capillary electrophoresis.                           Marland Kitchen LIMITATIONS: This assay does not detect other pathogenic variants in the HFE gene that may be associated with HH. Although rare, false positive or false negative results may occur.  All results should be interpreted in the context of clinical findings, relevant history, and other laboratory data. . Health care providers, please contact your local [facility]' genetic counselor or call [phone] for assistance with the interpretation of these results. . This test was developed and its analytical performance characteristics have been determined by [facility]. It has not been cleared or approved by FDA. This assay has been validated pursuant to the CLIA regulations and is used for clinical purposes. . For more information, please refer to http://education.questdiagnostics.com/faq/hemochromatos (This link is being provided for informational/educational purposes only.)   . TEST NAME: 01/25/2019 HEREDITARY   Final  . TEST CODE: 01/25/2019 [id number]   Final  . CLIENT CONTACT: [id number] ALICIA MOORE   Final  . REPORT ALWAYS MESSAGE SIGNATURE 01/25/2019    Final   Comment: . The laboratory testing on this patient was verbally requested or confirmed by the ordering physician or his or her authorized representative after contact with an employee of [employer]. Federal regulations require that we maintain on file written authorization for all laboratory testing.  Accordingly we are  asking that the ordering physician or his or her authorized representative sign a copy of this report and promptly return it to the client service representative. . . Signature:____________________________________________________ . Please fax this signed page to [phone] or return it via your [employer] courier.       No results found.  ASSESSMENT/PLAN   Hemochromatosis 1. Hemachromatosis - Elevated liver enzymes noted first noted in 01/18/2017: AST: 41; ALT: 37 - 12/26/18: Abdominal ultrasound negative for any acute findings - 01/24/19: HIDA scan: Negative for any acute findings. - Liver enzymes continued to trend up, most recent, 01/25/19: AST: 66, ALT: 60 - 01/25/19: Labs: Hemoglobin 12.5, MCV 86.3, ferritin 430, iron 67, TIBC 288, saturation 23%.   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 -Discussed lab findings with patient.  Heterozygous HFE gene mutations are typically not associated with iron overload.  She will need a MRI of the liver to better assess liver iron concentration.  We will also plan to evaluate elevated liver enzymes with MRI and complete additional lab studies including hepatitis C, rheumatoid factor and ANA. -Patient verbalized understanding of the treatment plan.  She will return to clinic in approximately 3 weeks to discuss results.    Addendum: -I have independently interviewed and examined the patient.  I agree with assessment and plan written by my nurse practitioner Carver Fila, FNP.  Patient being evaluated for elevated LFTs, found to have heterozygosity for H63D mutation.  Ultrasound of the liver did not show any fatty infiltration.  Hepatitis B panel was negative.  No family history of hemochromatosis.  No prior transfusion history.  Would recommend MRI of the liver to know the liver iron concentration.  We will also check hepatitis C and connective tissue disorders.  Orders Placed This Encounter   Procedures  . MR LIVER W WO CONTRAST    Standing Status:   Future    Standing Expiration Date:   04/16/2020    Order Specific Question:   ** REASON FOR EXAM (FREE TEXT)    Answer:   Hemachromatosis; liver iron concentration    Order Specific Question:   If indicated for the ordered procedure, I authorize the administration of contrast media per Radiology protocol    Answer:   Yes    Order Specific Question:   What is the patient's sedation  requirement?    Answer:   No Sedation    Order Specific Question:   Does the patient have a pacemaker or implanted devices?    Answer:   No    Order Specific Question:   Radiology Contrast Protocol - do NOT remove file path    Answer:   [email or web address]    Order Specific Question:   Preferred imaging location?    Answer:   [facility] (table limit-350lbs)  . Hepatitis C Antibody  . Rheumatoid factor    Standing Status:   Future    Number of Occurrences:   1    Standing Expiration Date:   02/14/2020  . ANA, IFA (with reflex)    All questions were answered. The patient knows to call the clinic with any problems, questions or concerns.  This note was electronically signed.    Roger Shelter, FNP  02/14/2019 2:48 PM

## 2019-02-15 LAB — RHEUMATOID FACTOR: Rheumatoid fact SerPl-aCnc: <10 IU/mL (ref 0.0–13.9)

## 2019-02-16 LAB — ANTINUCLEAR ANTIBODIES, IFA: ANA Ab, IFA: NEGATIVE

## 2019-02-23 ENCOUNTER — Other Ambulatory Visit: Payer: Self-pay

## 2019-02-23 ENCOUNTER — Ambulatory Visit (HOSPITAL_COMMUNITY)
Admission: RE | Admit: 2019-02-23 | Discharge: 2019-02-23 | Disposition: A | Discharge status: Home / Self Care | Payer: BC Managed Care – PPO | Source: Ambulatory Visit | Attending: Family Medicine | Admitting: Family Medicine

## 2019-02-23 DIAGNOSIS — Z1231 Encounter for screening mammogram for malignant neoplasm of breast: Secondary | ICD-10-CM | POA: Insufficient documentation

## 2019-02-24 DIAGNOSIS — E038 Other specified hypothyroidism: Secondary | ICD-10-CM

## 2019-02-24 DIAGNOSIS — E063 Autoimmune thyroiditis: Secondary | ICD-10-CM

## 2019-02-24 DIAGNOSIS — M300 Polyarteritis nodosa: Secondary | ICD-10-CM

## 2019-02-24 HISTORY — DX: Polyarteritis nodosa: M30.0

## 2019-02-24 HISTORY — DX: Autoimmune thyroiditis: E06.3

## 2019-02-24 HISTORY — DX: Other specified hypothyroidism: E03.8

## 2019-03-03 ENCOUNTER — Ambulatory Visit (HOSPITAL_COMMUNITY): Payer: BC Managed Care – PPO

## 2019-03-06 ENCOUNTER — Ambulatory Visit: Payer: BC Managed Care – PPO | Admitting: Gastroenterology

## 2019-03-07 ENCOUNTER — Ambulatory Visit (HOSPITAL_COMMUNITY)
Admission: RE | Admit: 2019-03-07 | Discharge: 2019-03-07 | Disposition: A | Discharge status: Home / Self Care | Payer: BC Managed Care – PPO | Source: Ambulatory Visit | Attending: Hematology | Admitting: Hematology

## 2019-03-07 ENCOUNTER — Other Ambulatory Visit: Payer: Self-pay

## 2019-03-07 DIAGNOSIS — R7401 Elevation of levels of liver transaminase levels: Secondary | ICD-10-CM | POA: Diagnosis present

## 2019-03-07 DIAGNOSIS — R7989 Other specified abnormal findings of blood chemistry: Secondary | ICD-10-CM | POA: Diagnosis present

## 2019-03-07 LAB — POCT I-STAT CREATININE: Creatinine, Ser: 0.8 mg/dL (ref 0.44–1.00)

## 2019-03-07 MED ORDER — GADOBUTROL 1 MMOL/ML IV SOLN
7.0000 mL | Freq: Once | INTRAVENOUS | Status: AC | PRN
  Administered 2019-03-07: 7 mL via INTRAVENOUS

## 2019-03-08 ENCOUNTER — Other Ambulatory Visit: Payer: Self-pay

## 2019-03-09 ENCOUNTER — Inpatient Hospital Stay (HOSPITAL_COMMUNITY): Payer: BC Managed Care – PPO | Attending: Hematology | Admitting: Hematology

## 2019-03-09 ENCOUNTER — Encounter (HOSPITAL_COMMUNITY): Payer: Self-pay | Admitting: Hematology

## 2019-03-09 ENCOUNTER — Inpatient Hospital Stay (HOSPITAL_COMMUNITY): Payer: BC Managed Care – PPO

## 2019-03-09 VITALS — BP 140/78 | HR 87 | Temp 97.4°F | Resp 18 | Wt 152.7 lb

## 2019-03-09 DIAGNOSIS — Z7982 Long term (current) use of aspirin: Secondary | ICD-10-CM | POA: Insufficient documentation

## 2019-03-09 DIAGNOSIS — Z79899 Other long term (current) drug therapy: Secondary | ICD-10-CM | POA: Diagnosis not present

## 2019-03-09 DIAGNOSIS — E785 Hyperlipidemia, unspecified: Secondary | ICD-10-CM | POA: Diagnosis not present

## 2019-03-09 DIAGNOSIS — R7989 Other specified abnormal findings of blood chemistry: Secondary | ICD-10-CM | POA: Diagnosis not present

## 2019-03-09 DIAGNOSIS — I10 Essential (primary) hypertension: Secondary | ICD-10-CM | POA: Insufficient documentation

## 2019-03-09 DIAGNOSIS — K760 Fatty (change of) liver, not elsewhere classified: Secondary | ICD-10-CM | POA: Diagnosis not present

## 2019-03-09 LAB — HEPATIC FUNCTION PANEL
ALT: 55 U/L — ABNORMAL HIGH (ref 0–44)
AST: 68 U/L — ABNORMAL HIGH (ref 15–41)
Albumin: 3.9 g/dL (ref 3.5–5.0)
Alkaline Phosphatase: 99 U/L (ref 38–126)
Bilirubin, Direct: 0.1 mg/dL (ref 0.0–0.2)
Indirect Bilirubin: 0.6 mg/dL (ref 0.3–0.9)
Total Bilirubin: 0.7 mg/dL (ref 0.3–1.2)
Total Protein: 8 g/dL (ref 6.5–8.1)

## 2019-03-09 LAB — IRON AND TIBC
Iron: 62 ug/dL (ref 28–170)
Saturation Ratios: 18 % (ref 10.4–31.8)
TIBC: 335 ug/dL (ref 250–450)
UIBC: 273 ug/dL

## 2019-03-09 LAB — FERRITIN: Ferritin: 283 ng/mL (ref 11–307)

## 2019-03-09 NOTE — Assessment & Plan Note (Signed)
1.  Heterozygous H63D mutation: -Patient had elevated liver enzymes since November 2018, with gradual worsening. -Abdominal ultrasound on 12/26/2018 did not show any acute findings. -Hemochromatosis testing showed heterozygosity for H63D. -Ferritin on 01/25/2019 was elevated at 430 with percent saturation of 23. -I have reviewed her MRI of the liver independently and discussed with the patient.  No evidence of iron deposition on MR.  Liver was normal. -We have repeated ferritin today which came down to 83 with percent saturation of 18. -This patient does not have any evidence of hemochromatosis related liver disease.  She is not on any medication that can increase liver enzymes.  Does not take any over-the-counter herbal supplements. -Follow-up with gastroenterology and possible liver biopsy if there is no etiology identified. -We will see her back in 6 months with repeat LFTs and iron panel and ferritin.

## 2019-03-09 NOTE — Patient Instructions (Signed)
[place] at [facility] Discharge Instructions  You were seen today by Dr. Delton Coombes. He went over your recent lab results. He will have blood drawn today before you leave. He will see you back in for labs and follow up.   Thank you for choosing [place] at [facility] to provide your oncology and hematology care.  To afford each patient quality time with our provider, please arrive at least 15 minutes before your scheduled appointment time.   If you have a lab appointment with the [facility] please come in thru the  Main Entrance and check in at the main information desk  You need to re-schedule your appointment should you arrive 10 or more minutes late.  We strive to give you quality time with our providers, and arriving late affects you and other patients whose appointments are after yours.  Also, if you no show three or more times for appointments you may be dismissed from the clinic at the providers discretion.     Again, thank you for choosing [facility].  Our hope is that these requests will decrease the amount of time that you wait before being seen by our physicians.       _____________________________________________________________  Should you have questions after your visit to [facility], please contact our office at [phone] between the hours of 8:00 a.m. and 4:30 p.m.  Voicemails left after 4:00 p.m. will not be returned until the following business day.  For prescription refill requests, have your pharmacy contact our office and allow 72 hours.    Cancer Center Support Programs:   > Cancer Support Group  2nd Tuesday of the month 1pm-2pm, Journey Room

## 2019-03-09 NOTE — Progress Notes (Signed)
[facility], [place] [address]   CLINIC:  Medical Oncology/Hematology  PCP:  Fayrene Helper, MD [address], [place] [address] [phone]   REASON FOR VISIT:  Follow-up for heterozygosity for hemochromatosis gene.  CURRENT THERAPY: Observation.    INTERVAL HISTORY:  Ms. Payne 64 y.o. female seen for follow-up after MRI and blood work.  She was initially evaluated by [place] for heterozygous H63D mutation  in the setting of elevated ferritin.  Work-up was initiated secondary to elevated AST and ALT.  She denies taking any over-the-counter medications.  No joint pains or bronze pigmentation of the skin.  Appetite and energy levels are 75 and 50% respectively.  Occasional nausea present.  Mild fatigue present.    REVIEW OF SYSTEMS:  Review of Systems  Constitutional: Positive for fatigue.  Cardiovascular: Positive for leg swelling.  Gastrointestinal: Positive for nausea.  Neurological: Positive for numbness.  All other systems reviewed and are negative.    PAST MEDICAL/SURGICAL HISTORY:  Past Medical History:  Diagnosis Date  . Cerebral vascular disease    Carotid ultrasound in 10/2011: Mild plaque; tortuous vessels; no definite luminal obstruction.  . Hepatic steatosis    By CT scan  . Hyperlipidemia   . Hypertension   . Overweight(278.02)   . Uterine leiomyoma    By CT scan   Past Surgical History:  Procedure Laterality Date  . COLONOSCOPY  12/2007   Negative screening study  . COLONOSCOPY N/A 08/17/2018   Procedure: COLONOSCOPY;  Surgeon: Daneil Dolin, MD;  [facility]: [facility];  Service: Endoscopy;  Laterality: N/A;  12:00-rescheduled to 4/17 same time per Almyra Free  . DIAGNOSTIC LAPAROSCOPY  1978   Gynecologic problems     SOCIAL HISTORY:  Social History   Socioeconomic History  . Marital status: Single    Spouse name: Not on file  . Number of children: 0  . Years of education: Not on file  .  Highest education level: Not on file  Occupational History  . 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 Smoking status: Never Smoker  . Smokeless tobacco: Never Used  Substance and Sexual Activity  . Alcohol use: Yes    Comment: occasionally  . Drug use: No  . Sexual activity: Not Currently  Other Topics Concern  . Not on file  Social History Narrative  . Not on file   Social Determinants of Health   Financial Resource Strain:   . Difficulty of Paying Living Expenses: Not on file  Food Insecurity:   . Worried About Charity fundraiser in the Last Year: Not on file  . Ran Out of Food in the Last Year: Not on file  Transportation Needs:   . Lack of Transportation (Medical): Not on file  . Lack of Transportation (Non-Medical): Not on file  Physical Activity:   . Days of Exercise per Week: Not on file  . Minutes of Exercise per Session: Not on file  Stress:   . Feeling of Stress : Not on file  Social Connections:   . Frequency of Communication with Friends and Family: Not on file  . Frequency of Social Gatherings with Friends and Family: Not on file  . Attends Religious Services: Not on file  . Active Member of Clubs or Organizations: Not on file  . Attends Archivist Meetings: Not on file  . Marital Status: Not on  file  Intimate Partner Violence:   . Fear of Current or Ex-Partner: Not on file  . Emotionally Abused: Not on file  . Physically Abused: Not on file  . Sexually Abused: Not on file    FAMILY HISTORY:  Family History  Problem Relation Age of Onset  . Pancreatic cancer Mother 60       Diagnosed in 2009; currently in hospice pt. htn  . Hypertension Mother        + Sister x4  . Cancer Father 81       brain tumor  . Hypertension Sister   . Hypertension Sister   . Hypertension Sister   . Stroke Sister   . Hypertension Sister   . Cancer Maternal Grandmother   . Cancer Paternal Grandmother   . Colon cancer  Neg Hx     CURRENT MEDICATIONS:  Outpatient Encounter Medications as of 03/09/2019  Medication Sig  . amLODipine (NORVASC) 10 MG tablet Take 1 tablet (10 mg total) by mouth daily.  Marland Kitchen aspirin EC 81 MG tablet Take 81 mg by mouth daily.  Marland Kitchen losartan (COZAAR) 100 MG tablet Take 1 tablet (100 mg total) by mouth daily.  . [DISCONTINUED] pantoprazole (PROTONIX) 40 MG tablet Take 1 tablet (40 mg total) by mouth daily.  . [DISCONTINUED] ondansetron (ZOFRAN) 4 MG tablet Take 1 tablet (4 mg total) by mouth every 8 (eight) hours as needed for nausea or vomiting.   No facility-administered encounter medications on file as of 03/09/2019.    ALLERGIES:  No Known Allergies   PHYSICAL EXAM:  ECOG Performance status: 1  Vitals:   03/09/19 1155  BP: 140/78  Pulse: 87  Resp: 18  Temp: (!) 97.4 F (36.3 C)  SpO2: 99%   Filed Weights   03/09/19 1155  Weight: 152 lb 11.2 oz (69.3 kg)    Physical Exam Vitals reviewed.  Constitutional:      Appearance: Normal appearance.  Cardiovascular:     Rate and Rhythm: Normal rate and regular rhythm.     Heart sounds: Normal heart sounds.  Pulmonary:     Breath sounds: Normal breath sounds.  Abdominal:     Palpations: Abdomen is soft.  Neurological:     General: No focal deficit present.     Mental Status: She is alert and oriented to person, place, and time.  Psychiatric:        Mood and Affect: Mood normal.        Behavior: Behavior normal.      LABORATORY DATA:  I have reviewed the labs as listed.  CBC    Component Value Date/Time   WBC 6.3 01/25/2019 0851   RBC 4.46 01/25/2019 0851   HGB 12.5 01/25/2019 0851   HCT 38.5 01/25/2019 0851   PLT 207 01/25/2019 0851   MCV 86.3 01/25/2019 0851   MCH 28.0 01/25/2019 0851   MCHC 32.5 01/25/2019 0851   RDW 14.3 01/25/2019 0851   LYMPHSABS 1,128 01/25/2019 0851   MONOABS 0.3 03/31/2014 0913   EOSABS 321 01/25/2019 0851   BASOSABS 19 01/25/2019 0851   CMP Latest Ref Rng & Units 03/09/2019  03/07/2019 01/25/2019  Glucose 65 - 99 mg/dL - - -  BUN 7 - 25 mg/dL - - -  Creatinine 0.44 - 1.00 mg/dL - 0.80 -  Sodium 135 - 146 mmol/L - - -  Potassium 3.5 - 5.3 mmol/L - - -  Chloride 98 - 110 mmol/L - - -  CO2 20 - 32 mmol/L - - -  Calcium 8.6 - 10.4 mg/dL - - -  Total Protein 6.5 - 8.1 g/dL 8.0 - 7.3  Total Bilirubin 0.3 - 1.2 mg/dL 0.7 - 0.8  Alkaline Phos 38 - 126 U/L 99 - -  AST 15 - 41 U/L 68(H) - 66(H)  ALT 0 - 44 U/L 55(H) - 60(H)       DIAGNOSTIC IMAGING:  I have independently reviewed the scans and discussed with the patient.     ASSESSMENT & PLAN:   Hemochromatosis 1.  Heterozygous H63D mutation: -Patient had elevated liver enzymes since November 2018, with gradual worsening. -Abdominal ultrasound on 12/26/2018 did not show any acute findings. -Hemochromatosis testing showed heterozygosity for H63D. -Ferritin on 01/25/2019 was elevated at 430 with percent saturation of 23. -I have reviewed her MRI of the liver independently and discussed with the patient.  No evidence of iron deposition on MR.  Liver was normal. -We have repeated ferritin today which came down to 83 with percent saturation of 18. -This patient does not have any evidence of hemochromatosis related liver disease.  She is not on any medication that can increase liver enzymes.  Does not take any over-the-counter herbal supplements. -Follow-up with gastroenterology and possible liver biopsy if there is no etiology identified. -We will see her back in 6 months with repeat LFTs and iron panel and ferritin.      Orders placed this encounter:  Orders Placed This Encounter  Procedures  . Iron and TIBC  . Ferritin  . Hepatic function panel      Derek Jack, MD [place] [phone]

## 2019-03-13 ENCOUNTER — Other Ambulatory Visit (HOSPITAL_COMMUNITY): Payer: Self-pay | Admitting: *Deleted

## 2019-03-13 DIAGNOSIS — R7989 Other specified abnormal findings of blood chemistry: Secondary | ICD-10-CM

## 2019-03-23 NOTE — Progress Notes (Signed)
Referring Provider: Fayrene Helper, MD Primary Care Physician:  Fayrene Helper, MD Primary GI Physician: Dr. Gala Romney  Chief Complaint  Patient presents with  . Follow-up    HPI:   Faith Hood is a 64 y.o. female with a GI history significant for fatty liver noted on CT in 2009. TCS up to date in June 2020, due for repeat in 2030.  Presents today for follow-up of elevated LFTs and nausea without vomiting.  Last seen in our office on 01/02/2019 for initial consult. AST and ALT mildly elevated in the 30-40 range since November 2018 but increased to the 60 range in October 2020.  Acute hepatitis panel negative.  Ultrasound on 12/26/2018 within normal limits.  No family history of liver disease or autoimmune conditions other than sister with thyroid disorder.  Denied alcohol or drug use.  Herbal teas occasionally.  No signs or symptoms of advanced liver disease although she did report intermittent swelling in lower extremities. She did report loss of appetite due to daily nausea without vomiting x5 months with 8 lb weight loss.  Significantly worsened with sugar or breads. Denied GERD symptoms, abdominal pain, or dysphagia.  Family history significant for mother with history of pancreatic cancer.  Query gallbladder etiology versus celiac disease.  Would start PPI empirically and Zofran, pursue HIDA, check celiac serologies, update CBC, HFP, iron panel, and INR.  Would have low threshold for CT in the setting of elevated LFTs, nausea, and family history of pancreatic cancer.  HIDA on 01/24/2019 normal.  Patient reported she was still having nausea daily but it was not constant.  She is not taking Protonix daily due to feeling dizzy and sleepy after taking it.  Nexium was sent in for patient to try.  Labs completed on 01/25/2019. LFT elevation stable with AST 66 and ALT 60, HFP otherwise normal.  CBC within normal limits.  INR normal.  No evidence of celiac disease.  Iron panel with ferritin 430H,  otherwise normal.  Hemochromatosis labs completed revealing H63D heterozygosity.  He was referred to hematology for further evaluation.  Office visit with oncology on 02/14/2019.  Evaluation included negative Hepatitis C antibody, negative   ANA, negative rheumatoid factor.  MRI with unremarkable liver, gallbladder, spleen, pancreas, adrenals, kidneys, stomach.  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  Repeat labs on 03/09/2019 with ferritin back within normal limits.  HFP with AST 68, ALT 55.  Today:  States she is doing much better. Appetite slowly returned. States she is eating a lot more. Cleaned her diet up and started eating healthier.  Limiting fried, fatty foods, foods high in sugar, and meats. Nausea is much improved. If she has nausea, it is around the time of a BM.  Not taking Protonix or Nexium. Never tried Nexium because symptom were starting to improve. No vomiting. No GERD symptoms or dysphagia. No abdominal pain. BMs 2-3 times a week. Depends on what she eats. When eating plenty of greens, this helps. Sometimes stools will be hard. Occasional straining. Majority of the time, stools are soft and formed without straining. No blood in the stool. No black stools.   No swelling in her abdomen. Intermittent swelling her ankles. No yellowing of eyes or dark urine. No confusion. No known autoimmune conditions. Sister had thyroid removed, not sure why. No family history of liver disease. No tylenol.mAleve once every month or two.  Rare alcohol, about twice  a year. No OTC supplements or herbal teas.   States she has some spots on her arms and thighs. Sometimes they are itchy. Sometimes they go away but more appear. Started in July 2020 around the same time she was having her stomach trouble. No known tick bite. Had not been out in the woods.  No change in laundry detergent.  No fever, chills. No cold or flu like symptoms. No pre-syncope or  syncope. No chest pain or heart palpitations. No shortness of breath or coughing.    Past Medical History:  Diagnosis Date  . Cerebral vascular disease    Carotid ultrasound in 10/2011: Mild plaque; tortuous vessels; no definite luminal obstruction.  . Hepatic steatosis    By CT scan  . Hyperlipidemia   . Hypertension   . Overweight(278.02)   . Uterine leiomyoma    By CT scan    Past Surgical History:  Procedure Laterality Date  . COLONOSCOPY  12/2007   Negative screening study  . COLONOSCOPY N/A 08/17/2018   Procedure: COLONOSCOPY;  Surgeon: Daneil Dolin, MD;  Location: [facility];  Service: Endoscopy;  Laterality: N/A;  12:00-rescheduled to 4/17 same time per Almyra Free  . DIAGNOSTIC LAPAROSCOPY  1978   Gynecologic problems    Current Outpatient Medications  Medication Sig Dispense Refill  . amLODipine (NORVASC) 10 MG tablet Take 1 tablet (10 mg total) by mouth daily. 90 tablet 3  . aspirin EC 81 MG tablet Take 81 mg by mouth daily.    Marland Kitchen losartan (COZAAR) 100 MG tablet Take 1 tablet (100 mg total) by mouth daily. 90 tablet 3   No current facility-administered medications for this visit.    Allergies as of 03/24/2019  . (No Known Allergies)    Family History  Problem Relation Age of Onset  . Pancreatic cancer Mother 33       Diagnosed in 2009; currently in hospice pt. htn  . Hypertension Mother        + Sister x4  . Cancer Father 61       brain tumor  . Hypertension Sister   . Hypertension Sister   . Hypertension Sister   . Stroke Sister   . Hypertension Sister   . Cancer Maternal Grandmother   . Cancer Paternal Grandmother   . Colon cancer Neg Hx     Social History   Socioeconomic History  . Marital status: Single    Spouse name: Not on file  . Number of children: 0  . Years of education: Not on file  . Highest education level: Not on file  Occupational History  . 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  Smoking status: Never Smoker  . Smokeless tobacco: Never Used  Substance and Sexual Activity  . Alcohol use: Yes    Comment: occasionally. About twice a year.   . Drug use: No  . Sexual activity: Not Currently  Other Topics Concern  . Not on file  Social History Narrative  . Not on file   Social Determinants of Health   Financial Resource Strain:   . Difficulty of Paying Living Expenses: Not on file  Food Insecurity:   . Worried About Charity fundraiser in the Last Year: Not on file  . Ran Out of Food in the Last Year: Not on file  Transportation Needs:   . Lack of Transportation (Medical): Not on file  . Lack of Transportation (Non-Medical): Not  on file  Physical Activity:   . Days of Exercise per Week: Not on file  . Minutes of Exercise per Session: Not on file  Stress:   . Feeling of Stress : Not on file  Social Connections:   . Frequency of Communication with Friends and Family: Not on file  . Frequency of Social Gatherings with Friends and Family: Not on file  . Attends Religious Services: Not on file  . Active Member of Clubs or Organizations: Not on file  . Attends Archivist Meetings: Not on file  . Marital Status: Not on file    Review of Systems: Gen: See HPI CV: See HPI Resp: See HPI GI: See HPI Derm: See HPI Psych: Denies depression or anxiety.  Heme: See HPI  Physical Exam: BP 136/78   Pulse 95   Temp (!) 96.2 F (35.7 C) (Temporal)   Ht 5\' 3"  (1.6 m)   Wt 153 lb 12.8 oz (69.8 kg)   BMI 27.24 kg/m  General:   Alert and oriented. No distress noted. Pleasant and cooperative.  Head:  Normocephalic and atraumatic. Eyes:  Conjuctiva clear without scleral icterus. Heart:  S1, S2 present without murmurs appreciated. Lungs:  Clear to auscultation bilaterally. No wheezes, rales, or rhonchi. No distress.  Abdomen:  +BS, soft, non-tender and non-distended. No rebound or guarding. No HSM or masses noted. Msk:  Symmetrical without gross  deformities. Normal posture. Extremities:  Without edema. Skin: Dime to quarter sized lesions on legs and arms with increased pigmentation, minimal erythematous or lesions, no associated scaling.  Some are raised scaly others are flat.  Nontender to palpation. Neurologic:  Alert and oriented x4 Psych: Normal mood and affect.

## 2019-03-24 ENCOUNTER — Other Ambulatory Visit: Payer: Self-pay

## 2019-03-24 ENCOUNTER — Encounter: Payer: Self-pay | Admitting: Internal Medicine

## 2019-03-24 ENCOUNTER — Encounter: Payer: Self-pay | Admitting: Gastroenterology

## 2019-03-24 ENCOUNTER — Ambulatory Visit (INDEPENDENT_AMBULATORY_CARE_PROVIDER_SITE_OTHER): Payer: BC Managed Care – PPO | Admitting: Gastroenterology

## 2019-03-24 VITALS — BP 136/78 | HR 95 | Temp 96.2°F | Ht 63.0 in | Wt 153.8 lb

## 2019-03-24 DIAGNOSIS — R7989 Other specified abnormal findings of blood chemistry: Secondary | ICD-10-CM

## 2019-03-24 DIAGNOSIS — L989 Disorder of the skin and subcutaneous tissue, unspecified: Secondary | ICD-10-CM | POA: Diagnosis not present

## 2019-03-24 DIAGNOSIS — R110 Nausea: Secondary | ICD-10-CM

## 2019-03-24 DIAGNOSIS — R194 Change in bowel habit: Secondary | ICD-10-CM

## 2019-03-24 NOTE — Patient Instructions (Addendum)
Please have labs completed.   Continue avoiding fried, fatty, greasy, spicy foods. Avoid foods high in sugar. Eat lean meats including poultry or fishy. Avoid carbonated beverages and caffeine.   Add benefiber or metamucil daily.   You may also try MiraLAX 1 capful (17g) daily in 8 oz of water to help with bowel regularity. If you develop loose stools, you can decrease frequency.   Please follow-up with PCP regarding spots on your legs and arms.   I am glad you are feeling much better!  We will plan to follow-up with you in 6 months. We will call you with lab results and further recommendations. Call if you have questions or concerns.   Aliene Altes, PA-C [facility]

## 2019-03-25 DIAGNOSIS — L989 Disorder of the skin and subcutaneous tissue, unspecified: Secondary | ICD-10-CM | POA: Insufficient documentation

## 2019-03-25 DIAGNOSIS — M300 Polyarteritis nodosa: Secondary | ICD-10-CM | POA: Insufficient documentation

## 2019-03-25 NOTE — Assessment & Plan Note (Addendum)
Nausea is much improved and her appetite has returned since changing her diet.  Following a healthier diet, avoiding fried, fatty foods, sugar, and eating small amounts of meat.  Eating more fruits and vegetables. I had tried her on Protonix which caused dizziness/tiredness. Prescribed Nexium, but she didn't start this as she was already feeling better. If she does have nausea, it is prior to a BM.  No vomiting.  No abdominal pain.  She does have what sounds like mild underlying constipation as she has 2-3 BMs a week.  No alarm symptoms.  Weight is stable/up 1 pound since I saw her in November 2020.   Continue to monitor symptoms. Add Benefiber or Metamucil daily to help with bowel regularity. Discussed adding MiraLAX 1 capful (17 g) in 8 ounces of water daily.  She will decrease this if she develops frequent loose stools. Follow-up in 6 months.

## 2019-03-25 NOTE — Assessment & Plan Note (Signed)
Patient has dime to quarter sized lesions on her legs and arms with increase in pigmentation. Minimal erythema of some lesions. Doesn't appear to have any associated scaling. Some are raised from the skin and some are flat.  Non-tender. Patient notes these lesions come and go.  Mild itching at times.  Noticed they first appeared back in the summer when she also started having nausea and decreased appetite.  Nausea has essentially resolved and appetite has returned.  She denies any known tick bite.  Has not been out in the woods.  Has not changed her laundry detergent. Using topical anti-itch creams as needed which helps.   Unclear etiology. She was advised to follow-up with PCP on this.

## 2019-03-25 NOTE — Assessment & Plan Note (Signed)
Suspect she has some mild underlying constipation as she has 2-3 BMs a week.  Occasional hard stools or straining.  Occasional nausea prior to BMs.  No alarm symptoms.  Colonoscopy up-to-date in June 2020, due for repeat in 2030.  Add Benefiber or Metamucil daily. Add MiraLAX 1 capful (17 g) daily in 8 ounces of water.  Decrease frequency if loose stools develop. Follow-up in 6 months.

## 2019-03-25 NOTE — Assessment & Plan Note (Signed)
64 year old female with past medical history of HTN, HLD, fatty liver on CT in 2009, and mildly elevated LFTs in the 30-40 range since November 2018 that increased to the 60 range in October 2020.   Most recent labs with AST 68, ALT 55.  Alk phos and total bilirubin normal.  Work-up thus far for elevated LFTs has included CBC with normal platelets, negative acute hepatitis panel, ultrasound on 12/26/2018 within normal limits, INR normal, celiac screen negative, iron panel with ferritin 430H.  Hemochromatosis labs revealing H63D heterozygosity.  She has been seen by hematology with ANA and rheumatoid factor negative.  MRI with unremarkable liver.  Repeat iron panel on 03/09/2019 with ferritin back within normal limits.  She is without any signs or symptoms of advanced liver disease.  No Tylenol or OTC supplements.  No recent alcohol use.   No clear explanation of elevated LFTs and liver appearing unremarkable on MRI, will complete additional laboratory work-up to evaluate for autoimmune causes, alpha-1 antitrypsin deficiency, and Wilson's disease.  If labs are unremarkable, would need to consider liver biopsy if LFTs remain elevated.   Complete alpha-1 antitrypsin phenotype, ceruloplasmin, AMA, ASMA, immunoglobulins Check hepatitis B surface antibody for immunity Check hepatitis A antibody total for immunity Continue to avoid Tylenol and over-the-counter supplements. Continue to avoid alcohol. Follow-up in 6 months.

## 2019-04-18 NOTE — Progress Notes (Signed)
ASMA slightly elevated at twenty-four, IgA slightly elevated at four thirty, IgG slightly elevated at one thousand five hundred ninety-one, IgM normal.  AMA normal.  No immunity to hepatitis A or B.  Waiting on alpha one antitrypsin phenotype to result.  Elmo Putt, please let patient know the above results.  At this point, the slight elevation of a couple autoimmune markers are nonspecific.  We will continue to monitor her LFTs and follow-up in 6 months as planned.  As she does not have immunity to hepatitis A or B, she needs to be vaccinated.  She can have these completed through her PCP.

## 2019-04-19 LAB — ANTI-SMOOTH MUSCLE ANTIBODY, IGG: Actin (Smooth Muscle) Antibody (IGG): 24 U — ABNORMAL HIGH (ref <20)

## 2019-04-19 LAB — IGG, IGA, IGM
IgG (Immunoglobin G), Serum: 1591 mg/dL — ABNORMAL HIGH (ref 600–1540)
IgM, Serum: 131 mg/dL (ref 50–300)
Immunoglobulin A: 430 mg/dL — ABNORMAL HIGH (ref 70–320)

## 2019-04-19 LAB — ALPHA-1 ANTITRYPSIN PHENOTYPE: A-1 Antitrypsin, Ser: 183 mg/dL (ref 83–199)

## 2019-04-19 LAB — HEPATITIS B SURFACE ANTIBODY,QUALITATIVE: Hep B S Ab: NONREACTIVE

## 2019-04-19 LAB — CERULOPLASMIN: Ceruloplasmin: 45 mg/dL (ref 18–53)

## 2019-04-19 LAB — HEPATITIS A ANTIBODY, TOTAL: Hepatitis A AB,Total: NONREACTIVE

## 2019-04-19 LAB — MITOCHONDRIAL ANTIBODIES: Mitochondrial M2 Ab, IgG: <=20 U

## 2019-05-16 ENCOUNTER — Encounter: Payer: Self-pay | Admitting: Family Medicine

## 2019-05-16 ENCOUNTER — Other Ambulatory Visit: Payer: Self-pay

## 2019-05-16 ENCOUNTER — Ambulatory Visit: Payer: BC Managed Care – PPO | Admitting: Family Medicine

## 2019-05-16 VITALS — BP 129/74 | HR 82 | Temp 97.6°F | Ht 64.0 in | Wt 154.8 lb

## 2019-05-16 DIAGNOSIS — L519 Erythema multiforme, unspecified: Secondary | ICD-10-CM | POA: Diagnosis not present

## 2019-05-16 LAB — CBC WITH DIFFERENTIAL/PLATELET
Absolute Monocytes: 314 cells/uL (ref 200–950)
Basophils Absolute: 22 cells/uL (ref 0–200)
Basophils Relative: 0.4 %
Eosinophils Absolute: 336 cells/uL (ref 15–500)
Eosinophils Relative: 6.1 %
HCT: 39 % (ref 35.0–45.0)
Hemoglobin: 12.7 g/dL (ref 11.7–15.5)
Lymphs Abs: 1150 cells/uL (ref 850–3900)
MCH: 27.8 pg (ref 27.0–33.0)
MCHC: 32.6 g/dL (ref 32.0–36.0)
MCV: 85.3 fL (ref 80.0–100.0)
MPV: 11 fL (ref 7.5–12.5)
Monocytes Relative: 5.7 %
Neutro Abs: 3680 cells/uL (ref 1500–7800)
Neutrophils Relative %: 66.9 %
Platelets: 223 10*3/uL (ref 140–400)
RBC: 4.57 10*6/uL (ref 3.80–5.10)
RDW: 13.7 % (ref 11.0–15.0)
Total Lymphocyte: 20.9 %
WBC: 5.5 10*3/uL (ref 3.8–10.8)

## 2019-05-16 NOTE — Patient Instructions (Addendum)
Dermatology referral cbc    If you have lab work done today you will be contacted with your lab results within the next 2 weeks.  If you have not heard from [place] then please contact us. The fastest way to get your results is to register for My Chart.   IF you received an x-ray today, you will receive an invoice from [facility]. Please contact [facility] at [phone] with questions or concerns regarding your invoice.   IF you received labwork today, you will receive an invoice from [facility]. Please contact [facility] at [phone] with questions or concerns regarding your invoice.   Our billing staff will not be able to assist you with questions regarding bills from these companies.  You will be contacted with the lab results as soon as they are available. The fastest way to get your results is to activate your My Chart account. Instructions are located on the last page of this paperwork. If you have not heard from [place] regarding the results in 2 weeks, please contact this office.

## 2019-05-16 NOTE — Progress Notes (Signed)
Established Patient Office Visit  Subjective:  Patient ID: Faith Hood, female    DOB: 26-Jun-1955  Age: 64 y.o. MRN: [id number]  CC:  Chief Complaint  Patient presents with  . Rash    rash/dark spots on both legs and arms started back in Jan2021 and got worse in the last few months    HPI Faith Hood states areas on legs and arms started as bruising now lesions that are irritated but not painful. Pt noted on upper thighs then lower legs and now arms-pt has not seen areas on back, chest or stomach. No change in diet, detergent, water. Pt states no tick bites. No respiratory infection. No lesions on the mouth. Pt takes blood pressure medication and has not changed medications by prescription on over the counter medications of supplements.   Past Medical History:  Diagnosis Date  . Cerebral vascular disease    Carotid ultrasound in 10/2011: Mild plaque; tortuous vessels; no definite luminal obstruction.  . Hepatic steatosis    By CT scan  . Hyperlipidemia   . Hypertension   . Overweight(278.02)   . Uterine leiomyoma    By CT scan    Past Surgical History:  Procedure Laterality Date  . COLONOSCOPY  12/2007   Negative screening study  . COLONOSCOPY N/A 08/17/2018   Procedure: COLONOSCOPY;  Surgeon: Daneil Dolin, MD;  [facility];  Service: Endoscopy;  Laterality: N/A;  12:00-rescheduled to 4/17 same time per Almyra Free  . DIAGNOSTIC LAPAROSCOPY  1978   Gynecologic problems    Family History  Problem Relation Age of Onset  . Pancreatic cancer Mother 79       Diagnosed in 2009; currently in hospice pt. htn  . Hypertension Mother        + Sister x4  . Cancer Father 60       brain tumor  . Hypertension Sister   . Hypertension Sister   . Hypertension Sister   . Stroke Sister   . Hypertension Sister   . Cancer Maternal Grandmother   . Cancer Paternal Grandmother   . Colon cancer Neg Hx     Social History   Socioeconomic History  . Marital status: Single   Spouse name: Not on file  . Number of children: 0  . Years of education: Not on file  . Highest education level: Not on file  Occupational History  . 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 Smoking status: Never Smoker  . Smokeless tobacco: Never Used  Substance and Sexual Activity  . Alcohol use: Yes    Comment: occasionally. About twice a year.   . Drug use: No  . Sexual activity: Not Currently  Other Topics Concern  . Not on file  Social History Narrative  . Not on file   Social Determinants of Health   Financial Resource Strain:   . Difficulty of Paying Living Expenses:   Food Insecurity:   . Worried About Charity fundraiser in the Last Year:   . Arboriculturist in the Last Year:   Transportation Needs:   . Film/video editor (Medical):   Marland Kitchen Lack of Transportation (Non-Medical):   Physical Activity:   . Days of Exercise per Week:   . Minutes of Exercise per Session:   Stress:   . Feeling of Stress :   Social Connections:   . Frequency of Communication with Friends and Family:   .  Frequency of Social Gatherings with Friends and Family:   . Attends Religious Services:   . Active Member of Clubs or Organizations:   . Attends Archivist Meetings:   Marland Kitchen Marital Status:   Intimate Partner Violence:   . Fear of Current or Ex-Partner:   . Emotionally Abused:   Marland Kitchen Physically Abused:   . Sexually Abused:     Outpatient Medications Prior to Visit  Medication Sig Dispense Refill  . amLODipine (NORVASC) 10 MG tablet Take 1 tablet (10 mg total) by mouth daily. 90 tablet 3  . aspirin EC 81 MG tablet Take 81 mg by mouth daily.    Marland Kitchen losartan (COZAAR) 100 MG tablet Take 1 tablet (100 mg total) by mouth daily. 90 tablet 3   No facility-administered medications prior to visit.    No Known Allergies  ROS Review of Systems  Constitutional: Negative.   HENT: Negative.   Respiratory: Negative.   Cardiovascular: Negative.    Gastrointestinal: Negative.   Endocrine: Negative.   Genitourinary: Negative.   Musculoskeletal: Negative.   Skin:       Circular lesions -pigmented, non itchy "bruises"  Allergic/Immunologic: Negative.   Neurological: Negative.   Hematological: Negative.   Psychiatric/Behavioral: Negative.       Objective:    Physical Exam  Constitutional: She appears well-developed and well-nourished.  HENT:  Head: Normocephalic and atraumatic.  Cardiovascular: Normal rate and regular rhythm.  Pulmonary/Chest: Effort normal and breath sounds normal.  Musculoskeletal:        General: Normal range of motion.  Skin: Rash noted.       BP 129/74 (BP Location: Right Arm, Patient Position: Sitting, Cuff Size: Normal)   Pulse 82   Temp 97.6 F (36.4 C) (Temporal)   Ht 5\' 4"  (1.626 m)   Wt 154 lb 12.8 oz (70.2 kg)   SpO2 97%   BMI 26.57 kg/m  Wt Readings from Last 3 Encounters:  05/16/19 154 lb 12.8 oz (70.2 kg)  03/24/19 153 lb 12.8 oz (69.8 kg)  03/09/19 152 lb 11.2 oz (69.3 kg)      Lab Results  Component Value Date   TSH 7.20 (H) 06/23/2018   Lab Results  Component Value Date   WBC 6.3 01/25/2019   HGB 12.5 01/25/2019   HCT 38.5 01/25/2019   MCV 86.3 01/25/2019   PLT 207 01/25/2019   Lab Results  Component Value Date   NA 138 12/12/2018   K 4.1 12/12/2018   CO2 26 12/12/2018   GLUCOSE 90 12/12/2018   BUN 13 12/12/2018   CREATININE 0.80 03/07/2019   BILITOT 0.7 03/09/2019   ALKPHOS 99 03/09/2019   AST 68 (H) 03/09/2019   ALT 55 (H) 03/09/2019   PROT 8.0 03/09/2019   ALBUMIN 3.9 03/09/2019   CALCIUM 9.4 12/12/2018   Lab Results  Component Value Date   CHOL 141 12/12/2018   Lab Results  Component Value Date   HDL 23 (L) 12/12/2018   Lab Results  Component Value Date   LDLCALC 99 12/12/2018   Lab Results  Component Value Date   TRIG 96 12/12/2018   Lab Results  Component Value Date   CHOLHDL 6.1 (H) 12/12/2018   Lab Results  Component Value  Date   HGBA1C 5.5 06/23/2018      Assessment & Plan:  1. Erythema multiforme - Ambulatory referral to Dermatology - CBC w/Diff/Platelet-wnl Follow-up:    Faith Rosinski Hannah Beat, MD

## 2019-05-17 ENCOUNTER — Telehealth: Payer: Self-pay | Admitting: Emergency Medicine

## 2019-05-17 ENCOUNTER — Encounter: Payer: Self-pay | Admitting: Family Medicine

## 2019-05-17 NOTE — Telephone Encounter (Signed)
-----   Message from Maryruth Hancock, MD sent at 05/16/2019 10:18 PM EDT ----- Normal cbc.

## 2019-05-17 NOTE — Telephone Encounter (Signed)
Left a msg for patient to return call ref lab results

## 2019-07-10 ENCOUNTER — Encounter: Payer: BC Managed Care – PPO | Admitting: Family Medicine

## 2019-08-08 ENCOUNTER — Other Ambulatory Visit: Payer: Self-pay

## 2019-08-08 ENCOUNTER — Encounter: Payer: Self-pay | Admitting: Family Medicine

## 2019-08-08 ENCOUNTER — Ambulatory Visit (INDEPENDENT_AMBULATORY_CARE_PROVIDER_SITE_OTHER): Payer: BC Managed Care – PPO | Admitting: Family Medicine

## 2019-08-08 VITALS — BP 151/91 | HR 94 | Temp 97.9°F | Resp 15 | Ht 64.0 in | Wt 147.1 lb

## 2019-08-08 DIAGNOSIS — E785 Hyperlipidemia, unspecified: Secondary | ICD-10-CM

## 2019-08-08 DIAGNOSIS — Z0000 Encounter for general adult medical examination without abnormal findings: Secondary | ICD-10-CM

## 2019-08-08 DIAGNOSIS — M300 Polyarteritis nodosa: Secondary | ICD-10-CM | POA: Diagnosis not present

## 2019-08-08 DIAGNOSIS — I10 Essential (primary) hypertension: Secondary | ICD-10-CM | POA: Diagnosis not present

## 2019-08-08 MED ORDER — HYDROXYZINE HCL 10 MG PO TABS: ORAL_TABLET | ORAL | 0 refills | Status: DC

## 2019-08-08 MED ORDER — AMLODIPINE BESYLATE 2.5 MG PO TABS: 2.5000 mg | ORAL_TABLET | Freq: Every day | ORAL | 3 refills | Status: DC

## 2019-08-08 NOTE — Progress Notes (Signed)
    Faith Hood     MRN: [id number]      DOB: 23-May-1955  HPI: Patient is in for annual physical exam. 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 HEENT No facial trauma or asymetry. Sinuses non tender.  Extra occullar muscles intact.. External ears normal, . Neck: supple, no adenopathy,JVD or thyromegaly.No bruits.  Chest: Clear to ascultation bilaterally.No crackles or wheezes. Non tender to palpation  Breast: No asymetry,no masses or lumps. No tenderness. No nipple discharge or inversion. No axillary or supraclavicular adenopathy  Cardiovascular system; Heart sounds normal,  S1 and  S2 ,no S3.  No murmur, or thrill. Apical beat not displaced Peripheral pulses normal.  Abdomen: Soft, non tender, no organomegaly or masses. No bruits. Bowel sounds normal. No guarding, tenderness or rebound.   GU: Asymptomatic , not examined  Musculoskeletal exam: Full ROM of spine, hips , shoulders and knees. No deformity ,swelling or crepitus noted. No muscle wasting or atrophy.   Neurologic: Cranial nerves 2 to 12 intact. Power, tone ,sensation and reflexes normal throughout. No disturbance in gait. No tremor.  Skin: Intact, hyperpigmented.macula papular lesions in both lower extremities Punctate hyperemic lesions on upper chest and upper back, states they are bug bites  Psych; Normal mood and slightly anxious affect. Judgement and concentration  normal   Assessment & Plan:  Annual physical exam Annual exam as documented.  Immunization and cancer screening needs are specifically addressed at this visit.   Hypertension additonal 2.5 mg amlodipine addded and f f/u in 6 weeks, uncontrolled and not at goal DASH diet and commitment to daily physical activity for a minimum of 30 minutes discussed and encouraged, as a part of hypertension management. The importance of attaining a healthy weight is also discussed.  BP/Weight 08/08/2019 05/16/2019 03/24/2019 03/09/2019 02/14/2019 01/02/2019 42/68/3419  Systolic BP 622 297 989 211 941 740 814  Diastolic BP 91 74 78 78 80 86 80  Wt. (Lbs) 147.12 154.8 153.8 152.7 151.1 152.2 156  BMI 25.25 26.57 27.24 26.21 25.94 26.96 27.63   Likely related to steroid use

## 2019-08-08 NOTE — Assessment & Plan Note (Addendum)
additonal 2.5 mg amlodipine addded and f f/u in 6 weeks, uncontrolled and not at goal DASH diet and commitment to daily physical activity for a minimum of 30 minutes discussed and encouraged, as a part of hypertension management. The importance of attaining a healthy weight is also discussed.  BP/Weight 08/08/2019 05/16/2019 03/24/2019 03/09/2019 02/14/2019 01/02/2019 37/48/2707  Systolic BP 867 544 920 100 712 197 588  Diastolic BP 91 74 78 78 80 86 80  Wt. (Lbs) 147.12 154.8 153.8 152.7 151.1 152.2 156  BMI 25.25 26.57 27.24 26.21 25.94 26.96 27.63   Likely related to steroid use

## 2019-08-08 NOTE — Patient Instructions (Signed)
Follow-up in office with MD in 6 weeks call if you need me sooner.  Blood pressure is elevated you have a new additional prescription of amlodipine 2.5 mg 1 daily continue amlodipine 10 mg 1 daily as before as well as Cozaar.  Please get fasting lipid CMP and EGFR 5 days before your follow-up visit.  We will send for the report from your dermatologist to update your record.  Please leave the name of the rheumatologist you will see tomorrow with her so we can get an update from him as well.  New to help with itching is hydroxyzine 1 at bedtime.  Hope that you soon feel better.  Thanks for choosing [facility], we consider it a privelige to serve you.

## 2019-08-08 NOTE — Assessment & Plan Note (Addendum)
Annual exam as documented. . Immunization and cancer screening needs are specifically addressed at this visit.  

## 2019-08-09 ENCOUNTER — Other Ambulatory Visit: Payer: Self-pay

## 2019-08-09 MED ORDER — LOSARTAN POTASSIUM 100 MG PO TABS: 100.0000 mg | ORAL_TABLET | Freq: Every day | ORAL | 3 refills | Status: DC

## 2019-08-11 ENCOUNTER — Encounter: Payer: Self-pay | Admitting: Family Medicine

## 2019-08-31 ENCOUNTER — Inpatient Hospital Stay (HOSPITAL_COMMUNITY): Payer: BC Managed Care – PPO | Attending: Hematology

## 2019-09-07 ENCOUNTER — Ambulatory Visit (HOSPITAL_COMMUNITY): Payer: BC Managed Care – PPO | Admitting: Hematology

## 2019-09-14 ENCOUNTER — Inpatient Hospital Stay (HOSPITAL_COMMUNITY): Payer: BC Managed Care – PPO | Attending: Hematology

## 2019-09-14 LAB — COMPLETE METABOLIC PANEL WITH GFR
AG Ratio: 1.6 (calc) (ref 1.0–2.5)
ALT: 41 U/L — ABNORMAL HIGH (ref 6–29)
AST: 21 U/L (ref 10–35)
Albumin: 3.9 g/dL (ref 3.6–5.1)
Alkaline phosphatase (APISO): 140 U/L (ref 37–153)
BUN: 23 mg/dL (ref 7–25)
CO2: 27 mmol/L (ref 20–32)
Calcium: 9.5 mg/dL (ref 8.6–10.4)
Chloride: 104 mmol/L (ref 98–110)
Creat: 0.84 mg/dL (ref 0.50–0.99)
GFR, Est African American: 86 mL/min/{1.73_m2} (ref >=60)
GFR, Est Non African American: 74 mL/min/{1.73_m2} (ref >=60)
Globulin: 2.5 g/dL (calc) (ref 1.9–3.7)
Glucose, Bld: 88 mg/dL (ref 65–139)
Potassium: 4 mmol/L (ref 3.5–5.3)
Sodium: 139 mmol/L (ref 135–146)
Total Bilirubin: 1 mg/dL (ref 0.2–1.2)
Total Protein: 6.4 g/dL (ref 6.1–8.1)

## 2019-09-14 LAB — LIPID PANEL
Cholesterol: 239 mg/dL — ABNORMAL HIGH (ref <200)
HDL: 71 mg/dL (ref >=50)
LDL Cholesterol (Calc): 143 mg/dL (calc) — ABNORMAL HIGH
Non-HDL Cholesterol (Calc): 168 mg/dL (calc) — ABNORMAL HIGH (ref <130)
Total CHOL/HDL Ratio: 3.4 (calc) (ref <5.0)
Triglycerides: 128 mg/dL (ref <150)

## 2019-09-19 ENCOUNTER — Other Ambulatory Visit: Payer: Self-pay

## 2019-09-19 ENCOUNTER — Encounter: Payer: Self-pay | Admitting: Family Medicine

## 2019-09-19 ENCOUNTER — Ambulatory Visit (INDEPENDENT_AMBULATORY_CARE_PROVIDER_SITE_OTHER): Payer: BC Managed Care – PPO | Admitting: Family Medicine

## 2019-09-19 VITALS — BP 146/90 | HR 99 | Resp 16 | Ht 64.0 in | Wt 153.0 lb

## 2019-09-19 DIAGNOSIS — E038 Other specified hypothyroidism: Secondary | ICD-10-CM | POA: Diagnosis not present

## 2019-09-19 DIAGNOSIS — I10 Essential (primary) hypertension: Secondary | ICD-10-CM | POA: Diagnosis not present

## 2019-09-19 DIAGNOSIS — M300 Polyarteritis nodosa: Secondary | ICD-10-CM

## 2019-09-19 DIAGNOSIS — E559 Vitamin D deficiency, unspecified: Secondary | ICD-10-CM | POA: Diagnosis not present

## 2019-09-19 DIAGNOSIS — Z7952 Long term (current) use of systemic steroids: Secondary | ICD-10-CM

## 2019-09-19 DIAGNOSIS — R490 Dysphonia: Secondary | ICD-10-CM | POA: Diagnosis not present

## 2019-09-19 DIAGNOSIS — E663 Overweight: Secondary | ICD-10-CM

## 2019-09-19 DIAGNOSIS — Z780 Asymptomatic menopausal state: Secondary | ICD-10-CM

## 2019-09-19 DIAGNOSIS — E7849 Other hyperlipidemia: Secondary | ICD-10-CM

## 2019-09-19 MED ORDER — CLONIDINE HCL 0.3 MG PO TABS: ORAL_TABLET | ORAL | 3 refills | Status: DC

## 2019-09-19 NOTE — Patient Instructions (Addendum)
Follow-up with MD to reevaluate blood pressure in 8 to 10 weeks call if you need me sooner.    New additional medication for blood pressure is clonidine 0.3 mg take 1 at bedtime.  Continue amlodipine and losartan  as before.  TSH and vit D non fasting needed also non fast chem 7 and EGFR 1 week before follow up  You are referred to ENT to evaluate painless hoarseness.  Start calcium with vitamin D 1000 to 1200 mg of calcium with 800 to 1000 international units of vitamin D total dose once daily.  I will be in touch re need for bone density screening or addition of a bone builder  Thanks for choosing [facility], we consider it a privelige to serve you.

## 2019-09-19 NOTE — Progress Notes (Signed)
Referring Provider: Fayrene Helper, MD Primary Care Physician:  Fayrene Helper, MD Primary GI Physician: Dr. Gala Romney  Chief Complaint  Patient presents with  . Elevated Hepatic Enzymes    HPI:   Faith Hood is a 64 y.o. female  with a GIhistorysignificant for fatty liver noted on CT in 2009. Noted to have mild elevation of LFTs since November 2018. No evidence of Hep B or C. No immunity to Hep A/B. She had elevation of ferritin. Hemochromatosis revealing H63D heterozygosity. She was referred to oncology. Repeat ferritin had normalized in Jan 2021.  Also previously noted nausea without vomiting worsened by breads or sugar with associated few lb weight loss in November 2020.  HIDA in December 2020 normal. TCS up to date in June 2020, due for repeat in 2030.   Last seen in our office 03/24/2019.  Overall thought she was doing much better.  Appetite improved.  Reported she was eating healthier and limiting fried and fatty foods, foods high in sugar, and meats.  Rare nausea.  If this occurred, it was around the time of a BM.  Previously empirically tried her on Protonix which she was intolerant to due to dizziness/tiredness. No PPI needed as she did not have any GERD symptoms.  Weight stable/up 1 pound.  BMs 2-3 times a week.  Occasional hard stools with straining.  No BRBPR or melena.No symptoms of decompensated liver disease.  Rare alcohol.  No OTC supplements or herbal teas.  HFP in January with LFTs continuing to be mildly elevated at AST 68 ALT 55.  Noted spots on her arms and thighs that were itchy.  They would go away but more appear.  They have been present since June 2020.  No known tick bite or changes in laundry detergent.  For mild constipation, advise she add daily fiber supplement, use MiraLAX if needed.  For mildly elevated LFTs, would complete additional serologic work-up.  If unrevealing and LFTs continue to remain elevated, would need to could potentially consider liver  biopsy.  Labs completed 04/12/2019: AMA negative, ASMA elevated at 24, IgG elevated at 1591, IgA elevated at 430, no evidence of alpha-1 antitrypsin deficiency, ceruloplasmin 45, no immunity to hepatitis A or B.  Recommended vaccination to hep A/B with PCP.  CMP completed 09/14/2019 with PCP revealing AST 21, ALT 41, alk phos 140, total bilirubin 1.0.  Reviewed recent office visit with PCP dated 08/08/2019.  Apparently patient has new rheumatologic diagnoses, associated with skin rash on legs, polyarteritis nodosa, biopsy-proven, followed by rheumatology and is on steroids.   Today: Continues to have some spots on her legs. The spots on her arms have almost completely cleared up. Not itching any more. She is seeing [facility]. She has follow-up in September to determine how long she will be on prednisone.   Can't sleep at night due to prednisone. No nausea or vomiting. Appetite is up with prednisone. No abdominal pain. No GERD symptoms or dysphagia. Has some hoarseness x1 week. Plans to see ENT.   BMs daily. Decreased sugary products and increased vegetables. No constipation or diarrhea. No brbpr or melena.   Swelling in her lower extremities has essentially resolved. No swelling in her abdomen, confusion or changes in mental status, bruising or bleeding, bark urine, or yellowing of her eyes.     Past Medical History:  Diagnosis Date  . Cerebral vascular disease    Carotid ultrasound in 10/2011: Mild plaque; tortuous vessels; no definite luminal obstruction.  Marland Kitchen  Hepatic steatosis    By CT scan  . Hyperlipidemia   . Hypertension   . Overweight(278.02)   . Uterine leiomyoma    By CT scan    Past Surgical History:  Procedure Laterality Date  . COLONOSCOPY  12/2007   Negative screening study  . COLONOSCOPY N/A 08/17/2018   Procedure: COLONOSCOPY;  Surgeon: Daneil Dolin, MD;  [facility]: [facility];  Service: Endoscopy;  Laterality: N/A;  12:00-rescheduled to 4/17 same time  per Almyra Free  . DIAGNOSTIC LAPAROSCOPY  1978   Gynecologic problems    Current Outpatient Medications  Medication Sig Dispense Refill  . amLODipine (NORVASC) 10 MG tablet Take 1 tablet (10 mg total) by mouth daily. 90 tablet 3  . amLODipine (NORVASC) 2.5 MG tablet Take 1 tablet (2.5 mg total) by mouth daily. 30 tablet 3  . aspirin EC 81 MG tablet Take 81 mg by mouth daily.    . Calcium Carbonate-Vitamin D (CALCIUM 500 + D PO) Take by mouth 2 (two) times daily.    . cloNIDine (CATAPRES) 0.3 MG tablet Take one tablet by mouth daily at bedtime 30 tablet 3  . losartan (COZAAR) 100 MG tablet Take 1 tablet (100 mg total) by mouth daily. 90 tablet 3  . predniSONE (DELTASONE) 5 MG tablet Take 5 mg by mouth in the morning and at bedtime.    Marland Kitchen PREDNISONE PO Take 20 mg by mouth in the morning and at bedtime. Pt takes 2 doses    . hydrOXYzine (ATARAX/VISTARIL) 10 MG tablet Take one tablet by mouth , at bedtime, as needed, for itching (Patient not taking: Reported on 09/20/2019) 30 tablet 0   No current facility-administered medications for this visit.    Allergies as of 09/20/2019  . (No Known Allergies)    Family History  Problem Relation Age of Onset  . Pancreatic cancer Mother 49       Diagnosed in 2009; currently in hospice pt. htn  . Hypertension Mother        + Sister x4  . Cancer Father 40       brain tumor  . Hypertension Sister   . Hypertension Sister   . Hypertension Sister   . Stroke Sister   . Hypertension Sister   . Cancer Maternal Grandmother   . Cancer Paternal Grandmother   . Colon cancer Neg Hx     Social History   Socioeconomic History  . Marital status: Single    Spouse name: Not on file  . Number of children: 0  . Years of education: Not on file  . Highest education level: Not on file  Occupational History  . 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 Smoking status: Never Smoker  . Smokeless tobacco: Never Used    Substance and Sexual Activity  . Alcohol use: Not Currently    Comment: occasionally. About twice a year.   . Drug use: No  . Sexual activity: Not Currently  Other Topics Concern  . Not on file  Social History Narrative  . Not on file   Social Determinants of Health   Financial Resource Strain:   . Difficulty of Paying Living Expenses:   Food Insecurity:   . Worried About Charity fundraiser in the Last Year:   . Arboriculturist in the Last Year:   Transportation Needs:   . Film/video editor (Medical):   Marland Kitchen Lack of Transportation (Non-Medical):  Physical Activity:   . Days of Exercise per Week:   . Minutes of Exercise per Session:   Stress:   . Feeling of Stress :   Social Connections:   . Frequency of Communication with Friends and Family:   . Frequency of Social Gatherings with Friends and Family:   . Attends Religious Services:   . Active Member of Clubs or Organizations:   . Attends Archivist Meetings:   Marland Kitchen Marital Status:     Review of Systems: Gen: Denies fever, chills, lightheadedness, dizziness, pre-syncope, or syncope.  CV: Denies chest pain or palpitations Resp: Denies dyspnea or cough GI: See HPI MSK: Some pain in her legs and lower back Derm: See HPI Heme: See HPI  Physical Exam: BP 128/80   Pulse 95   Temp (!) 96.8 F (36 C) (Temporal)   Ht '5\' 4"'  (1.626 m)   Wt 153 lb 3.2 oz (69.5 kg)   BMI 26.30 kg/m  General:   Alert and oriented. No distress noted. Pleasant and cooperative.  Head:  Normocephalic and atraumatic. Eyes:  Conjuctiva clear without scleral icterus. Heart:  S1, S2 present without murmurs appreciated. Lungs:  Clear to auscultation bilaterally. No wheezes, rales, or rhonchi. No distress.  Abdomen:  +BS, soft, non-tender and non-distended. No rebound or guarding. No HSM or masses noted. Msk:  Symmetrical without gross deformities. Normal posture. Extremities:  Without edema. Neurologic:  Alert and  oriented x4 Psych:  Normal mood and affect.

## 2019-09-20 ENCOUNTER — Encounter: Payer: Self-pay | Admitting: Gastroenterology

## 2019-09-20 ENCOUNTER — Ambulatory Visit: Payer: BC Managed Care – PPO | Admitting: Gastroenterology

## 2019-09-20 VITALS — BP 128/80 | HR 95 | Temp 96.8°F | Ht 64.0 in | Wt 153.2 lb

## 2019-09-20 DIAGNOSIS — R194 Change in bowel habit: Secondary | ICD-10-CM

## 2019-09-20 DIAGNOSIS — R7989 Other specified abnormal findings of blood chemistry: Secondary | ICD-10-CM

## 2019-09-20 NOTE — Assessment & Plan Note (Addendum)
64 year old female with past medical history of HTN, HLD, fatty liver on CT in 2009, mildly elevated LFTs since November 2018.  Work-up has included CBC with normal platelets, negative acute hepatitis panel, ultrasound in November 2020 within normal limits, INR normal, celiac screen negative, ANA negative, ASMA elevated at 24, IgG elevated at 1591, IgA elevated at 130, ceruloplasmin within normal limits, no evidence of alpha 1 antitrypsin deficiency.  Iron panel with ferritin 430H.  Hemochromatosis labs revealed H63D heterozygosity.  She had follow-up with hematology with repeat ferritin back to normal.  ANA and rheumatoid factor negative.  MRI with unremarkable liver. Interestingly, patient was recently diagnosed with polyarteritis nodosa and is following with rheumatology in [place].  She has been started on steroids and most recent CMP 09/14/2019 with improvement in LFTs: AST 21, ALT 41, which is the lowest they have been in quite some time.  Query whether polyarteritis nodosa may have been the cause of elevated LFTs.  Patient has follow-up with rheumatology in September to determine how long she will be on steroids.  I am requesting their records for review. At this point, we will continue to monitor. Plan to follow-up in 4 months with repeat LFTs.

## 2019-09-20 NOTE — Assessment & Plan Note (Addendum)
Mild constipation improved with eating healthier and increasing fiber in her diet.  Advise she continue with dietary changes and monitor for any return of constipation.

## 2019-09-20 NOTE — Progress Notes (Signed)
CC'ED TO PCP 

## 2019-09-20 NOTE — Patient Instructions (Addendum)
Continue eating plenty of fiber to keep your bowel moving regularly.   Continue avoiding fried, fatty, greasy, spicy foods. Avoid foods high in sugar. Eat lean meats including poultry or fishy.   We will plan to see you back in 4 months.   Aliene Altes, PA-C [facility]

## 2019-09-21 ENCOUNTER — Ambulatory Visit (HOSPITAL_COMMUNITY): Payer: BC Managed Care – PPO | Admitting: Nurse Practitioner

## 2019-09-23 ENCOUNTER — Encounter: Payer: Self-pay | Admitting: Family Medicine

## 2019-09-23 NOTE — Assessment & Plan Note (Signed)
Improving on chronic steroids, needs dexa and bone builder

## 2019-09-23 NOTE — Assessment & Plan Note (Signed)
Uncontrolled, med adjustment made DASH diet and commitment to daily physical activity for a minimum of 30 minutes discussed and encouraged, as a part of hypertension management. The importance of attaining a healthy weight is also discussed.  BP/Weight 09/20/2019 09/19/2019 08/08/2019 05/16/2019 03/24/2019 03/09/2019 55/02/5866  Systolic BP 257 493 552 174 715 953 967  Diastolic BP 80 90 91 74 78 78 80  Wt. (Lbs) 153.2 153.04 147.12 154.8 153.8 152.7 151.1  BMI 26.3 26.27 25.25 26.57 27.24 26.21 25.94

## 2019-09-23 NOTE — Assessment & Plan Note (Signed)
Hyperlipidemia:Low fat diet discussed and encouraged.   Lipid Panel  Lab Results  Component Value Date   CHOL 239 (H) 09/14/2019   HDL 71 09/14/2019   LDLCALC 143 (H) 09/14/2019   TRIG 128 09/14/2019   CHOLHDL 3.4 09/14/2019   Needs to reduce fat in diet, not at goal

## 2019-09-23 NOTE — Assessment & Plan Note (Signed)
Chronic painless hoarseness x weeks, eNT to eval

## 2019-09-23 NOTE — Progress Notes (Signed)
Faith Hood     MRN: [id number]      DOB: [birth year]   HPI Faith Hood is here for follow up and re-evaluation of chronic medical conditions, medication management and review of any available recent lab and radiology data.  Preventive health is updated, specifically  Cancer screening and Immunization.   Questions or concerns regarding consultations or procedures which the PT has had in the interim are  addressed. The PT denies any adverse reactions to current medications since the last visit.  C/o painless hoarseness x weeks   ROS Denies recent fever or chills. Denies sinus pressure, nasal congestion, ear pain or sore throat. Denies chest congestion, productive cough or wheezing. Denies chest pains, palpitations and leg swelling Denies abdominal pain, nausea, vomiting,diarrhea or constipation.   Denies dysuria, frequency, hesitancy or incontinence. Denies joint pain, swelling and limitation in mobility. Denies headaches, seizures, numbness, or tingling. Denies depression, anxiety or insomnia. Denies skin break down or rash.   PE  BP (!) 146/90   Pulse 99   Resp 16   Ht 5\' 4"  (1.626 m)   Wt 153 lb 0.6 oz (69.4 kg)   SpO2 97%   BMI 26.27 kg/m   Patient alert and oriented and in no cardiopulmonary distress.  HEENT: No facial asymmetry, EOMI,     Neck supple .  Chest: Clear to auscultation bilaterally.  CVS: S1, S2 no murmurs, no S3.Regular rate.  ABD: Soft non tender.   Ext: No edema  MS: Adequate ROM spine, shoulders, hips and knees.  Skin: Intact,  rash noted.  Psych: Good eye contact, normal affect. Memory intact not anxious or depressed appearing.  CNS: CN 2-12 intact, power,  normal throughout.no focal deficits noted.   Assessment & Plan  Hypertension Uncontrolled, med adjustment made DASH diet and commitment to daily physical activity for a minimum of 30 minutes discussed and encouraged, as a part of hypertension management. The importance of attaining a  healthy weight is also discussed.  BP/Weight 09/20/2019 09/19/2019 08/08/2019 05/16/2019 03/24/2019 03/09/2019 19/37/9024  Systolic BP 097 353 299 242 683 419 622  Diastolic BP 80 90 91 74 78 78 80  Wt. 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  The importance of healthy food choices with portion control discussed, as well as eating regularly and within a 12 hour window most days. The need to choose "clean , green" food 50 to 75% of the time is discussed, as well as to make water the primary drink and set a goal of 64 ounces water daily.    Weight /BMI 09/20/2019 09/19/2019 08/08/2019  WEIGHT 153 lb 3.2 oz 153 lb 0.6 oz 147 lb 1.9 oz  HEIGHT 5\' 4"  5\' 4"  5\' 4"   BMI 26.3 kg/m2 26.27 kg/m2 25.25 kg/m2      Hyperlipidemia Hyperlipidemia:Low fat diet discussed and encouraged.   Lipid Panel  Lab Results  Component Value Date   CHOL 239 (H) 09/14/2019   HDL 71 09/14/2019   LDLCALC 143 (H) 09/14/2019   TRIG 128 09/14/2019   CHOLHDL 3.4 09/14/2019   Needs to reduce fat in diet, not at goal    Hoarseness of voice Chronic painless hoarseness x weeks,  eNT to eval

## 2019-09-23 NOTE — Assessment & Plan Note (Signed)
  Patient re-educated about  the importance of commitment to a  minimum of 150 minutes of exercise per week as able.  The importance of healthy food choices with portion control discussed, as well as eating regularly and within a 12 hour window most days. The need to choose "clean , green" food 50 to 75% of the time is discussed, as well as to make water the primary drink and set a goal of 64 ounces water daily.    Weight /BMI 09/20/2019 09/19/2019 08/08/2019  WEIGHT 153 lb 3.2 oz 153 lb 0.6 oz 147 lb 1.9 oz  HEIGHT 5\' 4"  5\' 4"  5\' 4"   BMI 26.3 kg/m2 26.27 kg/m2 25.25 kg/m2

## 2019-10-02 ENCOUNTER — Other Ambulatory Visit: Payer: Self-pay

## 2019-10-02 ENCOUNTER — Telehealth: Payer: Self-pay

## 2019-10-02 MED ORDER — AMLODIPINE BESYLATE 10 MG PO TABS: 10.0000 mg | ORAL_TABLET | Freq: Every day | ORAL | 1 refills | Status: DC

## 2019-10-02 MED ORDER — AMLODIPINE BESYLATE 2.5 MG PO TABS: 2.5000 mg | ORAL_TABLET | Freq: Every day | ORAL | 1 refills | Status: DC

## 2019-10-02 NOTE — Telephone Encounter (Signed)
Pt needs both called in she is OUT   amLODipine (NORVASC) 10 MG tablet amLODipine (NORVASC) 2.5 MG tablet   [facility] in [place]

## 2019-10-02 NOTE — Telephone Encounter (Signed)
Medications refilled and sent to pharmacy.

## 2019-10-04 ENCOUNTER — Ambulatory Visit (HOSPITAL_COMMUNITY)
Admission: RE | Admit: 2019-10-04 | Discharge: 2019-10-04 | Disposition: A | Discharge status: Home / Self Care | Payer: BC Managed Care – PPO | Source: Ambulatory Visit | Attending: Family Medicine | Admitting: Family Medicine

## 2019-10-04 ENCOUNTER — Other Ambulatory Visit: Payer: Self-pay

## 2019-10-04 DIAGNOSIS — Z7952 Long term (current) use of systemic steroids: Secondary | ICD-10-CM | POA: Diagnosis present

## 2019-10-04 DIAGNOSIS — Z780 Asymptomatic menopausal state: Secondary | ICD-10-CM | POA: Diagnosis not present

## 2019-10-05 ENCOUNTER — Other Ambulatory Visit: Payer: Self-pay | Admitting: Family Medicine

## 2019-10-05 MED ORDER — ALENDRONATE SODIUM 70 MG PO TABS
70.0000 mg | ORAL_TABLET | ORAL | 11 refills | Status: DC
Start: 2019-10-05 — End: 2020-10-09

## 2019-10-06 ENCOUNTER — Telehealth: Payer: Self-pay

## 2019-10-06 NOTE — Telephone Encounter (Signed)
Pt calling for lab results.

## 2019-10-06 NOTE — Telephone Encounter (Signed)
Left message for patient to return call.

## 2019-10-09 NOTE — Telephone Encounter (Signed)
Lvm to return call

## 2019-10-11 ENCOUNTER — Telehealth: Payer: Self-pay

## 2019-10-11 NOTE — Telephone Encounter (Signed)
Pt has been left a detailed message on her voicemail per dpr. Tried to call her back to reach her once again I got the voicemail

## 2019-10-11 NOTE — Telephone Encounter (Signed)
Pt calling about the results of the scan she had done on 10/04/19

## 2019-10-11 NOTE — Telephone Encounter (Signed)
Left detailed message on voicemail per pt request and to call back to confirm understanding

## 2019-10-13 ENCOUNTER — Ambulatory Visit (HOSPITAL_COMMUNITY): Payer: BC Managed Care – PPO | Admitting: Nurse Practitioner

## 2019-11-09 ENCOUNTER — Telehealth: Payer: Self-pay | Admitting: Family Medicine

## 2019-11-09 NOTE — Telephone Encounter (Signed)
Will need an in office appt with available provider

## 2019-11-09 NOTE — Telephone Encounter (Signed)
Pt called and said she has had persistent back pain for 2 weeks now and would like to know if there is a solution that can be done in office with maybe a injection.

## 2019-11-28 ENCOUNTER — Encounter: Payer: Self-pay | Admitting: Family Medicine

## 2019-11-28 ENCOUNTER — Other Ambulatory Visit: Payer: Self-pay

## 2019-11-28 ENCOUNTER — Ambulatory Visit (HOSPITAL_COMMUNITY)
Admission: RE | Admit: 2019-11-28 | Discharge: 2019-11-28 | Disposition: A | Discharge status: Home / Self Care | Payer: BC Managed Care – PPO | Source: Ambulatory Visit | Attending: Family Medicine | Admitting: Family Medicine

## 2019-11-28 ENCOUNTER — Ambulatory Visit (INDEPENDENT_AMBULATORY_CARE_PROVIDER_SITE_OTHER): Payer: BC Managed Care – PPO | Admitting: Family Medicine

## 2019-11-28 VITALS — BP 160/95 | HR 95 | Resp 16 | Ht 64.0 in | Wt 160.0 lb

## 2019-11-28 DIAGNOSIS — E038 Other specified hypothyroidism: Secondary | ICD-10-CM

## 2019-11-28 DIAGNOSIS — M5410 Radiculopathy, site unspecified: Secondary | ICD-10-CM | POA: Diagnosis not present

## 2019-11-28 DIAGNOSIS — M62830 Muscle spasm of back: Secondary | ICD-10-CM

## 2019-11-28 DIAGNOSIS — E559 Vitamin D deficiency, unspecified: Secondary | ICD-10-CM

## 2019-11-28 DIAGNOSIS — M549 Dorsalgia, unspecified: Secondary | ICD-10-CM | POA: Diagnosis not present

## 2019-11-28 DIAGNOSIS — I10 Essential (primary) hypertension: Secondary | ICD-10-CM

## 2019-11-28 DIAGNOSIS — Z1231 Encounter for screening mammogram for malignant neoplasm of breast: Secondary | ICD-10-CM | POA: Diagnosis not present

## 2019-11-28 DIAGNOSIS — M300 Polyarteritis nodosa: Secondary | ICD-10-CM

## 2019-11-28 DIAGNOSIS — E7849 Other hyperlipidemia: Secondary | ICD-10-CM

## 2019-11-28 MED ORDER — CLONIDINE HCL 0.3 MG PO TABS: ORAL_TABLET | ORAL | 3 refills | Status: DC

## 2019-11-28 MED ORDER — CYCLOBENZAPRINE HCL 5 MG PO TABS: 5.0000 mg | ORAL_TABLET | Freq: Three times a day (TID) | ORAL | 1 refills | Status: DC | PRN

## 2019-11-28 NOTE — Patient Instructions (Addendum)
F/U in office with MD, re evaluate blood pressure in 4 to 5 weeks, call if you need me sooner, flu vaccine at visit  Please start bedtime clonidine tonight for blood pressure, continue the other medications as before. Blood pressure is still high  Handicap sticker today    X ray of spine today  New muscle relaxant up to 3 times daily, has sedative side effect so start at bedtime only  You will likely be referred to Orthopedics or Neurosrgery, I will try to get MRI of low back after the xray as your pain and debility are so severe  Will obtain September visit from Dr Amil Amen  Please get non fasting labs ordered on 07/27 today, thanks

## 2019-11-28 NOTE — Progress Notes (Signed)
   AMABEL STMARIE     MRN: [id number]      DOB: [birth year]   HPI Ms. Smartt is here wih severe low back pain starting 09/09 , does radiate at times to both knees. Very stiff in the morning and painful at night. Walking is negatively affected, c/o bilateral numbness of her feet Seen Sept 9 by Rheumatology Dr Amil Amen, started on prednisone 40 mg x 2weeks, 30 x 2weeks, 10 mg x 1 monht then 5 mg x 1 month, then 2.5 mg x 1 month, to December currently being treated for polyarteriitis nodosa diagnosed in 2021. Skin is improving  ROS Denies recent fever or chills. Denies sinus pressure, nasal congestion, ear pain or sore throat. Denies chest congestion, productive cough or wheezing. Denies chest pains, palpitations and leg swelling Denies abdominal pain, nausea, vomiting,diarrhea or constipation.   Denies dysuria, frequency, hesitancy or incontinence.  Denies headaches, seizures, numbness, or tingling.   PE  BP (!) 160/95   Pulse 95   Resp 16   Ht 5\' 4"  (1.626 m)   Wt 160 lb (72.6 kg)   SpO2 95%   BMI 27.46 kg/m   Patient alert and oriented and in no cardiopulmonary distress.Patient is uncomfortable and in pain  HEENT: No facial asymmetry, EOMI,     Neck supple .  Chest: Clear to auscultation bilaterally.  CVS: S1, S2 no murmurs, no S3.Regular rate.  ABD: Soft non tender.   Ext: No edema  MS: markedly reduced  ROM lumbar spine,  Para spinal spasm, adequate in shoulders,   Skin: Intact, hypo and hyperpigmented macular  rash noted.  Psych: Good eye contact, normal affect. CNS: CN 2-12 intact, power,  normal throughout.no focal deficits noted.   Assessment & Plan  Back pain with radiculopathy 3 week h/o disabling back pain intermittently radiating to both legs, rated at a 9 to 10, markedly limiting mobility. No recent trauma. X ray of lumbar spine  Pt on systemic steroids high dose, unable to prescribe anti inflammatory / prednisone for relief. Refer to spine surgeon  Spasm  of muscle of lower back Flexeril up to 3 times daily as needed , for back spasm  Hypertension Uncontrolled, needs to take bedtime clonidine  DASH diet and commitment to daily physical activity for a minimum of 30 minutes discussed and encouraged, as a part of hypertension management. The importance of attaining a healthy weight is also discussed.  BP/Weight 11/28/2019 09/20/2019 09/19/2019 08/08/2019 05/16/2019 03/24/2019 9/47/6546  Systolic BP 503 546 568 127 517 001 749  Diastolic BP 95 80 90 91 74 78 78  Wt. (Lbs) 160 153.2 153.04 147.12 154.8 153.8 152.7  BMI 27.46 26.3 26.27 25.25 26.57 27.24 26.21        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HCC) Positive response to oral steroids managed by Rheumatology

## 2019-11-29 ENCOUNTER — Other Ambulatory Visit: Payer: Self-pay | Admitting: Family Medicine

## 2019-11-29 LAB — BASIC METABOLIC PANEL WITH GFR
BUN: 23 mg/dL (ref 7–25)
CO2: 28 mmol/L (ref 20–32)
Calcium: 10.1 mg/dL (ref 8.6–10.4)
Chloride: 105 mmol/L (ref 98–110)
Creat: 0.79 mg/dL (ref 0.50–0.99)
GFR, Est African American: 93 mL/min/{1.73_m2} (ref >=60)
GFR, Est Non African American: 80 mL/min/{1.73_m2} (ref >=60)
Glucose, Bld: 97 mg/dL (ref 65–139)
Potassium: 4.5 mmol/L (ref 3.5–5.3)
Sodium: 141 mmol/L (ref 135–146)

## 2019-11-29 LAB — VITAMIN D 25 HYDROXY (VIT D DEFICIENCY, FRACTURES): Vit D, 25-Hydroxy: 17 ng/mL — ABNORMAL LOW (ref 30–100)

## 2019-11-29 LAB — TSH: TSH: 1.38 mIU/L (ref 0.40–4.50)

## 2019-11-29 MED ORDER — ERGOCALCIFEROL 1.25 MG (50000 UT) PO CAPS: 50000.0000 [IU] | ORAL_CAPSULE | ORAL | 1 refills | Status: DC

## 2019-11-30 ENCOUNTER — Encounter: Payer: Self-pay | Admitting: Family Medicine

## 2019-11-30 DIAGNOSIS — M62830 Muscle spasm of back: Secondary | ICD-10-CM | POA: Insufficient documentation

## 2019-11-30 DIAGNOSIS — M5410 Radiculopathy, site unspecified: Secondary | ICD-10-CM | POA: Insufficient documentation

## 2019-11-30 NOTE — Assessment & Plan Note (Signed)
Positive response to oral steroids managed by Rheumatology

## 2019-11-30 NOTE — Assessment & Plan Note (Addendum)
3 week h/o disabling back pain intermittently radiating to both legs, rated at a 9 to 10, markedly limiting mobility. No recent trauma. X ray of lumbar spine  Pt on systemic steroids high dose, unable to prescribe anti inflammatory / prednisone for relief. Refer to spine surgeon

## 2019-11-30 NOTE — Assessment & Plan Note (Signed)
Uncontrolled, needs to take bedtime clonidine  DASH diet and commitment to daily physical activity for a minimum of 30 minutes discussed and encouraged, as a part of hypertension management. The importance of attaining a healthy weight is also discussed.  BP/Weight 11/28/2019 09/20/2019 09/19/2019 08/08/2019 05/16/2019 03/24/2019 6/94/8546  Systolic BP 270 350 093 818 299 371 696  Diastolic BP 95 80 90 91 74 78 78  Wt. (Lbs) 160 153.2 153.04 147.12 154.8 153.8 152.7  BMI 27.46 26.3 26.27 25.25 26.57 27.24 26.21

## 2019-11-30 NOTE — Assessment & Plan Note (Signed)
Flexeril up to 3 times daily as needed , for back spasm

## 2019-12-06 ENCOUNTER — Ambulatory Visit: Payer: BC Managed Care – PPO | Admitting: Orthopedic Surgery

## 2019-12-06 ENCOUNTER — Other Ambulatory Visit: Payer: Self-pay | Admitting: Orthopedic Surgery

## 2019-12-06 ENCOUNTER — Other Ambulatory Visit: Payer: Self-pay

## 2019-12-06 ENCOUNTER — Telehealth: Payer: Self-pay | Admitting: Radiology

## 2019-12-06 VITALS — BP 122/84 | HR 87 | Ht 63.0 in | Wt 155.0 lb

## 2019-12-06 DIAGNOSIS — M47816 Spondylosis without myelopathy or radiculopathy, lumbar region: Secondary | ICD-10-CM | POA: Diagnosis not present

## 2019-12-06 DIAGNOSIS — M545 Low back pain, unspecified: Secondary | ICD-10-CM

## 2019-12-06 MED ORDER — HYDROCODONE-ACETAMINOPHEN 5-325 MG PO TABS: 1.0000 | ORAL_TABLET | Freq: Four times a day (QID) | ORAL | 0 refills | Status: DC | PRN

## 2019-12-06 MED ORDER — HYDROCODONE-ACETAMINOPHEN 5-325 MG PO TABS: 1.0000 | ORAL_TABLET | Freq: Four times a day (QID) | ORAL | 0 refills | Status: AC | PRN

## 2019-12-06 NOTE — Progress Notes (Signed)
Patient ID: Faith Hood, female   DOB: 01-Jun-1955, 64 y.o.   MRN: [id number]  Chief Complaint  Patient presents with  . Back Pain    Back pain, referred by Dr. Moshe Cipro.    HPI Faith Hood is a 64 y.o. female. Faith Hood presents with 3-week history of severe back pain without any radicular symptoms. She says she does have intermittent numbness and tingling of both feet with comes and goes but most of her pain is in her lower back she did have some initial bilateral leg pain which resolved  She has no red flags at this time no history of cancer  The patient is already on the [facility] for a rheumatologic issue she took some Flexeril did not get any better x-ray showed L5-S1 and L4-5 facet arthritis and old T11 fracture  Primary care sent her for evaluation  Review of Systems Review of Systems  Constitutional: Negative for chills and fever.  Gastrointestinal: Negative for constipation.       Denies loss bowel control   Genitourinary:       Denies urinary retention or los of bladder control     Past Medical History:  Diagnosis Date  . Cerebral vascular disease    Carotid ultrasound in 10/2011: Mild plaque; tortuous vessels; no definite luminal obstruction.  . Hepatic steatosis    By CT scan  . Hyperlipidemia   . Hypertension   . Overweight(278.02)   . Uterine leiomyoma    By CT scan    Past Surgical History:  Procedure Laterality Date  . COLONOSCOPY  12/2007   Negative screening study  . COLONOSCOPY N/A 08/17/2018   Procedure: COLONOSCOPY;  Surgeon: Daneil Dolin, MD;  Location: [facility];  Service: Endoscopy;  Laterality: N/A;  12:00-rescheduled to 4/17 same time per Almyra Free  . DIAGNOSTIC LAPAROSCOPY  1978   Gynecologic problems    Family History  Problem Relation Age of Onset  . Pancreatic cancer Mother 42       Diagnosed in 2009; currently in hospice pt. htn  . Hypertension Mother        + Sister x4  . Cancer Father 34       brain tumor  . Hypertension Sister    . Hypertension Sister   . Hypertension Sister   . Stroke Sister   . Hypertension Sister   . Cancer Maternal Grandmother   . Cancer Paternal Grandmother   . Colon cancer Neg Hx    was reviewed  Social History Social History   Tobacco Use  . Smoking status: Never Smoker  . Smokeless tobacco: Never Used  Substance Use Topics  . Alcohol use: Not Currently    Comment: occasionally. About twice a year.   . Drug use: No    No Known Allergies  Current Outpatient Medications  Medication Sig Dispense Refill  . alendronate (FOSAMAX) 70 MG tablet Take 1 tablet (70 mg total) by mouth every 7 (seven) days. Take with a full glass of water on an empty stomach. 4 tablet 11  . amLODipine (NORVASC) 10 MG tablet Take 1 tablet (10 mg total) by mouth daily. 90 tablet 1  . amLODipine (NORVASC) 2.5 MG tablet Take 1 tablet (2.5 mg total) by mouth daily. 90 tablet 1  . aspirin EC 81 MG tablet Take 81 mg by mouth daily.    . Calcium Carbonate-Vitamin D (CALCIUM 500 + D PO) Take by mouth 2 (two) times daily.    . cloNIDine (CATAPRES) 0.3  MG tablet Take one tablet by mouth at bedtime for blood pressure 30 tablet 3  . cyclobenzaprine (FLEXERIL) 5 MG tablet Take 1 tablet (5 mg total) by mouth 3 (three) times daily as needed for muscle spasms. 30 tablet 1  . ergocalciferol (VITAMIN D2) 1.25 MG (50000 UT) capsule Take 1 capsule (50,000 Units total) by mouth once a week. One capsule once weekly 12 capsule 1  . losartan (COZAAR) 100 MG tablet Take 1 tablet (100 mg total) by mouth daily. 90 tablet 3  . predniSONE (DELTASONE) 5 MG tablet Take 5 mg by mouth in the morning and at bedtime.    Marland Kitchen PREDNISONE PO Take 20 mg by mouth in the morning and at bedtime. Pt takes 2 doses    . HYDROcodone-acetaminophen (NORCO/VICODIN) 5-325 MG tablet Take 1 tablet by mouth every 6 (six) hours as needed for moderate pain. 30 tablet 0   No current facility-administered medications for this visit.       Physical Exam BP  122/84   Pulse 87   Ht 5\' 3"  (1.6 m)   Wt 155 lb (70.3 kg)   BMI 27.46 kg/m  Physical Exam Gen. appearance: The patient is well-developed and well-nourished grooming and hygiene are normal The patient is oriented to person place and time The patient's mood is normal and the affect is normal  Ortho Exam Gait assessment: The patient stands with normal gait and station  Lumbar spine Tenderness  to palpation is noted in the lower L4-5 and 5 S1 segment  Range of motion normal Muscle tone normal muscle tone on the right and left sides of the spine  Lower extremities right and left Normal range of motion hip knee and ankle All 3 joints are reduced and stable  Strength right lower extremity graded 5 out of 5 Strength left lower extremity 5 out of 5  Neurologic right lower extremity examination  Reflexes were 2+ and equal at the knee and 1+ and equal at the ankle    Sensation was normal in both feet and legs    Babinski's tests were down going  Straight leg raise testing was negative normal bilaterally  The vascular examination revealed normal dorsalis pedis pulses in both feet and both feet were warm with good capillary refill    MEDICAL DECISION MAKING  A.  Encounter Diagnoses  Name Primary?  . Facet arthritis of lumbar region Yes  . Acute midline low back pain without sciatica     B. DATA ANALYSED:   IMAGING: Independent interpretation of images: Lumbar spine series x5 patient has an old T11 fracture with facet arthritis L4-S1  Orders: none  Outside records reviewed: none  C. MANAGEMENT   Patient will follow up in 3 months continue the Flexeril and the Deltasone  Meds ordered this encounter  Medications  . HYDROcodone-acetaminophen (NORCO/VICODIN) 5-325 MG tablet    Sig: Take 1 tablet by mouth every 6 (six) hours as needed for moderate pain.    Dispense:  30 tablet    Refill:  0

## 2019-12-06 NOTE — Telephone Encounter (Signed)
Have gotten a prior authorization for the Hydrocodone #30 called the pharmacy they can change to #28 and it will go through  I asked them to change to #28.   To you FYI

## 2019-12-06 NOTE — Patient Instructions (Addendum)
REST, NO LIFTING OR HOUSEWORK   HEAT APPLY TO BACK FOR 30 MIN 4 X A DAY   PAIN MEDICATION FOR THE BACK   CONTINUE THE MUSCLE RELAXER   FU IN 3 WEEKS

## 2019-12-26 ENCOUNTER — Encounter: Payer: Self-pay | Admitting: Family Medicine

## 2019-12-26 ENCOUNTER — Ambulatory Visit: Payer: BC Managed Care – PPO | Admitting: Family Medicine

## 2019-12-26 ENCOUNTER — Other Ambulatory Visit: Payer: Self-pay

## 2019-12-26 VITALS — BP 142/79 | HR 90 | Resp 16 | Ht 63.0 in | Wt 162.0 lb

## 2019-12-26 DIAGNOSIS — Z23 Encounter for immunization: Secondary | ICD-10-CM | POA: Diagnosis not present

## 2019-12-26 DIAGNOSIS — M62830 Muscle spasm of back: Secondary | ICD-10-CM | POA: Diagnosis not present

## 2019-12-26 DIAGNOSIS — M300 Polyarteritis nodosa: Secondary | ICD-10-CM | POA: Diagnosis not present

## 2019-12-26 DIAGNOSIS — I10 Essential (primary) hypertension: Secondary | ICD-10-CM

## 2019-12-26 DIAGNOSIS — E559 Vitamin D deficiency, unspecified: Secondary | ICD-10-CM

## 2019-12-26 DIAGNOSIS — E663 Overweight: Secondary | ICD-10-CM

## 2019-12-26 DIAGNOSIS — Z1231 Encounter for screening mammogram for malignant neoplasm of breast: Secondary | ICD-10-CM

## 2019-12-26 DIAGNOSIS — E038 Other specified hypothyroidism: Secondary | ICD-10-CM

## 2019-12-26 DIAGNOSIS — M5410 Radiculopathy, site unspecified: Secondary | ICD-10-CM

## 2019-12-26 DIAGNOSIS — E7849 Other hyperlipidemia: Secondary | ICD-10-CM

## 2019-12-26 MED ORDER — AMLODIPINE BESYLATE 5 MG PO TABS: 5.0000 mg | ORAL_TABLET | Freq: Every day | ORAL | 3 refills | Status: DC

## 2019-12-26 NOTE — Patient Instructions (Addendum)
F/iu IN OFFICE WITH Md IN 4.5 MONTHS, CALL IF YOU NEED ME SOONER, TDap AT VISIT  Please schedule mammogram at checkout  Excellent blood pressure , new is amlodipine 5 mg one at bedtime, stop breaking the 10 mg tablet in half, continue amlodipine 10 mg one daily   Fasting lipid, cmp and EGFr, TSH and vit D and CBC 1 week before next visit  It is important that you exercise regularly at least 30 minutes 5 times a week. If you develop chest pain, have severe difficulty breathing, or feel very tired, stop exercising immediately and seek medical attention  Think about what you will eat, plan ahead. Choose " clean, green, fresh or frozen" over canned, processed or packaged foods which are more sugary, salty and fatty. 70 to 75% of food eaten should be vegetables and fruit. Three meals at set times with snacks allowed between meals, but they must be fruit or vegetables. Aim to eat over a 12 hour period , example 7 am to 7 pm, and STOP after  your last meal of the day. Drink water,generally about 64 ounces per day, no other drink is as healthy. Fruit juice is best enjoyed in a healthy way, by EATING the fruit.   Thanks for choosing [facility], we consider it a privelige to serve you.

## 2019-12-26 NOTE — Progress Notes (Signed)
   Faith Hood     MRN: [id number]      DOB: 25-May-1955   HPI Faith Hood is here for follow up and re-evaluation of chronic medical conditions, medication management and review of any available recent lab and radiology data.  Preventive health is updated, specifically  Cancer screening and Immunization.   Back pain and spasm much improved Marked improvements in back pain and overall wellbeing The PT denies any adverse reactions to current medications since the last visit.  Tolerating prednisone taper, skin is improved on arms and slowly  improving on legs   ROS Denies recent fever or chills. Denies sinus pressure, nasal congestion, ear pain or sore throat. Denies chest congestion, productive cough or wheezing. Denies chest pains, palpitations and leg swelling Denies abdominal pain, nausea, vomiting,diarrhea or constipation.   Denies dysuria, frequency, hesitancy or incontinence. Denies uncontrolled joint pain, swelling and limitation in mobility. Denies headaches, seizures, numbness, or tingling. Denies depression, anxiety or insomnia.  PE  BP (!) 142/79   Pulse 90   Resp 16   Ht 5\' 3"  (1.6 m)   Wt 162 lb (73.5 kg)   SpO2 97%   BMI 28.70 kg/m   Patient alert and oriented and in no cardiopulmonary distress.  HEENT: No facial asymmetry, EOMI,     Neck supple .  Chest: Clear to auscultation bilaterally.  CVS: S1, S2 no murmurs, no S3.Regular rate.  ABD: Soft non tender.   Ext: No edema  MS: Adequate ROM spine, shoulders, hips and knees.  Skin: Intact, no ulcerations or rash noted.  Psych: Good eye contact, normal affect. Memory intact not anxious or depressed appearing.  CNS: CN 2-12 intact, power,  normal throughout.no focal deficits noted.   Assessment & Plan  Hypertension Marked improvem,ent , continue dosing as pt has been taking which is anlodipine 15 mg daily, did not fill the 2.5 mg tabet and was breaking the 10 mg in half Not bradycardic, no leg  swelling DASH diet and commitment to daily physical activity for a minimum of 30 minutes discussed and encouraged, as a part of hypertension management. The importance of attaining a healthy weight is also discussed.  BP/Weight 12/26/2019 12/06/2019 11/28/2019 09/20/2019 09/19/2019 08/08/2019 06/01/8117  Systolic BP 147 829 562 130 865 784 696  Diastolic BP 79 84 95 80 90 91 74  Wt. (Lbs) 162 155 160 153.2 153.04 147.12 154.8  BMI 28.7 27.46 27.46 26.3 26.27 25.25 26.57       Overweight  Patient re-educated about  the importance of commitment to a  minimum of 150 minutes of exercise per week as able.  The importance of healthy food choices with portion control discussed, as well as eating regularly and within a 12 hour window most days. The need to choose "clean , green" food 50 to 75% of the time is discussed, as well as to make water the primary drink and set a goal of 64 ounces water daily.    Weight /BMI 12/26/2019 12/06/2019 11/28/2019  WEIGHT 162 lb 155 lb 160 lb  HEIGHT 5\' 3"  5\' 3"  5\' 4"   BMI 28.7 kg/m2 27.46 kg/m2 27.46 kg/m2      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HCC) impproving and on prednisone taper, managed by Rheumatology, current dose is 10 mg, has upcoming appt  Spasm of muscle of lower back Improved with conservative management by Ortho

## 2019-12-26 NOTE — Assessment & Plan Note (Signed)
°  Patient re-educated about  the importance of commitment to a  minimum of 150 minutes of exercise per week as able.  The importance of healthy food choices with portion control discussed, as well as eating regularly and within a 12 hour window most days. The need to choose "clean , green" food 50 to 75% of the time is discussed, as well as to make water the primary drink and set a goal of 64 ounces water daily.    Weight /BMI 12/26/2019 12/06/2019 11/28/2019  WEIGHT 162 lb 155 lb 160 lb  HEIGHT 5\' 3"  5\' 3"  5\' 4"   BMI 28.7 kg/m2 27.46 kg/m2 27.46 kg/m2

## 2019-12-26 NOTE — Assessment & Plan Note (Signed)
Improved with conservative management by Ortho

## 2019-12-26 NOTE — Assessment & Plan Note (Signed)
impproving and on prednisone taper, managed by Rheumatology, current dose is 10 mg, has upcoming appt

## 2019-12-26 NOTE — Assessment & Plan Note (Signed)
Marked improvem,ent , continue dosing as pt has been taking which is anlodipine 15 mg daily, did not fill the 2.5 mg tabet and was breaking the 10 mg in half Not bradycardic, no leg swelling DASH diet and commitment to daily physical activity for a minimum of 30 minutes discussed and encouraged, as a part of hypertension management. The importance of attaining a healthy weight is also discussed.  BP/Weight 12/26/2019 12/06/2019 11/28/2019 09/20/2019 09/19/2019 08/08/2019 03/06/1622  Systolic BP 469 507 225 750 518 335 825  Diastolic BP 79 84 95 80 90 91 74  Wt. (Lbs) 162 155 160 153.2 153.04 147.12 154.8  BMI 28.7 27.46 27.46 26.3 26.27 25.25 26.57

## 2019-12-27 NOTE — Assessment & Plan Note (Signed)
Marked improvement with conservative mmanagement by Ortho

## 2019-12-27 NOTE — Assessment & Plan Note (Signed)
Hyperlipidemia:Low fat diet discussed and encouraged.   Lipid Panel  Lab Results  Component Value Date   CHOL 239 (H) 09/14/2019   HDL 71 09/14/2019   LDLCALC 143 (H) 09/14/2019   TRIG 128 09/14/2019   CHOLHDL 3.4 09/14/2019   neds to reduce fried and fatty foods

## 2019-12-28 ENCOUNTER — Other Ambulatory Visit: Payer: Self-pay

## 2019-12-28 ENCOUNTER — Encounter: Payer: Self-pay | Admitting: Orthopedic Surgery

## 2019-12-28 ENCOUNTER — Ambulatory Visit (INDEPENDENT_AMBULATORY_CARE_PROVIDER_SITE_OTHER): Payer: BC Managed Care – PPO | Admitting: Orthopedic Surgery

## 2019-12-28 ENCOUNTER — Ambulatory Visit: Payer: BC Managed Care – PPO | Admitting: Family Medicine

## 2019-12-28 VITALS — BP 154/104 | HR 102 | Ht 63.0 in | Wt 158.0 lb

## 2019-12-28 DIAGNOSIS — M47816 Spondylosis without myelopathy or radiculopathy, lumbar region: Secondary | ICD-10-CM

## 2019-12-28 DIAGNOSIS — M62838 Other muscle spasm: Secondary | ICD-10-CM

## 2019-12-28 MED ORDER — DIAZEPAM 5 MG PO TABS: 5.0000 mg | ORAL_TABLET | Freq: Every evening | ORAL | 0 refills | Status: DC

## 2019-12-28 NOTE — Progress Notes (Signed)
No lower back pain just upper back  Some groin pain a ;little no leg pain

## 2019-12-28 NOTE — Progress Notes (Signed)
Chief Complaint  Patient presents with  . Back Pain    mid to lower back some better but having muscle spasms       Back pain, referred by Dr. Moshe Cipro.      HPI Faith Hood is a 64 y.o. female. Ms. Pellecchia presents with 3-week history of severe back pain without any radicular symptoms. She says she does have intermittent numbness and tingling of both feet with comes and goes but most of her pain is in her lower back she did have some initial bilateral leg pain which resolved   She has no red flags at this time no history of cancer   The patient is already on the [facility] for a rheumatologic issue she took some Flexeril did not get any better x-ray showed L5-S1 and L4-5 facet arthritis and old T11 fracture   Primary care sent her for evaluation  Today's visit.  Patient comes back in after a course of hydrocodone for pain along with Flexeril and Deltasone.  Most of her lower back pain is gone she is having some upper right-sided back pain  BP (!) 154/104   Pulse (!) 102   Ht 5\' 3"  (1.6 m)   Wt 158 lb (71.7 kg)   BMI 27.99 kg/m   Nontender lower back lower extremities normal mild tenderness right upper back with increased muscle tension  Recommend hold Flexeril for couple days take some diazepam in the emergency room heating pad ibuprofen and Flexeril   Meds ordered this encounter  Medications  . diazepam (VALIUM) 5 MG tablet    Sig: Take 1 tablet (5 mg total) by mouth at bedtime.    Dispense:  5 tablet    Refill:  0

## 2019-12-28 NOTE — Patient Instructions (Addendum)
Stop flexeril   Continue the heating pad   Take diazepam for 5 days then resume flexeril

## 2020-01-20 NOTE — Progress Notes (Signed)
Referring Provider: Fayrene Helper, MD Primary Care Physician:  Fayrene Helper, MD Primary GI Physician: Dr. Gala Romney  Chief Complaint  Patient presents with  . Elevated Hepatic Enzymes    HPI:   Faith Hood is a 64 y.o. female presenting today with a history of elevated LFTs since 2018. Also  previously noted nausea without vomiting worsened by breads or sugar with associated few pound weight loss in November 2020. Celiac screen negative. HIDA in December 2020 normal. Symptoms improved with dietary changes.  TCS up to date in June 2020, due for repeat in 2030.Decreased bowel frequency in early 2021 that resolved with dietary changes.   Evaluation of elevated LFTs included negative acute hepatitis panel, ANA negative, AMA negative, ASMA elevated at 24, IgG elevated at 1591, IgA elevated at 430, ceruloplasmin within normal limits, no evidence of alpha 1 antitrypsin deficiency.  Iron panel with ferritin 430H.  Hemochromatosis labs revealed H63D heterozygosity.  She had follow-up with hematology with repeat ferritin back to normal. Fatty liver on CT in 2009, ultrasound in November 2020 within normal limits. 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 Received and reviewed rheumatology note from May and June 2021. Patient was initially started on 60 mg prednisone daily, tapered to 40 mg after 4 weeks with plans to taper by 5 mg q2 weeks. Improvement  in LFTs in July 2021: AST 21, ALT 41 (H), Alk phos 140.   Today:   Last saw Rheumatology in September. Currently on prednisone 5 mg daily. Plans for last dose to be around December 10th. Rash has resolved on arms. Rash seems to be improving on the legs.  Had a bone density scan with rheumatology with some osteoporosis. Taking calcium, vitamin D, and Fosamax.   Elevated LFTs: Mild swelling in lower extremities since taking amlodipine. Notices this during the day. No swelling in abdomen. No  yellowing of the eyes or confusion. Feels she bruises easily. No bleeding.  No alcohol, drug use, or significant Tylenol use.  BMs twice a week.  Stools can be on the harder side at times. Depends on what she eats. Eats plenty of fruits and vegetables. Drinks plenty of water. No abdominal pain. No blood in the stools or black stools. No GERD symptoms, dysphagia, nausea or vomiting.   Past Medical History:  Diagnosis Date  . Cerebral vascular disease    Carotid ultrasound in 10/2011: Mild plaque; tortuous vessels; no definite luminal obstruction.  . Hepatic steatosis    By CT scan  . Hyperlipidemia   . Hypertension   . Overweight(278.02)   . 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 Uterine leiomyoma    By CT scan    Past Surgical History:  Procedure Laterality Date  . COLONOSCOPY  12/2007   Negative screening study  . COLONOSCOPY N/A 08/17/2018   Procedure: COLONOSCOPY;  Surgeon: Daneil Dolin, MD; normal exam.  Repeat in 2030.  Marland Kitchen DIAGNOSTIC LAPAROSCOPY  1978   Gynecologic problems    Current Outpatient Medications  Medication Sig Dispense Refill  . alendronate (FOSAMAX) 70 MG tablet Take 1 tablet (70 mg total) by mouth every 7 (seven) days. Take with a full glass of water on an empty stomach. 4 tablet 11  . amLODipine (NORVASC) 10 MG tablet Take 1 tablet (10 mg total) by mouth daily. 90 tablet 1  . amLODipine (NORVASC) 5 MG tablet Take 1 tablet (5 mg total) by mouth daily. TAKE ONE  TABLET BY MOUTH DAILY EVERY EVENING 90 tablet 3  . Calcium Carbonate-Vitamin D (CALCIUM 500 + D PO) Take by mouth 2 (two) times daily.    . cloNIDine (CATAPRES) 0.3 MG tablet Take one tablet by mouth at bedtime for blood pressure 30 tablet 3  . cyclobenzaprine (FLEXERIL) 5 MG tablet Take 1 tablet (5 mg total) by mouth 3 (three) times daily as needed for muscle spasms. 30 tablet 1  . ergocalciferol (VITAMIN D2) 1.25 MG (50000 UT) capsule Take 1 capsule (50,000 Units total) by mouth once a week. One capsule once  weekly 12 capsule 1  . losartan (COZAAR) 100 MG tablet Take 1 tablet (100 mg total) by mouth daily. 90 tablet 3  . predniSONE (DELTASONE) 10 MG tablet Take 5 mg by mouth daily with breakfast.  28 tablet    No current facility-administered medications for this visit.    Allergies as of 01/22/2020  . (No Known Allergies)    Family History  Problem Relation Age of Onset  . Pancreatic cancer Mother 31       Diagnosed in 2009; currently in hospice pt. htn  . Hypertension Mother        + Sister x4  . Cancer Father 41       brain tumor  . Hypertension Sister   . Hypertension Sister   . Hypertension Sister   . Stroke Sister   . Hypertension Sister   . Cancer Maternal Grandmother   . Cancer Paternal Grandmother   . Colon cancer Neg Hx     Social History   Socioeconomic History  . Marital status: Single    Spouse name: Not on file  . Number of children: 0  . Years of education: Not on file  . Highest education level: Not on file  Occupational History  . 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 Smoking status: Never Smoker  . Smokeless tobacco: Never Used  Substance and Sexual Activity  . Alcohol use: Not Currently    Comment: occasionally. About twice a year.   . Drug use: No  . Sexual activity: Not Currently  Other Topics Concern  . Not on file  Social History Narrative  . Not on file   Social Determinants of Health   Financial Resource Strain:   . Difficulty of Paying Living Expenses: Not on file  Food Insecurity:   . Worried About Charity fundraiser in the Last Year: Not on file  . Ran Out of Food in the Last Year: Not on file  Transportation Needs:   . Lack of Transportation (Medical): Not on file  . Lack of Transportation (Non-Medical): Not on file  Physical Activity:   . Days of Exercise per Week: Not on file  . Minutes of Exercise per Session: Not on file  Stress:   . Feeling of Stress : Not on file  Social  Connections:   . Frequency of Communication with Friends and Family: Not on file  . Frequency of Social Gatherings with Friends and Family: Not on file  . Attends Religious Services: Not on file  . Active Member of Clubs or Organizations: Not on file  . Attends Archivist Meetings: Not on file  . Marital Status: Not on file    Review of Systems: Gen: Denies fever, chills, cold or flulike symptoms, lightheadedness, dizziness, presyncope, syncope. CV: Denies chest pain, or palpitations. Resp: Denies dyspnea or cough. GI: See HPI  Heme: See HPI  Physical Exam: BP 131/84   Pulse 99   Temp 98.2 F (36.8 C) (Temporal)   Ht '5\' 3"'  (1.6 m)   Wt 161 lb 12.8 oz (73.4 kg)   BMI 28.66 kg/m  General:   Alert and oriented. No distress noted. Pleasant and cooperative.  Head:  Normocephalic and atraumatic. Eyes:  Conjuctiva clear without scleral icterus. Heart:  S1, S2 present without murmurs appreciated. Lungs:  Clear to auscultation bilaterally. No wheezes, rales, or rhonchi. No distress.  Abdomen:  +BS, soft, non-tender and non-distended. No rebound or guarding. No HSM or masses noted. Msk:  Symmetrical without gross deformities. Normal posture. Extremities:  With trace bilateral lower extremity pitting edema. Neurologic:  Alert and  oriented x4 Psych: Normal mood and affect.

## 2020-01-22 ENCOUNTER — Ambulatory Visit: Payer: BC Managed Care – PPO | Admitting: Gastroenterology

## 2020-01-22 ENCOUNTER — Telehealth: Payer: Self-pay | Admitting: Internal Medicine

## 2020-01-22 ENCOUNTER — Encounter: Payer: Self-pay | Admitting: Gastroenterology

## 2020-01-22 ENCOUNTER — Other Ambulatory Visit: Payer: Self-pay

## 2020-01-22 VITALS — BP 131/84 | HR 99 | Temp 98.2°F | Ht 63.0 in | Wt 161.8 lb

## 2020-01-22 DIAGNOSIS — R194 Change in bowel habit: Secondary | ICD-10-CM

## 2020-01-22 DIAGNOSIS — R7989 Other specified abnormal findings of blood chemistry: Secondary | ICD-10-CM

## 2020-01-22 LAB — HEPATIC FUNCTION PANEL
AG Ratio: 1.6 (calc) (ref 1.0–2.5)
ALT: 18 U/L (ref 6–29)
AST: 19 U/L (ref 10–35)
Albumin: 4.2 g/dL (ref 3.6–5.1)
Alkaline phosphatase (APISO): 89 U/L (ref 37–153)
Bilirubin, Direct: 0.1 mg/dL (ref 0.0–0.2)
Globulin: 2.6 g/dL (calc) (ref 1.9–3.7)
Indirect Bilirubin: 0.5 mg/dL (calc) (ref 0.2–1.2)
Total Bilirubin: 0.6 mg/dL (ref 0.2–1.2)
Total Protein: 6.8 g/dL (ref 6.1–8.1)

## 2020-01-22 NOTE — Telephone Encounter (Signed)
Noted. Thanks.

## 2020-01-22 NOTE — Telephone Encounter (Signed)
Patient called to let you know that the Amlodipine was 15 mg total.  She said this was something you had discussed

## 2020-01-22 NOTE — Assessment & Plan Note (Signed)
Mild constipation previously improved with increasing fruits and vegetables; however, mild constipation has returned with BMs about twice weekly.  Occasional hard stools.  No alarm symptoms.  Colonoscopy up-to-date in June 2020, due for repeat in 2030.  Plan: Start Benefiber or Metamucil daily. If daily fiber supplement does not satisfactorily improve bowel regularity, add MiraLAX 1 capful (17 g) daily in 8 ounces of water. Follow-up in 6 months.  Call with questions or concerns prior.

## 2020-01-22 NOTE — Assessment & Plan Note (Signed)
64 year old female with history of HTN, HLD, fatty liver on CT in 2009, mildly elevated LFTs since November 2018 with prior work-up including CBC with normal platelets, acute hepatitis panel negative, INR normal, celiac screen negative, ANA negative, AMA negative, ASMA elevated at 24, IgG elevated at 1591, IgA elevated at 430, ceruloplasmin within normal limits, no evidence of alpha-1 antitrypsin deficiency, iron panel with ferritin 430, hemochromatosis DNA with H63D heterozygosity, ferritin later normalized, ultrasound November 2020 within normal limits, MRI with unremarkable liver.  Had discussed possibility of liver biopsy previously.  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  At her last visit in July 2021, LFTs had improved with  AST 21, ALT 41 (H), Alk phos 140., which is the lowest LFTs have been in quite some time. She has no signs or symptoms of advanced liver disease and is currently on prednisone 5 mg daily with plans for last dose to be around December 10.  Query whether elevated LFTs may have been secondary to polyarteritis nodosa. With elevated ASMA and mild elevation of IgG, can't rule out coexisting autoimmune hepatitis.   We will plan to repeat HFP at this time. Further recommendations to follow.

## 2020-01-22 NOTE — Patient Instructions (Signed)
Please have labs completed at [facility] or Metamucil daily.  If addition of fiber supplement daily does not improve bowel regularity, you may also add MiraLAX 1 capful (17 g) daily in 8 ounces of water.  We will call you with lab results and plan to follow-up with you in the office in 6 months.  Do not hesitate to call if you have questions or concerns prior.  It was great to see you today!  I hope you have a great Christmas!  Aliene Altes, PA-C [facility]

## 2020-01-23 ENCOUNTER — Other Ambulatory Visit: Payer: Self-pay

## 2020-01-23 DIAGNOSIS — R7989 Other specified abnormal findings of blood chemistry: Secondary | ICD-10-CM

## 2020-02-05 ENCOUNTER — Other Ambulatory Visit: Payer: Self-pay

## 2020-02-05 DIAGNOSIS — R7989 Other specified abnormal findings of blood chemistry: Secondary | ICD-10-CM

## 2020-02-28 ENCOUNTER — Ambulatory Visit (HOSPITAL_COMMUNITY): Payer: BC Managed Care – PPO

## 2020-03-08 ENCOUNTER — Other Ambulatory Visit: Payer: Self-pay

## 2020-03-08 ENCOUNTER — Ambulatory Visit (HOSPITAL_COMMUNITY)
Admission: RE | Admit: 2020-03-08 | Discharge: 2020-03-08 | Disposition: A | Discharge status: Home / Self Care | Payer: BC Managed Care – PPO | Source: Ambulatory Visit | Attending: Family Medicine | Admitting: Family Medicine

## 2020-03-08 DIAGNOSIS — Z1231 Encounter for screening mammogram for malignant neoplasm of breast: Secondary | ICD-10-CM | POA: Diagnosis not present

## 2020-05-02 LAB — CMP14+EGFR
ALT: 35 IU/L — ABNORMAL HIGH (ref 0–32)
AST: 52 IU/L — ABNORMAL HIGH (ref 0–40)
Albumin/Globulin Ratio: 1.6 (ref 1.2–2.2)
Albumin: 4.7 g/dL (ref 3.8–4.8)
Alkaline Phosphatase: 90 IU/L (ref 44–121)
BUN/Creatinine Ratio: 9 — ABNORMAL LOW (ref 12–28)
BUN: 8 mg/dL (ref 8–27)
Bilirubin Total: 0.6 mg/dL (ref 0.0–1.2)
CO2: 21 mmol/L (ref 20–29)
Calcium: 9.9 mg/dL (ref 8.7–10.3)
Chloride: 104 mmol/L (ref 96–106)
Creatinine, Ser: 0.86 mg/dL (ref 0.57–1.00)
Globulin, Total: 3 g/dL (ref 1.5–4.5)
Glucose: 88 mg/dL (ref 65–99)
Potassium: 3.9 mmol/L (ref 3.5–5.2)
Sodium: 141 mmol/L (ref 134–144)
Total Protein: 7.7 g/dL (ref 6.0–8.5)
eGFR: 76 mL/min/{1.73_m2} (ref >59)

## 2020-05-02 LAB — LIPID PANEL
Chol/HDL Ratio: 6.1 ratio — ABNORMAL HIGH (ref 0.0–4.4)
Cholesterol, Total: 206 mg/dL — ABNORMAL HIGH (ref 100–199)
HDL: 34 mg/dL — ABNORMAL LOW (ref >39)
LDL Chol Calc (NIH): 147 mg/dL — ABNORMAL HIGH (ref 0–99)
Triglycerides: 139 mg/dL (ref 0–149)
VLDL Cholesterol Cal: 25 mg/dL (ref 5–40)

## 2020-05-02 LAB — CBC
Hematocrit: 41 % (ref 34.0–46.6)
Hemoglobin: 13.2 g/dL (ref 11.1–15.9)
MCH: 27.3 pg (ref 26.6–33.0)
MCHC: 32.2 g/dL (ref 31.5–35.7)
MCV: 85 fL (ref 79–97)
Platelets: 323 10*3/uL (ref 150–450)
RBC: 4.83 x10E6/uL (ref 3.77–5.28)
RDW: 16.3 % — ABNORMAL HIGH (ref 11.7–15.4)
WBC: 7 10*3/uL (ref 3.4–10.8)

## 2020-05-02 LAB — TSH: TSH: 42.5 u[IU]/mL — ABNORMAL HIGH (ref 0.450–4.500)

## 2020-05-02 LAB — VITAMIN D 25 HYDROXY (VIT D DEFICIENCY, FRACTURES): Vit D, 25-Hydroxy: 62.8 ng/mL (ref 30.0–100.0)

## 2020-05-08 ENCOUNTER — Encounter: Payer: Self-pay | Admitting: Nurse Practitioner

## 2020-05-08 ENCOUNTER — Ambulatory Visit: Payer: BC Managed Care – PPO | Admitting: Nurse Practitioner

## 2020-05-08 ENCOUNTER — Other Ambulatory Visit: Payer: Self-pay

## 2020-05-08 VITALS — BP 149/90 | HR 98 | Temp 98.7°F | Ht 63.0 in | Wt 145.0 lb

## 2020-05-08 DIAGNOSIS — R7401 Elevation of levels of liver transaminase levels: Secondary | ICD-10-CM | POA: Diagnosis not present

## 2020-05-08 DIAGNOSIS — E7849 Other hyperlipidemia: Secondary | ICD-10-CM

## 2020-05-08 DIAGNOSIS — Z23 Encounter for immunization: Secondary | ICD-10-CM | POA: Diagnosis not present

## 2020-05-08 DIAGNOSIS — E039 Hypothyroidism, unspecified: Secondary | ICD-10-CM | POA: Diagnosis not present

## 2020-05-08 MED ORDER — LEVOTHYROXINE SODIUM 50 MCG PO TABS: 50.0000 ug | ORAL_TABLET | Freq: Every day | ORAL | 1 refills | Status: DC

## 2020-05-08 MED ORDER — ATORVASTATIN CALCIUM 20 MG PO TABS: 20.0000 mg | ORAL_TABLET | Freq: Every day | ORAL | 3 refills | Status: DC

## 2020-05-08 NOTE — Progress Notes (Signed)
Established Patient Office Visit  Subjective:  Patient ID: Faith Hood, female    DOB: [birth year]  Age: 65 y.o. MRN: [id number]  CC:  Chief Complaint  Patient presents with  . Hypertension    F/U    HPI Faith Hood presents for lab follow-up and BP check. She would like TDaP immunization.  She has HTN, and has been taking amlodipine. Not sure of the dose as there is a 5 mg and 10 mg tablet on her med list. She states he is taking 15 mg per day of amlodipine per day. She recently stopped taking prednisone.  She has hx of hypothyroidism, but has not been taking any medication for this. TSH today is 42.5, so significantly elevated.   Past Medical History:  Diagnosis Date  . Cerebral vascular disease    Carotid ultrasound in 10/2011: Mild plaque; tortuous vessels; no definite luminal obstruction.  . Hepatic steatosis    By CT scan  . Hyperlipidemia   . Hypertension   . Overweight(278.02)   . 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 Uterine leiomyoma    By CT scan    Past Surgical History:  Procedure Laterality Date  . COLONOSCOPY  12/2007   Negative screening study  . COLONOSCOPY N/A 08/17/2018   Procedure: COLONOSCOPY;  Surgeon: Daneil Dolin, MD; normal exam.  Repeat in 2030.  Marland Kitchen DIAGNOSTIC LAPAROSCOPY  1978   Gynecologic problems    Family History  Problem Relation Age of Onset  . Pancreatic cancer Mother 22       Diagnosed in 2009; currently in hospice pt. htn  . Hypertension Mother        + Sister x4  . Cancer Father 58       brain tumor  . Hypertension Sister   . Hypertension Sister   . Hypertension Sister   . Stroke Sister   . Hypertension Sister   . Cancer Maternal Grandmother   . Cancer Paternal Grandmother   . Colon cancer Neg Hx     Social History   Socioeconomic History  . Marital status: Single    Spouse name: Not on file  . Number of children: 0  . Years of education: Not on file  . Highest education level: Not on file  Occupational History  .  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 Smoking status: Never Smoker  . Smokeless tobacco: Never Used  Substance and Sexual Activity  . Alcohol use: Not Currently    Comment: occasionally. About twice a year.   . Drug use: No  . Sexual activity: Not Currently  Other Topics Concern  . Not on file  Social History Narrative  . Not on file   Social Determinants of Health   Financial Resource Strain: Not on file  Food Insecurity: Not on file  Transportation Needs: Not on file  Physical Activity: Not on file  Stress: Not on file  Social Connections: Not on file  Intimate Partner Violence: Not on file    Outpatient Medications Prior to Visit  Medication Sig Dispense Refill  . alendronate (FOSAMAX) 70 MG tablet Take 1 tablet (70 mg total) by mouth every 7 (seven) days. Take with a full glass of water on an empty stomach. 4 tablet 11  . amLODipine (NORVASC) 10 MG tablet Take 1 tablet (10 mg total) by mouth daily. 90 tablet 1  . amLODipine (NORVASC) 5 MG tablet Take 1 tablet (5 mg  total) by mouth daily. TAKE ONE TABLET BY MOUTH DAILY EVERY EVENING 90 tablet 3  . Calcium Carbonate-Vitamin D (CALCIUM 500 + D PO) Take by mouth 2 (two) times daily.    . cloNIDine (CATAPRES) 0.3 MG tablet Take one tablet by mouth at bedtime for blood pressure 30 tablet 3  . cyclobenzaprine (FLEXERIL) 5 MG tablet Take 1 tablet (5 mg total) by mouth 3 (three) times daily as needed for muscle spasms. 30 tablet 1  . ergocalciferol (VITAMIN D2) 1.25 MG (50000 UT) capsule Take 1 capsule (50,000 Units total) by mouth once a week. One capsule once weekly 12 capsule 1  . losartan (COZAAR) 100 MG tablet Take 1 tablet (100 mg total) by mouth daily. 90 tablet 3  . predniSONE (DELTASONE) 10 MG tablet Take 5 mg by mouth daily with breakfast.  28 tablet    No facility-administered medications prior to visit.    No Known Allergies  ROS Review of Systems  Constitutional: Positive  for fatigue. Negative for chills and fever.  HENT: Negative.   Respiratory: Negative.   Cardiovascular: Negative.   Psychiatric/Behavioral: Negative.       Objective:    Physical Exam Constitutional:      Appearance: Normal appearance.  Cardiovascular:     Rate and Rhythm: Normal rate and regular rhythm.     Pulses: Normal pulses.     Heart sounds: Normal heart sounds.  Pulmonary:     Effort: Pulmonary effort is normal.     Breath sounds: Normal breath sounds.  Neurological:     Mental Status: She is alert.  Psychiatric:        Mood and Affect: Mood normal.        Behavior: Behavior normal.        Thought Content: Thought content normal.        Judgment: Judgment normal.     BP (!) 149/90 (BP Location: Left Arm, Patient Position: Sitting, Cuff Size: Normal)   Pulse 98   Temp 98.7 F (37.1 C) (Oral)   Ht _0  (1.6 m)   Wt 145 lb (65.8 kg)   SpO2 98%   BMI 25.69 kg/m  Wt Readings from Last 3 Encounters:  05/08/20 145 lb (65.8 kg)  01/22/20 161 lb 12.8 oz (73.4 kg)  12/28/19 158 lb (71.7 kg)     Health Maintenance Due  Topic Date Due  . TETANUS/TDAP  12/18/2019    There are no preventive care reminders to display for this patient.  Lab Results  Component Value Date   TSH 42.500 (H) 05/01/2020   Lab Results  Component Value Date   WBC 7.0 05/01/2020   HGB 13.2 05/01/2020   HCT 41.0 05/01/2020   MCV 85 05/01/2020   PLT 323 05/01/2020   Lab Results  Component Value Date   NA 141 05/01/2020   K 3.9 05/01/2020   CO2 21 05/01/2020   GLUCOSE 88 05/01/2020   BUN 8 05/01/2020   CREATININE 0.86 05/01/2020   BILITOT 0.6 05/01/2020   ALKPHOS 90 05/01/2020   AST 52 (H) 05/01/2020   ALT 35 (H) 05/01/2020   PROT 7.7 05/01/2020   ALBUMIN 4.7 05/01/2020   CALCIUM 9.9 05/01/2020   Lab Results  Component Value Date   CHOL 206 (H) 05/01/2020   Lab Results  Component Value Date   HDL 34 (L) 05/01/2020   Lab Results  Component Value Date   LDLCALC  147 (H) 05/01/2020   Lab Results  Component Value  Date   TRIG 139 05/01/2020   Lab Results  Component Value Date   CHOLHDL 6.1 (H) 05/01/2020   Lab Results  Component Value Date   HGBA1C 5.5 06/23/2018      Assessment & Plan:   Problem List Items Addressed This Visit      Endocrine   Hypothyroidism - Primary    Lab Results  Component Value Date   TSH 42.500 (H) 05/01/2020   T3TOTAL 145.3 03/31/2014   -Rx. Levothyroxine       Relevant Medications   levothyroxine (SYNTHROID) 50 MCG tablet   Other Relevant Orders   TSH + free T4     Other   Hyperlipidemia    Lab Results  Component Value Date   CHOL 206 (H) 05/01/2020   HDL 34 (L) 05/01/2020   LDLCALC 147 (H) 05/01/2020   TRIG 139 05/01/2020   CHOLHDL 6.1 (H) 05/01/2020   -LDL elevated -Rx. Atorvastatin -preventing high cholesterol handout provided      Relevant Medications   atorvastatin (LIPITOR) 20 MG tablet   Transaminitis    -has hx of fatty liver Lab Results  Component Value Date   ALT 35 (H) 05/01/2020   AST 52 (H) 05/01/2020   ALKPHOS 90 05/01/2020   BILITOT 0.6 05/01/2020   -HLD may be contributory -Rx. atorvastatin      Relevant Orders   CMP14+EGFR   Immunization due    -TDaP administered today         Meds ordered this encounter  Medications  . levothyroxine (SYNTHROID) 50 MCG tablet    Sig: Take 1 tablet (50 mcg total) by mouth daily.    Dispense:  90 tablet    Refill:  1  . atorvastatin (LIPITOR) 20 MG tablet    Sig: Take 1 tablet (20 mg total) by mouth daily.    Dispense:  90 tablet    Refill:  3    Follow-up: Return in about 1 month (around 06/08/2020) for Lab follow-up (thyroid).    Noreene Larsson, NP

## 2020-05-08 NOTE — Assessment & Plan Note (Signed)
Lab Results  Component Value Date   TSH 42.500 (H) 05/01/2020   T3TOTAL 145.3 03/31/2014   -Rx. Levothyroxine

## 2020-05-08 NOTE — Assessment & Plan Note (Signed)
-  has hx of fatty liver Lab Results  Component Value Date   ALT 35 (H) 05/01/2020   AST 52 (H) 05/01/2020   ALKPHOS 90 05/01/2020   BILITOT 0.6 05/01/2020   -HLD may be contributory -Rx. atorvastatin

## 2020-05-08 NOTE — Addendum Note (Signed)
Addended by: Laretta Bolster on: 05/08/2020 09:42 AM   Modules accepted: Orders

## 2020-05-08 NOTE — Assessment & Plan Note (Addendum)
Lab Results  Component Value Date   CHOL 206 (H) 05/01/2020   HDL 34 (L) 05/01/2020   LDLCALC 147 (H) 05/01/2020   TRIG 139 05/01/2020   CHOLHDL 6.1 (H) 05/01/2020   -LDL elevated -Rx. Atorvastatin -preventing high cholesterol handout provided

## 2020-05-08 NOTE — Assessment & Plan Note (Signed)
-  TDaP administered today 

## 2020-05-08 NOTE — Patient Instructions (Addendum)
We will draw labs in 1 month to check your thyroid panel.    Preventing High Cholesterol Cholesterol is a white, waxy substance similar to fat that the human body needs to help build cells. The liver makes all the cholesterol that a person's body needs. Having high cholesterol (hypercholesterolemia) increases your risk for heart disease and stroke. Extra or excess cholesterol comes from the food that you eat. High cholesterol can often be prevented with diet and lifestyle changes. If you already have high cholesterol, you can control it with diet, lifestyle changes, and medicines. How can high cholesterol affect me? If you have high cholesterol, fatty deposits (plaques) may build up on the walls of your blood vessels. The blood vessels that carry blood away from your heart are called arteries. Plaques make the arteries narrower and stiffer. This in turn can:  Restrict or block blood flow and cause blood clots to form.  Increase your risk for heart attack and stroke. What can increase my risk for high cholesterol? This condition is more likely to develop in people who:  Eat foods that are high in saturated fat or cholesterol. Saturated fat is mostly found in foods that come from animal sources.  Are overweight.  Are not getting enough exercise.  Have a family history of high cholesterol (familial hypercholesterolemia). What actions can I take to prevent this? Nutrition  Eat less saturated fat.  Avoid trans fats (partially hydrogenated oils). These are often found in margarine and in some baked goods, fried foods, and snacks bought in packages.  Avoid precooked or cured meat, such as bacon, sausages, or meat loaves.  Avoid foods and drinks that have added sugars.  Eat more fruits, vegetables, and whole grains.  Choose healthy sources of protein, such as fish, poultry, lean cuts of red meat, beans, peas, lentils, and nuts.  Choose healthy sources of fat, such  as: ? Nuts. ? Vegetable oils, especially olive oil. ? Fish that have healthy fats, such as omega-3 fatty acids. These fish include mackerel or salmon.   Lifestyle  Lose weight if you are overweight. Maintaining a healthy body mass index (BMI) can help prevent or control high cholesterol. It can also lower your risk for diabetes and high blood pressure. Ask your health care provider to help you with a diet and exercise plan to lose weight safely.  Do not use any products that contain nicotine or tobacco, such as cigarettes, e-cigarettes, and chewing tobacco. If you need help quitting, ask your health care provider. Alcohol use  Do not drink alcohol if: ? Your health care provider tells you not to drink. ? You are pregnant, may be pregnant, or are planning to become pregnant.  If you drink alcohol: ? Limit how much you use to:  0-1 drink a day for women.  0-2 drinks a day for men. ? Be aware of how much alcohol is in your drink. In the [place] one drink equals one 12 oz bottle of beer (355 mL), one 5 oz glass of wine (148 mL), or one 1 oz glass of hard liquor (44 mL). Activity  Get enough exercise. Do exercises as told by your health care provider.  Each week, do at least 150 minutes of exercise that takes a medium level of effort (moderate-intensity exercise). This kind of exercise: ? Makes your heart beat faster while allowing you to still be able to talk. ? Can be done in short sessions several times a day or longer sessions a few times  a week. For example, on 5 days each week, you could walk fast or ride your bike 3 times a day for 10 minutes each time.   Medicines  Your health care provider may recommend medicines to help lower cholesterol. This may be a medicine to lower the amount of cholesterol that your liver makes. You may need medicine if: ? Diet and lifestyle changes have not lowered your cholesterol enough. ? You have high cholesterol and other risk factors for heart  disease or stroke.  Take over-the-counter and prescription medicines only as told by your health care provider. General information  Manage your risk factors for high cholesterol. Talk with your health care provider about all your risk factors and how to lower your risk.  Manage other conditions that you have, such as diabetes or high blood pressure (hypertension).  Have blood tests to check your cholesterol levels at regular points in time as told by your health care provider.  Keep all follow-up visits as told by your health care provider. This is important. Where to find more information  American Heart Association: www.heart.org  National Heart, Lung, and Blood Institute: https://[place].com/ Summary  High cholesterol increases your risk for heart disease and stroke. By keeping your cholesterol level low, you can reduce your risk for these conditions.  High cholesterol can often be prevented with diet and lifestyle changes.  Work with your health care provider to manage your risk factors, and have your blood tested regularly. This information is not intended to replace advice given to you by your health care provider. Make sure you discuss any questions you have with your health care provider. Document Revised: 11/22/2018 Document Reviewed: 11/22/2018 Elsevier Patient Education  [place].

## 2020-06-04 ENCOUNTER — Other Ambulatory Visit: Payer: Self-pay

## 2020-06-04 DIAGNOSIS — E7849 Other hyperlipidemia: Secondary | ICD-10-CM

## 2020-06-04 MED ORDER — CLONIDINE HCL 0.3 MG PO TABS: ORAL_TABLET | ORAL | 3 refills | Status: DC

## 2020-06-11 LAB — CMP14+EGFR
ALT: 40 IU/L — ABNORMAL HIGH (ref 0–32)
AST: 45 IU/L — ABNORMAL HIGH (ref 0–40)
Albumin/Globulin Ratio: 1.5 (ref 1.2–2.2)
Albumin: 4.3 g/dL (ref 3.8–4.8)
Alkaline Phosphatase: 81 IU/L (ref 44–121)
BUN/Creatinine Ratio: 9 — ABNORMAL LOW (ref 12–28)
BUN: 7 mg/dL — ABNORMAL LOW (ref 8–27)
Bilirubin Total: 0.5 mg/dL (ref 0.0–1.2)
CO2: 21 mmol/L (ref 20–29)
Calcium: 10 mg/dL (ref 8.7–10.3)
Chloride: 106 mmol/L (ref 96–106)
Creatinine, Ser: 0.74 mg/dL (ref 0.57–1.00)
Globulin, Total: 2.9 g/dL (ref 1.5–4.5)
Glucose: 84 mg/dL (ref 65–99)
Potassium: 3.8 mmol/L (ref 3.5–5.2)
Sodium: 143 mmol/L (ref 134–144)
Total Protein: 7.2 g/dL (ref 6.0–8.5)
eGFR: 91 mL/min/{1.73_m2} (ref >59)

## 2020-06-11 LAB — TSH+FREE T4
Free T4: 1.04 ng/dL (ref 0.82–1.77)
TSH: 4.44 u[IU]/mL (ref 0.450–4.500)

## 2020-06-11 NOTE — Progress Notes (Signed)
Thyroid function looks great and is within normal limits now.  LFTs have improved some from the last set of labs.

## 2020-06-12 ENCOUNTER — Encounter: Payer: Self-pay | Admitting: Family Medicine

## 2020-06-12 ENCOUNTER — Ambulatory Visit: Payer: BC Managed Care – PPO | Admitting: Family Medicine

## 2020-06-12 ENCOUNTER — Other Ambulatory Visit: Payer: Self-pay

## 2020-06-12 VITALS — BP 138/80 | HR 80 | Temp 98.5°F | Ht 63.0 in | Wt 139.0 lb

## 2020-06-12 DIAGNOSIS — R7401 Elevation of levels of liver transaminase levels: Secondary | ICD-10-CM | POA: Diagnosis not present

## 2020-06-12 DIAGNOSIS — E7849 Other hyperlipidemia: Secondary | ICD-10-CM

## 2020-06-12 DIAGNOSIS — M25511 Pain in right shoulder: Secondary | ICD-10-CM | POA: Diagnosis not present

## 2020-06-12 DIAGNOSIS — I10 Essential (primary) hypertension: Secondary | ICD-10-CM

## 2020-06-12 DIAGNOSIS — G8929 Other chronic pain: Secondary | ICD-10-CM

## 2020-06-12 NOTE — Assessment & Plan Note (Signed)
improved

## 2020-06-12 NOTE — Patient Instructions (Addendum)
Annual physical exam in I office with MD end August, call if you need me sooner  Fasting lipid, cmp and EGFr and tSH 5 to 7  days before next visit  Reduce atorvastatin to 4 times weekly, and commit to exercise at least 150 mins/ week  No change in blood pressure medication    Shoulder Range of Motion Exercises Shoulder range of motion (ROM) exercises are done to keep the shoulder moving freely or to increase movement. They are often recommended for people who have shoulder pain or stiffness or who are recovering from a shoulder surgery. Phase 1 exercises When you are able, do this exercise 1-2 times per day for 30-60 seconds in each direction, or as directed by your health care provider. Pendulum exercise To do this exercise while sitting: 1. Sit in a chair or at the edge of your bed with your feet flat on the floor. 2. Let your affected arm hang down in front of you over the edge of the bed or chair. 3. Relax your shoulder, arm, and hand. [place] your body so your arm gently swings in small circles. You can also use your unaffected arm to start the motion. 5. Repeat changing the direction of the circles, swinging your arm left and right, and swinging your arm forward and back. To do this exercise while standing: 1. Stand next to a sturdy chair or table, and hold on to it with your hand on your unaffected side. 2. Bend forward at the waist. 3. Bend your knees slightly. 4. Relax your shoulder, arm, and hand. 5. While keeping your shoulder relaxed, use body motion to swing your arm in small circles. 6. Repeat changing the direction of the circles, swinging your arm left and right, and swinging your arm forward and back. 7. Between exercises, stand up tall and take a short break to relax your lower back.   Phase 2 exercises Do these exercises 1-2 times per day or as told by your health care provider. Hold each stretch for 30 seconds, and repeat 3 times. Do the exercises with one or both  arms as instructed by your health care provider. For these exercises, sit at a table with your hand and arm supported by the table. A chair that slides easily or has wheels can be helpful. External rotation 1. Turn your chair so that your affected side is nearest to the table. 2. Place your forearm on the table to your side. Bend your elbow about 90 at the elbow (right angle) and place your hand palm facing down on the table. Your elbow should be about 6 inches away from your side. 3. Keeping your arm on the table, lean your body forward. Abduction 1. Turn your chair so that your affected side is nearest to the table. 2. Place your forearm and hand on the table so that your thumb points toward the ceiling and your arm is straight out to your side. 3. Slide your hand out to the side and away from you, using your unaffected arm to do the work. 4. To increase the stretch, you can slide your chair away from the table. Flexion: forward stretch 1. Sit facing the table. Place your hand and elbow on the table in front of you. 2. Slide your hand forward and away from you, using your unaffected arm to do the work. 3. To increase the stretch, you can slide your chair backward. Phase 3 exercises Do these exercises 1-2 times per day or as  told by your health care provider. Hold each stretch for 30 seconds, and repeat 3 times. Do the exercises with one or both arms as instructed by your health care provider. Cross-body stretch: posterior capsule stretch 1. Lift your arm straight out in front of you. 2. Bend your arm 90 at the elbow (right angle) so your forearm moves across your body. 3. Use your other arm to gently pull the elbow across your body, toward your other shoulder. Wall climbs 1. Stand with your affected arm extended out to the side with your hand resting on a door frame. 2. Slide your hand slowly up the door frame. 3. To increase the stretch, step through the door frame. Keep your body  upright and do not lean. Wand exercises You will need a cane, a piece of PVC pipe, or a sturdy wooden dowel for wand exercises. Flexion To do this exercise while standing: 1. Hold the wand with both of your hands, palms down. 2. Using the other arm to help, lift your arms up and over your head, if able. 3. Push upward with your other arm to gently increase the stretch. To do this exercise while lying down: 1. Lie on your back with your elbows resting on the floor and the wand in both your hands. Your hands will be palm down, or pointing toward your feet. 2. Lift your hands toward the ceiling, using your unaffected arm to help if needed. 3. Bring your arms overhead as able, using your unaffected arm to help if needed. Internal rotation 1. Stand while holding the wand behind you with both hands. Your unaffected arm should be extended above your head with the arm of the affected side extended behind you at the level of your waist. The wand should be pointing straight up and down as you hold it. 2. Slowly pull the wand up behind your back by straightening the elbow of your unaffected arm and bending the elbow of your affected arm. External rotation 1. Lie on your back with your affected upper arm supported on a small pillow or rolled towel. When you first do this exercise, keep your upper arm close to your body. Over time, bring your arm up to a 90 angle out to the side. 2. Hold the wand across your stomach and with both hands palm up. Your elbow on your affected side should be bent at a 90 angle. 3. Use your unaffected side to help push your forearm away from you and toward the floor. Keep your elbow on your affected side bent at a 90 angle. Contact a health care provider if you have:  New or increasing pain.  New numbness, tingling, weakness, or discoloration in your arm or hand. This information is not intended to replace advice given to you by your health care provider. Make sure you  discuss any questions you have with your health care provider. Document Revised: 03/24/2017 Document Reviewed: 03/24/2017 Elsevier Patient Education  2021 [employer].

## 2020-06-12 NOTE — Assessment & Plan Note (Signed)
Hyperlipidemia:Low fat diet discussed and encouraged.   Lipid Panel  Lab Results  Component Value Date   CHOL 206 (H) 05/01/2020   HDL 34 (L) 05/01/2020   LDLCALC 147 (H) 05/01/2020   TRIG 139 05/01/2020   CHOLHDL 6.1 (H) 05/01/2020     \Updated lab needed at/ before next visit. redice lipitor to 4 times weekl and start regular exercise

## 2020-06-12 NOTE — Assessment & Plan Note (Signed)
Exercise to be done at hom call for Ortho referral if needed

## 2020-06-12 NOTE — Assessment & Plan Note (Signed)
Controlled, no change in medication DASH diet and commitment to daily physical activity for a minimum of 30 minutes discussed and encouraged, as a part of hypertension management. The importance of attaining a healthy weight is also discussed.  BP/Weight 06/12/2020 05/08/2020 01/22/2020 12/28/2019 12/26/2019 12/06/2019 09/29/5782  Systolic BP 696 295 284 132 440 102 725  Diastolic BP 80 90 84 366 79 84 95  Wt. (Lbs) 139 145 161.8 158 162 155 160  BMI 24.62 25.69 28.66 27.99 28.7 27.46 27.46

## 2020-06-12 NOTE — Progress Notes (Signed)
   Faith Hood     MRN: [id number]      DOB: [birth year]   HPI Faith Hood is here for follow up and re-evaluation of chronic medical conditions, medication management and review of any available recent lab and radiology data.  Preventive health is updated, specifically  Cancer screening and Immunization.   Questions or concerns regarding consultations or procedures which the PT has had in the interim are  addressed. C/o arm pain on statin, takes intermitently.  2 week h/o right shoulder and arm pain , aggravated by movement, up to a 4, numbness and tingling in feet for x  4 months  ROS Denies recent fever or chills. Denies sinus pressure, nasal congestion, ear pain or sore throat. Denies chest congestion, productive cough or wheezing. Denies chest pains, palpitations and leg swelling Denies abdominal pain, nausea, vomiting,diarrhea or constipation.   Denies dysuria, frequency, hesitancy or incontinence. Occasional light headedness Denies depression, anxiety or insomnia. Denies skin break down or rash.   PE  BP (!) 148/83 (BP Location: Right Arm, Patient Position: Sitting, Cuff Size: Normal)   Pulse 80   Temp 98.5 F (36.9 C) (Temporal)   Ht 5\' 3"  (1.6 m)   Wt 139 lb (63 kg)   SpO2 99%   BMI 24.62 kg/m   Patient alert and oriented and in no cardiopulmonary distress.  HEENT: No facial asymmetry, EOMI,     Neck supple .  Chest: Clear to auscultation bilaterally.  CVS: S1, S2 no murmurs, no S3.Regular rate.  ABD: Soft non tender.   Ext: No edema  MS: Adequate ROM spine,, hips and knees.reduced in right shoulder  Skin: Intact, no ulcerations or rash noted.  Psych: Good eye contact, normal affect. Memory intact not anxious or depressed appearing.  CNS: CN 2-12 intact, power,  normal throughout.no focal deficits noted.   Assessment & Plan  Hypertension Controlled, no change in medication DASH diet and commitment to daily physical activity for a minimum of 30 minutes  discussed and encouraged, as a part of hypertension management. The importance of attaining a healthy weight is also discussed.  BP/Weight 06/12/2020 05/08/2020 01/22/2020 12/28/2019 12/26/2019 12/06/2019 29/10/2424  Systolic BP 834 196 222 979 892 119 417  Diastolic BP 80 90 84 408 79 84 95  Wt. (Lbs) 139 145 161.8 158 162 155 160  BMI 24.62 25.69 28.66 27.99 28.7 27.46 27.46       Hyperlipidemia Hyperlipidemia:Low fat diet discussed and encouraged.   Lipid Panel  Lab Results  Component Value Date   CHOL 206 (H) 05/01/2020   HDL 34 (L) 05/01/2020   LDLCALC 147 (H) 05/01/2020   TRIG 139 05/01/2020   CHOLHDL 6.1 (H) 05/01/2020     \Updated lab needed at/ before next visit. redice lipitor to 4 times weekl and start regular exercise  Transaminitis improved  Shoulder pain, right Exercise to be done at hom call for Ortho referral if needed

## 2020-06-12 NOTE — Addendum Note (Signed)
Addended by: Eual Fines on: 06/12/2020 09:57 AM   Modules accepted: Orders

## 2020-07-21 NOTE — Progress Notes (Deleted)
Referring Provider: Fayrene Helper, MD Primary Care Physician:  Fayrene Helper, MD Primary GI Physician: Dr. Gala Romney  No chief complaint on file.   HPI:   Faith Hood is a 65 y.o. female presenting today with a history of HTN, HLD, fatty liver on CT in 2009, mildly elevated LFTs, polyarteritis nodosa, and mild constipation.   Mildly elevated LFTs since November 2018 with prior work-up including CBC with normal platelets, acute hepatitis panel negative, INR normal, celiac screen negative, ANA negative, AMA negative, ASMA elevated at 24, IgG elevated at 1591, IgA elevated at 430, ceruloplasmin within normal limits, no evidence of alpha-1 antitrypsin deficiency, iron panel with ferritin 430, hemochromatosis DNA with H63D heterozygosity, ferritin later normalized, ultrasound November 2020 within normal limits, MRI with unremarkable liver.  Had discussed possibility of liver biopsy previously.  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 At the time of her last visit on 01/22/20 she was on prednisone 5 mg daily with plans for last dose to be around December 10. Planned to repeat HFP with further recommendations to follow. It was unclear if elevated LFTs may have been secondary to polyarteritis nodosa vs autoimmune hepatitis.   HFP 01/22/20: LFTs normal. Recommend repeat HFP week of March 04, 2020. This should be about 4 weeks after prednisone is discontinued. Low threshold for liver biopsy if LFTs bump.   HFP I ordered wasn't completed.   CMP 05/01/20: AST 52, ALT 35, alk phos 90, t bili 0.6 CMP 4/18: AST 45, ALT 40, alk phos 81, total bilirubin 0.5  Today:  Elevated LFTs:  Constipation:  Benefiber/metamucil:  MiraLAX:   Past Medical History:  Diagnosis Date  . Cerebral vascular disease    Carotid ultrasound in 10/2011: Mild plaque; tortuous vessels; no definite luminal obstruction.   . Hepatic steatosis    By CT scan  . Hyperlipidemia   . Hypertension   . Overweight(278.02)   . 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 Uterine leiomyoma    By CT scan    Past Surgical History:  Procedure Laterality Date  . COLONOSCOPY  12/2007   Negative screening study  . COLONOSCOPY N/A 08/17/2018   Procedure: COLONOSCOPY;  Surgeon: Daneil Dolin, MD; normal exam.  Repeat in 2030.  Marland Kitchen DIAGNOSTIC LAPAROSCOPY  1978   Gynecologic problems    Current Outpatient Medications  Medication Sig Dispense Refill  . alendronate (FOSAMAX) 70 MG tablet Take 1 tablet (70 mg total) by mouth every 7 (seven) days. Take with a full glass of water on an empty stomach. 4 tablet 11  . amLODipine (NORVASC) 10 MG tablet Take 1 tablet (10 mg total) by mouth daily. 90 tablet 1  . amLODipine (NORVASC) 5 MG tablet Take 1 tablet (5 mg total) by mouth daily. TAKE ONE TABLET BY MOUTH DAILY EVERY EVENING 90 tablet 3  . atorvastatin (LIPITOR) 20 MG tablet Take 1 tablet (20 mg total) by mouth daily. 90 tablet 3  . Calcium Carbonate-Vitamin D (CALCIUM 500 + D PO) Take by mouth 2 (two) times daily.    . cloNIDine (CATAPRES) 0.3 MG tablet Take one tablet by mouth at bedtime for blood pressure 30 tablet 3  . ergocalciferol (VITAMIN D2) 1.25 MG (50000 UT) capsule Take 1 capsule (50,000 Units total) by mouth once a week. One capsule once weekly 12 capsule 1  . levothyroxine (SYNTHROID) 50 MCG tablet Take 1 tablet (50 mcg total)  by mouth daily. 90 tablet 1  . losartan (COZAAR) 100 MG tablet Take 1 tablet (100 mg total) by mouth daily. 90 tablet 3   No current facility-administered medications for this visit.    Allergies as of 07/24/2020  . (No Known Allergies)    Family History  Problem Relation Age of Onset  . Pancreatic cancer Mother 75       Diagnosed in 2009; currently in hospice pt. htn  . Hypertension Mother        + Sister x4  . Cancer Father 67       brain tumor  . Hypertension Sister   . Hypertension  Sister   . Hypertension Sister   . Stroke Sister   . Hypertension Sister   . Cancer Maternal Grandmother   . Cancer Paternal Grandmother   . Colon cancer Neg Hx     Social History   Socioeconomic History  . Marital status: Single    Spouse name: Not on file  . Number of children: 0  . Years of education: Not on file  . Highest education level: Not on file  Occupational History  . 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 Smoking status: Never Smoker  . Smokeless tobacco: Never Used  Substance and Sexual Activity  . Alcohol use: Not Currently    Comment: occasionally. About twice a year.   . Drug use: No  . Sexual activity: Not Currently  Other Topics Concern  . Not on file  Social History Narrative  . Not on file   Social Determinants of Health   Financial Resource Strain: Not on file  Food Insecurity: Not on file  Transportation Needs: Not on file  Physical Activity: Not on file  Stress: Not on file  Social Connections: Not on file    Review of Systems: Gen: Denies fever, chills, anorexia. Denies fatigue, weakness, weight loss.  CV: Denies chest pain, palpitations, syncope, peripheral edema, and claudication. Resp: Denies dyspnea at rest, cough, wheezing, coughing up blood, and pleurisy. GI: Denies vomiting blood, jaundice, and fecal incontinence.   Denies dysphagia or odynophagia. Derm: Denies rash, itching, dry skin Psych: Denies depression, anxiety, memory loss, confusion. No homicidal or suicidal ideation.  Heme: Denies bruising, bleeding, and enlarged lymph nodes.  Physical Exam: There were no vitals taken for this visit. General:   Alert and oriented. No distress noted. Pleasant and cooperative.  Head:  Normocephalic and atraumatic. Eyes:  Conjuctiva clear without scleral icterus. Mouth:  Oral mucosa pink and moist. Good dentition. No lesions. Heart:  S1, S2 present without murmurs appreciated. Lungs:  Clear to  auscultation bilaterally. No wheezes, rales, or rhonchi. No distress.  Abdomen:  +BS, soft, non-tender and non-distended. No rebound or guarding. No HSM or masses noted. Msk:  Symmetrical without gross deformities. Normal posture. Extremities:  Without edema. Neurologic:  Alert and  oriented x4 Psych:  Alert and cooperative. Normal mood and affect.

## 2020-07-24 ENCOUNTER — Ambulatory Visit: Payer: BC Managed Care – PPO | Admitting: Gastroenterology

## 2020-07-24 ENCOUNTER — Telehealth (INDEPENDENT_AMBULATORY_CARE_PROVIDER_SITE_OTHER): Payer: BC Managed Care – PPO | Admitting: Gastroenterology

## 2020-07-24 DIAGNOSIS — R7989 Other specified abnormal findings of blood chemistry: Secondary | ICD-10-CM

## 2020-07-24 NOTE — Progress Notes (Signed)
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 Diagnoses with polyarteritis nodosa May 2021 by [facility] Rheumaotlogy and placed on prednisone taper with actual improvement in LFTs during that time. LFTs felt multifactorial in setting of polyarteritis nodosa but unable to exclude coexisting autoimmune hepatitis. [place] and MRI on file without cirrhosis. Most recent LFTs with AST 45 and ALT 40, although this had improved from AST 52 and ALT 35 in March 2022. Last imaging over a year ago.   Celiac screen negative. Not immune to Hep A and B.     Patient canceled virtual appt today. Not seen.

## 2020-08-09 ENCOUNTER — Other Ambulatory Visit: Payer: Self-pay | Admitting: Family Medicine

## 2020-08-27 ENCOUNTER — Other Ambulatory Visit: Payer: Self-pay

## 2020-08-27 ENCOUNTER — Ambulatory Visit: Payer: BC Managed Care – PPO | Admitting: Family Medicine

## 2020-08-27 ENCOUNTER — Encounter: Payer: Self-pay | Admitting: Family Medicine

## 2020-08-27 VITALS — BP 143/80 | HR 88 | Temp 98.9°F | Resp 20 | Ht 63.0 in | Wt 135.0 lb

## 2020-08-27 DIAGNOSIS — R42 Dizziness and giddiness: Secondary | ICD-10-CM

## 2020-08-27 DIAGNOSIS — R0989 Other specified symptoms and signs involving the circulatory and respiratory systems: Secondary | ICD-10-CM

## 2020-08-27 DIAGNOSIS — E039 Hypothyroidism, unspecified: Secondary | ICD-10-CM

## 2020-08-27 DIAGNOSIS — R7989 Other specified abnormal findings of blood chemistry: Secondary | ICD-10-CM | POA: Diagnosis not present

## 2020-08-27 DIAGNOSIS — R200 Anesthesia of skin: Secondary | ICD-10-CM

## 2020-08-27 DIAGNOSIS — E7849 Other hyperlipidemia: Secondary | ICD-10-CM

## 2020-08-27 DIAGNOSIS — M300 Polyarteritis nodosa: Secondary | ICD-10-CM

## 2020-08-27 DIAGNOSIS — E663 Overweight: Secondary | ICD-10-CM

## 2020-08-27 DIAGNOSIS — M25511 Pain in right shoulder: Secondary | ICD-10-CM | POA: Diagnosis not present

## 2020-08-27 DIAGNOSIS — E559 Vitamin D deficiency, unspecified: Secondary | ICD-10-CM

## 2020-08-27 DIAGNOSIS — I10 Essential (primary) hypertension: Secondary | ICD-10-CM

## 2020-08-27 DIAGNOSIS — G8929 Other chronic pain: Secondary | ICD-10-CM

## 2020-08-27 NOTE — Assessment & Plan Note (Signed)
Markedly elevated TSH when most recently checked, get [place] , add free T3 and free T4

## 2020-08-27 NOTE — Patient Instructions (Addendum)
F/U in 6 weeks, call if you need me before  You are referred for [place] of thyroid gland and [place] of arterial blood flow to brain  You are referred to Cardiology, Neurology and Orthopedics  Labs today ESR, lipid, cmp and EGFR, TSH, Free t3 and free T 4 , calcium, magnesium vit D and cBC    Thanks for choosing [facility], we consider it a privelige to serve you.

## 2020-08-27 NOTE — Assessment & Plan Note (Signed)
  Patient re-educated about  the importance of commitment to a  minimum of 150 minutes of exercise per week as able.  The importance of healthy food choices with portion control discussed, as well as eating regularly and within a 12 hour window most days. The need to choose "clean , green" food 50 to 75% of the time is discussed, as well as to make water the primary drink and set a goal of 64 ounces water daily.    Weight /BMI 08/27/2020 06/12/2020 05/08/2020  WEIGHT 135 lb 139 lb 145 lb  HEIGHT 5\' 3"  5\' 3"  5\' 3"   BMI 23.91 kg/m2 24.62 kg/m2 25.69 kg/m2

## 2020-08-27 NOTE — Assessment & Plan Note (Signed)
rcurrent daily "light headness, carotid doppler and refer to cardiology and neurology

## 2020-08-27 NOTE — Progress Notes (Signed)
Faith Hood     MRN: [id number]      DOB: January 28, 1956   HPI Faith Hood is here for follow up c/o daily light headedness since past 6 months, denies vertigo, was concerned that for some time every day it is if she has lost herself, no recall of near syncope , syncope or seizure activity, thought at one time it was prednisone triggered. Duration is  the entire day once she is awake ROS Denies recent fever or chills. Denies sinus pressure, nasal congestion, ear pain or sore throat. Denies chest congestion, productive cough or wheezing. Denies chest pains, palpitations and leg swelling Denies abdominal pain, nausea, vomiting,diarrhea or constipation.   Denies dysuria, frequency, hesitancy or incontinence. Right shoulder and axillary pain with marked limitation in movement , no trauma Denies headaches, seizures, numbness, or tingling. Denies depression, anxiety or insomnia. Denies skin break down or rash.   PE  BP (!) 143/80 (BP Location: Right Arm, Patient Position: Sitting, Cuff Size: Large)   Pulse 88   Temp 98.9 F (37.2 C)   Resp 20   Ht 5\' 3"  (1.6 m)   Wt 135 lb (61.2 kg)   SpO2 96%   BMI 23.91 kg/m   Patient alert and oriented and in no cardiopulmonary distress.  HEENT: No facial asymmetry, EOMI,     Neck supple .  Chest: Clear to auscultation bilaterally.  CVS: S1, S2 no murmurs, no S3.Regular rate.  ABD: Soft non tender.   Ext: No edema  MS: Adequate ROM spine, markedly reduced ROM right shoulder noted.  Psych: Good eye contact, normal affect. Memory intact not anxious or depressed appearing.  CNS: CN 2-12 intact, power,  normal throughout.no focal deficits noted.   Assessment & Plan  Hypothyroidism Markedly elevated TSH when most recently checked, get [place] , add free T3 and free T4  Hyperlipidemia Hyperlipidemia:Low fat diet discussed and encouraged.   Lipid Panel  Lab Results  Component Value Date   CHOL 206 (H) 05/01/2020   HDL 34 (L) 05/01/2020    LDLCALC 147 (H) 05/01/2020   TRIG 139 05/01/2020   CHOLHDL 6.1 (H) 05/01/2020   Uncontrolled  Updated lab needed at/ before next visit.     Hypertension Elevated at visit , no med change at this time DASH diet and commitment to daily physical activity for a minimum of 30 minutes discussed and encouraged, as a part of hypertension management. The importance of attaining a healthy weight is also discussed.  BP/Weight 08/27/2020 06/12/2020 05/08/2020 01/22/2020 12/28/2019 12/26/2019 76/19/5093  Systolic BP 267 124 580 998 338 250 539  Diastolic BP 80 80 90 84 767 79 84  Wt. (Lbs) 135 139 145 161.8 158 162 155  BMI 23.91 24.62 25.69 28.66 27.99 28.7 27.46       Overweight  Patient re-educated about  the importance of commitment to a  minimum of 150 minutes of exercise per week as able.  The importance of healthy food choices with portion control discussed, as well as eating regularly and within a 12 hour window most days. The need to choose "clean , green" food 50 to 75% of the time is discussed, as well as to make water the primary drink and set a goal of 64 ounces water daily.    Weight /BMI 08/27/2020 06/12/2020 05/08/2020  WEIGHT 135 lb 139 lb 145 lb  HEIGHT 5\' 3"  5\' 3"  5\' 3"   BMI 23.91 kg/m2 24.62 kg/m2 25.69 kg/m2      Carotid  bruit rcurrent daily "light headness, carotid doppler and refer to cardiology and neurology  Shoulder pain, right Reduced ROM and pain worsening over months, Ortho eval  Numbness of feet Reports numb feet abnd daily light headedness,  Neurology to eval

## 2020-08-27 NOTE — Assessment & Plan Note (Signed)
Elevated at visit , no med change at this time DASH diet and commitment to daily physical activity for a minimum of 30 minutes discussed and encouraged, as a part of hypertension management. The importance of attaining a healthy weight is also discussed.  BP/Weight 08/27/2020 06/12/2020 05/08/2020 01/22/2020 12/28/2019 12/26/2019 37/16/9678  Systolic BP 938 101 751 025 852 778 242  Diastolic BP 80 80 90 84 353 79 84  Wt. (Lbs) 135 139 145 161.8 158 162 155  BMI 23.91 24.62 25.69 28.66 27.99 28.7 27.46

## 2020-08-27 NOTE — Assessment & Plan Note (Signed)
Reduced ROM and pain worsening over months, Ortho eval

## 2020-08-27 NOTE — Assessment & Plan Note (Signed)
Hyperlipidemia:Low fat diet discussed and encouraged.   Lipid Panel  Lab Results  Component Value Date   CHOL 206 (H) 05/01/2020   HDL 34 (L) 05/01/2020   LDLCALC 147 (H) 05/01/2020   TRIG 139 05/01/2020   CHOLHDL 6.1 (H) 05/01/2020   Uncontrolled  Updated lab needed at/ before next visit.

## 2020-08-27 NOTE — Assessment & Plan Note (Signed)
Reports numb feet abnd daily light headedness,  Neurology to eval

## 2020-08-28 LAB — TSH+T4F+T3FREE
Free T4: 0.93 ng/dL (ref 0.82–1.77)
T3, Free: 2.8 pg/mL (ref 2.0–4.4)
TSH: 5.55 u[IU]/mL — ABNORMAL HIGH (ref 0.450–4.500)

## 2020-08-28 LAB — CBC WITH DIFFERENTIAL/PLATELET
Basophils Absolute: 0 10*3/uL (ref 0.0–0.2)
Basos: 0 %
EOS (ABSOLUTE): 0.2 10*3/uL (ref 0.0–0.4)
Eos: 3 %
Hematocrit: 38.9 % (ref 34.0–46.6)
Hemoglobin: 12.7 g/dL (ref 11.1–15.9)
Immature Grans (Abs): 0 10*3/uL (ref 0.0–0.1)
Immature Granulocytes: 0 %
Lymphocytes Absolute: 1.5 10*3/uL (ref 0.7–3.1)
Lymphs: 22 %
MCH: 28 pg (ref 26.6–33.0)
MCHC: 32.6 g/dL (ref 31.5–35.7)
MCV: 86 fL (ref 79–97)
Monocytes Absolute: 0.3 10*3/uL (ref 0.1–0.9)
Monocytes: 5 %
Neutrophils Absolute: 4.9 10*3/uL (ref 1.4–7.0)
Neutrophils: 70 %
Platelets: 259 10*3/uL (ref 150–450)
RBC: 4.54 x10E6/uL (ref 3.77–5.28)
RDW: 14.2 % (ref 11.7–15.4)
WBC: 7 10*3/uL (ref 3.4–10.8)

## 2020-08-28 LAB — CMP14+EGFR
ALT: 27 IU/L (ref 0–32)
AST: 31 IU/L (ref 0–40)
Albumin/Globulin Ratio: 1.9 (ref 1.2–2.2)
Albumin: 5 g/dL — ABNORMAL HIGH (ref 3.8–4.8)
Alkaline Phosphatase: 89 IU/L (ref 44–121)
BUN/Creatinine Ratio: 18 (ref 12–28)
BUN: 14 mg/dL (ref 8–27)
Bilirubin Total: 0.4 mg/dL (ref 0.0–1.2)
CO2: 23 mmol/L (ref 20–29)
Calcium: 10.4 mg/dL — ABNORMAL HIGH (ref 8.7–10.3)
Chloride: 103 mmol/L (ref 96–106)
Creatinine, Ser: 0.8 mg/dL (ref 0.57–1.00)
Globulin, Total: 2.7 g/dL (ref 1.5–4.5)
Glucose: 84 mg/dL (ref 65–99)
Potassium: 4.2 mmol/L (ref 3.5–5.2)
Sodium: 141 mmol/L (ref 134–144)
Total Protein: 7.7 g/dL (ref 6.0–8.5)
eGFR: 83 mL/min/{1.73_m2} (ref >59)

## 2020-08-28 LAB — LIPID PANEL
Chol/HDL Ratio: 4.1 ratio (ref 0.0–4.4)
Cholesterol, Total: 172 mg/dL (ref 100–199)
HDL: 42 mg/dL (ref >39)
LDL Chol Calc (NIH): 111 mg/dL — ABNORMAL HIGH (ref 0–99)
Triglycerides: 104 mg/dL (ref 0–149)
VLDL Cholesterol Cal: 19 mg/dL (ref 5–40)

## 2020-08-28 LAB — VITAMIN D 25 HYDROXY (VIT D DEFICIENCY, FRACTURES): Vit D, 25-Hydroxy: 61.7 ng/mL (ref 30.0–100.0)

## 2020-08-28 LAB — MAGNESIUM: Magnesium: 2.1 mg/dL (ref 1.6–2.3)

## 2020-08-28 LAB — SEDIMENTATION RATE: Sed Rate: 15 mm/hr (ref 0–40)

## 2020-09-06 ENCOUNTER — Ambulatory Visit (HOSPITAL_COMMUNITY)
Admission: RE | Admit: 2020-09-06 | Discharge: 2020-09-06 | Disposition: A | Discharge status: Home / Self Care | Payer: BC Managed Care – PPO | Source: Ambulatory Visit | Attending: Family Medicine | Admitting: Family Medicine

## 2020-09-06 ENCOUNTER — Other Ambulatory Visit: Payer: Self-pay

## 2020-09-06 DIAGNOSIS — R7989 Other specified abnormal findings of blood chemistry: Secondary | ICD-10-CM | POA: Diagnosis not present

## 2020-09-09 ENCOUNTER — Other Ambulatory Visit: Payer: Self-pay | Admitting: Family Medicine

## 2020-09-09 DIAGNOSIS — R7989 Other specified abnormal findings of blood chemistry: Secondary | ICD-10-CM

## 2020-09-09 DIAGNOSIS — E042 Nontoxic multinodular goiter: Secondary | ICD-10-CM

## 2020-09-19 ENCOUNTER — Ambulatory Visit: Payer: BC Managed Care – PPO

## 2020-09-19 ENCOUNTER — Ambulatory Visit: Payer: BC Managed Care – PPO | Admitting: Orthopedic Surgery

## 2020-09-19 ENCOUNTER — Other Ambulatory Visit: Payer: Self-pay

## 2020-09-19 ENCOUNTER — Encounter: Payer: Self-pay | Admitting: Orthopedic Surgery

## 2020-09-19 VITALS — BP 132/79 | HR 82 | Ht 63.0 in | Wt 133.0 lb

## 2020-09-19 DIAGNOSIS — M25511 Pain in right shoulder: Secondary | ICD-10-CM

## 2020-09-19 DIAGNOSIS — M7501 Adhesive capsulitis of right shoulder: Secondary | ICD-10-CM

## 2020-09-19 DIAGNOSIS — M25512 Pain in left shoulder: Secondary | ICD-10-CM

## 2020-09-19 DIAGNOSIS — G8929 Other chronic pain: Secondary | ICD-10-CM

## 2020-09-19 NOTE — Progress Notes (Signed)
NEW PROBLEM//OFFICE VISIT  Summary assessment and plan:   Encounter Diagnoses  Name Primary?   Acute pain of right shoulder Yes   Adhesive capsulitis of right shoulder    Acute pain of left shoulder     65 years old thyroid disease is a risk factor presents with acute pain atraumatic onset right shoulder with stiffness and loss of motion although she is starting to get some of the motion back also feels onset of pain left shoulder similar  Adhesive capsulitis recommend physical therapy Tylenol Advil heating pad as needed  Follow-up 3 months  Chief Complaint  Patient presents with   Shoulder Pain    Right    64 year old female presents with atraumatic onset of pain right shoulder in the axilla and over the deltoid no evidence of trauma  She says the shoulder is actually loosening up as it was really stiff  She has thyroid disease is a risk factor for adhesive capsulitis but she is not diabetic  She says she feels similar symptoms in the axilla and deltoid on the left without the stiffness    MEDICAL DECISION MAKING  A.  Encounter Diagnoses  Name Primary?   Acute pain of right shoulder Yes   Adhesive capsulitis of right shoulder    Acute pain of left shoulder     B. DATA ANALYSED:   IMAGING: Interpretation of images: Internal images right shoulder are normal  Orders: PT  Outside records reviewed: None available   C. MANAGEMENT   Physical therapy anti-inflammatories  No orders of the defined types were placed in this encounter.    BP 132/79   Pulse 82   Ht '5\' 3"'$  (1.6 m)   Wt 133 lb (60.3 kg)   BMI 23.56 kg/m    General appearance: Well-developed well-nourished no gross deformities  Cardiovascular normal pulse and perfusion normal color without edema  Neurologically no sensation loss or deficits or pathologic reflexes  Psychological: Awake alert and oriented x3 mood and affect normal  Skin no lacerations or ulcerations no nodularity no palpable  masses, no erythema or nodularity  Musculoskeletal: Right shoulder exhibits less external rotation by 30 degrees compared to the left abduction and flexion are also limited compared to the left  The shoulder does not feel warm to touch there is no evidence of skin rash or laceration   ROS Denies fever chills or numbness or tingling  Past Medical History:  Diagnosis Date   Cerebral vascular disease    Carotid ultrasound in 10/2011: Mild plaque; tortuous vessels; no definite luminal obstruction.   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  Repeat in 2030.   DIAGNOSTIC LAPAROSCOPY  1978   Gynecologic problems    Family History  Problem Relation Age of Onset   Pancreatic cancer Mother 109       Diagnosed in 2009; currently in hospice pt. htn   Hypertension Mother        + Sister x4   Cancer Father 33       brain tumor   Hypertension Sister    Hypertension Sister    Hypertension Sister  Stroke Sister    Hypertension Sister    Cancer Maternal Grandmother    Cancer Paternal Grandmother    Colon cancer Neg Hx    Social History   Tobacco Use   Smoking status: Never   Smokeless tobacco: Never  Substance Use Topics   Alcohol use: Not Currently    Comment: occasionally. About twice a year.    Drug use: No    No Known Allergies  Current Meds  Medication Sig   alendronate (FOSAMAX) 70 MG tablet Take 1 tablet (70 mg total) by mouth every 7 (seven) days. Take with a full glass of water on an empty stomach.   amLODipine (NORVASC) 10 MG tablet Take 1 tablet (10 mg total) by mouth daily.   amLODipine (NORVASC) 5 MG tablet Take 1 tablet (5 mg total) by mouth  daily. TAKE ONE TABLET BY MOUTH DAILY EVERY EVENING   atorvastatin (LIPITOR) 20 MG tablet Take 1 tablet (20 mg total) by mouth daily.   Calcium Carbonate-Vitamin D (CALCIUM 500 + D PO) Take by mouth 2 (two) times daily.   cloNIDine (CATAPRES) 0.3 MG tablet Take one tablet by mouth at bedtime for blood pressure   levothyroxine (SYNTHROID) 50 MCG tablet Take 1 tablet (50 mcg total) by mouth daily.   losartan (COZAAR) 100 MG tablet Take 1 tablet by mouth once daily   Vitamin D, Ergocalciferol, (DRISDOL) 1.25 MG (50000 UNIT) CAPS capsule Take 1 capsule by mouth once a week        Arther Abbott, MD  09/19/2020 12:34 PM

## 2020-09-19 NOTE — Patient Instructions (Signed)
START THERAPY   FOR PAIN TYLENOL , ADVIL AND MUSCLE CREAMS ARE OK   HEAT 20 - 30 MIN WILL ALSO HELP   These are the muscle and arthrits creams I recommend:  PLEASE READ THE PACKAGE INSTRUCTIONS BEFORE USING   Ben Gay arthritis cream  Icy hot vanishing gel  Aspercreme odor free  Myoflex Oderless pain reliever  Capzasin  Sportscreme  Max freeze

## 2020-09-23 ENCOUNTER — Ambulatory Visit (HOSPITAL_COMMUNITY)
Admission: RE | Admit: 2020-09-23 | Discharge: 2020-09-23 | Disposition: A | Discharge status: Home / Self Care | Payer: BC Managed Care – PPO | Source: Ambulatory Visit | Attending: Family Medicine | Admitting: Family Medicine

## 2020-09-23 ENCOUNTER — Other Ambulatory Visit: Payer: Self-pay

## 2020-09-23 DIAGNOSIS — R42 Dizziness and giddiness: Secondary | ICD-10-CM | POA: Diagnosis present

## 2020-09-26 ENCOUNTER — Encounter (HOSPITAL_COMMUNITY): Payer: Self-pay

## 2020-09-26 ENCOUNTER — Ambulatory Visit (HOSPITAL_COMMUNITY): Payer: BC Managed Care – PPO | Attending: Orthopedic Surgery

## 2020-09-26 ENCOUNTER — Other Ambulatory Visit: Payer: Self-pay

## 2020-09-26 DIAGNOSIS — M25611 Stiffness of right shoulder, not elsewhere classified: Secondary | ICD-10-CM | POA: Insufficient documentation

## 2020-09-26 DIAGNOSIS — M25511 Pain in right shoulder: Secondary | ICD-10-CM | POA: Insufficient documentation

## 2020-09-26 DIAGNOSIS — R29898 Other symptoms and signs involving the musculoskeletal system: Secondary | ICD-10-CM | POA: Insufficient documentation

## 2020-09-26 NOTE — Therapy (Signed)
[place], [place], [address] Phone: [phone]   Fax:  [phone]  Occupational Therapy Evaluation  Patient Details  Name: MAESON GRIFFON MRN: [id number] Date of Birth: 08/15/1955 Referring Provider (OT): Arther Abbott, MD   Encounter Date: 09/26/2020   OT End of Session - 09/26/20 1349     Visit Number 1    Number of Visits 12    Date for OT Re-Evaluation 11/07/20    Authorization Type [employer] State health plan    Authorization Time Period 20% co-insurance, no copay, no visit limit    OT Start Time 1300    OT Stop Time 1338    OT Time Calculation (min) 38 min    Activity Tolerance Patient tolerated treatment well    Behavior During Therapy [facility] for tasks assessed/performed             Past Medical History:  Diagnosis Date   Cerebral vascular disease    Carotid ultrasound in 10/2011: Mild plaque; tortuous vessels; no definite luminal obstruction.   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  Repeat in 2030.   DIAGNOSTIC LAPAROSCOPY  1978   Gynecologic problems    There were no vitals filed for this visit.   Subjective Assessment - 09/26/20 1305     Subjective  S: I really can't do much with this arm.    Pertinent History Patient is a 65 y/o female S/P right shoulder pain/adhesive capsulitis which began approximately 3 months ago. patient reports that in the last month she feels like it may have gotten a little bit better. Dr. Aline Brochure has referred patient to occupational theraypy for evaluation and treatment.    Patient Stated Goals To decrease pain and increase movement and use of right  arm.    Currently in Pain? No/denies   pain only present with increased use. 6/10.              [facility] OT Assessment - 09/26/20 1306       Assessment   Medical Diagnosis Right shoulder adhesive capsulitis    Referring Provider (OT) Arther Abbott, MD    Onset Date/Surgical Date --   approximately 3 months   Hand Dominance Right    Next MD Visit 12/19/20    Prior Therapy Patient received outpatient OT services in 2012 for her right UE      Precautions   Precautions None      Restrictions   Weight Bearing Restrictions No      Balance Screen   Has the patient fallen in the past 6 months No      Home  Environment   Family/patient expects to be discharged to: Private residence      Prior Function   Level of Independence Independent      ADL   ADL comments Difficulty fixing hair (top of head), bathing (reaching across the left side and reaching under armpit), lifting items out of fridge, reaching and putting on seatbelt.      Mobility   Mobility Status Independent      Written Expression  Dominant Hand Right      Vision - History   Baseline Vision No visual deficits      Cognition   Overall Cognitive Status Within Functional Limits for tasks assessed      Observation/Other Assessments   Focus on Therapeutic Outcomes (FOTO)  45/100      ROM / Strength   AROM / PROM / Strength AROM;PROM;Strength      Palpation   Palpation comment Min fascial restrictions palpated in right medial deltoid and upper trapezius region.      AROM   Overall AROM Comments Assessed seated. IR/er adducted    AROM Assessment Site Shoulder    Right/Left Shoulder Right    Right Shoulder Flexion 90 Degrees    Right Shoulder ABduction 61 Degrees    Right Shoulder Internal Rotation 90 Degrees    Right Shoulder External Rotation 60 Degrees      PROM   Overall PROM Comments Assessed supine. IR/er adducted.    PROM Assessment Site Shoulder    Right/Left Shoulder Right    Right Shoulder  Flexion 106 Degrees    Right Shoulder ABduction 95 Degrees    Right Shoulder Internal Rotation 90 Degrees    Right Shoulder External Rotation 70 Degrees      Strength   Overall Strength Comments Assessed seated. IR/er adducted    Strength Assessment Site Shoulder    Right/Left Shoulder Right    Right Shoulder Flexion 3-/5    Right Shoulder ABduction 3-/5    Right Shoulder Internal Rotation 3-/5    Right Shoulder External Rotation 3-/5                             OT Education - 09/26/20 1345     Education Details table slides; seated row and extension    Person(s) Educated Patient    Methods Explanation;Demonstration;Handout    Comprehension Verbalized understanding;Returned demonstration              OT Short Term Goals - 09/26/20 1354       OT SHORT TERM GOAL #1   Title Patient will be educated and independent with HEP in order to increase functional use of her RUE during daily tasks while using it for 75% or more of needed self care tasks.    Time 3    Period Weeks    Status New    Target Date 10/17/20      OT SHORT TERM GOAL #2   Title Patient will increase RUE P/ROM to [facility] in order to increase her ability to complete upper body dressing tasks with less difficulty.    Time 3    Period Weeks    Status New      OT SHORT TERM GOAL #3   Title Patient will increase her RUE strength to 3/5 in order to complete reaching tasks at or below shoulder level with less difficulty.    Time 3    Period Weeks    Status New      OT SHORT TERM GOAL #4   Title Patient will report a decrease in pain of approximately 4/10 or less when using her RUE to complete low level reaching tasks.    Time 3    Period Weeks    Status New               OT Long Term Goals - 09/26/20 1359       OT  LONG TERM GOAL #1   Title Patient will return to using her RUE as her dominant extremity for all daily tasks.    Time 6    Period Weeks    Status New    Target Date  11/07/20      OT LONG TERM GOAL #2   Title Patient will increase her RUE A/ROM to [facility] in order to be able to complete mid level reachin tasks with less difficulty.    Time 6    Period Weeks    Status New      OT LONG TERM GOAL #3   Title Patient will increase her RUE strength to 4-/5 in order to be able to pick and hold a grovery bag at her side of medium weight (5-10lbs).    Time 6    Period Weeks    Status New      OT LONG TERM GOAL #4   Title Patient will decrease her RUE pain during grooming and dressing tasks of approximately 3/10 or less.    Time 6    Period Weeks    Status New      OT LONG TERM GOAL #5   Title Patient will decrease her right UE fascial restrictions to trace amount in order to increase the functional mobility needed to complete bathing activities that require cross body reaching.    Time 6    Period Weeks    Status New                   Plan - 09/26/20 1350     Clinical Impression Statement A: Pt is a 65 y/o female S/P right shoulder adhesive capsulitis causing increased pain, fascial restrictions, and decreased ROM and strength resulting in increased difficulty completing daily tasks such as grooming, dressing, and reaching tasks.    OT Occupational Profile and History Problem Focused Assessment - Including review of records relating to presenting problem    Occupational performance deficits (Please refer to evaluation for details): ADL's;IADL's    Body Structure / Function / Physical Skills ADL;UE functional use;Fascial restriction;Pain;ROM;Strength    Rehab Potential Excellent    Clinical Decision Making Limited treatment options, no task modification necessary    Comorbidities Affecting Occupational Performance: May have comorbidities impacting occupational performance    Modification or Assistance to Complete Evaluation  No modification of tasks or assist necessary to complete eval    OT Frequency 2x / week    OT Duration 6 weeks    OT  Treatment/Interventions Self-care/ADL training;Ultrasound;Patient/family education;DME and/or AE instruction;Passive range of motion;Cryotherapy;Electrical Stimulation;Moist Heat;Neuromuscular education;Therapeutic exercise;Manual Therapy;Therapeutic activities    Plan P: Patient will benefit from skilled OT services to increase functional use of her right UE during daily tasks. Treatment plan: Myofascial release, manual stretching, AA/ROM, A/ROM, scapular and shoulder strengthening, [place] to decrease adhesions.    OT Home Exercise Plan eval: table slides; seated scapular row and extension             Patient will benefit from skilled therapeutic intervention in order to improve the following deficits and impairments:   Body Structure / Function / Physical Skills: ADL, UE functional use, Fascial restriction, Pain, ROM, Strength       Visit Diagnosis: Other symptoms and signs involving the musculoskeletal system - Plan: Ot plan of care cert/re-cert  Stiffness of right shoulder, not elsewhere classified - Plan: Ot plan of care cert/re-cert  Acute pain of right shoulder - Plan: Ot plan of care cert/re-cert  Problem List Patient Active Problem List   Diagnosis Date Noted   Numbness of feet 08/27/2020   Immunization due 05/08/2020   Back pain with radiculopathy 11/30/2019   Spasm of muscle of lower back 11/30/2019   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

## 2020-09-26 NOTE — Patient Instructions (Signed)
Complete the follow 2-3 times a day.    1) SHOULDER: Flexion On Table   Place hands on towel placed on table, elbows straight. Lean forward with you upper body, pushing towel away from body.  __10-12_ reps per set,  2) Abduction (Passive)   With arm out to side, resting on towel placed on table with palm DOWN, keeping trunk away from table, lean to the side while pushing towel away from body.  Repeat __10-12__ times.   Copyright  VHI. All rights reserved.     3) Internal Rotation (Assistive)   Seated with elbow bent at right angle and held against side, slide arm on table surface in an inward arc keeping elbow anchored in place. Repeat __10-12__ times. Activity: Use this motion to brush crumbs off the table.  1) Seated Row   Sit up straight with elbows by your sides. Pull back with shoulders/elbows, keeping forearms straight, as if pulling back on the reins of a horse. Squeeze shoulder blades together. Repeat _10_times,     3) Shoulder Extension    Sit up straight with both arms by your side, draw your arms back behind your waist. Keep your elbows straight. Repeat __10-__times

## 2020-09-30 ENCOUNTER — Ambulatory Visit (HOSPITAL_COMMUNITY): Payer: BC Managed Care – PPO

## 2020-09-30 ENCOUNTER — Other Ambulatory Visit: Payer: Self-pay

## 2020-09-30 ENCOUNTER — Encounter (HOSPITAL_COMMUNITY): Payer: BC Managed Care – PPO

## 2020-09-30 ENCOUNTER — Encounter (HOSPITAL_COMMUNITY): Payer: Self-pay

## 2020-09-30 DIAGNOSIS — R29898 Other symptoms and signs involving the musculoskeletal system: Secondary | ICD-10-CM

## 2020-09-30 DIAGNOSIS — M25511 Pain in right shoulder: Secondary | ICD-10-CM

## 2020-09-30 DIAGNOSIS — M25611 Stiffness of right shoulder, not elsewhere classified: Secondary | ICD-10-CM

## 2020-09-30 NOTE — Therapy (Signed)
[place], [place], [address] Phone: [phone]   Fax:  [phone]  Occupational Therapy Treatment  Patient Details  Name: Faith Hood MRN: [id number] Date of Birth: October 13, 1955 Referring Provider (OT): Arther Abbott, MD   Encounter Date: 09/30/2020   OT End of Session - 09/30/20 1340     Visit Number 2    Number of Visits 12    Date for OT Re-Evaluation 11/07/20    Authorization Type [employer] health plan    Authorization Time Period 20% co-insurance, no copay, no visit limit    OT Start Time 1115    OT Stop Time 1153    OT Time Calculation (min) 38 min    Activity Tolerance Patient tolerated treatment well    Behavior During Therapy [facility] for tasks assessed/performed             Past Medical History:  Diagnosis Date   Cerebral vascular disease    Carotid ultrasound in 10/2011: Mild plaque; tortuous vessels; no definite luminal obstruction.   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  Repeat in 2030.   DIAGNOSTIC LAPAROSCOPY  1978   Gynecologic problems    There were no vitals filed for this visit.       [facility] OT Assessment - 09/30/20 1135       Assessment   Medical Diagnosis Right shoulder adhesive capsulitis    Referring Provider (OT) Arther Abbott, MD      Precautions   Precautions None                      OT Treatments/Exercises (OP) - 09/30/20 1135       Exercises   Exercises Shoulder      Shoulder Exercises: Supine   Protraction PROM;10 reps    Horizontal ABduction PROM;AAROM;10 reps    External Rotation PROM;AAROM;10 reps    Internal  Rotation PROM;AAROM;10 reps    Flexion PROM;AAROM;10 reps    ABduction PROM;10 reps      Shoulder Exercises: Therapy Ball   Flexion 10 reps   2" hold at end stretch   ABduction 10 reps   2" hold at end stretch     Shoulder Exercises: ROM/Strengthening   Wall Wash 1'      Manual Therapy   Manual Therapy Myofascial release;Muscle Energy Technique    Manual therapy comments Manual therapy completed prior to exercises.    Myofascial Release Myofascial release and manual stretching completed to right upper arm, and upper trapezious region to decrease fascial restrictions and increase joint mobility in a pain free zone.    Muscle Energy Technique Muscle energy technique completed to right shoulder flexors and external rotators to relax tone and muscle spasm and improve range of motion.                      OT Short Term Goals - 09/30/20 1119       OT SHORT TERM GOAL #1   Title Patient will be educated and independent with HEP in order to increase  functional use of her RUE during daily tasks while using it for 75% or more of needed self care tasks.    Time 3    Period Weeks    Status On-going    Target Date 10/17/20      OT SHORT TERM GOAL #2   Title Patient will increase RUE P/ROM to [facility] in order to increase her ability to complete upper body dressing tasks with less difficulty.    Time 3    Period Weeks    Status On-going      OT SHORT TERM GOAL #3   Title Patient will increase her RUE strength to 3/5 in order to complete reaching tasks at or below shoulder level with less difficulty.    Time 3    Period Weeks    Status On-going      OT SHORT TERM GOAL #4   Title Patient will report a decrease in pain of approximately 4/10 or less when using her RUE to complete low level reaching tasks.    Time 3    Period Weeks    Status On-going               OT Long Term Goals - 09/30/20 1119       OT LONG TERM GOAL #1   Title Patient will return to using her RUE as  her dominant extremity for all daily tasks.    Time 6    Period Weeks    Status On-going      OT LONG TERM GOAL #2   Title Patient will increase her RUE A/ROM to [facility] in order to be able to complete mid level reachin tasks with less difficulty.    Time 6    Period Weeks    Status On-going      OT LONG TERM GOAL #3   Title Patient will increase her RUE strength to 4-/5 in order to be able to pick and hold a grocery bag at her side of medium weight (5-10lbs).    Time 6    Period Weeks    Status On-going      OT LONG TERM GOAL #4   Title Patient will decrease her RUE pain during grooming and dressing tasks of approximately 3/10 or less.    Time 6    Period Weeks    Status On-going      OT LONG TERM GOAL #5   Title Patient will decrease her right UE fascial restrictions to trace amount in order to increase the functional mobility needed to complete bathing activities that require cross body reaching.    Time 6    Period Weeks    Status On-going                   Plan - 09/30/20 1551     Clinical Impression Statement A: Initiated myofascial release to the right upper arm and upper trapezius region to address min fascial restrictions. patient completed AA/ROM with 2# dowel rod while holding for 2 seconds at end stretch to further increase ROM. VC for form and technique were provided during session.    Body Structure / Function / Physical Skills ADL;UE functional use;Fascial restriction;Pain;ROM;Strength    Plan P: Continue with stretching. Add pulleys             Patient will benefit from skilled therapeutic intervention in order to improve the following deficits and impairments:   Body Structure / Function / Physical Skills: ADL, UE  functional use, Fascial restriction, Pain, ROM, Strength       Visit Diagnosis: Other symptoms and signs involving the musculoskeletal system  Acute pain of right shoulder  Stiffness of right shoulder, not elsewhere  classified    Problem List Patient Active Problem List   Diagnosis Date Noted   Numbness of feet 08/27/2020   Immunization due 05/08/2020   Back pain with radiculopathy 11/30/2019   Spasm of muscle of lower back 11/30/2019   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

## 2020-10-01 ENCOUNTER — Encounter: Payer: BC Managed Care – PPO | Admitting: Family Medicine

## 2020-10-02 ENCOUNTER — Other Ambulatory Visit: Payer: Self-pay

## 2020-10-02 ENCOUNTER — Encounter (HOSPITAL_COMMUNITY): Payer: Self-pay

## 2020-10-02 ENCOUNTER — Ambulatory Visit (HOSPITAL_COMMUNITY): Payer: BC Managed Care – PPO

## 2020-10-02 DIAGNOSIS — M25511 Pain in right shoulder: Secondary | ICD-10-CM

## 2020-10-02 DIAGNOSIS — M25611 Stiffness of right shoulder, not elsewhere classified: Secondary | ICD-10-CM

## 2020-10-02 DIAGNOSIS — R29898 Other symptoms and signs involving the musculoskeletal system: Secondary | ICD-10-CM

## 2020-10-02 NOTE — Therapy (Signed)
[place], [place], [address] Phone: [phone]   Fax:  [phone]  Occupational Therapy Treatment  Patient Details  Name: Faith Hood MRN: [id number] Date of Birth: 03/30/1955 Referring Provider (OT): Arther Abbott, MD   Encounter Date: 10/02/2020   OT End of Session - 10/02/20 1609     Visit Number 3    Number of Visits 12    Date for OT Re-Evaluation 11/07/20    Authorization Type [employer] health plan    Authorization Time Period 20% co-insurance, no copay, no visit limit    OT Start Time 1515    OT Stop Time 1558    OT Time Calculation (min) 43 min    Activity Tolerance Patient tolerated treatment well    Behavior During Therapy [facility] for tasks assessed/performed             Past Medical History:  Diagnosis Date   Cerebral vascular disease    Carotid ultrasound in 10/2011: Mild plaque; tortuous vessels; no definite luminal obstruction.   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  Repeat in 2030.   DIAGNOSTIC LAPAROSCOPY  1978   Gynecologic problems    There were no vitals filed for this visit.   Subjective Assessment - 10/02/20 1537     Subjective  S: It feels pretty good today.    Currently in Pain? No/denies                [facility] OT Assessment - 10/02/20 1540       Assessment   Medical Diagnosis Right shoulder adhesive capsulitis      Precautions   Precautions None                      OT Treatments/Exercises (OP) - 10/02/20 1540       Exercises   Exercises Shoulder      Shoulder Exercises: Supine   Protraction PROM;10 reps    Horizontal  ABduction PROM;AAROM;10 reps    External Rotation PROM;AAROM;10 reps    Internal Rotation PROM;AAROM;10 reps    Flexion PROM;AAROM;10 reps    ABduction PROM;10 reps      Shoulder Exercises: Pulleys   Flexion 1 minute    ABduction 1 minute      Shoulder Exercises: Stretch   Cross Chest Stretch 2 reps;20 seconds    Wall Stretch - Flexion 2 reps;20 seconds    Wall Stretch - ABduction 2 reps;20 seconds      Manual Therapy   Manual Therapy Myofascial release;Muscle Energy Technique    Manual therapy comments Manual therapy completed prior to exercises.    Myofascial Release Myofascial release and manual stretching completed to right upper arm, and upper trapezious region to decrease fascial restrictions and increase joint mobility in a pain free zone.    Muscle Energy Technique Muscle energy technique completed to right shoulder flexors and abductors to relax tone and muscle spasm and improve range of motion.  OT Education - 10/02/20 1555     Education Details shoulder stretches: cross body, flexion, abduction    Person(s) Educated Patient    Methods Explanation;Demonstration;Verbal cues;Handout    Comprehension Returned demonstration;Verbalized understanding              OT Short Term Goals - 09/30/20 1119       OT SHORT TERM GOAL #1   Title Patient will be educated and independent with HEP in order to increase functional use of her RUE during daily tasks while using it for 75% or more of needed self care tasks.    Time 3    Period Weeks    Status On-going    Target Date 10/17/20      OT SHORT TERM GOAL #2   Title Patient will increase RUE P/ROM to [facility] in order to increase her ability to complete upper body dressing tasks with less difficulty.    Time 3    Period Weeks    Status On-going      OT SHORT TERM GOAL #3   Title Patient will increase her RUE strength to 3/5 in order to complete reaching tasks at or below shoulder level with less  difficulty.    Time 3    Period Weeks    Status On-going      OT SHORT TERM GOAL #4   Title Patient will report a decrease in pain of approximately 4/10 or less when using her RUE to complete low level reaching tasks.    Time 3    Period Weeks    Status On-going               OT Long Term Goals - 09/30/20 1119       OT LONG TERM GOAL #1   Title Patient will return to using her RUE as her dominant extremity for all daily tasks.    Time 6    Period Weeks    Status On-going      OT LONG TERM GOAL #2   Title Patient will increase her RUE A/ROM to [facility] in order to be able to complete mid level reachin tasks with less difficulty.    Time 6    Period Weeks    Status On-going      OT LONG TERM GOAL #3   Title Patient will increase her RUE strength to 4-/5 in order to be able to pick and hold a grocery bag at her side of medium weight (5-10lbs).    Time 6    Period Weeks    Status On-going      OT LONG TERM GOAL #4   Title Patient will decrease her RUE pain during grooming and dressing tasks of approximately 3/10 or less.    Time 6    Period Weeks    Status On-going      OT LONG TERM GOAL #5   Title Patient will decrease her right UE fascial restrictions to trace amount in order to increase the functional mobility needed to complete bathing activities that require cross body reaching.    Time 6    Period Weeks    Status On-going                   Plan - 10/02/20 1611     Clinical Impression Statement A: Continued with myofascial release to address min fascial restrictions located at the upper arm and upper trapezius region. Muscle energy technique was used to increase ROM  and decrease tone. patient was able to achieve grater passive abduction after technique. Added shoulder stretches and provided for HEP. Completed pulleys to further increase ROM.    Body Structure / Function / Physical Skills ADL;UE functional use;Fascial restriction;Pain;ROM;Strength    Plan  P: Add doorway stretch.    OT Home Exercise Plan eval: table slides; seated scapular row and extension 8/10: shoulder stretches: flexion, abduction, cross body    Consulted and Agree with Plan of Care Patient             Patient will benefit from skilled therapeutic intervention in order to improve the following deficits and impairments:   Body Structure / Function / Physical Skills: ADL, UE functional use, Fascial restriction, Pain, ROM, Strength       Visit Diagnosis: Acute pain of right shoulder  Stiffness of right shoulder, not elsewhere classified  Other symptoms and signs involving the musculoskeletal system    Problem List Patient Active Problem List   Diagnosis Date Noted   Numbness of feet 08/27/2020   Immunization due 05/08/2020   Back pain with radiculopathy 11/30/2019   Spasm of muscle of lower back 11/30/2019   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

## 2020-10-02 NOTE — Patient Instructions (Signed)
Complete the following exercises 2-3 times a day.    Posterior Capsule Stretch   Stand or sit, one arm across body so hand rests over opposite shoulder. Gently push on crossed elbow with other hand until stretch is felt in shoulder of crossed arm. Hold _20-30__ seconds.  Repeat _2__ times per session.    Wall Flexion  Slide your arm up the wall or door frame until a stretch is felt in your shoulder . Hold for 20-30 seconds. Complete 2 times     Shoulder Abduction Stretch  Stand side ways by a wall with affected up on wall. Gently step in toward wall to feel stretch. Hold for 20-30 seconds. Complete 2 times.

## 2020-10-07 ENCOUNTER — Other Ambulatory Visit: Payer: Self-pay

## 2020-10-07 ENCOUNTER — Ambulatory Visit (HOSPITAL_COMMUNITY): Payer: BC Managed Care – PPO

## 2020-10-07 ENCOUNTER — Encounter (HOSPITAL_COMMUNITY): Payer: Self-pay

## 2020-10-07 DIAGNOSIS — M25611 Stiffness of right shoulder, not elsewhere classified: Secondary | ICD-10-CM

## 2020-10-07 DIAGNOSIS — R29898 Other symptoms and signs involving the musculoskeletal system: Secondary | ICD-10-CM | POA: Diagnosis not present

## 2020-10-07 DIAGNOSIS — M25511 Pain in right shoulder: Secondary | ICD-10-CM

## 2020-10-07 NOTE — Patient Instructions (Signed)
Complete the following exercises 2-3 times a day.  Doorway Stretch  Place each hand opposite each other on the doorway. (You can change where you feel the stretch by moving arms higher or lower.) Step through with one foot and bend front knee until a stretch is felt and hold. Step through with the opposite foot on the next rep. Hold for _20-30__ seconds. Repeat __2__times.

## 2020-10-07 NOTE — Therapy (Signed)
[place], [place], [address] Phone: [phone]   Fax:  [phone]  Occupational Therapy Treatment  Patient Details  Name: Faith Hood MRN: [id number] Date of Birth: Jan 30, 1956 Referring Provider (OT): Arther Abbott, MD   Encounter Date: 10/07/2020   OT End of Session - 10/07/20 1255     Visit Number 4    Number of Visits 12    Date for OT Re-Evaluation 11/07/20    Authorization Type [employer] health plan    Authorization Time Period 20% co-insurance, no copay, no visit limit    OT Start Time 1115    OT Stop Time 1153    OT Time Calculation (min) 38 min    Activity Tolerance Patient tolerated treatment well    Behavior During Therapy [facility] for tasks assessed/performed             Past Medical History:  Diagnosis Date   Cerebral vascular disease    Carotid ultrasound in 10/2011: Mild plaque; tortuous vessels; no definite luminal obstruction.   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  Repeat in 2030.   DIAGNOSTIC LAPAROSCOPY  1978   Gynecologic problems    There were no vitals filed for this visit.   Subjective Assessment - 10/07/20 1152     Subjective  S: It's hard to relax.    Currently in Pain? No/denies                [facility] OT Assessment - 10/07/20 1152       Assessment   Medical Diagnosis Right shoulder adhesive capsulitis      Precautions   Precautions None                      OT Treatments/Exercises (OP) - 10/07/20 1248       Shoulder Exercises: Supine   Protraction PROM;10 reps    Horizontal ABduction PROM;10 reps    External Rotation  PROM;10 reps    Internal Rotation PROM;10 reps    Flexion PROM;10 reps    ABduction PROM;10 reps      Shoulder Exercises: Stretch   Corner Stretch 2 reps;20 seconds   doorway     Modalities   Modalities Ultrasound      Ultrasound   Ultrasound Location right shoulder (arm abducted and externally rotated)    Ultrasound Parameters 1.5 W/cm2    Ultrasound Goals Other (Comment)   decrease adhesions     Manual Therapy   Manual Therapy Myofascial release;Muscle Energy Technique    Manual therapy comments Manual therapy completed prior to exercises.    Myofascial Release Myofascial release and manual stretching completed to right upper arm, and upper trapezious region to decrease fascial restrictions and increase joint mobility in a pain free zone.    Muscle Energy Technique Muscle energy technique completed to right shoulder flexors, abductors, internal and external rotators to relax tone and muscle spasm and improve range of motion.                    OT Education -  10/07/20 1231     Education Details shoulder stretch: doorway    Person(s) Educated Patient    Methods Explanation;Demonstration;Verbal cues;Handout    Comprehension Returned demonstration;Verbalized understanding              OT Short Term Goals - 09/30/20 1119       OT SHORT TERM GOAL #1   Title Patient will be educated and independent with HEP in order to increase functional use of her RUE during daily tasks while using it for 75% or more of needed self care tasks.    Time 3    Period Weeks    Status On-going    Target Date 10/17/20      OT SHORT TERM GOAL #2   Title Patient will increase RUE P/ROM to [facility] in order to increase her ability to complete upper body dressing tasks with less difficulty.    Time 3    Period Weeks    Status On-going      OT SHORT TERM GOAL #3   Title Patient will increase her RUE strength to 3/5 in order to complete reaching tasks at or below shoulder level with less  difficulty.    Time 3    Period Weeks    Status On-going      OT SHORT TERM GOAL #4   Title Patient will report a decrease in pain of approximately 4/10 or less when using her RUE to complete low level reaching tasks.    Time 3    Period Weeks    Status On-going               OT Long Term Goals - 09/30/20 1119       OT LONG TERM GOAL #1   Title Patient will return to using her RUE as her dominant extremity for all daily tasks.    Time 6    Period Weeks    Status On-going      OT LONG TERM GOAL #2   Title Patient will increase her RUE A/ROM to [facility] in order to be able to complete mid level reachin tasks with less difficulty.    Time 6    Period Weeks    Status On-going      OT LONG TERM GOAL #3   Title Patient will increase her RUE strength to 4-/5 in order to be able to pick and hold a grocery bag at her side of medium weight (5-10lbs).    Time 6    Period Weeks    Status On-going      OT LONG TERM GOAL #4   Title Patient will decrease her RUE pain during grooming and dressing tasks of approximately 3/10 or less.    Time 6    Period Weeks    Status On-going      OT LONG TERM GOAL #5   Title Patient will decrease her right UE fascial restrictions to trace amount in order to increase the functional mobility needed to complete bathing activities that require cross body reaching.    Time 6    Period Weeks    Status On-going                   Plan - 10/07/20 1259     Clinical Impression Statement A: Due to increased muscle guarding, continued to use muscle energy technique to help increase P/ROM of the RUE. Minimal fascial restrictions noted this date in the right upper arm and upper trapezius  region. Ultrasound utilized to help decrease adhesion located in right shoulder. Doorway stretch completed and added to HEP. VC for form and technique were provided.    Body Structure / Function / Physical Skills ADL;UE functional use;Fascial  restriction;Pain;ROM;Strength    Plan P: Continue with muscle energy technique. Myofascial release PRN.    OT Home Exercise Plan eval: table slides; seated scapular row and extension 8/10: shoulder stretches: flexion, abduction, cross body 8/15: doorway stretch    Consulted and Agree with Plan of Care Patient             Patient will benefit from skilled therapeutic intervention in order to improve the following deficits and impairments:   Body Structure / Function / Physical Skills: ADL, UE functional use, Fascial restriction, Pain, ROM, Strength       Visit Diagnosis: Stiffness of right shoulder, not elsewhere classified  Acute pain of right shoulder  Other symptoms and signs involving the musculoskeletal system    Problem List Patient Active Problem List   Diagnosis Date Noted   Numbness of feet 08/27/2020   Immunization due 05/08/2020   Back pain with radiculopathy 11/30/2019   Spasm of muscle of lower back 11/30/2019   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

## 2020-10-09 ENCOUNTER — Other Ambulatory Visit: Payer: Self-pay

## 2020-10-09 ENCOUNTER — Ambulatory Visit (HOSPITAL_COMMUNITY): Payer: BC Managed Care – PPO

## 2020-10-09 ENCOUNTER — Ambulatory Visit (INDEPENDENT_AMBULATORY_CARE_PROVIDER_SITE_OTHER): Payer: BC Managed Care – PPO | Admitting: Family Medicine

## 2020-10-09 ENCOUNTER — Encounter (HOSPITAL_COMMUNITY): Payer: Self-pay

## 2020-10-09 VITALS — BP 135/83 | HR 73 | Temp 98.5°F | Resp 18 | Ht 63.0 in | Wt 134.0 lb

## 2020-10-09 DIAGNOSIS — M25511 Pain in right shoulder: Secondary | ICD-10-CM

## 2020-10-09 DIAGNOSIS — E7849 Other hyperlipidemia: Secondary | ICD-10-CM | POA: Diagnosis not present

## 2020-10-09 DIAGNOSIS — Z0000 Encounter for general adult medical examination without abnormal findings: Secondary | ICD-10-CM

## 2020-10-09 DIAGNOSIS — M25611 Stiffness of right shoulder, not elsewhere classified: Secondary | ICD-10-CM

## 2020-10-09 DIAGNOSIS — R29898 Other symptoms and signs involving the musculoskeletal system: Secondary | ICD-10-CM

## 2020-10-09 DIAGNOSIS — I10 Essential (primary) hypertension: Secondary | ICD-10-CM

## 2020-10-09 MED ORDER — ALENDRONATE SODIUM 70 MG PO TABS: 70.0000 mg | ORAL_TABLET | ORAL | 11 refills | Status: DC

## 2020-10-09 NOTE — Patient Instructions (Signed)
External Rotator Cuff Stretch, Supine    Lie supine, fingers clasped behind head, elbows close together. Pull elbows backward while pinching shoulder blades. Hold _20__ seconds. Repeat _2__ times per session. Do _1-2__ sessions per day.  Copyright  VHI. All rights reserved.

## 2020-10-09 NOTE — Patient Instructions (Addendum)
F/U mid November, call if you need me sooner  Please  contact pt with endo appt info, she was referred mid July  Fasting lipid, cmp and EGFR 3 day before Nov appt  Need to commit to calcium 1000 to 1200 mg daily and vit D 1000 IU daily, also weekly fosamax, stop once weekly vit D when you finish what you have, take it oTC once daily  Please get your covid booster  Call and come for September flu vaccine  Thanks for choosing [facility], we consider it a privelige to serve you.

## 2020-10-10 ENCOUNTER — Encounter (HOSPITAL_COMMUNITY): Payer: BC Managed Care – PPO | Admitting: Physical Therapy

## 2020-10-10 ENCOUNTER — Ambulatory Visit: Payer: BC Managed Care – PPO | Admitting: Gastroenterology

## 2020-10-10 ENCOUNTER — Encounter: Payer: Self-pay | Admitting: Family Medicine

## 2020-10-10 NOTE — Progress Notes (Signed)
    Faith Hood     MRN: [id number]      DOB: 11-02-1955  HPI: Patient is in for annual physical exam. Has questions regarding recent thyroid tests and this is again reviewed, needs Endo appt. Recent labs,  are reviewed. Immunization is reviewed , and  needs to be updated.   PE: BP 135/83   Pulse 73   Temp 98.5 F (36.9 C)   Resp 18   Ht '5\' 3"'$  (1.6 m)   Wt 134 lb 0.6 oz (60.8 kg)   SpO2 96%   BMI 23.74 kg/m   Pleasant  female, alert and oriented x 3, in no cardio-pulmonary distress. Afebrile. HEENT No facial trauma or asymetry. Sinuses non tender.  Extra occullar muscles intact.. External ears normal, . Neck: supple, no adenopathy,JVD right thyroid nodule.No bruits.  Chest: Clear to ascultation bilaterally.No crackles or wheezes. Non tender to palpation  Breast: No asymetry,no masses or lumps. No tenderness. No nipple discharge or inversion. No axillary or supraclavicular adenopathy  Cardiovascular system; Heart sounds normal,  S1 and  S2 ,no S3.  No murmur, or thrill. Apical beat not displaced Peripheral pulses normal.  Abdomen: Soft, non tender, no organomegaly or masses. No bruits. Bowel sounds normal. No guarding, tenderness or rebound.    Musculoskeletal exam: Full ROM of spine, hips , shoulders and knees. No deformity ,swelling or crepitus noted. No muscle wasting or atrophy.   Neurologic: Cranial nerves 2 to 12 intact. Power, tone ,sensation and reflexes normal throughout. No disturbance in gait. No tremor.  Skin: Intact, no ulceration, erythema , scaling or rash noted. Pigmentation normal throughout  Psych; Normal mood and affect. Judgement and concentration normal   Assessment & Plan:  Annual physical exam Annual exam as documented. Counseling done  re healthy lifestyle involving commitment to 150 minutes exercise per week, heart healthy diet, and attaining healthy weight.The importance of adequate sleep also discussed. Regular seat  belt use and home safety, is also discussed. Changes in health habits are decided on by the patient with goals and time frames  set for achieving them. Immunization and cancer screening needs are specifically addressed at this visit.

## 2020-10-10 NOTE — Assessment & Plan Note (Signed)

## 2020-10-10 NOTE — Therapy (Signed)
[place], [place], [address] Phone: [phone]   Fax:  [phone]  Occupational Therapy Treatment  Patient Details  Name: Faith Hood MRN: [id number] Date of Birth: 1955/09/22 Referring Provider (OT): Arther Abbott, MD   Encounter Date: 10/09/2020   OT End of Session - 10/10/20 1326     Visit Number 5    Number of Visits 12    Date for OT Re-Evaluation 11/07/20    Authorization Type [employer] health plan    Authorization Time Period 20% co-insurance, no copay, no visit limit    OT Start Time 1345    OT Stop Time 1423    OT Time Calculation (min) 38 min    Activity Tolerance Patient tolerated treatment well    Behavior During Therapy [facility] for tasks assessed/performed             Past Medical History:  Diagnosis Date   Cerebral vascular disease    Carotid ultrasound in 10/2011: Mild plaque; tortuous vessels; no definite luminal obstruction.   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  Repeat in 2030.   DIAGNOSTIC LAPAROSCOPY  1978   Gynecologic problems    There were no vitals filed for this visit.   Subjective Assessment - 10/09/20 1402     Subjective  S: I'll be more relaxed today.    Currently in Pain? Yes    Pain Score 2     Pain Location Shoulder    Pain Orientation Right    Pain Descriptors / Indicators Sore    Pain Type Acute pain    Pain Onset In the past 7 days    Pain Frequency Intermittent    Aggravating Factors  stretches from therapy    Pain Relieving Factors has not used anything for pain    Effect of Pain on Daily Activities no effect                 [facility] OT Assessment - 10/09/20 1405       Assessment   Medical Diagnosis Right shoulder adhesive capsulitis      Precautions   Precautions None                      OT Treatments/Exercises (OP) - 10/09/20 1405       Exercises   Exercises Shoulder      Shoulder Exercises: Supine   Protraction PROM;10 reps    Horizontal ABduction PROM;10 reps    External Rotation PROM;10 reps    Internal Rotation PROM;10 reps    Flexion PROM;AAROM;Weights   2# dowel with 3" hold at end stretch   ABduction PROM;10 reps      Shoulder Exercises: ROM/Strengthening   UBE (Upper Arm Bike) Level 2 2' forward 2' reverse   pace: 5.0-6.0   Other ROM/Strengthening Exercises PVC pipe slide; flexion 10X, abduction10X                    OT Education - 10/09/20 1405     Education Details stargazer stretch  Person(s) Educated Patient    Methods Explanation;Demonstration;Verbal cues;Handout    Comprehension Returned demonstration;Verbalized understanding              OT Short Term Goals - 09/30/20 1119       OT SHORT TERM GOAL #1   Title Patient will be educated and independent with HEP in order to increase functional use of her RUE during daily tasks while using it for 75% or more of needed self care tasks.    Time 3    Period Weeks    Status On-going    Target Date 10/17/20      OT SHORT TERM GOAL #2   Title Patient will increase RUE P/ROM to [facility] in order to increase her ability to complete upper body dressing tasks with less difficulty.    Time 3    Period Weeks    Status On-going      OT SHORT TERM GOAL #3   Title Patient will increase her RUE strength to 3/5 in order to complete reaching tasks at or below shoulder level with less difficulty.    Time 3    Period Weeks    Status On-going      OT SHORT TERM GOAL #4   Title Patient will report a decrease in pain of approximately 4/10 or less when using her RUE to complete low level reaching tasks.     Time 3    Period Weeks    Status On-going               OT Long Term Goals - 09/30/20 1119       OT LONG TERM GOAL #1   Title Patient will return to using her RUE as her dominant extremity for all daily tasks.    Time 6    Period Weeks    Status On-going      OT LONG TERM GOAL #2   Title Patient will increase her RUE A/ROM to [facility] in order to be able to complete mid level reachin tasks with less difficulty.    Time 6    Period Weeks    Status On-going      OT LONG TERM GOAL #3   Title Patient will increase her RUE strength to 4-/5 in order to be able to pick and hold a grocery bag at her side of medium weight (5-10lbs).    Time 6    Period Weeks    Status On-going      OT LONG TERM GOAL #4   Title Patient will decrease her RUE pain during grooming and dressing tasks of approximately 3/10 or less.    Time 6    Period Weeks    Status On-going      OT LONG TERM GOAL #5   Title Patient will decrease her right UE fascial restrictions to trace amount in order to increase the functional mobility needed to complete bathing activities that require cross body reaching.    Time 6    Period Weeks    Status On-going                   Plan - 10/10/20 1327     Clinical Impression Statement A: No fascial restrictions this date and no need for myofascial release. Focused on use of muscle energy technique to further increase ROM. Add therapy ball strengthening while using missor to provide feedback on standing posture. VC for form and technique were completed.    Body  Structure / Function / Physical Skills ADL;UE functional use;Fascial restriction;Pain;ROM;Strength    Plan P: Complete US to decrease adhesions. Continue with muscle energy technique. Myofascial release PRN. Add therapy ball circles, proximal shoulder strengthening on door if able to tolerate.    Consulted and Agree with Plan of Care Patient             Patient will benefit from skilled therapeutic  intervention in order to improve the following deficits and impairments:   Body Structure / Function / Physical Skills: ADL, UE functional use, Fascial restriction, Pain, ROM, Strength       Visit Diagnosis: Acute pain of right shoulder  Stiffness of right shoulder, not elsewhere classified  Other symptoms and signs involving the musculoskeletal system    Problem List Patient Active Problem List   Diagnosis Date Noted   Numbness of feet 08/27/2020   Immunization due 05/08/2020   Back pain with radiculopathy 11/30/2019   Spasm of muscle of lower back 11/30/2019   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) 03/25/2019   Decreased frequency of bowel movements 03/24/2019   Hemochromatosis 02/14/2019   Elevated LFTs 01/02/2019   Reduced vision 07/03/2018   Transaminitis 01/07/2018   Hypothyroidism 04/04/2014   Carotid bruit 11/29/2011   Abnormal EKG 11/10/2011   Shoulder pain, right 09/03/2010   Hyperlipidemia 05/09/2008   FIBROIDS, UTERUS 11/30/2007   Hypertension 11/30/2007   Ailene Ravel, OTR/L,CBIS  [phone]  10/10/2020, 1:29 PM  [place], [place], [address] Phone: [phone]   Fax:  [phone]  Name: Faith Hood MRN: [id number] Date of Birth: June 18, 1955

## 2020-10-14 ENCOUNTER — Ambulatory Visit: Payer: BC Managed Care – PPO | Admitting: Neurology

## 2020-10-14 ENCOUNTER — Other Ambulatory Visit: Payer: Self-pay

## 2020-10-14 ENCOUNTER — Encounter: Payer: Self-pay | Admitting: Neurology

## 2020-10-14 ENCOUNTER — Encounter (HOSPITAL_COMMUNITY): Payer: BC Managed Care – PPO | Admitting: Physical Therapy

## 2020-10-14 VITALS — BP 126/78 | HR 77 | Ht 63.0 in | Wt 137.5 lb

## 2020-10-14 DIAGNOSIS — R432 Parageusia: Secondary | ICD-10-CM | POA: Diagnosis not present

## 2020-10-14 DIAGNOSIS — G629 Polyneuropathy, unspecified: Secondary | ICD-10-CM

## 2020-10-14 MED ORDER — GABAPENTIN 100 MG PO CAPS: 100.0000 mg | ORAL_CAPSULE | Freq: Three times a day (TID) | ORAL | 1 refills | Status: DC

## 2020-10-14 NOTE — Progress Notes (Signed)
[facility]  PATIENT: Faith Hood DOB: 1955-10-05  REFERRING CLINICIAN: Fayrene Helper, MD HISTORY FROM: Patient  REASON FOR VISIT: Numbness/tingling in both feet/hands    HISTORICAL  CHIEF COMPLAINT:  Chief Complaint  Patient presents with   Dizziness    New patient:  Constant lightheadness, tingling and numbness in all hands/feet, skin sensitivity, fatigue and metallic taste of food. Room 13, alone in room    HISTORY OF PRESENT ILLNESS:  This is a 65 year old woman with past medical history of arthritis, osteoporosis on Fosamax, hypertension, hypothyroidism and hyperlipidemia who is presenting with complaint of tingling/numbness in the bilateral feet and hands for the past year and metallic taste in the mouth.  Patient said the numbness and tingling started for the past year, she feels it more at the bottom of her feet and on her toes.  The numbness tingling is constant and does not interfere with walking or driving.  She said that heating pad makes it better and sensations are far worse at night.  She denies the urge to move her leg or kick her legs around.   Patient also mentioned that she had a history of polyarthritis, he was initially on steroids for 6 months but discontinued 6 months ago.  She is also on Fosamax for osteoporosis for the past year.   With the abnormal sensation described as tingling and numbness, she also feels lightheaded.  Patient denies spinning sensation, or feeling unsteady, but she felt like she has an abnormal sensation. She describes it as feeling detached, said that she cannot function, she is in the room but she feels far away. This feeling is constant but no associated nausea, no vomiting, no falls, no other symptoms.   OTHER MEDICAL CONDITIONS: Polyarthritis, Osteoporosis, HTN, Hypothyroidism HLD,    REVIEW OF SYSTEMS: Full 14 system review of systems performed and negative with exception of: as noted in the  HPI   ALLERGIES: No Known Allergies  HOME MEDICATIONS: Outpatient Medications Prior to Visit  Medication Sig Dispense Refill   alendronate (FOSAMAX) 70 MG tablet Take 1 tablet (70 mg total) by mouth every 7 (seven) days. Take with a full glass of water on an empty stomach. 4 tablet 11   amLODipine (NORVASC) 10 MG tablet Take 1 tablet (10 mg total) by mouth daily. 90 tablet 1   amLODipine (NORVASC) 5 MG tablet Take 1 tablet (5 mg total) by mouth daily. TAKE ONE TABLET BY MOUTH DAILY EVERY EVENING 90 tablet 3   atorvastatin (LIPITOR) 20 MG tablet Take 1 tablet (20 mg total) by mouth daily. 90 tablet 3   Calcium Carbonate-Vitamin D (CALCIUM 500 + D PO) Take by mouth 2 (two) times daily.     cloNIDine (CATAPRES) 0.3 MG tablet Take one tablet by mouth at bedtime for blood pressure 30 tablet 3   levothyroxine (SYNTHROID) 50 MCG tablet Take 1 tablet (50 mcg total) by mouth daily. 90 tablet 1   losartan (COZAAR) 100 MG tablet Take 1 tablet by mouth once daily 90 tablet 0   No facility-administered medications prior to visit.    PAST MEDICAL HISTORY: Past Medical History:  Diagnosis Date   Cerebral vascular disease    Carotid ultrasound in 10/2011: Mild plaque; tortuous vessels; no definite luminal obstruction.   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  Repeat in 2030.   DIAGNOSTIC LAPAROSCOPY  1978   Gynecologic problems    FAMILY HISTORY: Family History  Problem Relation Age of Onset   Pancreatic cancer Mother 57       Diagnosed in 2009; currently in hospice pt. htn   Hypertension Mother        + Sister x4   Cancer Father 35       brain  tumor   Hypertension Sister    Hypertension Sister    Hypertension Sister    Stroke Sister    Hypertension Sister    Cancer Maternal Grandmother    Cancer Paternal Grandmother    Colon cancer Neg Hx     SOCIAL HISTORY: Social History   Socioeconomic History   Marital status: Single    Spouse name: Not on file   Number of children: 0   Years of education: Not on file   Highest education level: Not on file  Occupational History   Occupation: Middle school teacher retired    Comment: X25 years  Tobacco Use   Smoking status: Never   Smokeless tobacco: Never  Substance and Sexual Activity   Alcohol use: Not Currently    Comment: occasionally. About twice a year.    Drug use: No   Sexual activity: Not Currently  Other Topics Concern   Not on file  Social History Narrative   Lives with sister occasionally   Right Handed   Drinks caffeine occassionally   Social Determinants of Health   Financial Resource Strain: Not on file  Food Insecurity: Not on file  Transportation Needs: Not on file  Physical Activity: Not on file  Stress: Not on file  Social Connections: Not on file  Intimate Partner Violence: Not on file     PHYSICAL EXAM  GENERAL EXAM/CONSTITUTIONAL: Vitals:  Vitals:   10/14/20 1009  BP: 126/78  Pulse: 77  Weight: 137 lb 8 oz (62.4 kg)  Height: '5\' 3"'$  (1.6 m)   Body mass index is 24.36 kg/m. Wt Readings from Last 3 Encounters:  10/14/20 137 lb 8 oz (62.4 kg)  10/09/20 134 lb 0.6 oz (60.8 kg)  09/19/20 133 lb (60.3 kg)   Patient is in no distress; well developed, nourished and groomed; neck is supple  CARDIOVASCULAR: Examination of carotid arteries is normal; no carotid bruits Regular rate and rhythm, no murmurs Examination of peripheral vascular system by observation and palpation is normal  EYES: Pupils round and reactive to light, Visual fields full to confrontation, Extraocular movements intacts,   MUSCULOSKELETAL: Gait, strength, tone,  movements noted in Neurologic exam below  NEUROLOGIC: MENTAL STATUS:  awake, alert, oriented to person, place and time recent and remote memory intact normal attention and concentration language fluent, comprehension intact, naming intact fund of knowledge appropriate  CRANIAL NERVE:  2nd, 3rd, 4th, 6th - pupils equal and reactive to light, visual fields full to confrontation, extraocular muscles intact, no nystagmus 5th - facial sensation symmetric 7th - facial strength symmetric 8th - hearing intact 9th - palate elevates symmetrically, uvula midline 11th - shoulder shrug symmetric 12th - tongue protrusion midline  MOTOR:  normal bulk and tone, full strength in the BUE, BLE, limited due to joint pain  SENSORY:  normal and symmetric to light touch,  pinprick, temperature, vibration  COORDINATION:  finger-nose-finger, fine finger movements normal  REFLEXES:  deep tendon reflexes present and symmetric  GAIT/STATION:  normal   DIAGNOSTIC DATA (LABS, IMAGING, TESTING) - I reviewed patient records, labs, notes, testing and imaging myself where available.  Lab Results  Component Value Date   WBC 7.0 08/27/2020   HGB 12.7 08/27/2020   HCT 38.9 08/27/2020   MCV 86 08/27/2020   PLT 259 08/27/2020      Component Value Date/Time   NA 141 08/27/2020 1517   K 4.2 08/27/2020 1517   CL 103 08/27/2020 1517   CO2 23 08/27/2020 1517   GLUCOSE 84 08/27/2020 1517   GLUCOSE 97 11/28/2019 1449   BUN 14 08/27/2020 1517   CREATININE 0.80 08/27/2020 1517   CREATININE 0.79 11/28/2019 1449   CALCIUM 10.4 (H) 08/27/2020 1517   PROT 7.7 08/27/2020 1517   ALBUMIN 5.0 (H) 08/27/2020 1517   AST 31 08/27/2020 1517   ALT 27 08/27/2020 1517   ALKPHOS 89 08/27/2020 1517   BILITOT 0.4 08/27/2020 1517   GFRNONAA 80 11/28/2019 1449   GFRAA 93 11/28/2019 1449   Lab Results  Component Value Date   CHOL 172 08/27/2020   HDL 42 08/27/2020   LDLCALC 111 (H) 08/27/2020   TRIG 104 08/27/2020    CHOLHDL 4.1 08/27/2020   Lab Results  Component Value Date   HGBA1C 5.5 06/23/2018   No results found for: VITAMINB12 Lab Results  Component Value Date   TSH 5.550 (H) 08/27/2020     ASSESSMENT AND PLAN  65 y.o. year old female with past medical history of osteoporosis, polyarthritis, hypothyroidism and hyperlipidemia who is presenting for bilateral lower extremity numbness and tingling.  Patient said the symptoms have been going on for the past year, they are constant and worse at night.  She denies any need to move her legs in order to improve her symptoms.  She denies any additional signs or symptoms of restless leg syndrome.  On exam she has mild decreased sensation to pinprick, otherwise normal to vibration and light touch consistent with mild neuropathy.  I will start her on a low-dose gabapentin.  Medication review indicated she is on Fosamax which have side effect of dysgeusia.  We discussed the side effect of Fosamax and she reports for fact the day after she takes her Fosamax the metallic taste is worse.  Advised her to follow-up with her primary care doctor to see if there is any additional medications that can be given to decrease the sensation of dysgeusia.  I will see her in 6 months, we will obtain a B12 level to make sure it is not contributing to her neuropathy.   1. Neuropathy     PLAN: Continue your current medications  Start Gabapentin 100 mg at night, can increase up to 3 times daily as tolerated  Will check a B12 level today.  Return in 6 months    Orders Placed This Encounter  Procedures   Vitamin B12    Meds ordered this encounter  Medications   gabapentin (NEURONTIN) 100 MG capsule    Sig: Take 1 capsule (100 mg total) by mouth 3 (three) times daily.    Dispense:  90 capsule    Refill:  1    Return in about 6 months (around 04/16/2021).    Alric Ran, MD 10/14/2020, 1:11 PM  [facility] [address], [place], [place] [address] [phone]

## 2020-10-14 NOTE — Patient Instructions (Signed)
Continue your current medications  Start Gabapentin 100 mg at night  Can increase up to 3 times daily as tolerated  Return in 6 months

## 2020-10-15 ENCOUNTER — Encounter (HOSPITAL_COMMUNITY): Payer: BC Managed Care – PPO | Admitting: Occupational Therapy

## 2020-10-15 LAB — VITAMIN B12: Vitamin B-12: 453 pg/mL (ref 232–1245)

## 2020-10-16 ENCOUNTER — Encounter (HOSPITAL_COMMUNITY): Payer: Self-pay | Admitting: Occupational Therapy

## 2020-10-16 ENCOUNTER — Ambulatory Visit (HOSPITAL_COMMUNITY): Payer: BC Managed Care – PPO | Admitting: Occupational Therapy

## 2020-10-16 ENCOUNTER — Other Ambulatory Visit: Payer: Self-pay

## 2020-10-16 ENCOUNTER — Encounter (HOSPITAL_COMMUNITY): Payer: BC Managed Care – PPO | Admitting: Physical Therapy

## 2020-10-16 DIAGNOSIS — M25611 Stiffness of right shoulder, not elsewhere classified: Secondary | ICD-10-CM

## 2020-10-16 DIAGNOSIS — M25511 Pain in right shoulder: Secondary | ICD-10-CM

## 2020-10-16 DIAGNOSIS — R29898 Other symptoms and signs involving the musculoskeletal system: Secondary | ICD-10-CM

## 2020-10-16 NOTE — Therapy (Signed)
[place], [place], [address] Phone: [phone]   Fax:  [phone]  Occupational Therapy Treatment  Patient Details  Name: Faith Hood MRN: [id number] Date of Birth: [birth year] Referring Provider (OT): Arther Abbott, MD   Encounter Date: 10/16/2020   OT End of Session - 10/16/20 0924     Visit Number 6    Number of Visits 12    Date for OT Re-Evaluation 11/07/20    Authorization Type [employer] health plan    Authorization Time Period 20% co-insurance, no copay, no visit limit    OT Start Time 0819    OT Stop Time 0858    OT Time Calculation (min) 39 min    Activity Tolerance Patient tolerated treatment well    Behavior During Therapy [facility] for tasks assessed/performed             Past Medical History:  Diagnosis Date   Cerebral vascular disease    Carotid ultrasound in 10/2011: Mild plaque; tortuous vessels; no definite luminal obstruction.   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  Repeat in 2030.   DIAGNOSTIC LAPAROSCOPY  1978   Gynecologic problems    There were no vitals filed for this visit.   Subjective Assessment - 10/16/20 0820     Subjective  S: I can reach a little higher than I used to.    Currently in Pain? No/denies                [facility] OT Assessment - 10/16/20 0820       Assessment   Medical Diagnosis Right shoulder adhesive capsulitis      Precautions   Precautions None                      OT Treatments/Exercises (OP) - 10/16/20 0836       Exercises   Exercises Shoulder      Shoulder Exercises: Supine   Protraction PROM;10  reps    Horizontal ABduction PROM;10 reps    External Rotation PROM;10 reps    Internal Rotation PROM;10 reps    Flexion PROM;AAROM;Weights   2# dowel rod with 3" hold at end stretch   ABduction PROM;10 reps      Shoulder Exercises: Pulleys   Flexion 1 minute    ABduction 1 minute      Shoulder Exercises: Therapy Ball   Right/Left 5 reps   each direction     Shoulder Exercises: ROM/Strengthening   UBE (Upper Arm Bike) Level 2 2' forward 2' reverse   pace: 5.0   Wall Wash 1'    Other ROM/Strengthening Exercises PVC pipe slide; flexion 10X    Other ROM/Strengthening Exercises proximal shoulder strengthening on doorway using washcloth, 1' flexion      Modalities   Modalities Ultrasound      Ultrasound   Ultrasound Location right shoulder    Ultrasound Parameters 1.5 w/cm2    Ultrasound Goals Other (Comment)   decrease adhesions  OT Short Term Goals - 09/30/20 1119       OT SHORT TERM GOAL #1   Title Patient will be educated and independent with HEP in order to increase functional use of her RUE during daily tasks while using it for 75% or more of needed self care tasks.    Time 3    Period Weeks    Status On-going    Target Date 10/17/20      OT SHORT TERM GOAL #2   Title Patient will increase RUE P/ROM to [facility] in order to increase her ability to complete upper body dressing tasks with less difficulty.    Time 3    Period Weeks    Status On-going      OT SHORT TERM GOAL #3   Title Patient will increase her RUE strength to 3/5 in order to complete reaching tasks at or below shoulder level with less difficulty.    Time 3    Period Weeks    Status On-going      OT SHORT TERM GOAL #4   Title Patient will report a decrease in pain of approximately 4/10 or less when using her RUE to complete low level reaching tasks.    Time 3    Period Weeks    Status On-going               OT Long Term Goals - 09/30/20 1119       OT LONG TERM  GOAL #1   Title Patient will return to using her RUE as her dominant extremity for all daily tasks.    Time 6    Period Weeks    Status On-going      OT LONG TERM GOAL #2   Title Patient will increase her RUE A/ROM to [facility] in order to be able to complete mid level reachin tasks with less difficulty.    Time 6    Period Weeks    Status On-going      OT LONG TERM GOAL #3   Title Patient will increase her RUE strength to 4-/5 in order to be able to pick and hold a grocery bag at her side of medium weight (5-10lbs).    Time 6    Period Weeks    Status On-going      OT LONG TERM GOAL #4   Title Patient will decrease her RUE pain during grooming and dressing tasks of approximately 3/10 or less.    Time 6    Period Weeks    Status On-going      OT LONG TERM GOAL #5   Title Patient will decrease her right UE fascial restrictions to trace amount in order to increase the functional mobility needed to complete bathing activities that require cross body reaching.    Time 6    Period Weeks    Status On-going                   Plan - 10/16/20 [id number]     Clinical Impression Statement A: [place] completed at beginning of session to address adhesions. Pt reporting no pain today, she feels she is using it more at home. Added therapy ball circles and proximal shoulder strengthening on doorway, pulleys. Verbal cuing for form and technique, cuing for depressing trapezius during session.    Body Structure / Function / Physical Skills ADL;UE functional use;Fascial restriction;Pain;ROM;Strength    Plan P: Resume shoulder stretching, continue working on depressing trapezius during exercises.  OT Home Exercise Plan eval: table slides; seated scapular row and extension 8/10: shoulder stretches: flexion, abduction, cross body 8/15: doorway stretch    Consulted and Agree with Plan of Care Patient             Patient will benefit from skilled therapeutic intervention in order to improve the  following deficits and impairments:   Body Structure / Function / Physical Skills: ADL, UE functional use, Fascial restriction, Pain, ROM, Strength       Visit Diagnosis: Acute pain of right shoulder  Stiffness of right shoulder, not elsewhere classified  Other symptoms and signs involving the musculoskeletal system    Problem List Patient Active Problem List   Diagnosis Date Noted   Numbness of feet 08/27/2020   Immunization due 05/08/2020   Back pain with radiculopathy 11/30/2019   Spasm of muscle of lower back 11/30/2019   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[place]) 03/25/2019   Decreased frequency of bowel movements 03/24/2019   Hemochromatosis 02/14/2019   Elevated LFTs 01/02/2019   Reduced vision 07/03/2018   Transaminitis 01/07/2018   Hypothyroidism 04/04/2014   Annual physical exam 04/04/2014   Carotid bruit 11/29/2011   Abnormal EKG 11/10/2011   Shoulder pain, right 09/03/2010   Hyperlipidemia 05/09/2008   FIBROIDS, UTERUS 11/30/2007   Hypertension 11/30/2007   Guadelupe Sabin, OTR/L  [phone] 10/16/2020, 9:28 AM  [place] [address] [place], [place], [address] Phone: [phone]   Fax:  [phone]  Name: Faith Hood MRN: [id number] Date of Birth: 05-03-1955

## 2020-10-17 ENCOUNTER — Encounter (HOSPITAL_COMMUNITY): Payer: Self-pay | Admitting: Occupational Therapy

## 2020-10-17 ENCOUNTER — Ambulatory Visit (HOSPITAL_COMMUNITY): Payer: BC Managed Care – PPO | Admitting: Occupational Therapy

## 2020-10-17 DIAGNOSIS — M25611 Stiffness of right shoulder, not elsewhere classified: Secondary | ICD-10-CM

## 2020-10-17 DIAGNOSIS — R29898 Other symptoms and signs involving the musculoskeletal system: Secondary | ICD-10-CM

## 2020-10-17 DIAGNOSIS — M25511 Pain in right shoulder: Secondary | ICD-10-CM

## 2020-10-17 NOTE — Therapy (Signed)
[place], [place], [address] Phone: [phone]   Fax:  [phone]  Occupational Therapy Treatment  Patient Details  Name: Faith Hood MRN: [id number] Date of Birth: Jun 19, 1955 Referring Provider (OT): Arther Abbott, MD   Encounter Date: 10/17/2020   OT End of Session - 10/17/20 1424     Visit Number 7    Number of Visits 12    Date for OT Re-Evaluation 11/07/20    Authorization Type [employer] health plan    Authorization Time Period 20% co-insurance, no copay, no visit limit    OT Start Time 1350    OT Stop Time 1428    OT Time Calculation (min) 38 min    Activity Tolerance Patient tolerated treatment well    Behavior During Therapy [facility] for tasks assessed/performed             Past Medical History:  Diagnosis Date   Cerebral vascular disease    Carotid ultrasound in 10/2011: Mild plaque; tortuous vessels; no definite luminal obstruction.   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  Repeat in 2030.   DIAGNOSTIC LAPAROSCOPY  1978   Gynecologic problems    There were no vitals filed for this visit.   Subjective Assessment - 10/17/20 1351     Subjective  S: It's getting better by the day I think.    Currently in Pain? No/denies                [facility] OT Assessment - 10/17/20 1350       Assessment   Medical Diagnosis Right shoulder adhesive capsulitis      Precautions   Precautions None                      OT Treatments/Exercises (OP) - 10/17/20 1352       Exercises   Exercises Shoulder      Shoulder Exercises: Supine   Protraction PROM;10 reps     Horizontal ABduction PROM;10 reps    External Rotation PROM;10 reps    Internal Rotation PROM;10 reps    Flexion PROM;AAROM;Weights   2# dowel rod with 3" hold at end range   ABduction PROM;10 reps      Shoulder Exercises: Standing   Protraction AROM;10 reps    Horizontal ABduction AROM;10 reps    Flexion AROM;10 reps      Shoulder Exercises: ROM/Strengthening   Other ROM/Strengthening Exercises ball pass, behind back for IR, behind head for er, 10X using tennis ball      Shoulder Exercises: Stretch   Internal Rotation Stretch 2 reps   20" holds, horizontal towel   Wall Stretch - Flexion 2 reps;20 seconds    Wall Stretch - ABduction 2 reps;20 seconds      Modalities   Modalities Ultrasound      Ultrasound   Ultrasound Location right shoulder    Ultrasound Parameters 1.5 W/cm2    Ultrasound Goals Other (Comment)   decrease adhesions  OT Short Term Goals - 09/30/20 1119       OT SHORT TERM GOAL #1   Title Patient will be educated and independent with HEP in order to increase functional use of her RUE during daily tasks while using it for 75% or more of needed self care tasks.    Time 3    Period Weeks    Status On-going    Target Date 10/17/20      OT SHORT TERM GOAL #2   Title Patient will increase RUE P/ROM to [facility] in order to increase her ability to complete upper body dressing tasks with less difficulty.    Time 3    Period Weeks    Status On-going      OT SHORT TERM GOAL #3   Title Patient will increase her RUE strength to 3/5 in order to complete reaching tasks at or below shoulder level with less difficulty.    Time 3    Period Weeks    Status On-going      OT SHORT TERM GOAL #4   Title Patient will report a decrease in pain of approximately 4/10 or less when using her RUE to complete low level reaching tasks.    Time 3    Period Weeks    Status On-going               OT Long Term Goals - 09/30/20 1119       OT  LONG TERM GOAL #1   Title Patient will return to using her RUE as her dominant extremity for all daily tasks.    Time 6    Period Weeks    Status On-going      OT LONG TERM GOAL #2   Title Patient will increase her RUE A/ROM to [facility] in order to be able to complete mid level reachin tasks with less difficulty.    Time 6    Period Weeks    Status On-going      OT LONG TERM GOAL #3   Title Patient will increase her RUE strength to 4-/5 in order to be able to pick and hold a grocery bag at her side of medium weight (5-10lbs).    Time 6    Period Weeks    Status On-going      OT LONG TERM GOAL #4   Title Patient will decrease her RUE pain during grooming and dressing tasks of approximately 3/10 or less.    Time 6    Period Weeks    Status On-going      OT LONG TERM GOAL #5   Title Patient will decrease her right UE fascial restrictions to trace amount in order to increase the functional mobility needed to complete bathing activities that require cross body reaching.    Time 6    Period Weeks    Status On-going                   Plan - 10/17/20 1418     Clinical Impression Statement A: Continued with [place] at beginning of session, passive stretching completed. Resumed shoulder stretches at wall, added ball pass for er/IR. Completed A/ROM in standing. Continued to emphasize depressing trapezius, verbal cuing for form and technique.    Body Structure / Function / Physical Skills ADL;UE functional use;Fascial restriction;Pain;ROM;Strength    Plan P:  continue working on depressing trapezius during exercises, trial ball on wall    OT Home Exercise Plan eval: table  slides; seated scapular row and extension 8/10: shoulder stretches: flexion, abduction, cross body 8/15: doorway stretch    Consulted and Agree with Plan of Care Patient             Patient will benefit from skilled therapeutic intervention in order to improve the following deficits and impairments:   Body  Structure / Function / Physical Skills: ADL, UE functional use, Fascial restriction, Pain, ROM, Strength       Visit Diagnosis: Acute pain of right shoulder  Stiffness of right shoulder, not elsewhere classified  Other symptoms and signs involving the musculoskeletal system    Problem List Patient Active Problem List   Diagnosis Date Noted   Numbness of feet 08/27/2020   Immunization due 05/08/2020   Back pain with radiculopathy 11/30/2019   Spasm of muscle of lower back 11/30/2019   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[place]) 03/25/2019   Decreased frequency of bowel movements 03/24/2019   Hemochromatosis 02/14/2019   Elevated LFTs 01/02/2019   Reduced vision 07/03/2018   Transaminitis 01/07/2018   Hypothyroidism 04/04/2014   Annual physical exam 04/04/2014   Carotid bruit 11/29/2011   Abnormal EKG 11/10/2011   Shoulder pain, right 09/03/2010   Hyperlipidemia 05/09/2008   FIBROIDS, UTERUS 11/30/2007   Hypertension 11/30/2007    Guadelupe Sabin, OTR/L  [phone]  10/17/2020, 2:29 PM  [place], [place], [address] Phone: [phone]   Fax:  [phone]  Name: Faith Hood MRN: [id number] Date of Birth: [birth year]

## 2020-10-19 ENCOUNTER — Other Ambulatory Visit: Payer: Self-pay | Admitting: Family Medicine

## 2020-10-22 ENCOUNTER — Encounter (HOSPITAL_COMMUNITY): Payer: BC Managed Care – PPO | Admitting: Physical Therapy

## 2020-10-22 ENCOUNTER — Encounter (HOSPITAL_COMMUNITY): Payer: Self-pay

## 2020-10-22 ENCOUNTER — Ambulatory Visit (HOSPITAL_COMMUNITY): Payer: BC Managed Care – PPO

## 2020-10-22 ENCOUNTER — Other Ambulatory Visit: Payer: Self-pay

## 2020-10-22 DIAGNOSIS — R29898 Other symptoms and signs involving the musculoskeletal system: Secondary | ICD-10-CM | POA: Diagnosis not present

## 2020-10-22 DIAGNOSIS — M25611 Stiffness of right shoulder, not elsewhere classified: Secondary | ICD-10-CM

## 2020-10-22 DIAGNOSIS — M25511 Pain in right shoulder: Secondary | ICD-10-CM

## 2020-10-22 NOTE — Therapy (Signed)
Faith [place], [place], [address] Phone: [phone]   Fax:  [phone]  Occupational Therapy Treatment  Patient Details  Name: Faith Hood MRN: [id number] Date of Birth: [birth year] Referring Provider (OT): Arther Abbott, MD   Encounter Date: 10/22/2020   OT End of Session - 10/22/20 1541     Visit Number 8    Number of Visits 12    Date for OT Re-Evaluation 11/07/20    Authorization Type [employer] health plan    Authorization Time Period 20% co-insurance, no copay, no visit limit    OT Start Time 1345    OT Stop Time 1430    OT Time Calculation (min) 45 min    Activity Tolerance Patient tolerated treatment well    Behavior During Therapy [facility] for tasks assessed/performed             Past Medical History:  Diagnosis Date   Cerebral vascular disease    Carotid ultrasound in 10/2011: Mild plaque; tortuous vessels; no definite luminal obstruction.   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  Repeat in 2030.   DIAGNOSTIC LAPAROSCOPY  1978   Gynecologic problems    There were no vitals filed for this visit.   Subjective Assessment - 10/22/20 1429     Subjective  S: It's not painful; it just feels a little achy sometimes.    Currently in Pain? No/denies                [facility] OT Assessment - 10/22/20 1428       Assessment   Medical Diagnosis Right shoulder adhesive capsulitis      Precautions   Precautions None                      OT Treatments/Exercises (OP) - 10/22/20 1423       Exercises   Exercises Shoulder      Shoulder Exercises: Supine    Protraction PROM;10 reps    Horizontal ABduction PROM;10 reps    External Rotation PROM;10 reps    Internal Rotation PROM;10 reps    Flexion PROM;10 reps    ABduction PROM;10 reps      Shoulder Exercises: ROM/Strengthening   UBE (Upper Arm Bike) Level 2 2' forward 2' reverse   pace:   Ball on Wall 1' flexion 1' abduction green ball      Modalities   Modalities Ultrasound      Ultrasound   Ultrasound Location right shoulder ( arm abducted and externally rotated)    Ultrasound Parameters 1.5 W/cm2    Ultrasound Goals Other (Comment)   decrease adhesions                     OT Short Term Goals - 09/30/20 1119       OT SHORT TERM GOAL #1   Title Patient will be educated and independent with HEP in order to increase functional use of her RUE during daily tasks while using it for 75% or more of needed self care tasks.  Time 3    Period Weeks    Status On-going    Target Date 10/17/20      OT SHORT TERM GOAL #2   Title Patient will increase RUE P/ROM to [facility] in order to increase her ability to complete upper body dressing tasks with less difficulty.    Time 3    Period Weeks    Status On-going      OT SHORT TERM GOAL #3   Title Patient will increase her RUE strength to 3/5 in order to complete reaching tasks at or below shoulder level with less difficulty.    Time 3    Period Weeks    Status On-going      OT SHORT TERM GOAL #4   Title Patient will report a decrease in pain of approximately 4/10 or less when using her RUE to complete low level reaching tasks.    Time 3    Period Weeks    Status On-going               OT Long Term Goals - 09/30/20 1119       OT LONG TERM GOAL #1   Title Patient will return to using her RUE as her dominant extremity for all daily tasks.    Time 6    Period Weeks    Status On-going      OT LONG TERM GOAL #2   Title Patient will increase her RUE A/ROM to [facility] in order to be able to complete mid level reachin tasks  with less difficulty.    Time 6    Period Weeks    Status On-going      OT LONG TERM GOAL #3   Title Patient will increase her RUE strength to 4-/5 in order to be able to pick and hold a grocery bag at her side of medium weight (5-10lbs).    Time 6    Period Weeks    Status On-going      OT LONG TERM GOAL #4   Title Patient will decrease her RUE pain during grooming and dressing tasks of approximately 3/10 or less.    Time 6    Period Weeks    Status On-going      OT LONG TERM GOAL #5   Title Patient will decrease her right UE fascial restrictions to trace amount in order to increase the functional mobility needed to complete bathing activities that require cross body reaching.    Time 6    Period Weeks    Status On-going                   Plan - 10/22/20 1542     Clinical Impression Statement A: Continued with [place] to help decreased adhesions followed by passive stretching. patient continues to have limited shoulder flexion although has progressed since initial start of therapy. Added ball on the wall to increase shoulder and scapular strenght. VC for form and technique were provided. Minimal trapezius elevation noted during session.    Body Structure / Function / Physical Skills ADL;UE functional use;Fascial restriction;Pain;ROM;Strength    Plan P: Continue to work on increasing ROM. Overhead lacing.    Consulted and Agree with Plan of Care Patient             Patient will benefit from skilled therapeutic intervention in order to improve the following deficits and impairments:   Body Structure / Function / Physical Skills: ADL, UE functional use, Fascial restriction,  Pain, ROM, Strength       Visit Diagnosis: Stiffness of right shoulder, not elsewhere classified  Other symptoms and signs involving the musculoskeletal system  Acute pain of right shoulder    Problem List Patient Active Problem List   Diagnosis Date Noted   Numbness of feet 08/27/2020    Immunization due 05/08/2020   Back pain with radiculopathy 11/30/2019   Spasm of muscle of lower back 11/30/2019   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[place]) 03/25/2019   Decreased frequency of bowel movements 03/24/2019   Hemochromatosis 02/14/2019   Elevated LFTs 01/02/2019   Reduced vision 07/03/2018   Transaminitis 01/07/2018   Hypothyroidism 04/04/2014   Annual physical exam 04/04/2014   Carotid bruit 11/29/2011   Abnormal EKG 11/10/2011   Shoulder pain, right 09/03/2010   Hyperlipidemia 05/09/2008   FIBROIDS, UTERUS 11/30/2007   Hypertension 11/30/2007    Ailene Ravel, OTR/L,CBIS  [phone]  10/22/2020, 4:09 PM  [place], [place], [address] Phone: [phone]   Fax:  [phone]  Name: Faith Hood MRN: [id number] Date of Birth: 1955-10-13

## 2020-10-23 ENCOUNTER — Ambulatory Visit (HOSPITAL_COMMUNITY): Payer: BC Managed Care – PPO

## 2020-10-23 ENCOUNTER — Encounter (HOSPITAL_COMMUNITY): Payer: Self-pay

## 2020-10-23 DIAGNOSIS — M25511 Pain in right shoulder: Secondary | ICD-10-CM

## 2020-10-23 DIAGNOSIS — R29898 Other symptoms and signs involving the musculoskeletal system: Secondary | ICD-10-CM

## 2020-10-23 DIAGNOSIS — M25611 Stiffness of right shoulder, not elsewhere classified: Secondary | ICD-10-CM

## 2020-10-23 NOTE — Therapy (Signed)
[address] [place], [place], [address] Phone: [phone]   Fax:  [phone]  Occupational Therapy Treatment  Patient Details  Name: Faith Hood MRN: [id number] Date of Birth: 1955-05-06 Referring Provider (OT): Arther Abbott, MD   Encounter Date: 10/23/2020   OT End of Session - 10/23/20 1338     Visit Number 9    Number of Visits 12    Date for OT Re-Evaluation 11/07/20    Authorization Type [employer] health plan    Authorization Time Period 20% co-insurance, no copay, no visit limit    OT Start Time 1300    OT Stop Time 1340    OT Time Calculation (min) 40 min    Activity Tolerance Patient tolerated treatment well    Behavior During Therapy [facility] for tasks assessed/performed             Past Medical History:  Diagnosis Date   Cerebral vascular disease    Carotid ultrasound in 10/2011: Mild plaque; tortuous vessels; no definite luminal obstruction.   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  Repeat in 2030.   DIAGNOSTIC LAPAROSCOPY  1978   Gynecologic problems    There were no vitals filed for this visit.   Subjective Assessment - 10/23/20 1333     Subjective  S: Still a little achy like usual.    Currently in Pain? No/denies                [facility] OT Assessment - 10/23/20 1334       Assessment   Medical Diagnosis Right shoulder adhesive capsulitis      Precautions   Precautions None                      OT Treatments/Exercises (OP) - 10/23/20 1334       Exercises   Exercises Shoulder      Shoulder Exercises: Supine   Protraction PROM;10 reps     Horizontal ABduction PROM;10 reps    External Rotation PROM;10 reps    Internal Rotation PROM;10 reps    Flexion PROM;10 reps    ABduction PROM;10 reps      Shoulder Exercises: ROM/Strengthening   Over Head Lace seated. laced chain starting at top to bottom then unlaced.      Shoulder Exercises: Stretch   Wall Stretch - Flexion 2 reps;30 seconds    Other Shoulder Stretches Doorway stretch; 2X30"      Modalities   Modalities Ultrasound      Ultrasound   Ultrasound Location right shoulder (shoulder abducted and externally rotated)    Ultrasound Parameters 1.5 W/cm2    Ultrasound Goals Other (Comment)   decrease adhesions                     OT Short Term Goals - 09/30/20 1119       OT SHORT TERM GOAL #1   Title Patient will be educated and independent with HEP in order to increase functional use of her RUE during daily tasks while using it for  75% or more of needed self care tasks.    Time 3    Period Weeks    Status On-going    Target Date 10/17/20      OT SHORT TERM GOAL #2   Title Patient will increase RUE P/ROM to [facility] in order to increase her ability to complete upper body dressing tasks with less difficulty.    Time 3    Period Weeks    Status On-going      OT SHORT TERM GOAL #3   Title Patient will increase her RUE strength to 3/5 in order to complete reaching tasks at or below shoulder level with less difficulty.    Time 3    Period Weeks    Status On-going      OT SHORT TERM GOAL #4   Title Patient will report a decrease in pain of approximately 4/10 or less when using her RUE to complete low level reaching tasks.    Time 3    Period Weeks    Status On-going               OT Long Term Goals - 09/30/20 1119       OT LONG TERM GOAL #1   Title Patient will return to using her RUE as her dominant extremity for all daily tasks.    Time 6    Period Weeks    Status On-going      OT LONG TERM GOAL #2   Title Patient will increase her RUE  A/ROM to [facility] in order to be able to complete mid level reachin tasks with less difficulty.    Time 6    Period Weeks    Status On-going      OT LONG TERM GOAL #3   Title Patient will increase her RUE strength to 4-/5 in order to be able to pick and hold a grocery bag at her side of medium weight (5-10lbs).    Time 6    Period Weeks    Status On-going      OT LONG TERM GOAL #4   Title Patient will decrease her RUE pain during grooming and dressing tasks of approximately 3/10 or less.    Time 6    Period Weeks    Status On-going      OT LONG TERM GOAL #5   Title Patient will decrease her right UE fascial restrictions to trace amount in order to increase the functional mobility needed to complete bathing activities that require cross body reaching.    Time 6    Period Weeks    Status On-going                   Plan - 10/23/20 1338     Clinical Impression Statement A: Conitnued with [place] to decrease adhesions in right AC joint prior to passive stretching. Shoulder stretches completed in additional to overhead lacing to focus on scapular and shoulder stability. VC for form and technique were provided. Mild scapular elevation noted during stretches.    Body Structure / Function / Physical Skills ADL;UE functional use;Fascial restriction;Pain;ROM;Strength    Plan P: Continue to focus on increase ROM.             Patient will benefit from skilled therapeutic intervention in order to improve the following deficits and impairments:   Body Structure / Function / Physical Skills: ADL, UE functional use, Fascial restriction, Pain, ROM, Strength       Visit  Diagnosis: Other symptoms and signs involving the musculoskeletal system  Stiffness of right shoulder, not elsewhere classified  Acute pain of right shoulder    Problem List Patient Active Problem List   Diagnosis Date Noted   Numbness of feet 08/27/2020   Immunization due 05/08/2020   Back pain with radiculopathy  11/30/2019   Spasm of muscle of lower back 11/30/2019   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[place]) 03/25/2019   Decreased frequency of bowel movements 03/24/2019   Hemochromatosis 02/14/2019   Elevated LFTs 01/02/2019   Reduced vision 07/03/2018   Transaminitis 01/07/2018   Hypothyroidism 04/04/2014   Annual physical exam 04/04/2014   Carotid bruit 11/29/2011   Abnormal EKG 11/10/2011   Shoulder pain, right 09/03/2010   Hyperlipidemia 05/09/2008   FIBROIDS, UTERUS 11/30/2007   Hypertension 11/30/2007    Ailene Ravel, OTR/L,CBIS  [phone]  10/23/2020, 1:45 PM  [place] [address] [place], [place], [address] Phone: [phone]   Fax:  [phone]  Name: Faith Hood MRN: [id number] Date of Birth: January 25, 1956

## 2020-10-24 ENCOUNTER — Ambulatory Visit (HOSPITAL_COMMUNITY): Payer: BC Managed Care – PPO | Admitting: Occupational Therapy

## 2020-10-24 ENCOUNTER — Encounter (HOSPITAL_COMMUNITY): Payer: BC Managed Care – PPO | Admitting: Physical Therapy

## 2020-10-29 ENCOUNTER — Encounter (HOSPITAL_COMMUNITY): Payer: BC Managed Care – PPO

## 2020-10-29 ENCOUNTER — Other Ambulatory Visit: Payer: Self-pay

## 2020-10-29 ENCOUNTER — Ambulatory Visit (HOSPITAL_COMMUNITY): Payer: BC Managed Care – PPO | Attending: Orthopedic Surgery

## 2020-10-29 ENCOUNTER — Encounter (HOSPITAL_COMMUNITY): Payer: Self-pay

## 2020-10-29 DIAGNOSIS — M25511 Pain in right shoulder: Secondary | ICD-10-CM | POA: Insufficient documentation

## 2020-10-29 DIAGNOSIS — M25611 Stiffness of right shoulder, not elsewhere classified: Secondary | ICD-10-CM | POA: Insufficient documentation

## 2020-10-29 DIAGNOSIS — R29898 Other symptoms and signs involving the musculoskeletal system: Secondary | ICD-10-CM | POA: Insufficient documentation

## 2020-10-29 NOTE — Therapy (Signed)
[place], [place], [address] Phone: [phone]   Fax:  [phone]  Occupational Therapy Treatment  Patient Details  Name: Faith Hood MRN: [id number] Date of Birth: 16-May-1955 Referring Provider (OT): Arther Abbott, MD   Encounter Date: 10/29/2020   OT End of Session - 10/29/20 1435     Visit Number 10    Number of Visits 12    Date for OT Re-Evaluation 11/07/20    Authorization Type [employer] health plan    Authorization Time Period 20% co-insurance, no copay, no visit limit    OT Start Time 1345    OT Stop Time 1427    OT Time Calculation (min) 42 min    Activity Tolerance Patient tolerated treatment well    Behavior During Therapy [facility] for tasks assessed/performed             Past Medical History:  Diagnosis Date   Cerebral vascular disease    Carotid ultrasound in 10/2011: Mild plaque; tortuous vessels; no definite luminal obstruction.   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  Repeat in 2030.   DIAGNOSTIC LAPAROSCOPY  1978   Gynecologic problems    There were no vitals filed for this visit.   Subjective Assessment - 10/29/20 1411     Subjective  S: I'm able to change the calendar and reach up higher on the board now.    Currently in Pain? No/denies                [facility] OT Assessment - 10/29/20 1412       Assessment   Medical Diagnosis Right shoulder adhesive capsulitis      Precautions   Precautions None                      OT Treatments/Exercises (OP) - 10/29/20 1414       Exercises   Exercises Shoulder      Shoulder Exercises: Supine    Protraction PROM;10 reps    Horizontal ABduction PROM;10 reps    External Rotation PROM;10 reps    Internal Rotation PROM;10 reps    Flexion PROM;10 reps    ABduction PROM;10 reps      Shoulder Exercises: Standing   Extension Theraband;12 reps    Theraband Level (Shoulder Extension) Level 3 (Green)    Row Theraband;12 reps    Theraband Level (Shoulder Row) Level 3 (Green)      Shoulder Exercises: Therapy Ball   Other Therapy Ball Exercises proximal shoulder strengthening green ball; flexion, chest press, circles (right/left) 12X      Functional Reaching Activities   High Level [place] used to focus on functional reaching task. Placed while focusing on flexion; removed in abduction      Modalities   Modalities Ultrasound      Ultrasound   Ultrasound Location right shoulder (shoulder abducted and externally rotated)    Ultrasound Parameters 1.5 W/cm2    Ultrasound Goals Other (Comment)   decrease adhesions  OT Short Term Goals - 09/30/20 1119       OT SHORT TERM GOAL #1   Title Patient will be educated and independent with HEP in order to increase functional use of her RUE during daily tasks while using it for 75% or more of needed self care tasks.    Time 3    Period Weeks    Status On-going    Target Date 10/17/20      OT SHORT TERM GOAL #2   Title Patient will increase RUE P/ROM to [facility] in order to increase her ability to complete upper body dressing tasks with less difficulty.    Time 3    Period Weeks    Status On-going      OT SHORT TERM GOAL #3   Title Patient will increase her RUE strength to 3/5 in order to complete reaching tasks at or below shoulder level with less difficulty.    Time 3    Period Weeks    Status On-going      OT SHORT TERM GOAL #4   Title Patient will report a decrease in pain of approximately 4/10 or less when using her RUE to complete low level reaching tasks.    Time 3    Period Weeks    Status  On-going               OT Long Term Goals - 09/30/20 1119       OT LONG TERM GOAL #1   Title Patient will return to using her RUE as her dominant extremity for all daily tasks.    Time 6    Period Weeks    Status On-going      OT LONG TERM GOAL #2   Title Patient will increase her RUE A/ROM to [facility] in order to be able to complete mid level reachin tasks with less difficulty.    Time 6    Period Weeks    Status On-going      OT LONG TERM GOAL #3   Title Patient will increase her RUE strength to 4-/5 in order to be able to pick and hold a grocery bag at her side of medium weight (5-10lbs).    Time 6    Period Weeks    Status On-going      OT LONG TERM GOAL #4   Title Patient will decrease her RUE pain during grooming and dressing tasks of approximately 3/10 or less.    Time 6    Period Weeks    Status On-going      OT LONG TERM GOAL #5   Title Patient will decrease her right UE fascial restrictions to trace amount in order to increase the functional mobility needed to complete bathing activities that require cross body reaching.    Time 6    Period Weeks    Status On-going                   Plan - 10/29/20 1636     Clinical Impression Statement A: Focused on functional reaching during flexion and abduction. Added scapular strengthening exercises using green band. [place] utilized to decrease adhesions in the right AC joint prior to passive stretching. VC for form and technique were provided during session.    Body Structure / Function / Physical Skills ADL;UE functional use;Fascial restriction;Pain;ROM;Strength    Plan P: Add scapular retraction with band.    Consulted and Agree with Plan of Care Patient  Patient will benefit from skilled therapeutic intervention in order to improve the following deficits and impairments:   Body Structure / Function / Physical Skills: ADL, UE functional use, Fascial restriction, Pain, ROM, Strength        Visit Diagnosis: Acute pain of right shoulder  Stiffness of right shoulder, not elsewhere classified  Other symptoms and signs involving the musculoskeletal system    Problem List Patient Active Problem List   Diagnosis Date Noted   Numbness of feet 08/27/2020   Immunization due 05/08/2020   Back pain with radiculopathy 11/30/2019   Spasm of muscle of lower back 11/30/2019   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 [place], [place], [address] Phone: [phone]   Fax:  [phone]  Name: HIFZA SCHWARK MRN: [id number] Date of Birth: 02-Dec-1955

## 2020-10-31 ENCOUNTER — Encounter (HOSPITAL_COMMUNITY): Payer: Self-pay | Admitting: Occupational Therapy

## 2020-10-31 ENCOUNTER — Other Ambulatory Visit: Payer: Self-pay

## 2020-10-31 ENCOUNTER — Ambulatory Visit (HOSPITAL_COMMUNITY): Payer: BC Managed Care – PPO | Admitting: Occupational Therapy

## 2020-10-31 ENCOUNTER — Encounter (HOSPITAL_COMMUNITY): Payer: BC Managed Care – PPO | Admitting: Physical Therapy

## 2020-10-31 DIAGNOSIS — M25611 Stiffness of right shoulder, not elsewhere classified: Secondary | ICD-10-CM

## 2020-10-31 DIAGNOSIS — M25511 Pain in right shoulder: Secondary | ICD-10-CM | POA: Diagnosis not present

## 2020-10-31 DIAGNOSIS — R29898 Other symptoms and signs involving the musculoskeletal system: Secondary | ICD-10-CM

## 2020-10-31 NOTE — Therapy (Signed)
[place], [place], [address] Phone: [phone]   Fax:  [phone]  Occupational Therapy Treatment  Patient Details  Name: Faith Hood MRN: [id number] Date of Birth: Jun 14, 1955 Referring Provider (OT): Arther Abbott, MD   Encounter Date: 10/31/2020   OT End of Session - 10/31/20 1341     Visit Number 11    Number of Visits 12    Date for OT Re-Evaluation 11/07/20    Authorization Type [employer] health plan    Authorization Time Period 20% co-insurance, no copay, no visit limit    OT Start Time 1301    OT Stop Time 1339    OT Time Calculation (min) 38 min    Activity Tolerance Patient tolerated treatment well    Behavior During Therapy [facility] for tasks assessed/performed             Past Medical History:  Diagnosis Date   Cerebral vascular disease    Carotid ultrasound in 10/2011: Mild plaque; tortuous vessels; no definite luminal obstruction.   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)    Uterine leiomyoma    By CT scan    Past Surgical History:  Procedure Laterality Date   COLONOSCOPY  12/2007   Negative screening study   COLONOSCOPY N/A 08/17/2018   Procedure: COLONOSCOPY;  Surgeon: Daneil Dolin, MD; normal exam.  Repeat in 2030.   DIAGNOSTIC LAPAROSCOPY  1978   Gynecologic problems    There were no vitals filed for this visit.   Subjective Assessment - 10/31/20 1301     Subjective  S: It's been doing much better.    Currently in Pain? No/denies                [facility] OT Assessment - 10/31/20 1300       Assessment   Medical Diagnosis Right shoulder adhesive capsulitis      Precautions   Precautions None                      OT Treatments/Exercises (OP) - 10/31/20 1303       Exercises   Exercises Shoulder      Shoulder Exercises: Supine   Protraction PROM;10 reps     Horizontal ABduction PROM;10 reps    External Rotation PROM;10 reps    Internal Rotation PROM;10 reps    Flexion PROM;10 reps    ABduction PROM;10 reps      Shoulder Exercises: Standing   Protraction AROM;10 reps    Horizontal ABduction AROM;10 reps    Flexion AROM;10 reps    Extension Theraband;12 reps    Theraband Level (Shoulder Extension) Level 3 (Green)    Row [employer] reps    Theraband Level (Shoulder Row) Level 3 (Green)    Retraction Theraband;10 reps    Theraband Level (Shoulder Retraction) Level 3 (Green)      Shoulder Exercises: Therapy Ball   Other Therapy Ball Exercises proximal shoulder strengthening green ball; flexion, chest press, circles (right/left) 12X      Shoulder Exercises: ROM/Strengthening   Over Head Lace seated. laced chain starting at top to bottom then unlaced.    Ball on Wall 1' flexion 1' abduction green ball      Modalities   Modalities Ultrasound      Ultrasound   Ultrasound Location right shoulder (shoulder abducted and  externally rotated)    Ultrasound Parameters 1.5 W/cm2    Ultrasound Goals Other (Comment)   decrease adhesions                     OT Short Term Goals - 09/30/20 1119       OT SHORT TERM GOAL #1   Title Patient will be educated and independent with HEP in order to increase functional use of her RUE during daily tasks while using it for 75% or more of needed self care tasks.    Time 3    Period Weeks    Status On-going    Target Date 10/17/20      OT SHORT TERM GOAL #2   Title Patient will increase RUE P/ROM to [facility] in order to increase her ability to complete upper body dressing tasks with less difficulty.    Time 3    Period Weeks    Status On-going      OT SHORT TERM GOAL #3   Title Patient will increase her RUE strength to 3/5 in order to complete reaching tasks at or below shoulder level with less difficulty.    Time 3    Period Weeks    Status On-going      OT SHORT TERM GOAL #4   Title  Patient will report a decrease in pain of approximately 4/10 or less when using her RUE to complete low level reaching tasks.    Time 3    Period Weeks    Status On-going               OT Long Term Goals - 09/30/20 1119       OT LONG TERM GOAL #1   Title Patient will return to using her RUE as her dominant extremity for all daily tasks.    Time 6    Period Weeks    Status On-going      OT LONG TERM GOAL #2   Title Patient will increase her RUE A/ROM to [facility] in order to be able to complete mid level reachin tasks with less difficulty.    Time 6    Period Weeks    Status On-going      OT LONG TERM GOAL #3   Title Patient will increase her RUE strength to 4-/5 in order to be able to pick and hold a grocery bag at her side of medium weight (5-10lbs).    Time 6    Period Weeks    Status On-going      OT LONG TERM GOAL #4   Title Patient will decrease her RUE pain during grooming and dressing tasks of approximately 3/10 or less.    Time 6    Period Weeks    Status On-going      OT LONG TERM GOAL #5   Title Patient will decrease her right UE fascial restrictions to trace amount in order to increase the functional mobility needed to complete bathing activities that require cross body reaching.    Time 6    Period Weeks    Status On-going                   Plan - 10/31/20 1319     Clinical Impression Statement A: Continued with [place] at beginning of session, A/ROM in standing. Added retraction with theraband, continued with scapular strengthening with green weighted ball, therapy ball strengthening tasks. Verbal cuing for form and technique.  Body Structure / Function / Physical Skills ADL;UE functional use;Fascial restriction;Pain;ROM;Strength    Plan P: continue to improve form with scapular theraband and add to HEP    OT Home Exercise Plan eval: table slides; seated scapular row and extension 8/10: shoulder stretches: flexion, abduction, cross body 8/15: doorway  stretch    Consulted and Agree with Plan of Care Patient             Patient will benefit from skilled therapeutic intervention in order to improve the following deficits and impairments:   Body Structure / Function / Physical Skills: ADL, UE functional use, Fascial restriction, Pain, ROM, Strength       Visit Diagnosis: Acute pain of right shoulder  Stiffness of right shoulder, not elsewhere classified  Other symptoms and signs involving the musculoskeletal system    Problem List Patient Active Problem List   Diagnosis Date Noted   Numbness of feet 08/27/2020   Immunization due 05/08/2020   Back pain with radiculopathy 11/30/2019   Spasm of muscle of lower back 11/30/2019   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[place]) 03/25/2019   Decreased frequency of bowel movements 03/24/2019   Hemochromatosis 02/14/2019   Elevated LFTs 01/02/2019   Reduced vision 07/03/2018   Transaminitis 01/07/2018   Hypothyroidism 04/04/2014   Annual physical exam 04/04/2014   Carotid bruit 11/29/2011   Abnormal EKG 11/10/2011   Shoulder pain, right 09/03/2010   Hyperlipidemia 05/09/2008   FIBROIDS, UTERUS 11/30/2007   Hypertension 11/30/2007   Guadelupe Sabin, OTR/L  [phone] 10/31/2020, 1:43 PM  [place], [place], [address] Phone: [phone]   Fax:  [phone]  Name: ROSETTER LOCH MRN: [id number] Date of Birth: 06/24/1955

## 2020-11-05 ENCOUNTER — Encounter (HOSPITAL_COMMUNITY): Payer: BC Managed Care – PPO | Admitting: Physical Therapy

## 2020-11-05 ENCOUNTER — Encounter (HOSPITAL_COMMUNITY): Payer: Self-pay

## 2020-11-05 ENCOUNTER — Ambulatory Visit (HOSPITAL_COMMUNITY): Payer: BC Managed Care – PPO

## 2020-11-05 ENCOUNTER — Other Ambulatory Visit: Payer: Self-pay

## 2020-11-05 DIAGNOSIS — M25511 Pain in right shoulder: Secondary | ICD-10-CM | POA: Diagnosis not present

## 2020-11-05 DIAGNOSIS — R29898 Other symptoms and signs involving the musculoskeletal system: Secondary | ICD-10-CM

## 2020-11-05 DIAGNOSIS — M25611 Stiffness of right shoulder, not elsewhere classified: Secondary | ICD-10-CM

## 2020-11-05 NOTE — Therapy (Signed)
[place] [address] [place], [place], [address] Phone: [phone]   Fax:  [phone]  Occupational Therapy Treatment  Patient Details  Name: Faith Hood MRN: [id number] Date of Birth: 1955-08-24 Referring Provider (OT): Arther Abbott, MD   Encounter Date: 11/05/2020   OT End of Session - 11/05/20 1534     Visit Number 12    Number of Visits 12    Date for OT Re-Evaluation 11/07/20    Authorization Type [employer] health plan    Authorization Time Period 20% co-insurance, no copay, no visit limit    OT Start Time 1350    OT Stop Time 1428    OT Time Calculation (min) 38 min    Activity Tolerance Patient tolerated treatment well    Behavior During Therapy [facility] for tasks assessed/performed             Past Medical History:  Diagnosis Date   Cerebral vascular disease    Carotid ultrasound in 10/2011: Mild plaque; tortuous vessels; no definite luminal obstruction.   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  Repeat in 2030.   DIAGNOSTIC LAPAROSCOPY  1978   Gynecologic problems    There were no vitals filed for this visit.   Subjective Assessment - 11/05/20 1438     Subjective  S: It's sore if I do certain movements with it but right now it's fine.    Currently in Pain? No/denies                [facility] OT Assessment - 11/05/20 1439       Assessment   Medical Diagnosis Right shoulder adhesive capsulitis      Precautions   Precautions None                      OT Treatments/Exercises (OP) - 11/05/20 1439       Exercises   Exercises Shoulder      Shoulder Exercises: Supine    Protraction PROM;10 reps    Horizontal ABduction PROM;10 reps    External Rotation PROM;10 reps    Internal Rotation PROM;10 reps    Flexion PROM;10 reps    ABduction PROM;10 reps      Shoulder Exercises: Standing   Extension Theraband;12 reps    Theraband Level (Shoulder Extension) Level 2 (Red)    Row Theraband;12 reps   palms up   Theraband Level (Shoulder Row) Level 2 (Red)    Retraction Theraband;12 reps    Theraband Level (Shoulder Retraction) Level 2 (Red)      Modalities   Modalities Ultrasound      Ultrasound   Ultrasound Location right shoulder (shoulder abducted and externally rotated)    Ultrasound Parameters 1.5 W/cm2    Ultrasound Goals Other (Comment)   decrease adhesions                     OT Short Term Goals - 09/30/20 1119       OT SHORT TERM GOAL #1   Title Patient will be educated and independent with  HEP in order to increase functional use of her RUE during daily tasks while using it for 75% or more of needed self care tasks.    Time 3    Period Weeks    Status On-going    Target Date 10/17/20      OT SHORT TERM GOAL #2   Title Patient will increase RUE P/ROM to [facility] in order to increase her ability to complete upper body dressing tasks with less difficulty.    Time 3    Period Weeks    Status On-going      OT SHORT TERM GOAL #3   Title Patient will increase her RUE strength to 3/5 in order to complete reaching tasks at or below shoulder level with less difficulty.    Time 3    Period Weeks    Status On-going      OT SHORT TERM GOAL #4   Title Patient will report a decrease in pain of approximately 4/10 or less when using her RUE to complete low level reaching tasks.    Time 3    Period Weeks    Status On-going               OT Long Term Goals - 09/30/20 1119       OT LONG TERM GOAL #1   Title Patient will return to using her RUE as her dominant extremity for all daily tasks.    Time 6    Period Weeks    Status  On-going      OT LONG TERM GOAL #2   Title Patient will increase her RUE A/ROM to [facility] in order to be able to complete mid level reachin tasks with less difficulty.    Time 6    Period Weeks    Status On-going      OT LONG TERM GOAL #3   Title Patient will increase her RUE strength to 4-/5 in order to be able to pick and hold a grocery bag at her side of medium weight (5-10lbs).    Time 6    Period Weeks    Status On-going      OT LONG TERM GOAL #4   Title Patient will decrease her RUE pain during grooming and dressing tasks of approximately 3/10 or less.    Time 6    Period Weeks    Status On-going      OT LONG TERM GOAL #5   Title Patient will decrease her right UE fascial restrictions to trace amount in order to increase the functional mobility needed to complete bathing activities that require cross body reaching.    Time 6    Period Weeks    Status On-going                   Plan - 11/05/20 1618     Clinical Impression Statement A: [place] completed at start of session to break up adhesions. Passive stretching completed while progressing ROM as able. Pt continues to be sensitive to passive external rotation although demonstrates functional range. Completed scapular theraband for scapular strengthening although unable to complete with proper form and technique. provided direct supervision with verbal and tactile cues to adjust form. Did not provide exercises for HEP as they wouldn't benefit patient and she was unable to demonstrate consistency with her form and technique.    Body Structure / Function / Physical Skills ADL;UE functional use;Fascial restriction;Pain;ROM;Strength    Plan P: Reassessment. Discharge if appropriate  unless more OT is needed.    Consulted and Agree with Plan of Care Patient             Patient will benefit from skilled therapeutic intervention in order to improve the following deficits and impairments:   Body Structure / Function / Physical  Skills: ADL, UE functional use, Fascial restriction, Pain, ROM, Strength       Visit Diagnosis: Other symptoms and signs involving the musculoskeletal system  Acute pain of right shoulder  Stiffness of right shoulder, not elsewhere classified    Problem List Patient Active Problem List   Diagnosis Date Noted   Numbness of feet 08/27/2020   Immunization due 05/08/2020   Back pain with radiculopathy 11/30/2019   Spasm of muscle of lower back 11/30/2019   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[place]) 03/25/2019   Decreased frequency of bowel movements 03/24/2019   Hemochromatosis 02/14/2019   Elevated LFTs 01/02/2019   Reduced vision 07/03/2018   Transaminitis 01/07/2018   Hypothyroidism 04/04/2014   Annual physical exam 04/04/2014   Carotid bruit 11/29/2011   Abnormal EKG 11/10/2011   Shoulder pain, right 09/03/2010   Hyperlipidemia 05/09/2008   FIBROIDS, UTERUS 11/30/2007   Hypertension 11/30/2007    Ailene Ravel, OTR/L,CBIS  [phone]  11/05/2020, 4:35 PM  [place], [place], [address] Phone: [phone]   Fax:  [phone]  Name: Faith Hood MRN: [id number] Date of Birth: 21-Feb-1956

## 2020-11-06 ENCOUNTER — Encounter (HOSPITAL_COMMUNITY): Payer: Self-pay | Admitting: Occupational Therapy

## 2020-11-06 ENCOUNTER — Ambulatory Visit (HOSPITAL_COMMUNITY): Payer: BC Managed Care – PPO | Admitting: Occupational Therapy

## 2020-11-06 DIAGNOSIS — M25611 Stiffness of right shoulder, not elsewhere classified: Secondary | ICD-10-CM

## 2020-11-06 DIAGNOSIS — M25511 Pain in right shoulder: Secondary | ICD-10-CM | POA: Diagnosis not present

## 2020-11-06 DIAGNOSIS — R29898 Other symptoms and signs involving the musculoskeletal system: Secondary | ICD-10-CM

## 2020-11-06 NOTE — Therapy (Addendum)
[facility] [address] [place], [place], [address] Phone: [phone]   Fax:  [phone]  Occupational Therapy Reassessment and Treatment, Discharge Summary  Patient Details  Name: NALINI ALCARAZ MRN: [id number] Date of Birth: July 26, 1955 Referring Provider (OT): Arther Abbott, MD     [facility] OT Assessment - 11/06/20 [phone]       Assessment   Medical Diagnosis Right shoulder adhesive capsulitis      Precautions   Precautions None      Observation/Other Assessments   Focus on Therapeutic Outcomes (FOTO)  80/100   45/100 previous     Palpation   Palpation comment Min fascial restrictions palpated in right medial deltoid and upper trapezius region.      AROM   Overall AROM Comments Assessed seated. IR/er adducted    AROM Assessment Site Shoulder    Right/Left Shoulder Right    Right Shoulder Flexion 120 Degrees   90 previous   Right Shoulder ABduction 114 Degrees   61 previous   Right Shoulder Internal Rotation 90 Degrees   same as previous   Right Shoulder External Rotation 72 Degrees   60 previous     PROM   Overall PROM Comments Assessed supine. IR/er adducted.    PROM Assessment Site Shoulder    Right/Left Shoulder Right    Right Shoulder Flexion 130 Degrees   106 previous   Right Shoulder ABduction 135 Degrees   95 previous   Right Shoulder Internal Rotation 90 Degrees   same as previous   Right Shoulder External Rotation 71 Degrees   70 previous     Strength   Overall Strength Comments Assessed seated. IR/er adducted    Strength Assessment Site Shoulder    Right/Left Shoulder Right    Right Shoulder Flexion 4/5   3-/5 previous   Right Shoulder ABduction 4/5   3-/5 previous   Right Shoulder Internal Rotation 4/5   3-/5 previous   Right Shoulder External Rotation 4/5   3-/5 previous             Encounter Date: 11/06/2020   OT End of Session - 11/06/20 1015     Visit Number 13    Number of Visits 12    Date  for OT Re-Evaluation 11/07/20    Authorization Type [employer] health plan    Authorization Time Period 20% co-insurance, no copay, no visit limit    OT Start Time 0946    OT Stop Time 1015    OT Time Calculation (min) 29 min    Activity Tolerance Patient tolerated treatment well    Behavior During Therapy WFL for tasks assessed/performed             Past Medical History:  Diagnosis Date   Cerebral vascular disease    Carotid ultrasound in 10/2011: Mild plaque; tortuous vessels; no definite luminal obstruction.   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  Repeat in 2030.   DIAGNOSTIC LAPAROSCOPY  1978   Gynecologic problems    There were no vitals filed for this visit.   Subjective Assessment -  11/06/20 0947     Subjective  S:    Currently in Pain? No/denies                        OT Treatments/Exercises (OP) - 11/06/20 0948       Exercises   Exercises Shoulder      Shoulder Exercises: Standing   Protraction AROM;10 reps    Horizontal ABduction AROM;10 reps    External Rotation AROM;10 reps    Internal Rotation AROM;10 reps    Flexion AROM;10 reps    ABduction AROM;10 reps                    OT Education - 11/06/20 1004     Education Details A/ROM shoulder    Person(s) Educated Patient    Methods Explanation;Demonstration;Verbal cues;Handout    Comprehension Returned demonstration;Verbalized understanding              OT Short Term Goals - 11/06/20 0959       OT SHORT TERM GOAL #1   Title Patient will be educated and independent with HEP in order to increase functional use of her RUE during daily tasks while  using it for 75% or more of needed self care tasks.    Time 3    Period Weeks    Status Achieved    Target Date 10/17/20      OT SHORT TERM GOAL #2   Title Patient will increase RUE P/ROM to [facility] in order to increase her ability to complete upper body dressing tasks with less difficulty.    Time 3    Period Weeks    Status Partially Met      OT SHORT TERM GOAL #3   Title Patient will increase her RUE strength to 3/5 in order to complete reaching tasks at or below shoulder level with less difficulty.    Time 3    Period Weeks    Status Achieved      OT SHORT TERM GOAL #4   Title Patient will report a decrease in pain of approximately 4/10 or less when using her RUE to complete low level reaching tasks.    Time 3    Period Weeks    Status Achieved               OT Long Term Goals - 11/06/20 1000       OT LONG TERM GOAL #1   Title Patient will return to using her RUE as her dominant extremity for all daily tasks.    Time 6    Period Weeks    Status Achieved      OT LONG TERM GOAL #2   Title Patient will increase her RUE A/ROM to [facility] in order to be able to complete mid level reachin tasks with less difficulty.    Time 6    Period Weeks    Status Partially Met      OT LONG TERM GOAL #3   Title Patient will increase her RUE strength to 4-/5 in order to be able to pick and hold a grocery bag at her side of medium weight (5-10lbs).    Time 6    Period Weeks    Status Achieved      OT LONG TERM GOAL #4   Title Patient will decrease her RUE pain during grooming and dressing tasks of approximately 3/10 or less.    Time 6  Period Weeks    Status Achieved      OT LONG TERM GOAL #5   Title Patient will decrease her right UE fascial restrictions to trace amount in order to increase the functional mobility needed to complete bathing activities that require cross body reaching.    Time 6    Period Weeks    Status Not Met                   Plan - 11/06/20  1007     Clinical Impression Statement A: Reassessment completed this session, pt has met 3/4 STGs and partially met remaining STG, 3/5 LTGs and 1 additional goal partially met. Pt has made good progress towards goals demonstrating improved ROM, strength, and functional use of RUE as dominant. Pt reports she continues to have difficulty reaching behind her back and is working on this at home with her stretches. Pt reports very minimal pain with functional use. Added A/ROM to HEP and reviewed with pt. Pt is agreeable to discharge today with HEP.    Body Structure / Function / Physical Skills ADL;UE functional use;Fascial restriction;Pain;ROM;Strength    Plan P: Discharge pt    OT Home Exercise Plan eval: table slides; seated scapular row and extension 8/10: shoulder stretches: flexion, abduction, cross body 8/15: doorway stretch; 9/14: A/ROM shoulder    Consulted and Agree with Plan of Care Patient             Patient will benefit from skilled therapeutic intervention in order to improve the following deficits and impairments:   Body Structure / Function / Physical Skills: ADL, UE functional use, Fascial restriction, Pain, ROM, Strength       Visit Diagnosis: Other symptoms and signs involving the musculoskeletal system  Acute pain of right shoulder  Stiffness of right shoulder, not elsewhere classified    Problem List Patient Active Problem List   Diagnosis Date Noted   Numbness of feet 08/27/2020   Immunization due 05/08/2020   Back pain with radiculopathy 11/30/2019   Spasm of muscle of lower back 11/30/2019   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 [place], [place], [address] Phone: [phone]   Fax:  [phone]  Name: MEGA KINKADE MRN: [id number] Date of Birth: Jul 13, 1955   OCCUPATIONAL THERAPY DISCHARGE SUMMARY  Visits from Start of Care: 13  Current functional level related to goals / functional outcomes: See above. Pt demonstrates improved functioning in ADLs using RUE as dominant. Pt with significantly reduced pain during ADLs and housework tasks.    Remaining deficits: Continues to have difficulty with IR behind back   Education / Equipment: HEP for shoulder stretches and A/ROM   Patient agrees to discharge. Patient goals were met. Patient is being discharged due to meeting the stated rehab goals.Marland Kitchen

## 2020-11-06 NOTE — Patient Instructions (Addendum)

## 2020-11-07 ENCOUNTER — Encounter (HOSPITAL_COMMUNITY): Payer: BC Managed Care – PPO | Admitting: Occupational Therapy

## 2020-11-07 ENCOUNTER — Encounter (HOSPITAL_COMMUNITY): Payer: BC Managed Care – PPO | Admitting: Physical Therapy

## 2020-11-12 NOTE — Patient Instructions (Signed)

## 2020-11-13 ENCOUNTER — Other Ambulatory Visit: Payer: Self-pay

## 2020-11-13 ENCOUNTER — Ambulatory Visit: Payer: BC Managed Care – PPO | Admitting: Nurse Practitioner

## 2020-11-13 ENCOUNTER — Encounter: Payer: Self-pay | Admitting: Nurse Practitioner

## 2020-11-13 VITALS — BP 155/79 | HR 72 | Ht 63.0 in | Wt 136.8 lb

## 2020-11-13 DIAGNOSIS — E038 Other specified hypothyroidism: Secondary | ICD-10-CM

## 2020-11-13 DIAGNOSIS — E042 Nontoxic multinodular goiter: Secondary | ICD-10-CM | POA: Diagnosis not present

## 2020-11-13 NOTE — Progress Notes (Signed)
Endocrinology Consult Note                                         11/13/2020, 1:55 PM  Subjective:   Subjective    Faith Hood is a 65 y.o.-year-old female patient being seen in consultation for hypothyroidism referred by Fayrene Helper, MD.   Past Medical History:  Diagnosis Date   Cerebral vascular disease    Carotid ultrasound in 10/2011: Mild plaque; tortuous vessels; no definite luminal obstruction.   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  Repeat in 2030.   DIAGNOSTIC LAPAROSCOPY  1978   Gynecologic problems    Social History   Socioeconomic History   Marital status: Single    Spouse name: Not on file   Number of children: 0   Years of education: Not on file   Highest education level: Not on file  Occupational History   Occupation: Middle school teacher retired    Comment: X25 years  Tobacco Use   Smoking status: Never   Smokeless tobacco: Never  Vaping Use   Vaping Use: Never used  Substance and Sexual Activity   Alcohol use: Not Currently    Comment: occasionally. About twice a year.    Drug use: No   Sexual activity: Not Currently  Other Topics Concern   Not on file  Social History Narrative   Lives with sister occasionally   Right Handed   Drinks caffeine occassionally   Social Determinants of Health   Financial Resource Strain: Not on file  Food Insecurity: Not on file  Transportation Needs: Not on file  Physical Activity: Not on file  Stress: Not on file  Social Connections: Not on file    Family History  Problem Relation Age of Onset   Pancreatic  cancer Mother 82       Diagnosed in 2009; currently in hospice pt. htn   Hypertension Mother        + Sister x4   Cancer Father 10       brain tumor   Hypertension Sister    Hypertension Sister    Hypertension Sister    Stroke Sister    Hypertension Sister    Cancer Maternal Grandmother    Cancer Paternal Grandmother    Colon cancer Neg Hx     Outpatient Encounter Medications as of 11/13/2020  Medication Sig   alendronate (FOSAMAX) 70 MG tablet Take 1 tablet (70 mg total) by mouth every 7 (seven) days. Take with a full glass of water on an empty stomach.   amLODipine (NORVASC) 10 MG tablet Take 1 tablet by  mouth once daily   amLODipine (NORVASC) 5 MG tablet Take 1 tablet (5 mg total) by mouth daily. TAKE ONE TABLET BY MOUTH DAILY EVERY EVENING   atorvastatin (LIPITOR) 10 MG tablet 1 tablet   Calcium Carbonate-Vitamin D (CALCIUM 500 + D PO) Take by mouth 2 (two) times daily.   cloNIDine (CATAPRES) 0.3 MG tablet Take one tablet by mouth at bedtime for blood pressure   gabapentin (NEURONTIN) 100 MG capsule Take 1 capsule (100 mg total) by mouth 3 (three) times daily.   levothyroxine (SYNTHROID) 50 MCG tablet Take 1 tablet (50 mcg total) by mouth daily.   losartan (COZAAR) 100 MG tablet Take 1 tablet by mouth once daily   [DISCONTINUED] atorvastatin (LIPITOR) 20 MG tablet Take 1 tablet (20 mg total) by mouth daily. (Patient not taking: Reported on 11/13/2020)   No facility-administered encounter medications on file as of 11/13/2020.    ALLERGIES: No Known Allergies VACCINATION STATUS: Immunization History  Administered Date(s) Administered   Influenza Split 12/29/2010, 11/10/2011   Influenza Whole 01/24/2009, 12/17/2009   Influenza,inj,Quad PF,6+ Mos 12/14/2012, 01/31/2014, 01/09/2015, 02/04/2016, 11/23/2016, 12/06/2017, 12/12/2018, 12/26/2019   Janssen (J&J) SARS-COV-2 Vaccination 11/28/2019   Moderna Sars-Covid-2 Vaccination 10/30/2019, 04/28/2020   PPD Test 12/14/2012,  12/19/2012   Td 12/17/2009   Tdap 05/08/2020   Zoster Recombinat (Shingrix) 02/03/2018, 06/27/2018     HPI   Faith Hood  is a patient with the above medical history. she was diagnosed with hypothyroidism at approximate age of 16 years (newly diagnosed in March 2022), which required subsequent initiation of thyroid hormone supplementation. she was given various doses of Levothyroxine since, currently on 50 micrograms. she reports compliance to this medication:  Taking it daily on empty stomach  with water, separated by >30 minutes before breakfast and other medications , and by at least 4 hours from calcium, iron, PPIs, multivitamins.  I reviewed patient's thyroid tests:  Lab Results  Component Value Date   TSH 5.550 (H) 08/27/2020   TSH 4.440 06/10/2020   TSH 42.500 (H) 05/01/2020   TSH 1.38 11/28/2019   TSH 7.20 (H) 06/23/2018   TSH 2.49 01/04/2018   TSH 2.76 11/20/2016   TSH 2.79 02/04/2016   TSH 1.60 09/16/2015   TSH 2.988 01/07/2015   FREET4 0.93 08/27/2020   FREET4 1.04 06/10/2020   FREET4 0.9 06/23/2018   FREET4 0.67 (L) 03/31/2014   She also had thyroid ultrasound in 08/2020 which showed multiple small nodules bilaterally, none of which met criteria for dedicated follow up or biopsy.  Pt describes: - fatigue - dry skin - dizziness  Pt denies feeling nodules in neck, hoarseness, dysphagia/odynophagia, SOB with lying down.  she does family history of thyroid disorders in her sister (unsure of which but knows it was surgically removed).  She does report her sister was unable to tolerate the generic form of the hormone and has done well with branded Synthroid.  No family history of thyroid cancer.  No history of radiation therapy to head or neck.  No recent use of iodine supplements.  Denies use of Biotin containing supplements.  I reviewed her chart and she also has a history of HTN, HLD, transaminitis.   ROS:  Constitutional: no weight gain/loss, + fatigue, no  subjective hyperthermia, no subjective hypothermia Eyes: no blurry vision, no xerophthalmia ENT: no sore throat, no nodules palpated in throat, no dysphagia/odynophagia, no hoarseness Cardiovascular: no chest pain, no SOB, no palpitations, no leg swelling Respiratory: no cough, no SOB Gastrointestinal: no  nausea/vomiting/diarrhea Musculoskeletal: no muscle/joint aches Skin: no rashes, + dry skin Neurological: no tremors, no numbness, no tingling, + constant dizziness Psychiatric: no depression, no anxiety   Objective:   Objective     BP (!) 155/79   Pulse 72   Ht _0  (1.6 m)   Wt 136 lb 12.8 oz (62.1 kg)   BMI 24.23 kg/m  Wt Readings from Last 3 Encounters:  11/13/20 136 lb 12.8 oz (62.1 kg)  10/14/20 137 lb 8 oz (62.4 kg)  10/09/20 134 lb 0.6 oz (60.8 kg)    BP Readings from Last 3 Encounters:  11/13/20 (!) 155/79  10/14/20 126/78  10/09/20 135/83     Constitutional:  Body mass index is 24.23 kg/m., not in acute distress, mildly anxious state of mind Eyes: PERRLA, EOMI, no exophthalmos ENT: moist mucous membranes, no thyromegaly, no cervical lymphadenopathy Cardiovascular: normal precordial activity, RRR, no murmur/rubs/gallops Respiratory:  adequate breathing efforts, no gross chest deformity, Clear to auscultation bilaterally Gastrointestinal: abdomen soft, non-tender, no distension, bowel sounds present Musculoskeletal: no gross deformities, strength intact in all four extremities Skin: moist, warm, no rashes Neurological: slight tremor with outstretched hands, deep tendon reflexes normal in BLE.   CMP ( most recent) CMP     Component Value Date/Time   NA 141 08/27/2020 1517   K 4.2 08/27/2020 1517   CL 103 08/27/2020 1517   CO2 23 08/27/2020 1517   GLUCOSE 84 08/27/2020 1517   GLUCOSE 97 11/28/2019 1449   BUN 14 08/27/2020 1517   CREATININE 0.80 08/27/2020 1517   CREATININE 0.79 11/28/2019 1449   CALCIUM 10.4 (H) 08/27/2020 1517   PROT 7.7  08/27/2020 1517   ALBUMIN 5.0 (H) 08/27/2020 1517   AST 31 08/27/2020 1517   ALT 27 08/27/2020 1517   ALKPHOS 89 08/27/2020 1517   BILITOT 0.4 08/27/2020 1517   GFRNONAA 80 11/28/2019 1449   GFRAA 93 11/28/2019 1449     Diabetic Labs (most recent): Lab Results  Component Value Date   HGBA1C 5.5 06/23/2018   HGBA1C 5.4 05/15/2016   HGBA1C 5.4 02/04/2016     Lipid Panel ( most recent) Lipid Panel     Component Value Date/Time   CHOL 172 08/27/2020 1517   TRIG 104 08/27/2020 1517   HDL 42 08/27/2020 1517   CHOLHDL 4.1 08/27/2020 1517   CHOLHDL 3.4 09/14/2019 1340   VLDL 16 05/15/2016 1531   LDLCALC 111 (H) 08/27/2020 1517   LDLCALC 143 (H) 09/14/2019 1340   LABVLDL 19 08/27/2020 1517       Lab Results  Component Value Date   TSH 5.550 (H) 08/27/2020   TSH 4.440 06/10/2020   TSH 42.500 (H) 05/01/2020   TSH 1.38 11/28/2019   TSH 7.20 (H) 06/23/2018   TSH 2.49 01/04/2018   TSH 2.76 11/20/2016   TSH 2.79 02/04/2016   TSH 1.60 09/16/2015   TSH 2.988 01/07/2015   FREET4 0.93 08/27/2020   FREET4 1.04 06/10/2020   FREET4 0.9 06/23/2018   FREET4 0.67 (L) 03/31/2014    Thyroid US from 09/06/20 CLINICAL DATA:  Hypothyroidism   EXAM: THYROID ULTRASOUND   TECHNIQUE: Ultrasound examination of the thyroid gland and adjacent soft tissues was performed.   COMPARISON:  07/19/2018 and previous back to 04/09/2014   FINDINGS: Parenchymal Echotexture: Moderately heterogenous   Isthmus: 1.4 cm thickness, previously 1.2   Right lobe: 6.1 x 1.2 x 2.3 cm, previously 4.7 x 2.4 x 2.7   Left lobe: 5.9 x 3 x 2.6  cm, previously 5 x 2.5 x 2.3   _________________________________________________________   Estimated total number of nodules >/= 1 cm: 3   Number of spongiform nodules >/=  2 cm not described below (TR1): 0   Number of mixed cystic and solid nodules >/= 1.5 cm not described below ([place]): 0   _________________________________________________________   Nodule #  1:   Prior biopsy: No   Location: Right; superior   Maximum size: 1.6 cm; Other 2 dimensions: 1.1 x 1.4 cm, previously, 1.4 x 1.2 x 1.4 cm   Composition: solid/almost completely solid (2)   Echogenicity: hyperechoic (1)   Shape: not taller-than-wide (0)   Margins: smooth (0)   Echogenic foci: none (0)   ACR TI-RADS total points: 3.   ACR TI-RADS risk category:  TR3 (3 points).   Significant change in size (>/= 20% in two dimensions and minimal increase of 2 mm): No   Change in features: No   Change in ACR TI-RADS risk category: No   ACR TI-RADS recommendations:   *Given size (>/= 1.5 - 2.4 cm) and appearance, a follow-up ultrasound in 1 year should be considered based on TI-RADS criteria.   _________________________________________________________   Nodule # 2: 0.6 cm calcification, mid left, stable; This nodule does NOT meet TI-RADS criteria for biopsy or dedicated follow-up.   _________________________________________________________   Nodule # 3:   Location: Left; superior   Maximum size: 1.2 cm; Other 2 dimensions: 0.8 x 0.9 cm   Composition: solid/almost completely solid (2)   Echogenicity: hyperechoic (1)   Shape: not taller-than-wide (0)   Margins: smooth (0)   Echogenic foci: none (0)   ACR TI-RADS total points: 3.   ACR TI-RADS risk category: TR3 (3 points).   ACR TI-RADS recommendations:   Given size (<1.4 cm) and appearance, this nodule does NOT meet TI-RADS criteria for biopsy or dedicated follow-up.   _________________________________________________________   Nodule # 4:   Location: Isthmus; right of midline   Maximum size: 1.2 cm; Other 2 dimensions: 1 x 0.8 cm   Composition: solid/almost completely solid (2)   Echogenicity: isoechoic (1)   Shape: not taller-than-wide (0)   Margins: ill-defined (0)   Echogenic foci: none (0)   ACR TI-RADS total points: 3.   ACR TI-RADS risk category: TR3 (3 points).   ACR TI-RADS  recommendations:   Given size (<1.4 cm) and appearance, this nodule does NOT meet TI-RADS criteria for biopsy or dedicated follow-up.   IMPRESSION: 1. Enlarged heterogenous thyroid with nodules as above. None meet criteria for biopsy. 2. Recommend annual/biennial ultrasound follow-up of right nodule as above, until stability x5 years confirmed.   The above is in keeping with the ACR TI-RADS recommendations - J Am Coll Radiol 2017;14:587-595.     Electronically Signed   By: Lucrezia Europe M.D.   On: 09/07/2020 08:13  Assessment & Plan:   ASSESSMENT / PLAN:  1. Hypothyroidism-unspecified- suspect r/t Hashimoto's given positive family history   Patient with relatively new hypothyroidism, on levothyroxine therapy. On physical exam, patient  does not have gross goiter, thyroid nodules, or neck compression symptoms.  She is advised to continue her Levothyroxine at 50 mcg po daily before breakfast.  Will have her return in 1 week to discuss labs and adjust dose if necessary.  - We discussed about correct intake of levothyroxine, at fasting, with water, separated by at least 30 minutes from breakfast, and separated by more than 4 hours from calcium, iron, multivitamins, acid reflux medications (PPIs). -  Patient is made aware of the fact that thyroid hormone replacement is needed for life, dose to be adjusted by periodic monitoring of thyroid function tests.  - Will check thyroid tests before next visit: TSH, free T4, Free T3, and antibody testing for autoimmune thyroid dysfunction.    - Time spent with the patient: 45 minutes, of which >50% was spent in obtaining information about her symptoms, reviewing her previous labs, evaluations, and treatments, counseling her about her hypothyroidism, and developing a plan to confirm the diagnosis and long term treatment as necessary. Please refer to "Patient Self Inventory" in the Media tab for reviewed elements of pertinent patient history.  Daleisa P  Yardley participated in the discussions, expressed understanding, and voiced agreement with the above plans.  All questions were answered to her satisfaction. she is encouraged to contact clinic should she have any questions or concerns prior to her return visit.   FOLLOW UP PLAN:  Return in about 1 week (around 11/20/2020) for Thyroid follow up, Previsit labs.  Rayetta Pigg, [facility] [address] [place], [place] [address] Phone: [phone] Fax: [phone]  11/13/2020, 1:55 PM

## 2020-11-14 LAB — THYROID PEROXIDASE ANTIBODY: Thyroperoxidase Ab SerPl-aCnc: >600 IU/mL — ABNORMAL HIGH (ref 0–34)

## 2020-11-14 LAB — TSH: TSH: 6.1 u[IU]/mL — ABNORMAL HIGH (ref 0.450–4.500)

## 2020-11-14 LAB — THYROGLOBULIN ANTIBODY: Thyroglobulin Antibody: 1435.2 IU/mL — ABNORMAL HIGH (ref 0.0–0.9)

## 2020-11-14 LAB — T3, FREE: T3, Free: 2.9 pg/mL (ref 2.0–4.4)

## 2020-11-14 LAB — T4, FREE: Free T4: 0.83 ng/dL (ref 0.82–1.77)

## 2020-11-15 ENCOUNTER — Other Ambulatory Visit: Payer: Self-pay | Admitting: Family Medicine

## 2020-11-21 ENCOUNTER — Other Ambulatory Visit: Payer: Self-pay

## 2020-11-21 ENCOUNTER — Encounter: Payer: Self-pay | Admitting: Nurse Practitioner

## 2020-11-21 ENCOUNTER — Ambulatory Visit (INDEPENDENT_AMBULATORY_CARE_PROVIDER_SITE_OTHER): Payer: BC Managed Care – PPO | Admitting: Nurse Practitioner

## 2020-11-21 VITALS — BP 140/87 | HR 58 | Ht 63.0 in | Wt 138.0 lb

## 2020-11-21 DIAGNOSIS — E038 Other specified hypothyroidism: Secondary | ICD-10-CM

## 2020-11-21 DIAGNOSIS — E063 Autoimmune thyroiditis: Secondary | ICD-10-CM | POA: Diagnosis not present

## 2020-11-21 DIAGNOSIS — E042 Nontoxic multinodular goiter: Secondary | ICD-10-CM

## 2020-11-21 MED ORDER — LEVOTHYROXINE SODIUM 75 MCG PO TABS: 75.0000 ug | ORAL_TABLET | Freq: Every day | ORAL | 1 refills | Status: DC

## 2020-11-21 NOTE — Progress Notes (Signed)
Endocrinology Follow Up Note                                         11/21/2020, 2:24 PM  Subjective:   Subjective    Faith Hood is a 65 y.o.-year-old female patient being seen in follow up after being seen in consultation for hypothyroidism referred by Fayrene Helper, MD.   Past Medical History:  Diagnosis Date   Cerebral vascular disease    Carotid ultrasound in 10/2011: Mild plaque; tortuous vessels; no definite luminal obstruction.   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  Repeat in 2030.   DIAGNOSTIC LAPAROSCOPY  1978   Gynecologic problems    Social History   Socioeconomic History   Marital status: Single    Spouse name: Not on file   Number of children: 0   Years of education: Not on file   Highest education level: Not on file  Occupational History   Occupation: Middle school teacher retired    Comment: X25 years  Tobacco Use   Smoking status: Never   Smokeless tobacco: Never  Vaping Use   Vaping Use: Never used  Substance and Sexual Activity   Alcohol use: Not Currently    Comment: occasionally. About twice a year.    Drug use: No   Sexual activity: Not Currently  Other Topics Concern   Not on file  Social History Narrative   Lives with sister occasionally   Right Handed   Drinks caffeine occassionally   Social Determinants of Health   Financial Resource Strain: Not on file  Food Insecurity: Not on file  Transportation Needs: Not on file  Physical Activity: Not on file  Stress: Not on file  Social Connections: Not on file    Family History  Problem  Relation Age of Onset   Pancreatic cancer Mother 29       Diagnosed in 2009; currently in hospice pt. htn   Hypertension Mother        + Sister x4   Cancer Father 39       brain tumor   Hypertension Sister    Hypertension Sister    Hypertension Sister    Stroke Sister    Hypertension Sister    Cancer Maternal Grandmother    Cancer Paternal Grandmother    Colon cancer Neg Hx     Outpatient Encounter Medications as of 11/21/2020  Medication Sig   alendronate (FOSAMAX) 70 MG tablet Take 1 tablet (70 mg total) by mouth every 7 (seven) days. Take with a full glass of water on an empty stomach.   amLODipine (NORVASC)  10 MG tablet Take 1 tablet by mouth once daily   amLODipine (NORVASC) 5 MG tablet Take 1 tablet (5 mg total) by mouth daily. TAKE ONE TABLET BY MOUTH DAILY EVERY EVENING   atorvastatin (LIPITOR) 10 MG tablet 1 tablet   Calcium Carbonate-Vitamin D (CALCIUM 500 + D PO) Take by mouth 2 (two) times daily.   cloNIDine (CATAPRES) 0.3 MG tablet Take one tablet by mouth at bedtime for blood pressure   gabapentin (NEURONTIN) 100 MG capsule Take 1 capsule (100 mg total) by mouth 3 (three) times daily.   levothyroxine (SYNTHROID) 75 MCG tablet Take 1 tablet (75 mcg total) by mouth daily before breakfast.   losartan (COZAAR) 100 MG tablet Take 1 tablet by mouth once daily   [DISCONTINUED] levothyroxine (SYNTHROID) 50 MCG tablet Take 1 tablet (50 mcg total) by mouth daily.   No facility-administered encounter medications on file as of 11/21/2020.    ALLERGIES: No Known Allergies VACCINATION STATUS: Immunization History  Administered Date(s) Administered   Influenza Split 12/29/2010, 11/10/2011   Influenza Whole 01/24/2009, 12/17/2009   Influenza,inj,Quad PF,6+ Mos 12/14/2012, 01/31/2014, 01/09/2015, 02/04/2016, 11/23/2016, 12/06/2017, 12/12/2018, 12/26/2019   Janssen (J&J) SARS-COV-2 Vaccination 11/28/2019   Moderna Sars-Covid-2 Vaccination 10/30/2019, 04/28/2020   PPD Test  12/14/2012, 12/19/2012   Td 12/17/2009   Tdap 05/08/2020   Zoster Recombinat (Shingrix) 02/03/2018, 06/27/2018     HPI   Faith Hood is a patient with the above medical history. she was diagnosed with hypothyroidism at approximate age of 69 years (newly diagnosed in March 2022), which required subsequent initiation of thyroid hormone supplementation. she was given various doses of Levothyroxine since, currently on 50 micrograms. she reports compliance to this medication:  Taking it daily on empty stomach  with water, separated by >30 minutes before breakfast and other medications , and by at least 4 hours from calcium, iron, PPIs, multivitamins.  I reviewed patient's thyroid tests:  Lab Results  Component Value Date   TSH 6.100 (H) 11/13/2020   TSH 5.550 (H) 08/27/2020   TSH 4.440 06/10/2020   TSH 42.500 (H) 05/01/2020   TSH 1.38 11/28/2019   TSH 7.20 (H) 06/23/2018   TSH 2.49 01/04/2018   TSH 2.76 11/20/2016   TSH 2.79 02/04/2016   TSH 1.60 09/16/2015   FREET4 0.83 11/13/2020   FREET4 0.93 08/27/2020   FREET4 1.04 06/10/2020   FREET4 0.9 06/23/2018   FREET4 0.67 (L) 03/31/2014   She also had thyroid ultrasound in 08/2020 which showed multiple small nodules bilaterally, none of which met criteria for dedicated follow up or biopsy.  Pt describes: - fatigue - dry skin - dizziness  Pt denies feeling nodules in neck, hoarseness, dysphagia/odynophagia, SOB with lying down.  she does family history of thyroid disorders in her sister (unsure of which but knows it was surgically removed).  She does report her sister was unable to tolerate the generic form of the hormone and has done well with branded Synthroid.  No family history of thyroid cancer.  No history of radiation therapy to head or neck.  No recent use of iodine supplements.  Denies use of Biotin containing supplements.  I reviewed her chart and she also has a history of HTN, HLD, transaminitis.   ROS:  Constitutional:  no weight gain/loss, + fatigue, no subjective hyperthermia, no subjective hypothermia Eyes: no blurry vision, no xerophthalmia ENT: no sore throat, no nodules palpated in throat, no dysphagia/odynophagia, no hoarseness Cardiovascular: no chest pain, no SOB, no palpitations, no leg  swelling Respiratory: no cough, no SOB Gastrointestinal: no nausea/vomiting/diarrhea Musculoskeletal: no muscle/joint aches Skin: no rashes, + dry skin Neurological: no tremors, no numbness, no tingling, + constant dizziness-has improved over last week Psychiatric: no depression, no anxiety   Objective:   Objective     BP 140/87   Pulse (!) 58   Ht _0  (1.6 m)   Wt 138 lb (62.6 kg)   BMI 24.45 kg/m  Wt Readings from Last 3 Encounters:  11/21/20 138 lb (62.6 kg)  11/13/20 136 lb 12.8 oz (62.1 kg)  10/14/20 137 lb 8 oz (62.4 kg)    BP Readings from Last 3 Encounters:  11/21/20 140/87  11/13/20 (!) 155/79  10/14/20 126/78      Physical Exam- Limited  Constitutional:  Body mass index is 24.45 kg/m. , not in acute distress, normal state of mind Eyes:  EOMI, no exophthalmos Neck: Supple Cardiovascular: RRR, no murmurs, rubs, or gallops, no edema Respiratory: Adequate breathing efforts, no crackles, rales, rhonchi, or wheezing Musculoskeletal: no gross deformities, strength intact in all four extremities, no gross restriction of joint movements Skin:  no rashes, no hyperemia Neurological: no tremor with outstretched hands   CMP ( most recent) CMP     Component Value Date/Time   NA 141 08/27/2020 1517   K 4.2 08/27/2020 1517   CL 103 08/27/2020 1517   CO2 23 08/27/2020 1517   GLUCOSE 84 08/27/2020 1517   GLUCOSE 97 11/28/2019 1449   BUN 14 08/27/2020 1517   CREATININE 0.80 08/27/2020 1517   CREATININE 0.79 11/28/2019 1449   CALCIUM 10.4 (H) 08/27/2020 1517   PROT 7.7 08/27/2020 1517   ALBUMIN 5.0 (H) 08/27/2020 1517   AST 31 08/27/2020 1517   ALT 27 08/27/2020 1517   ALKPHOS 89  08/27/2020 1517   BILITOT 0.4 08/27/2020 1517   GFRNONAA 80 11/28/2019 1449   GFRAA 93 11/28/2019 1449     Diabetic Labs (most recent): Lab Results  Component Value Date   HGBA1C 5.5 06/23/2018   HGBA1C 5.4 05/15/2016   HGBA1C 5.4 02/04/2016     Lipid Panel ( most recent) Lipid Panel     Component Value Date/Time   CHOL 172 08/27/2020 1517   TRIG 104 08/27/2020 1517   HDL 42 08/27/2020 1517   CHOLHDL 4.1 08/27/2020 1517   CHOLHDL 3.4 09/14/2019 1340   VLDL 16 05/15/2016 1531   LDLCALC 111 (H) 08/27/2020 1517   LDLCALC 143 (H) 09/14/2019 1340   LABVLDL 19 08/27/2020 1517       Lab Results  Component Value Date   TSH 6.100 (H) 11/13/2020   TSH 5.550 (H) 08/27/2020   TSH 4.440 06/10/2020   TSH 42.500 (H) 05/01/2020   TSH 1.38 11/28/2019   TSH 7.20 (H) 06/23/2018   TSH 2.49 01/04/2018   TSH 2.76 11/20/2016   TSH 2.79 02/04/2016   TSH 1.60 09/16/2015   FREET4 0.83 11/13/2020   FREET4 0.93 08/27/2020   FREET4 1.04 06/10/2020   FREET4 0.9 06/23/2018   FREET4 0.67 (L) 03/31/2014    Thyroid US from 09/06/20 CLINICAL DATA:  Hypothyroidism   EXAM: THYROID ULTRASOUND   TECHNIQUE: Ultrasound examination of the thyroid gland and adjacent soft tissues was performed.   COMPARISON:  07/19/2018 and previous back to 04/09/2014   FINDINGS: Parenchymal Echotexture: Moderately heterogenous   Isthmus: 1.4 cm thickness, previously 1.2   Right lobe: 6.1 x 1.2 x 2.3 cm, previously 4.7 x 2.4 x 2.7   Left lobe: 5.9 x  3 x 2.6 cm, previously 5 x 2.5 x 2.3   _________________________________________________________   Estimated total number of nodules >/= 1 cm: 3   Number of spongiform nodules >/=  2 cm not described below (TR1): 0   Number of mixed cystic and solid nodules >/= 1.5 cm not described below ([place]): 0   _________________________________________________________   Nodule # 1:   Prior biopsy: No   Location: Right; superior   Maximum size: 1.6 cm; Other  2 dimensions: 1.1 x 1.4 cm, previously, 1.4 x 1.2 x 1.4 cm   Composition: solid/almost completely solid (2)   Echogenicity: hyperechoic (1)   Shape: not taller-than-wide (0)   Margins: smooth (0)   Echogenic foci: none (0)   ACR TI-RADS total points: 3.   ACR TI-RADS risk category:  TR3 (3 points).   Significant change in size (>/= 20% in two dimensions and minimal increase of 2 mm): No   Change in features: No   Change in ACR TI-RADS risk category: No   ACR TI-RADS recommendations:   *Given size (>/= 1.5 - 2.4 cm) and appearance, a follow-up ultrasound in 1 year should be considered based on TI-RADS criteria.   _________________________________________________________   Nodule # 2: 0.6 cm calcification, mid left, stable; This nodule does NOT meet TI-RADS criteria for biopsy or dedicated follow-up.   _________________________________________________________   Nodule # 3:   Location: Left; superior   Maximum size: 1.2 cm; Other 2 dimensions: 0.8 x 0.9 cm   Composition: solid/almost completely solid (2)   Echogenicity: hyperechoic (1)   Shape: not taller-than-wide (0)   Margins: smooth (0)   Echogenic foci: none (0)   ACR TI-RADS total points: 3.   ACR TI-RADS risk category: TR3 (3 points).   ACR TI-RADS recommendations:   Given size (<1.4 cm) and appearance, this nodule does NOT meet TI-RADS criteria for biopsy or dedicated follow-up.   _________________________________________________________   Nodule # 4:   Location: Isthmus; right of midline   Maximum size: 1.2 cm; Other 2 dimensions: 1 x 0.8 cm   Composition: solid/almost completely solid (2)   Echogenicity: isoechoic (1)   Shape: not taller-than-wide (0)   Margins: ill-defined (0)   Echogenic foci: none (0)   ACR TI-RADS total points: 3.   ACR TI-RADS risk category: TR3 (3 points).   ACR TI-RADS recommendations:   Given size (<1.4 cm) and appearance, this nodule does NOT  meet TI-RADS criteria for biopsy or dedicated follow-up.   IMPRESSION: 1. Enlarged heterogenous thyroid with nodules as above. None meet criteria for biopsy. 2. Recommend annual/biennial ultrasound follow-up of right nodule as above, until stability x5 years confirmed.   The above is in keeping with the ACR TI-RADS recommendations - J Am Coll Radiol 2017;14:587-595.     Electronically Signed   By: Lucrezia Europe M.D.   On: 09/07/2020 08:13  Results for SADIA, BELFIORE (MRN [id number]) as of 11/21/2020 14:11  Ref. Range 11/13/2020 14:05  TSH Latest Ref Range: 0.450 - 4.500 uIU/mL 6.100 (H)  Triiodothyronine,Free,Serum Latest Ref Range: 2.0 - 4.4 pg/mL 2.9  T4,Free(Direct) Latest Ref Range: 0.82 - 1.77 ng/dL 0.83  Thyroperoxidase Ab SerPl-aCnc Latest Ref Range: 0 - 34 IU/mL >600 (H)  Thyroglobulin Antibody Latest Ref Range: 0.0 - 0.9 IU/mL 1,435.2 (H)     Assessment & Plan:   ASSESSMENT / PLAN:  1. Hypothyroidism- r/t Hashimoto's thyroiditis   Patient with relatively new hypothyroidism, on levothyroxine therapy. Her antibody testing confirms suspicion of autoimmune thyroid  dysfunction.    Her previsit thyroid function tests are consistent with under-replacement.  She will tolerate increase of her Levothyroxine to 75 mcg po daily before breakfast.    - We discussed about correct intake of levothyroxine, at fasting, with water, separated by at least 30 minutes from breakfast, and separated by more than 4 hours from calcium, iron, multivitamins, acid reflux medications (PPIs). -Patient is made aware of the fact that thyroid hormone replacement is needed for life, dose to be adjusted by periodic monitoring of thyroid function tests.  2. Multinodular thyroid Multiple thyroid nodules recommending follow up with surveillance ultrasound in 1 year  (around July 2023).   I spent 25 minutes in the care of the patient today including review of labs from Thyroid Function, CMP, and other relevant  labs ; imaging/biopsy records (current and previous including abstractions from other facilities); face-to-face time discussing  her lab results and symptoms, medications doses, her options of short and long term treatment based on the latest standards of care / guidelines;   and documenting the encounter.  Faith Hood  participated in the discussions, expressed understanding, and voiced agreement with the above plans.  All questions were answered to her satisfaction. she is encouraged to contact clinic should she have any questions or concerns prior to her return visit.   FOLLOW UP PLAN:  Return in about 3 months (around 02/20/2021) for Thyroid follow up, Previsit labs.  Faith Hood, [facility] [address] [place], [place] [address] Phone: [phone] Fax: [phone]  11/21/2020, 2:24 PM

## 2020-11-21 NOTE — Patient Instructions (Signed)

## 2020-12-04 ENCOUNTER — Other Ambulatory Visit: Payer: Self-pay

## 2020-12-04 ENCOUNTER — Other Ambulatory Visit (HOSPITAL_COMMUNITY)
Admission: RE | Admit: 2020-12-04 | Discharge: 2020-12-04 | Disposition: A | Discharge status: Home / Self Care | Payer: BC Managed Care – PPO | Source: Ambulatory Visit | Attending: Cardiology | Admitting: Cardiology

## 2020-12-04 ENCOUNTER — Encounter: Payer: Self-pay | Admitting: Cardiology

## 2020-12-04 ENCOUNTER — Ambulatory Visit: Payer: BC Managed Care – PPO | Admitting: Cardiology

## 2020-12-04 VITALS — BP 150/90 | HR 84 | Ht 64.0 in | Wt 140.0 lb

## 2020-12-04 DIAGNOSIS — E785 Hyperlipidemia, unspecified: Secondary | ICD-10-CM

## 2020-12-04 DIAGNOSIS — I10 Essential (primary) hypertension: Secondary | ICD-10-CM

## 2020-12-04 DIAGNOSIS — R0609 Other forms of dyspnea: Secondary | ICD-10-CM | POA: Diagnosis not present

## 2020-12-04 DIAGNOSIS — R42 Dizziness and giddiness: Secondary | ICD-10-CM | POA: Insufficient documentation

## 2020-12-04 LAB — CBC
HCT: 38.4 % (ref 36.0–46.0)
Hemoglobin: 12.8 g/dL (ref 12.0–15.0)
MCH: 29 pg (ref 26.0–34.0)
MCHC: 33.3 g/dL (ref 30.0–36.0)
MCV: 87.1 fL (ref 80.0–100.0)
Platelets: 221 10*3/uL (ref 150–400)
RBC: 4.41 MIL/uL (ref 3.87–5.11)
RDW: 14.2 % (ref 11.5–15.5)
WBC: 7.2 10*3/uL (ref 4.0–10.5)
nRBC: 0 % (ref 0.0–0.2)

## 2020-12-04 LAB — TSH: TSH: 2.929 u[IU]/mL (ref 0.350–4.500)

## 2020-12-04 LAB — COMPREHENSIVE METABOLIC PANEL
ALT: 19 U/L (ref 0–44)
AST: 24 U/L (ref 15–41)
Albumin: 4.1 g/dL (ref 3.5–5.0)
Alkaline Phosphatase: 80 U/L (ref 38–126)
Anion gap: 7 (ref 5–15)
BUN: 14 mg/dL (ref 8–23)
CO2: 26 mmol/L (ref 22–32)
Calcium: 9.3 mg/dL (ref 8.9–10.3)
Chloride: 108 mmol/L (ref 98–111)
Creatinine, Ser: 0.66 mg/dL (ref 0.44–1.00)
GFR, Estimated: >60 mL/min (ref >60)
Glucose, Bld: 89 mg/dL (ref 70–99)
Potassium: 3.8 mmol/L (ref 3.5–5.1)
Sodium: 141 mmol/L (ref 135–145)
Total Bilirubin: 0.6 mg/dL (ref 0.3–1.2)
Total Protein: 7.3 g/dL (ref 6.5–8.1)

## 2020-12-04 LAB — LIPID PANEL
Cholesterol: 164 mg/dL (ref 0–200)
HDL: 47 mg/dL (ref >40)
LDL Cholesterol: 104 mg/dL — ABNORMAL HIGH (ref 0–99)
Total CHOL/HDL Ratio: 3.5 RATIO
Triglycerides: 67 mg/dL (ref <150)
VLDL: 13 mg/dL (ref 0–40)

## 2020-12-04 MED ORDER — HYDROCHLOROTHIAZIDE 25 MG PO TABS: 12.5000 mg | ORAL_TABLET | Freq: Every day | ORAL | 3 refills | Status: DC

## 2020-12-04 NOTE — Patient Instructions (Signed)
Medication Instructions:  STOP Clonidine  STOP  Amlodipine 5 mg- continue to take 10 mg daily  START HCTZ 12.5 mg daily    *If you need a refill on your cardiac medications before your next appointment, please call your pharmacy*   Lab Work: Lipids, cmet,tsh, cbc,  If you have labs (blood work) drawn today and your tests are completely normal, you will receive your results only by: [place] (if you have MyChart) OR A paper copy in the mail If you have any lab test that is abnormal or we need to change your treatment, we will call you to review the results.   Testing/Procedures: Your physician has requested that you have an echocardiogram. Echocardiography is a painless test that uses sound waves to create images of your heart. It provides your doctor with information about the size and shape of your heart and how well your heart's chambers and valves are working. This procedure takes approximately one hour. There are no restrictions for this procedure.    Follow-Up: At [facility], you and your health needs are our priority.  As part of our continuing mission to provide you with exceptional heart care, we have created designated Provider Care Teams.  These Care Teams include your primary Cardiologist (physician) and Advanced Practice Providers (APPs -  Physician Assistants and Nurse Practitioners) who all work together to provide you with the care you need, when you need it.  We recommend signing up for the patient portal called "MyChart".  Sign up information is provided on this After Visit Summary.  MyChart is used to connect with patients for Virtual Visits (Telemedicine).  Patients are able to view lab/test results, encounter notes, upcoming appointments, etc.  Non-urgent messages can be sent to your provider as well.   To learn more about what you can do with MyChart, go to [email or web address].    Your next appointment:   6 month(s)  The format for your next  appointment:   In Person  Provider:   Dr.Schumann   Other Instructions Check bp daily for 2 weeks and then call office with readings-[phone]

## 2020-12-04 NOTE — Progress Notes (Signed)
Cardiology Office Note:    Date:  12/04/2020   ID:  Faith Hood, DOB August 15, 1955, MRN [id number]  PCP:  Fayrene Helper, MD  Cardiologist:  None  Electrophysiologist:  None   Referring MD: Fayrene Helper, MD   Chief Complaint  Patient presents with   Dizziness    History of Present Illness:    Faith Hood is a 65 y.o. female with a hx of hypertension, hyperlipidemia who is referred by Dr. Moshe Cipro for evaluation of lightheadedness.  She reports that she has been having lightheadedness for months.  States that when she wakes up it is not as bad but then it continues throughout the day.  States that she feels lightheaded all the time.  No change with position.  She has not had any syncopal episodes.  She denies any chest pain, dyspnea, edema, or palpitations.  She has not been exercising.  No smoking history.  Family history includes sister had CVA at age 52   Past Medical History:  Diagnosis Date   Cerebral vascular disease    Carotid ultrasound in 10/2011: Mild plaque; tortuous vessels; no definite luminal obstruction.   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  Repeat in 2030.   DIAGNOSTIC LAPAROSCOPY  1978   Gynecologic problems    Current Medications: Current Meds  Medication Sig   alendronate (FOSAMAX) 70 MG tablet Take 1 tablet (70 mg total) by mouth every 7 (seven) days. Take with a full glass of water on an empty stomach.   amLODipine (NORVASC) 10 MG tablet Take 1 tablet by mouth once daily   hydrochlorothiazide (HYDRODIURIL) 25 MG tablet Take 0.5 tablets (12.5 mg total) by mouth daily.   levothyroxine (SYNTHROID) 75 MCG tablet Take 1 tablet (75  mcg total) by mouth daily before breakfast.   losartan (COZAAR) 100 MG tablet Take 1 tablet by mouth once daily   [DISCONTINUED] amLODipine (NORVASC) 5 MG tablet Take 1 tablet (5 mg total) by mouth daily. TAKE ONE TABLET BY MOUTH DAILY EVERY EVENING   [DISCONTINUED] cloNIDine (CATAPRES) 0.3 MG tablet Take one tablet by mouth at bedtime for blood pressure     Allergies:   Patient has no known allergies.   Social History   Socioeconomic History   Marital status: Single    Spouse name: Not on file   Number of children: 0   Years of education: Not on file   Highest education level: Not on file  Occupational History   Occupation: Middle school teacher retired    Comment: X25 years  Tobacco Use   Smoking status: Never   Smokeless tobacco: Never  Vaping Use   Vaping Use: Never used  Substance and Sexual Activity   Alcohol use: Not Currently    Comment: occasionally. About twice a year.    Drug use: No   Sexual activity: Not Currently  Other Topics Concern   Not on file  Social History Narrative   Lives with sister occasionally   Right Handed   Drinks caffeine occassionally   Social Determinants of Health   Financial Resource Strain: Not on file  Food Insecurity: Not on file  Transportation Needs: Not on file  Physical Activity: Not on file  Stress: Not on file  Social Connections: Not on file     Family History: The patient's family history includes Cancer in her maternal grandmother and paternal grandmother; Cancer (age of onset: 29) in her father; Hypertension in her mother, sister, sister, sister, and sister; Pancreatic cancer (age of onset: 69) in her mother; Stroke in her sister. There is no history of Colon cancer.  ROS:   Please see the history of present illness.     All other systems reviewed and are negative.  EKGs/Labs/Other Studies Reviewed:    The following studies were reviewed today:   EKG:  EKG is ordered today.  The ekg ordered today demonstrates  normal sinus rhythm, sinus arrhythmia, no ST abnormalities  Recent Labs: 08/27/2020: ALT 27; BUN 14; Creatinine, Ser 0.80; Hemoglobin 12.7; Magnesium 2.1; Platelets 259; Potassium 4.2; Sodium 141 11/13/2020: TSH 6.100  Recent Lipid Panel    Component Value Date/Time   CHOL 172 08/27/2020 1517   TRIG 104 08/27/2020 1517   HDL 42 08/27/2020 1517   CHOLHDL 4.1 08/27/2020 1517   CHOLHDL 3.4 09/14/2019 1340   VLDL 16 05/15/2016 1531   LDLCALC 111 (H) 08/27/2020 1517   LDLCALC 143 (H) 09/14/2019 1340    Physical Exam:    VS:  BP (!) 150/90 (BP Location: Left Arm)   Pulse 84   Ht 5\' 4"  (1.626 m)   Wt 140 lb (63.5 kg)   SpO2 97%   BMI 24.03 kg/m     Wt Readings from Last 3 Encounters:  12/04/20 140 lb (63.5 kg)  11/21/20 138 lb (62.6 kg)  11/13/20 136 lb 12.8 oz (62.1 kg)     GEN:  Well nourished, well developed in no acute distress HEENT: Normal NECK: No JVD; No carotid bruits LYMPHATICS: No lymphadenopathy CARDIAC: RRR, no murmurs, rubs, gallops RESPIRATORY:  Clear to auscultation without rales, wheezing or rhonchi  ABDOMEN: Soft, non-tender, non-distended MUSCULOSKELETAL:  No edema; No deformity  SKIN: Warm and dry NEUROLOGIC:  Alert and oriented x 3 PSYCHIATRIC:  Normal affect   ASSESSMENT:    1. Dizziness   2. DOE (dyspnea on exertion)   3. Essential hypertension   4. Hyperlipidemia, unspecified hyperlipidemia type    PLAN:    Lightheadedness: Unclear cause.  No relationship to position, do not suspect orthostasis.  Carotid duplex on 09/23/2020 showed less than 50% stenosis of the internal carotid arteries.  No palpitations or syncope.  Check echocardiogram for all structural heart disease.  She is taking an unusually high dose of amlodipine (50 mg daily), will reduce to 10 mg daily.  Will check CMP, TSH, CBC  Hypertension: On losartan 100 mg daily, clonidine 0.3 mg nightly, amlodipine 10 mg daily.  Reports has not been taking clonidine, will discontinue.  Will  decrease amlodipine to 10 mg daily.  Add HCTZ 12.5 mg daily.  Will check CMP.  Asked her to check BP daily for next 2 weeks and let us know results.  Hyperlipidemia: On atorvastatin 10 mg daily.  Check lipid panel  Carotid stenosis: Less than 50% stenosis of bilateral internal carotid arteries.  Check lipid panel as above, goal LDL less than 70.  RTC in 6 months  Medication Adjustments/Labs and Tests Ordered: Current medicines are reviewed at length with the patient today.  Concerns regarding medicines are outlined above.  Orders Placed This Encounter  Procedures   CBC  Comprehensive metabolic panel   TSH   Lipid Profile   EKG 12-Lead   ECHOCARDIOGRAM COMPLETE   Meds ordered this encounter  Medications   hydrochlorothiazide (HYDRODIURIL) 25 MG tablet    Sig: Take 0.5 tablets (12.5 mg total) by mouth daily.    Dispense:  45 tablet    Refill:  3    Patient Instructions  Medication Instructions:  STOP Clonidine  STOP  Amlodipine 5 mg- continue to take 10 mg daily  START HCTZ 12.5 mg daily    *If you need a refill on your cardiac medications before your next appointment, please call your pharmacy*   Lab Work: Lipids, cmet,tsh, cbc,  If you have labs (blood work) drawn today and your tests are completely normal, you will receive your results only by: [place] (if you have MyChart) OR A paper copy in the mail If you have any lab test that is abnormal or we need to change your treatment, we will call you to review the results.   Testing/Procedures: Your physician has requested that you have an echocardiogram. Echocardiography is a painless test that uses sound waves to create images of your heart. It provides your doctor with information about the size and shape of your heart and how well your heart's chambers and valves are working. This procedure takes approximately one hour. There are no restrictions for this procedure.    Follow-Up: At [facility], you  and your health needs are our priority.  As part of our continuing mission to provide you with exceptional heart care, we have created designated Provider Care Teams.  These Care Teams include your primary Cardiologist (physician) and Advanced Practice Providers (APPs -  Physician Assistants and Nurse Practitioners) who all work together to provide you with the care you need, when you need it.  We recommend signing up for the patient portal called "MyChart".  Sign up information is provided on this After Visit Summary.  MyChart is used to connect with patients for Virtual Visits (Telemedicine).  Patients are able to view lab/test results, encounter notes, upcoming appointments, etc.  Non-urgent messages can be sent to your provider as well.   To learn more about what you can do with MyChart, go to [email or web address].    Your next appointment:   6 month(s)  The format for your next appointment:   In Person  Provider:   Dr.Janeann Paisley   Other Instructions Check bp daily for 2 weeks and then call office with readings-[phone]   Signed, Donato Heinz, MD  12/04/2020 2:18 PM    [place]

## 2020-12-05 ENCOUNTER — Other Ambulatory Visit: Payer: Self-pay | Admitting: *Deleted

## 2020-12-05 DIAGNOSIS — I10 Essential (primary) hypertension: Secondary | ICD-10-CM

## 2020-12-05 MED ORDER — ATORVASTATIN CALCIUM 20 MG PO TABS: ORAL_TABLET | ORAL | 3 refills | Status: DC

## 2020-12-16 ENCOUNTER — Telehealth: Payer: Self-pay | Admitting: Cardiology

## 2020-12-16 NOTE — Telephone Encounter (Signed)
Patient was returning a call to discuss test results  

## 2020-12-16 NOTE — Telephone Encounter (Signed)
Donato Heinz, MD  12/05/2020  6:50 AM EDT     Cholesterol elevated, recommend increasing atorvastatin to 20 mg daily.  Would repeat BMET in 1-2 weeks since starting HCTZ       I discussed lab results with patient. She has already picked up her atorvastatin and will go to have bmet done at [facility] out patient lab

## 2020-12-19 ENCOUNTER — Other Ambulatory Visit: Payer: Self-pay

## 2020-12-19 ENCOUNTER — Encounter: Payer: Self-pay | Admitting: Orthopedic Surgery

## 2020-12-19 ENCOUNTER — Ambulatory Visit: Payer: BC Managed Care – PPO | Admitting: Orthopedic Surgery

## 2020-12-19 VITALS — BP 146/80 | HR 66 | Ht 64.0 in | Wt 141.0 lb

## 2020-12-19 DIAGNOSIS — M7501 Adhesive capsulitis of right shoulder: Secondary | ICD-10-CM

## 2020-12-19 NOTE — Progress Notes (Signed)
FOLLOW UP   Encounter Diagnosis  Name Primary?   Adhesive capsulitis of right shoulder Yes     Chief Complaint  Patient presents with   Shoulder Pain    Rt shoulder feeling much better      Faith Hood had adhesive capsulitis of the right shoulder and has recovered nicely with therapy  She can now fully externally rotate her arm when it is at her side and she can elevate to 140 degrees  No other treatment necessary  Recommend follow-up on an as-needed basis

## 2021-01-09 ENCOUNTER — Other Ambulatory Visit: Payer: Self-pay

## 2021-01-09 ENCOUNTER — Encounter: Payer: Self-pay | Admitting: Family Medicine

## 2021-01-09 ENCOUNTER — Ambulatory Visit: Payer: BC Managed Care – PPO | Admitting: Family Medicine

## 2021-01-09 DIAGNOSIS — R200 Anesthesia of skin: Secondary | ICD-10-CM | POA: Diagnosis not present

## 2021-01-09 DIAGNOSIS — E038 Other specified hypothyroidism: Secondary | ICD-10-CM

## 2021-01-09 DIAGNOSIS — E063 Autoimmune thyroiditis: Secondary | ICD-10-CM

## 2021-01-09 DIAGNOSIS — I10 Essential (primary) hypertension: Secondary | ICD-10-CM

## 2021-01-09 MED ORDER — HYDROCHLOROTHIAZIDE 25 MG PO TABS: 25.0000 mg | ORAL_TABLET | Freq: Every day | ORAL | 2 refills | Status: DC

## 2021-01-09 MED ORDER — POTASSIUM CHLORIDE CRYS ER 10 MEQ PO TBCR: 10.0000 meq | EXTENDED_RELEASE_TABLET | Freq: Every day | ORAL | 2 refills | Status: DC

## 2021-01-09 NOTE — Progress Notes (Signed)
   COURTLAND COPPA     MRN: [id number]      DOB: [birth year]   HPI [name] is here for follow up and re-evaluation of chronic medical conditions, medication management and review of any available recent lab and radiology data.  Preventive health is updated, specifically  Cancer screening and Immunization.   Questions or concerns regarding consultations or procedures which the PT has had in the interim are  addressed.Has seen Neurology but not tolerating gabapentin and has significant neuropathy The PT denies any adverse reactions to current medications since the last visit.  .  ROS Denies recent fever or chills. Denies sinus pressure, nasal congestion, ear pain or sore throat. Denies chest congestion, productive cough or wheezing. Denies chest pains, palpitations and leg swelling Denies abdominal pain, nausea, vomiting,diarrhea or constipation.   Denies dysuria, frequency, hesitancy or incontinence. n in mobility. Denies depression, anxiety or insomnia. Denies skin break down or rash.   PE  BP (!) 156/81   Resp 16   Ht 5\' 4"  (1.626 m)   Wt 141 lb 1.9 oz (64 kg)   SpO2 95%   BMI 24.22 kg/m   Patient alert and oriented and in no cardiopulmonary distress.  HEENT: No facial asymmetry, EOMI,     Neck supple .  Chest: Clear to auscultation bilaterally.  CVS: S1, S2 no murmurs, no S3.Regular rate.  ABD: Soft non tender.   Ext: No edema  MS: Adequate ROM spine, shoulders, hips and knees.  Skin: Intact, no ulcerations or rash noted.  Psych: Good eye contact, normal affect. Memory intact not anxious or depressed appearing.  CNS: CN 2-12 intact, power,  normal throughout.no focal deficits noted.   Assessment & Plan  Hypertension Uncontrolled, inc hCTZ to 25 mg daily and add potassium daily DASH diet and commitment to daily physical activity for a minimum of 30 minutes discussed and encouraged, as a part of hypertension management. The importance of attaining a healthy weight  is also discussed.  BP/Weight 01/09/2021 12/19/2020 12/04/2020 11/21/2020 11/13/2020 10/14/2020 6/73/4193  Systolic BP 790 [phone]  Diastolic BP 81 80 90 87 79 78 83  Wt. (Lbs) 141.12 141 140 138 136.8 137.5 134.04  BMI 24.22 24.2 24.03 24.45 24.23 24.36 23.74       Hyperlipidemia Hyperlipidemia:Low fat diet discussed and encouraged.   Lipid Panel  Lab Results  Component Value Date   CHOL 164 12/04/2020   HDL 47 12/04/2020   LDLCALC 104 (H) 12/04/2020   TRIG 67 12/04/2020   CHOLHDL 3.5 12/04/2020  not at goal, needs to reduce fat in diet     Hypothyroidism Controlled, no change in medication   Numbness of feet Uncontrolled neuropathic pain needs to contact Neuroloigy,unable to tolerate gabapentin

## 2021-01-09 NOTE — Assessment & Plan Note (Addendum)
Uncontrolled, inc hCTZ to 25 mg daily and add potassium daily DASH diet and commitment to daily physical activity for a minimum of 30 minutes discussed and encouraged, as a part of hypertension management. The importance of attaining a healthy weight is also discussed.  BP/Weight 01/09/2021 12/19/2020 12/04/2020 11/21/2020 11/13/2020 10/14/2020 4/92/0100  Systolic BP 712 197 588 325 498 264 158  Diastolic BP 81 80 90 87 79 78 83  Wt. (Lbs) 141.12 141 140 138 136.8 137.5 134.04  BMI 24.22 24.2 24.03 24.45 24.23 24.36 23.74

## 2021-01-09 NOTE — Patient Instructions (Signed)
F/U 2nd week in January, call if you need me sooner  Flu vaccine today  Increase HCTZ to 25 mg one daily and start potassium 10 meq one daily , this is prescribed  Blood pressure is still too high  Contact Neurology about ongoing neuropathic pain since you could not tolerate the gabapentin  Please reduce salt and processed food and increase fresh/ frozen fruit and vegetables  Thanks for choosing [facility], we consider it a privelige to serve you.

## 2021-01-12 ENCOUNTER — Encounter: Payer: Self-pay | Admitting: Family Medicine

## 2021-01-12 NOTE — Assessment & Plan Note (Signed)
Hyperlipidemia:Low fat diet discussed and encouraged.   Lipid Panel  Lab Results  Component Value Date   CHOL 164 12/04/2020   HDL 47 12/04/2020   LDLCALC 104 (H) 12/04/2020   TRIG 67 12/04/2020   CHOLHDL 3.5 12/04/2020  not at goal, needs to reduce fat in diet

## 2021-01-12 NOTE — Assessment & Plan Note (Signed)
Controlled, no change in medication  

## 2021-01-12 NOTE — Assessment & Plan Note (Signed)
Uncontrolled neuropathic pain needs to contact Neuroloigy,unable to tolerate gabapentin

## 2021-01-15 ENCOUNTER — Ambulatory Visit (HOSPITAL_COMMUNITY)
Admission: RE | Admit: 2021-01-15 | Discharge: 2021-01-15 | Disposition: A | Discharge status: Home / Self Care | Payer: BC Managed Care – PPO | Source: Ambulatory Visit | Attending: Cardiology | Admitting: Cardiology

## 2021-01-15 ENCOUNTER — Other Ambulatory Visit: Payer: Self-pay

## 2021-01-15 DIAGNOSIS — R42 Dizziness and giddiness: Secondary | ICD-10-CM | POA: Diagnosis not present

## 2021-01-15 DIAGNOSIS — I10 Essential (primary) hypertension: Secondary | ICD-10-CM

## 2021-01-15 LAB — ECHOCARDIOGRAM COMPLETE
AR max vel: 2.11 cm2
AV Area VTI: 2.13 cm2
AV Area mean vel: 1.98 cm2
AV Mean grad: 4 mmHg
AV Peak grad: 8.8 mmHg
Ao pk vel: 1.48 m/s
Area-P 1/2: 4.15 cm2
Calc EF: 60.1 %
MV VTI: 2.05 cm2
S' Lateral: 2.6 cm
Single Plane A2C EF: 64.6 %
Single Plane A4C EF: 56.3 %

## 2021-01-15 NOTE — Progress Notes (Signed)
*  PRELIMINARY RESULTS* Echocardiogram 2D Echocardiogram has been performed.  Faith Hood 01/15/2021, 1:57 PM

## 2021-01-24 ENCOUNTER — Encounter: Payer: Self-pay | Admitting: *Deleted

## 2021-02-24 ENCOUNTER — Encounter: Payer: Self-pay | Admitting: Gastroenterology

## 2021-02-24 NOTE — Progress Notes (Signed)
Referring Provider: Fayrene Helper, MD Primary Care Physician:  Fayrene Helper, MD Primary GI Physician: Dr. Gala Romney  Chief Complaint  Patient presents with   elevated LFT    HPI:   Faith Hood is a 66 y.o. female presenting today for follow-up of elevated LFTs.  She has history of elevated LFTs since 2018.  Evaluation thus far has included negative acute hepatitis panel, ANA negative, AMA negative, ASMA elevated at 24, IgG elevated at 1591, ceruloplasmin within normal limits, no evidence of alpha 1 antitrypsin deficiency.  Iron panel with ferritin 430H, but sats normal at 18.  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 Celiac screen negative. She was diagnosed with polyarteritis nodosa in May 2021 by [facility] rheumatology and started on steroid taper with subsequent improvement in LFTs. MRI liver protocol January 2021 with normal appearing liver. LFTs felt multifactorial in setting of polyarteritis nodosa but unable to exclude coexisting autoimmune hepatitis.   Last seen in our office 01/22/2020.  She was on prednisone 5 mg daily at that time with plans for her last dose of prednisone to be around December 10.  The rash on her arms had resolved and rash on the legs improving.  She had mild swelling in her lower extremities since starting amlodipine.  No abdominal distention.  No other signs or symptoms of decompensated liver disease.  Noted mild constipation without alarm symptoms.  No other significant GI symptoms. Planned to update HFP, start daily fiber supplement, and use MiraLAX if needed.   HFP completed 11/29 with LFTs returned to normal.  Recommended continuing to monitor with low threshold for liver biopsy if LFTs increase again.  LFTs increased in March with AST 52, ALT 35.  Notably, TSH was significantly elevated at 42.5, found to be related to Hashimotos thyroidoitis, started on levothyroxine.  In April, AST 45,  ALT 40, TSH improved to 4.44.  LFTs returned to normal in July 2022, remained normal in October, and most recent labs completed 01/30/21 reviewed in Care everywhere under [place] with LFTs again wnl.   Today:  Discontinued steroids in early to mid 2022. Overall doing well in regards to polyarteritis nodosa. Rash has resolved. Had F/U with Rheumatology in December. Planning to follow-up in 1 year.   Denies abdominal distension, peripheral edema, yellowing of the eyes, bruising/bleeding, or changes in mental status.   Denies abdominal pain, constipation, diarrhea, brbpr, melena, reflux, dysphagia, nausea, or vomiting.   Not sure if she started Hep A/B vaccine series with PCP.   No alcohol or OTC supplements.  No regular tylenol use.   Past Medical History:  Diagnosis Date   Cerebral vascular disease    Carotid ultrasound in 10/2011: Mild plaque; tortuous vessels; no definite luminal obstruction.   Elevated LFTs    with positive ASMA and mildly elevated IgG, no biopsy pursued as LFTs normalized.   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  Repeat in 2030.   DIAGNOSTIC LAPAROSCOPY  1978   Gynecologic problems    Current Outpatient Medications  Medication Sig Dispense Refill   alendronate (FOSAMAX) 70 MG tablet Take 1 tablet (70 mg total) by mouth every 7 (seven) days. Take with a full glass of water on an empty stomach. 4 tablet 11   atorvastatin (LIPITOR) 20 MG tablet Take 1 tablet (20 mg) by mouth once daily 90 tablet 3   Calcium Carbonate-Vitamin D (CALCIUM 500 + D PO) Take by mouth 2 (two) times daily.      hydrochlorothiazide (HYDRODIURIL) 25 MG tablet Take 1 tablet (25 mg total) by mouth daily. 30 tablet 2   levothyroxine (SYNTHROID) 75 MCG tablet Take 1 tablet (75 mcg total) by mouth daily before breakfast. 90 tablet 1   losartan (COZAAR) 100 MG tablet Take 1 tablet by mouth once daily 90 tablet 0   potassium chloride (KLOR-CON) 10 MEQ tablet Take 1 tablet (10 mEq total) by mouth daily. 30 tablet 2   No current facility-administered medications for this visit.    Allergies as of 02/26/2021   (No Known Allergies)    Family History  Problem Relation Age of Onset   Pancreatic cancer Mother 42       Diagnosed in 2009; currently in hospice pt. htn   Hypertension Mother        + Sister x4   Cancer Father 24       brain tumor   Hypertension Sister    Hypertension Sister    Hypertension Sister    Stroke Sister    Hypertension Sister    Cancer Maternal Grandmother    Cancer Paternal Grandmother    Colon cancer Neg Hx     Social History   Socioeconomic History   Marital status: Single    Spouse name: Not on file   Number of children: 0   Years of education: Not on file   Highest education level: Not on file  Occupational History   Occupation: Middle school teacher retired    Comment: X25 years  Tobacco Use   Smoking status: Never   Smokeless tobacco: Never  Vaping Use   Vaping Use: Never used  Substance and Sexual Activity   Alcohol use: Not Currently    Comment: occasionally. About twice a year.    Drug use: No   Sexual activity: Not Currently  Other Topics Concern   Not on file  Social History Narrative   Lives with sister occasionally   Right Handed   Drinks caffeine occassionally   Social Determinants of Health   Financial Resource Strain: Not on file  Food Insecurity: Not on file  Transportation Needs: Not on file  Physical Activity: Not on file  Stress: Not on file  Social Connections: Not on file    Review of Systems: Gen: Denies fever, chills, cold  or flulike symptoms, presyncope, syncope. CV: Denies chest pain, palpitations. Resp: Denies dyspnea or cough. GI: See HPI Derm: Denies rash Heme: See HPI  Physical Exam: BP (!) 141/84    Pulse (!) 101    Temp (!) 97.1 F (36.2 C)    Ht 5\' 4"  (1.626 m)    Wt 143 lb 12.8 oz (65.2 kg)    BMI 24.68 kg/m  General:   Alert and oriented. No distress noted. Pleasant and cooperative.  Head:  Normocephalic and atraumatic. Eyes:  Conjuctiva clear without scleral icterus. Heart:  S1, S2 present without murmurs appreciated. Lungs:  Clear to auscultation bilaterally. No wheezes, rales, or rhonchi. No distress.  Abdomen:  +BS, soft, non-tender and non-distended. No rebound or  guarding. No HSM or masses noted. Msk:  Symmetrical without gross deformities. Normal posture. Extremities:  Without edema. Neurologic:  Alert and  oriented x4 Psych:  Normal mood and affect.    Assessment: 66 y.o. female presenting today for follow-up of elevated LFTs.   She has history of mildly elevated LFTs since 2018.  Extensive evaluation including Hep A, B, and C negative without immunity to Hep A or B, ANA negative, AMA negative, ASMA elevated at 24, IgG slightly elevated at 1591, ceruloplasmin within normal limits, no evidence of alpha 1 antitrypsin deficiency.  Iron panel with ferritin 430H, but sats normal at 18.  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 Celiac screen negative. MRI liver protocol January 2021 with normal appearing liver. She was diagnosed with polyarteritis nodosa in May 2021 by [facility]umatology and started on prolonged steroid taper (completed in early to mid 2022 per patient) with subsequent improvement in LFTs. LFTs normalized in November 2021, increased in March 2022 with AST 53, ALT 35 in the setting of hypothyroidism with TSH 42.5, thought to be secondary to Hashimoto's thyroiditis. TSH improved after starting levothyroxine and LFTs  normalized in July 2022, remained normal in October. I was initially able to view labs completed on 01/30/21 by [facility] Rheumatology in [place] revealing normal LFTs; however, I can not locate these labs again. She has no signs or symptoms of advanced liver disease. Denies alcohol, OTC supplements, or routine tylenol use.   Suspect prior LFT elevation likely multifactorial in the setting of polyarteritis nodosa (though liver involvement is less common), hypothyroidism/Hashimoto's thyroiditis, and can't rule out underlying component of autoimmune hepatitis with mildly elevated ASMA and IgG, but as LFTs have returned to normal, we will continue to monitor for now.  If LFTs increase again, will have low threshold for liver biopsy.    Plan:  Request recent labs from [facility] Rheumatology.  Repeat HFP in 3 months. Nursing staff will arrange.  Healthy diet/lifestyle:  Low fat/cholesterol diet.   Avoid sweets, sodas, fruit juices, sweetened beverages like tea, etc. Gradually increase exercise from 15 min daily up to 1 hr per day 5 days/week. Continue to limit alcohol.  Reach out to patient's PCP to determine if she has started Hep A/B vaccine series as she can't remember.  Follow-up in 6 months.    Aliene Altes, PA-C [facility] 02/26/2021

## 2021-02-26 ENCOUNTER — Ambulatory Visit: Payer: BC Managed Care – PPO | Admitting: Gastroenterology

## 2021-02-26 ENCOUNTER — Encounter: Payer: Self-pay | Admitting: Gastroenterology

## 2021-02-26 ENCOUNTER — Other Ambulatory Visit: Payer: Self-pay

## 2021-02-26 VITALS — BP 141/84 | HR 101 | Temp 97.1°F | Ht 64.0 in | Wt 143.8 lb

## 2021-02-26 DIAGNOSIS — R7989 Other specified abnormal findings of blood chemistry: Secondary | ICD-10-CM

## 2021-02-26 NOTE — Patient Instructions (Addendum)
We will plan to repeat your liver enzymes in 3 months.   Recommend a healthy diet/lifestyle:  Low fat/cholesterol diet.   Avoid sweets, sodas, fruit juices, sweetened beverages like tea, etc. Gradually increase exercise from 15 min daily up to 1 hr per day 5 days/week. Continue to limit alcohol.   We will follow-up with you in 6 months.   Aliene Altes, PA-C [facility] Gastroenter[facility]

## 2021-02-27 ENCOUNTER — Ambulatory Visit: Payer: BC Managed Care – PPO | Admitting: Nurse Practitioner

## 2021-02-27 ENCOUNTER — Other Ambulatory Visit: Payer: Self-pay

## 2021-02-27 ENCOUNTER — Encounter: Payer: Self-pay | Admitting: Gastroenterology

## 2021-02-27 DIAGNOSIS — R748 Abnormal levels of other serum enzymes: Secondary | ICD-10-CM

## 2021-02-28 ENCOUNTER — Other Ambulatory Visit (HOSPITAL_COMMUNITY): Payer: Self-pay | Admitting: Family Medicine

## 2021-02-28 DIAGNOSIS — Z1231 Encounter for screening mammogram for malignant neoplasm of breast: Secondary | ICD-10-CM

## 2021-03-03 ENCOUNTER — Encounter: Payer: Self-pay | Admitting: Internal Medicine

## 2021-03-03 ENCOUNTER — Telehealth: Payer: Self-pay | Admitting: Gastroenterology

## 2021-03-03 NOTE — Telephone Encounter (Signed)
[name]: Can you request recent labs from [facility].   Courtney: Can you reach out to patient's PCP to see if she ever started Hep A/B vaccine series with them. Patient was unable to recall at the time of her OV.

## 2021-03-04 NOTE — Telephone Encounter (Signed)
Spoke to [place] at PCP office, she stated I needed to look in her chart to see if she had it done. I didn't see any Hep A/B vaccine series.

## 2021-03-04 NOTE — Telephone Encounter (Signed)
Spoke to pt, informed her when she has her next OV to talk to her PCP about the Hep A/B vaccines series. Pt voiced understanding.

## 2021-03-04 NOTE — Telephone Encounter (Signed)
Noted. Reviewed her chart. From what I can tell, she has not started the vaccine series. Please let her know she should discuss starting Hep A/B vaccine series with her PCP. If they are unable to provide this, we can write a prescription for the vaccines and she can have them completed at a pharmacy.

## 2021-03-06 ENCOUNTER — Encounter: Payer: Self-pay | Admitting: Family Medicine

## 2021-03-06 ENCOUNTER — Other Ambulatory Visit: Payer: Self-pay | Admitting: Family Medicine

## 2021-03-06 ENCOUNTER — Other Ambulatory Visit: Payer: Self-pay

## 2021-03-06 ENCOUNTER — Telehealth: Payer: Self-pay | Admitting: Internal Medicine

## 2021-03-06 ENCOUNTER — Ambulatory Visit: Payer: BC Managed Care – PPO | Admitting: Family Medicine

## 2021-03-06 VITALS — BP 170/100 | HR 97 | Resp 16 | Ht 64.0 in | Wt 144.8 lb

## 2021-03-06 DIAGNOSIS — E7849 Other hyperlipidemia: Secondary | ICD-10-CM | POA: Diagnosis not present

## 2021-03-06 DIAGNOSIS — I10 Essential (primary) hypertension: Secondary | ICD-10-CM | POA: Diagnosis not present

## 2021-03-06 MED ORDER — LOSARTAN POTASSIUM-HCTZ 100-25 MG PO TABS: 1.0000 | ORAL_TABLET | Freq: Every day | ORAL | 1 refills | Status: DC

## 2021-03-06 NOTE — Telephone Encounter (Signed)
Noted  

## 2021-03-06 NOTE — Telephone Encounter (Signed)
[facility] said they need a signed release from patient before they release her labs to [place]. I have mailed release to patient and waiting for her to mail it back.

## 2021-03-06 NOTE — Patient Instructions (Signed)
F/u in 6 week, call if you need me sooner  New is Hyzaar 100/25 ONE daily for your blood pressure  STOP cozaar 100 mg tablet and stop the HCTZ 25 mg tablet , they are combined in the hyzaar tablet, same medications, same doses  Thanks for choosing [facility], we consider it a privelige to serve you.

## 2021-03-10 ENCOUNTER — Ambulatory Visit (HOSPITAL_COMMUNITY)
Admission: RE | Admit: 2021-03-10 | Discharge: 2021-03-10 | Disposition: A | Discharge status: Home / Self Care | Payer: BC Managed Care – PPO | Source: Ambulatory Visit | Attending: Family Medicine | Admitting: Family Medicine

## 2021-03-10 ENCOUNTER — Other Ambulatory Visit: Payer: Self-pay

## 2021-03-10 DIAGNOSIS — Z1231 Encounter for screening mammogram for malignant neoplasm of breast: Secondary | ICD-10-CM | POA: Insufficient documentation

## 2021-03-14 ENCOUNTER — Encounter: Payer: Self-pay | Admitting: Family Medicine

## 2021-03-14 LAB — TSH: TSH: 5.26 u[IU]/mL — ABNORMAL HIGH (ref 0.450–4.500)

## 2021-03-14 LAB — T4, FREE: Free T4: 0.86 ng/dL (ref 0.82–1.77)

## 2021-03-14 NOTE — Assessment & Plan Note (Signed)
Hyperlipidemia:Low fat diet discussed and encouraged.   Lipid Panel  Lab Results  Component Value Date   CHOL 164 12/04/2020   HDL 47 12/04/2020   LDLCALC 104 (H) 12/04/2020   TRIG 67 12/04/2020   CHOLHDL 3.5 12/04/2020

## 2021-03-14 NOTE — Assessment & Plan Note (Signed)
Uncontrolled, start hyzaar 100/25 daily, re eval in 6 weeks DASH diet and commitment to daily physical activity for a minimum of 30 minutes discussed and encouraged, as a part of hypertension management. The importance of attaining a healthy weight is also discussed.  BP/Weight 03/06/2021 02/26/2021 01/09/2021 12/19/2020 12/04/2020 11/21/2020 7/49/3552  Systolic BP 174 715 953 967 289 791 504  Diastolic BP 136 84 81 80 90 87 79  Wt. (Lbs) 144.8 143.8 141.12 141 140 138 136.8  BMI 24.85 24.68 24.22 24.2 24.03 24.45 24.23

## 2021-03-14 NOTE — Progress Notes (Signed)
° °  Faith Hood     MRN: [id number]      DOB: 06-04-1955   HPI Faith Hood is here for follow up and re-evaluation of chronic medical conditions,in particular uncontrolled hypeertension   ROS Denies recent fever or chills. Denies sinus pressure, nasal congestion, ear pain or sore throat. Denies chest congestion, productive cough or wheezing. Denies chest pains, palpitations and leg swelling Denies abdominal pain, nausea, vomiting,diarrhea or constipation.   Denies dysuria, frequency, hesitancy or incontinence. Denies joint pain, swelling and limitation in mobility. Denies headaches, seizures, numbness, or tingling. Denies depression, anxiety or insomnia. Denies skin break down or rash.   PE  BP (!) 170/100    Pulse 97    Resp 16    Ht 5\' 4"  (1.626 m)    Wt 144 lb 12.8 oz (65.7 kg)    SpO2 98%    BMI 24.85 kg/m   Patient alert and oriented and in no cardiopulmonary distress.  HEENT: No facial asymmetry, EOMI,     Neck supple .  Chest: Clear to auscultation bilaterally.  CVS: S1, S2 no murmurs, no S3.Regular rate.  ABD: Soft non tender.   Ext: No edema  MS: Adequate ROM spine, shoulders, hips and knees.  Skin: Intact, no ulcerations or rash noted.  Psych: Good eye contact, normal affect. Memory intact not anxious or depressed appearing.  CNS: CN 2-12 intact, power,  normal throughout.no focal deficits noted.   Assessment & Plan  Hypertension Uncontrolled, start hyzaar 100/25 daily, re eval in 6 weeks DASH diet and commitment to daily physical activity for a minimum of 30 minutes discussed and encouraged, as a part of hypertension management. The importance of attaining a healthy weight is also discussed.  BP/Weight 03/06/2021 02/26/2021 01/09/2021 12/19/2020 12/04/2020 11/21/2020 9/79/4801  Systolic BP 655 374 827 078 675 449 201  Diastolic BP 007 84 81 80 90 87 79  Wt. (Lbs) 144.8 143.8 141.12 141 140 138 136.8  BMI 24.85 24.68 24.22 24.2 24.03 24.45 24.23        Hyperlipidemia Hyperlipidemia:Low fat diet discussed and encouraged.   Lipid Panel  Lab Results  Component Value Date   CHOL 164 12/04/2020   HDL 47 12/04/2020   LDLCALC 104 (H) 12/04/2020   TRIG 67 12/04/2020   CHOLHDL 3.5 12/04/2020

## 2021-03-17 ENCOUNTER — Encounter: Payer: Self-pay | Admitting: Nurse Practitioner

## 2021-03-17 ENCOUNTER — Other Ambulatory Visit: Payer: Self-pay

## 2021-03-17 ENCOUNTER — Ambulatory Visit: Payer: BC Managed Care – PPO | Admitting: Nurse Practitioner

## 2021-03-17 VITALS — BP 184/83 | HR 63 | Ht 64.0 in | Wt 146.6 lb

## 2021-03-17 DIAGNOSIS — E042 Nontoxic multinodular goiter: Secondary | ICD-10-CM | POA: Diagnosis not present

## 2021-03-17 DIAGNOSIS — E063 Autoimmune thyroiditis: Secondary | ICD-10-CM | POA: Diagnosis not present

## 2021-03-17 DIAGNOSIS — I10 Essential (primary) hypertension: Secondary | ICD-10-CM

## 2021-03-17 DIAGNOSIS — E038 Other specified hypothyroidism: Secondary | ICD-10-CM

## 2021-03-17 MED ORDER — LEVOTHYROXINE SODIUM 88 MCG PO TABS: 88.0000 ug | ORAL_TABLET | Freq: Every day | ORAL | 1 refills | Status: DC

## 2021-03-17 NOTE — Progress Notes (Signed)
Endocrinology Follow Up Note                                         03/17/2021, 1:21 PM  Subjective:   Subjective    Faith Hood is a 66 y.o.-year-old female patient being seen in follow up after being seen in consultation for hypothyroidism referred by Fayrene Helper, MD.   Past Medical History:  Diagnosis Date   Cerebral vascular disease    Carotid ultrasound in 10/2011: Mild plaque; tortuous vessels; no definite luminal obstruction.   Elevated LFTs    with positive ASMA and mildly elevated IgG, no biopsy pursued as LFTs normalized.   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  Repeat in 2030.   DIAGNOSTIC LAPAROSCOPY  1978   Gynecologic problems    Social History   Socioeconomic History   Marital status: Single    Spouse name: Not on file   Number of children: 0   Years of education: Not on file   Highest education level: Not on file  Occupational History   Occupation: Middle school teacher retired    Comment: X25 years  Tobacco Use   Smoking status: Never   Smokeless tobacco: Never  Vaping Use   Vaping Use: Never used  Substance and Sexual Activity   Alcohol use: Not Currently    Comment: occasionally. About twice a year.    Drug use: No   Sexual activity: Not Currently  Other Topics Concern   Not on file  Social History Narrative   Lives with sister occasionally   Right Handed   Drinks caffeine occassionally   Social Determinants of Health   Financial Resource Strain: Not on file  Food Insecurity: Not on file  Transportation Needs:  Not on file  Physical Activity: Not on file  Stress: Not on file  Social Connections: Not on file    Family History  Problem Relation Age of Onset   Pancreatic cancer Mother 11       Diagnosed in 2009; currently in hospice pt. htn   Hypertension Mother        + Sister x4   Cancer Father 62       brain tumor   Hypertension Sister    Hypertension Sister    Hypertension Sister    Stroke Sister    Hypertension Sister    Cancer Maternal Grandmother    Cancer Paternal Grandmother    Colon cancer Neg Hx     Outpatient Encounter Medications as of 03/17/2021  Medication Sig   alendronate (FOSAMAX) 70 MG tablet Take 1  tablet (70 mg total) by mouth every 7 (seven) days. Take with a full glass of water on an empty stomach.   atorvastatin (LIPITOR) 20 MG tablet Take 1 tablet (20 mg) by mouth once daily   Calcium Carbonate-Vitamin D (CALCIUM 500 + D PO) Take by mouth 2 (two) times daily.   losartan-hydrochlorothiazide (HYZAAR) 100-25 MG tablet Take 1 tablet by mouth daily.   potassium chloride (KLOR-CON) 10 MEQ tablet Take 1 tablet (10 mEq total) by mouth daily.   [DISCONTINUED] levothyroxine (SYNTHROID) 75 MCG tablet Take 1 tablet (75 mcg total) by mouth daily before breakfast.   levothyroxine (SYNTHROID) 88 MCG tablet Take 1 tablet (88 mcg total) by mouth daily before breakfast.   No facility-administered encounter medications on file as of 03/17/2021.    ALLERGIES: No Known Allergies VACCINATION STATUS: Immunization History  Administered Date(s) Administered   Influenza Split 12/29/2010, 11/10/2011   Influenza Whole 01/24/2009, 12/17/2009   Influenza,inj,Quad PF,6+ Mos 12/14/2012, 01/31/2014, 01/09/2015, 02/04/2016, 11/23/2016, 12/06/2017, 12/12/2018, 12/26/2019   Influenza-Unspecified 01/09/2021   Janssen (J&J) SARS-COV-2 Vaccination 11/28/2019   Moderna Sars-Covid-2 Vaccination 10/30/2019, 04/28/2020, 02/19/2021   PPD Test 12/14/2012, 12/19/2012   Td 12/17/2009   Tdap  05/08/2020   Zoster Recombinat (Shingrix) 02/03/2018, 06/27/2018     HPI   Faith Hood is a patient with the above medical history. she was diagnosed with hypothyroidism at approximate age of 11 years (newly diagnosed in March 2022), which required subsequent initiation of thyroid hormone supplementation. she was given various doses of Levothyroxine since, currently on 50 micrograms. she reports compliance to this medication:  Taking it daily on empty stomach  with water, separated by >30 minutes before breakfast and other medications , and by at least 4 hours from calcium, iron, PPIs, multivitamins.  I reviewed patient's thyroid tests:  Lab Results  Component Value Date   TSH 5.260 (H) 03/13/2021   TSH 2.929 12/04/2020   TSH 6.100 (H) 11/13/2020   TSH 5.550 (H) 08/27/2020   TSH 4.440 06/10/2020   TSH 42.500 (H) 05/01/2020   TSH 1.38 11/28/2019   TSH 7.20 (H) 06/23/2018   TSH 2.49 01/04/2018   TSH 2.76 11/20/2016   FREET4 0.86 03/13/2021   FREET4 0.83 11/13/2020   FREET4 0.93 08/27/2020   FREET4 1.04 06/10/2020   FREET4 0.9 06/23/2018   FREET4 0.67 (L) 03/31/2014   She also had thyroid ultrasound in 08/2020 which showed multiple small nodules bilaterally, none of which met criteria for dedicated follow up or biopsy.  Pt describes: - fatigue - dry skin - dizziness  Pt denies feeling nodules in neck, hoarseness, dysphagia/odynophagia, SOB with lying down.  she does family history of thyroid disorders in her sister (unsure of which but knows it was surgically removed).  She does report her sister was unable to tolerate the generic form of the hormone and has done well with branded Synthroid.  No family history of thyroid cancer.  No history of radiation therapy to head or neck.  No recent use of iodine supplements.  Denies use of Biotin containing supplements.  I reviewed her chart and she also has a history of HTN, HLD, transaminitis.   Review of systems  Constitutional: +  steadily increasing body weight,  current Body mass index is 25.16 kg/m. , + fatigue, no subjective hyperthermia, no subjective hypothermia Eyes: no blurry vision, no xerophthalmia ENT: no sore throat, no nodules palpated in throat, no dysphagia/odynophagia, no hoarseness Cardiovascular: no chest pain, no shortness of breath, no palpitations,  no leg swelling Respiratory: no cough, no shortness of breath Gastrointestinal: no nausea/vomiting/diarrhea Musculoskeletal: no muscle/joint aches Skin: no rashes, no hyperemia Neurological: no tremors, no numbness, no tingling, no dizziness Psychiatric: no depression, no anxiety   Objective:   Objective     BP (!) 184/83    Pulse 63    Ht '5\' 4"'  (1.626 m)    Wt 146 lb 9.6 oz (66.5 kg)    SpO2 99%    BMI 25.16 kg/m  Wt Readings from Last 3 Encounters:  03/17/21 146 lb 9.6 oz (66.5 kg)  03/06/21 144 lb 12.8 oz (65.7 kg)  02/26/21 143 lb 12.8 oz (65.2 kg)    BP Readings from Last 3 Encounters:  03/17/21 (!) 184/83  03/06/21 (!) 170/100  02/26/21 (!) 141/84     Physical Exam- Limited  Constitutional:  Body mass index is 25.16 kg/m. , not in acute distress, normal state of mind Eyes:  EOMI, no exophthalmos Neck: Supple Cardiovascular: RRR, no murmurs, rubs, or gallops, no edema Respiratory: Adequate breathing efforts, no crackles, rales, rhonchi, or wheezing Musculoskeletal: no gross deformities, strength intact in all four extremities, no gross restriction of joint movements Skin:  no rashes, no hyperemia Neurological: no tremor with outstretched hands   CMP ( most recent) CMP     Component Value Date/Time   NA 141 12/04/2020 1428   NA 141 08/27/2020 1517   K 3.8 12/04/2020 1428   CL 108 12/04/2020 1428   CO2 26 12/04/2020 1428   GLUCOSE 89 12/04/2020 1428   BUN 14 12/04/2020 1428   BUN 14 08/27/2020 1517   CREATININE 0.66 12/04/2020 1428   CREATININE 0.79 11/28/2019 1449   CALCIUM 9.3 12/04/2020 1428   PROT 7.3 12/04/2020  1428   PROT 7.7 08/27/2020 1517   ALBUMIN 4.1 12/04/2020 1428   ALBUMIN 5.0 (H) 08/27/2020 1517   AST 24 12/04/2020 1428   ALT 19 12/04/2020 1428   ALKPHOS 80 12/04/2020 1428   BILITOT 0.6 12/04/2020 1428   BILITOT 0.4 08/27/2020 1517   GFRNONAA >60 12/04/2020 1428   GFRNONAA 80 11/28/2019 1449   GFRAA 93 11/28/2019 1449     Diabetic Labs (most recent): Lab Results  Component Value Date   HGBA1C 5.5 06/23/2018   HGBA1C 5.4 05/15/2016   HGBA1C 5.4 02/04/2016     Lipid Panel ( most recent) Lipid Panel     Component Value Date/Time   CHOL 164 12/04/2020 1427   CHOL 172 08/27/2020 1517   TRIG 67 12/04/2020 1427   HDL 47 12/04/2020 1427   HDL 42 08/27/2020 1517   CHOLHDL 3.5 12/04/2020 1427   VLDL 13 12/04/2020 1427   LDLCALC 104 (H) 12/04/2020 1427   LDLCALC 111 (H) 08/27/2020 1517   LDLCALC 143 (H) 09/14/2019 1340   LABVLDL 19 08/27/2020 1517       Lab Results  Component Value Date   TSH 5.260 (H) 03/13/2021   TSH 2.929 12/04/2020   TSH 6.100 (H) 11/13/2020   TSH 5.550 (H) 08/27/2020   TSH 4.440 06/10/2020   TSH 42.500 (H) 05/01/2020   TSH 1.38 11/28/2019   TSH 7.20 (H) 06/23/2018   TSH 2.49 01/04/2018   TSH 2.76 11/20/2016   FREET4 0.86 03/13/2021   FREET4 0.83 11/13/2020   FREET4 0.93 08/27/2020   FREET4 1.04 06/10/2020   FREET4 0.9 06/23/2018   FREET4 0.67 (L) 03/31/2014    Thyroid US from 09/06/20 CLINICAL DATA:  Hypothyroidism   EXAM: THYROID ULTRASOUND  TECHNIQUE: Ultrasound examination of the thyroid gland and adjacent soft tissues was performed.   COMPARISON:  07/19/2018 and previous back to 04/09/2014   FINDINGS: Parenchymal Echotexture: Moderately heterogenous   Isthmus: 1.4 cm thickness, previously 1.2   Right lobe: 6.1 x 1.2 x 2.3 cm, previously 4.7 x 2.4 x 2.7   Left lobe: 5.9 x 3 x 2.6 cm, previously 5 x 2.5 x 2.3   _________________________________________________________   Estimated total number of nodules >/= 1 cm: 3    Number of spongiform nodules >/=  2 cm not described below (TR1): 0   Number of mixed cystic and solid nodules >/= 1.5 cm not described below ([place]): 0   _________________________________________________________   Nodule # 1:   Prior biopsy: No   Location: Right; superior   Maximum size: 1.6 cm; Other 2 dimensions: 1.1 x 1.4 cm, previously, 1.4 x 1.2 x 1.4 cm   Composition: solid/almost completely solid (2)   Echogenicity: hyperechoic (1)   Shape: not taller-than-wide (0)   Margins: smooth (0)   Echogenic foci: none (0)   ACR TI-RADS total points: 3.   ACR TI-RADS risk category:  TR3 (3 points).   Significant change in size (>/= 20% in two dimensions and minimal increase of 2 mm): No   Change in features: No   Change in ACR TI-RADS risk category: No   ACR TI-RADS recommendations:   *Given size (>/= 1.5 - 2.4 cm) and appearance, a follow-up ultrasound in 1 year should be considered based on TI-RADS criteria.   _________________________________________________________   Nodule # 2: 0.6 cm calcification, mid left, stable; This nodule does NOT meet TI-RADS criteria for biopsy or dedicated follow-up.   _________________________________________________________   Nodule # 3:   Location: Left; superior   Maximum size: 1.2 cm; Other 2 dimensions: 0.8 x 0.9 cm   Composition: solid/almost completely solid (2)   Echogenicity: hyperechoic (1)   Shape: not taller-than-wide (0)   Margins: smooth (0)   Echogenic foci: none (0)   ACR TI-RADS total points: 3.   ACR TI-RADS risk category: TR3 (3 points).   ACR TI-RADS recommendations:   Given size (<1.4 cm) and appearance, this nodule does NOT meet TI-RADS criteria for biopsy or dedicated follow-up.   _________________________________________________________   Nodule # 4:   Location: Isthmus; right of midline   Maximum size: 1.2 cm; Other 2 dimensions: 1 x 0.8 cm   Composition: solid/almost completely  solid (2)   Echogenicity: isoechoic (1)   Shape: not taller-than-wide (0)   Margins: ill-defined (0)   Echogenic foci: none (0)   ACR TI-RADS total points: 3.   ACR TI-RADS risk category: TR3 (3 points).   ACR TI-RADS recommendations:   Given size (<1.4 cm) and appearance, this nodule does NOT meet TI-RADS criteria for biopsy or dedicated follow-up.   IMPRESSION: 1. Enlarged heterogenous thyroid with nodules as above. None meet criteria for biopsy. 2. Recommend annual/biennial ultrasound follow-up of right nodule as above, until stability x5 years confirmed.   The above is in keeping with the ACR TI-RADS recommendations - J Am Coll Radiol 2017;14:587-595.     Electronically Signed   By: Lucrezia Europe M.D.   On: 09/07/2020 08:13  Results for RILYA, LONGO (MRN [id number]) as of 11/21/2020 14:11  Ref. Range 11/13/2020 14:05  TSH Latest Ref Range: 0.450 - 4.500 uIU/mL 6.100 (H)  Triiodothyronine,Free,Serum Latest Ref Range: 2.0 - 4.4 pg/mL 2.9  T4,Free(Direct) Latest Ref Range: 0.82 - 1.77 ng/dL 0.83  Thyroperoxidase Ab SerPl-aCnc Latest Ref Range: 0 - 34 IU/mL >600 (H)  Thyroglobulin Antibody Latest Ref Range: 0.0 - 0.9 IU/mL 1,435.2 (H)    Latest Reference Range & Units 06/10/20 08:10 08/27/20 15:17 11/13/20 14:05 12/04/20 14:27 03/13/21 16:03  TSH 0.450 - 4.500 uIU/mL 4.440 5.550 (H) 6.100 (H) 2.929 5.260 (H)  Triiodothyronine,Free,Serum 2.0 - 4.4 pg/mL  2.8 2.9    T4,Free(Direct) 0.82 - 1.77 ng/dL 1.04 0.93 0.83  0.86  Thyroperoxidase Ab SerPl-aCnc 0 - 34 IU/mL   >600 (H)    Thyroglobulin Antibody 0.0 - 0.9 IU/mL   1,435.2 (H)    (H): Data is abnormally high  Assessment & Plan:   ASSESSMENT / PLAN:  1. Hypothyroidism- r/t Hashimoto's thyroiditis   Patient with relatively new hypothyroidism, on levothyroxine therapy. Her antibody testing confirms suspicion of autoimmune thyroid dysfunction.    Her previsit thyroid function tests are consistent with under-replacement.   She will tolerate increase of her Levothyroxine to 88 mcg po daily before breakfast.    - We discussed about correct intake of levothyroxine, at fasting, with water, separated by at least 30 minutes from breakfast, and separated by more than 4 hours from calcium, iron, multivitamins, acid reflux medications (PPIs). -Patient is made aware of the fact that thyroid hormone replacement is needed for life, dose to be adjusted by periodic monitoring of thyroid function tests.  2. Multinodular thyroid Multiple thyroid nodules recommending follow up with surveillance ultrasound in 1 year (around July 2023).  3. Hypertension I advised her to reach out to her PCP regarding high BP readings, despite recent medication change.  I also encouraged patient to monitor BP at home and give her reading to her PCP.   I spent 20 minutes in the care of the patient today including review of labs from Thyroid Function, CMP, and other relevant labs ; imaging/biopsy records (current and previous including abstractions from other facilities); face-to-face time discussing  her lab results and symptoms, medications doses, her options of short and long term treatment based on the latest standards of care / guidelines;   and documenting the encounter.  Fredrika P Askari  participated in the discussions, expressed understanding, and voiced agreement with the above plans.  All questions were answered to her satisfaction. she is encouraged to contact clinic should she have any questions or concerns prior to her return visit.   FOLLOW UP PLAN:  Return in about 2 months (around 05/15/2021) for Thyroid follow up, Previsit labs.  Rayetta Pigg, [facility] [address] [place], [place] [address] Phone: [phone] Fax: [phone]  03/17/2021, 1:21 PM

## 2021-03-17 NOTE — Patient Instructions (Signed)

## 2021-03-28 ENCOUNTER — Telehealth: Payer: Self-pay | Admitting: Gastroenterology

## 2021-03-28 NOTE — Telephone Encounter (Signed)
Received and reviewed blood work from [facility] rheumatology completed 01/31/2021.  LFTs were within normal limits.  Hemoglobin and platelets also within normal limits.    No additional recommendations at this time. We will have her repeat blood work around April as planned.

## 2021-04-17 ENCOUNTER — Ambulatory Visit: Payer: BC Managed Care – PPO | Admitting: Family Medicine

## 2021-04-22 ENCOUNTER — Other Ambulatory Visit: Payer: Self-pay

## 2021-04-22 DIAGNOSIS — E7849 Other hyperlipidemia: Secondary | ICD-10-CM

## 2021-04-23 LAB — LIPID PANEL
Chol/HDL Ratio: 3.4 ratio (ref 0.0–4.4)
Cholesterol, Total: 129 mg/dL (ref 100–199)
HDL: 38 mg/dL — ABNORMAL LOW (ref >39)
LDL Chol Calc (NIH): 75 mg/dL (ref 0–99)
Triglycerides: 82 mg/dL (ref 0–149)
VLDL Cholesterol Cal: 16 mg/dL (ref 5–40)

## 2021-04-23 LAB — CMP14+EGFR
ALT: 16 IU/L (ref 0–32)
AST: 17 IU/L (ref 0–40)
Albumin/Globulin Ratio: 1.5 (ref 1.2–2.2)
Albumin: 4.2 g/dL (ref 3.8–4.8)
Alkaline Phosphatase: 76 IU/L (ref 44–121)
BUN/Creatinine Ratio: 15 (ref 12–28)
BUN: 13 mg/dL (ref 8–27)
Bilirubin Total: 0.4 mg/dL (ref 0.0–1.2)
CO2: 26 mmol/L (ref 20–29)
Calcium: 9.4 mg/dL (ref 8.7–10.3)
Chloride: 103 mmol/L (ref 96–106)
Creatinine, Ser: 0.87 mg/dL (ref 0.57–1.00)
Globulin, Total: 2.8 g/dL (ref 1.5–4.5)
Glucose: 90 mg/dL (ref 70–99)
Potassium: 3.9 mmol/L (ref 3.5–5.2)
Sodium: 141 mmol/L (ref 134–144)
Total Protein: 7 g/dL (ref 6.0–8.5)
eGFR: 75 mL/min/{1.73_m2} (ref >59)

## 2021-04-25 ENCOUNTER — Other Ambulatory Visit: Payer: Self-pay

## 2021-04-25 ENCOUNTER — Ambulatory Visit: Payer: BC Managed Care – PPO | Admitting: Family Medicine

## 2021-04-25 ENCOUNTER — Encounter: Payer: Self-pay | Admitting: Family Medicine

## 2021-04-25 VITALS — BP 158/88 | HR 75 | Resp 16 | Ht 64.0 in | Wt 147.8 lb

## 2021-04-25 DIAGNOSIS — I10 Essential (primary) hypertension: Secondary | ICD-10-CM

## 2021-04-25 DIAGNOSIS — E7849 Other hyperlipidemia: Secondary | ICD-10-CM

## 2021-04-25 DIAGNOSIS — M25572 Pain in left ankle and joints of left foot: Secondary | ICD-10-CM | POA: Diagnosis not present

## 2021-04-25 MED ORDER — PREDNISONE 5 MG PO TABS: 5.0000 mg | ORAL_TABLET | Freq: Two times a day (BID) | ORAL | 0 refills | Status: AC

## 2021-04-25 MED ORDER — AMLODIPINE BESYLATE 5 MG PO TABS: 5.0000 mg | ORAL_TABLET | Freq: Every day | ORAL | 2 refills | Status: DC

## 2021-04-25 NOTE — Progress Notes (Signed)
? ?  Faith Hood     MRN: [id number]      DOB: 07/21/1955 ? ? ?HPI ?Faith Hood is here for follow up and re-evaluation of chronic medical conditions, medication management and review of any available recent lab and radiology data.  ?Preventive health is updated, specifically  Cancer screening and Immunization.   ?. ?The PT denies any adverse reactions to current medications since the last visit.  ?Left hand throbbing yesterday am, some relief with tylenol,  now 2 ?Left ankle pain started today 8 on standing ?ROS ?Denies recent fever or chills. ?Denies sinus pressure, nasal congestion, ear pain or sore throat. ?Denies chest congestion, productive cough or wheezing. ?Denies chest pains, palpitations and leg swelling ?Denies abdominal pain, nausea, vomiting,diarrhea or constipation.   ?Denies dysuria, frequency, hesitancy or incontinence. ?. ?Denies headaches, seizures, numbness, or tingling. ?Denies depression, anxiety or insomnia. ?Denies skin break down or rash. ? ? ?PE ? ?BP (!) 158/88   Pulse 75   Resp 16   Ht 5\' 4"  (1.626 m)   Wt 147 lb 12.8 oz (67 kg)   SpO2 100%   BMI 25.37 kg/m?  ? ?Patient alert and oriented and in no cardiopulmonary distress. ? ?HEENT: No facial asymmetry, EOMI,     Neck supple . ? ?Chest: Clear to auscultation bilaterally. ? ?CVS: S1, S2 no murmurs, no S3.Regular rate. ? ?ABD: Soft non tender.  ? ?Ext: No edema ? ?MS: Adequate ROM spine, shoulders, hips and knees. ? ?Skin: Intact, no ulcerations or rash noted. ? ?Psych: Good eye contact, normal affect. Memory intact not anxious or depressed appearing. ? ?CNS: CN 2-12 intact, power,  normal throughout.no focal deficits noted. ? ? ?Assessment & Plan ? ?Hypertension ?Uncontrolled , add amlodipine f/u I 8 to 10 weeks ?DASH diet and commitment to daily physical activity for a minimum of 30 minutes discussed and encouraged, as a part of hypertension management. ?The importance of attaining a healthy weight is also discussed. ? ?BP/Weight  04/25/2021 03/17/2021 03/06/2021 02/26/2021 01/09/2021 12/19/2020 12/04/2020  ?Systolic BP 557 322 025 427 156 146 150  ?Diastolic BP 88 83 062 84 81 80 90  ?Wt. (Lbs) 147.8 146.6 144.8 143.8 141.12 141 140  ?BMI 25.37 25.16 24.85 24.68 24.22 24.2 24.03  ? ? ? ? ? ?Left ankle pain ?Short course of prednisone prescribed, recommend call Rheumatologist if persists ? ?Hyperlipidemia ?Hyperlipidemia:Low fat diet discussed and encouraged. ? ? ?Lipid Panel  ?Lab Results  ?Component Value Date  ? CHOL 129 04/22/2021  ? HDL 38 (L) 04/22/2021  ? [place] 75 04/22/2021  ? TRIG 82 04/22/2021  ? CHOLHDL 3.4 04/22/2021  ? ?Need to increase exercise ? ? ? ?

## 2021-04-25 NOTE — Patient Instructions (Addendum)
Follow-up in 10 weeks call if you need me sooner. ? ?New additional medicine for blood pressure amlodipine 5 mg 1 daily continue hyzaar  as before. ? ?New for ankle and hand pain is 5-day course of prednisone.  If you continue to have significant flares of joint pain please contact your rheumatologist. ? ?It is important that you exercise regularly at least 30 minutes 5 times a week. If you develop chest pain, have severe difficulty breathing, or feel very tired, stop exercising immediately and seek medical attention  ? ?Think about what you will eat, plan ahead. ?Choose " clean, green, fresh or frozen" over canned, processed or packaged foods which are more sugary, salty and fatty. ?70 to 75% of food eaten should be vegetables and fruit. ?Three meals at set times with snacks allowed between meals, but they must be fruit or vegetables. ?Aim to eat over a 12 hour period , example 7 am to 7 pm, and STOP after  your last meal of the day. ?Drink water,generally about 64 ounces per day, no other drink is as healthy. Fruit juice is best enjoyed in a healthy way, by EATING the fruit. ?Thanks for choosing [facility], we consider it a privelige to serve you ?

## 2021-04-28 ENCOUNTER — Encounter: Payer: Self-pay | Admitting: Family Medicine

## 2021-04-28 DIAGNOSIS — M25572 Pain in left ankle and joints of left foot: Secondary | ICD-10-CM | POA: Insufficient documentation

## 2021-04-28 NOTE — Assessment & Plan Note (Signed)
Hyperlipidemia:Low fat diet discussed and encouraged. ? ? ?Lipid Panel  ?Lab Results  ?Component Value Date  ? CHOL 129 04/22/2021  ? HDL 38 (L) 04/22/2021  ? [place] 75 04/22/2021  ? TRIG 82 04/22/2021  ? CHOLHDL 3.4 04/22/2021  ? ?Need to increase exercise ? ? ?

## 2021-04-28 NOTE — Assessment & Plan Note (Addendum)
Short course of prednisone prescribed, recommend call Rheumatologist if persists ?

## 2021-04-28 NOTE — Assessment & Plan Note (Signed)
Uncontrolled , add amlodipine f/u I 8 to 10 weeks ?DASH diet and commitment to daily physical activity for a minimum of 30 minutes discussed and encouraged, as a part of hypertension management. ?The importance of attaining a healthy weight is also discussed. ? ?BP/Weight 04/25/2021 03/17/2021 03/06/2021 02/26/2021 01/09/2021 12/19/2020 12/04/2020  ?Systolic BP 016 010 932 355 156 146 150  ?Diastolic BP 88 83 732 84 81 80 90  ?Wt. (Lbs) 147.8 146.6 144.8 143.8 141.12 141 140  ?BMI 25.37 25.16 24.85 24.68 24.22 24.2 24.03  ? ? ? ? ?

## 2021-05-13 LAB — T4, FREE: Free T4: 1.22 ng/dL (ref 0.82–1.77)

## 2021-05-13 LAB — TSH: TSH: 1.64 u[IU]/mL (ref 0.450–4.500)

## 2021-05-15 ENCOUNTER — Encounter: Payer: Self-pay | Admitting: Nurse Practitioner

## 2021-05-15 ENCOUNTER — Other Ambulatory Visit: Payer: Self-pay

## 2021-05-15 ENCOUNTER — Ambulatory Visit: Payer: BC Managed Care – PPO | Admitting: Nurse Practitioner

## 2021-05-15 VITALS — BP 152/77 | HR 56 | Ht 64.0 in | Wt 147.0 lb

## 2021-05-15 DIAGNOSIS — E042 Nontoxic multinodular goiter: Secondary | ICD-10-CM

## 2021-05-15 DIAGNOSIS — E038 Other specified hypothyroidism: Secondary | ICD-10-CM

## 2021-05-15 DIAGNOSIS — E063 Autoimmune thyroiditis: Secondary | ICD-10-CM

## 2021-05-15 MED ORDER — LEVOTHYROXINE SODIUM 88 MCG PO TABS: 88.0000 ug | ORAL_TABLET | Freq: Every day | ORAL | 1 refills | Status: DC

## 2021-05-15 NOTE — Progress Notes (Signed)
?                                   ?                                Endocrinology Follow Up Note  ?                                       05/15/2021, 1:11 PM ? ?Subjective:  ? ?Subjective   ? ?Faith Hood is a 66 y.o.-year-old female patient being seen in follow up after being seen in consultation for hypothyroidism referred by Fayrene Helper, MD. ? ? ?Past Medical History:  ?Diagnosis Date  ? Cerebral vascular disease   ? Carotid ultrasound in 10/2011: Mild plaque; tortuous vessels; no definite luminal obstruction.  ? Elevated LFTs   ? with positive ASMA and mildly elevated IgG, no biopsy pursued as LFTs normalized.  ? Hepatic steatosis   ? By CT scan  ? Hyperlipidemia   ? Hypertension   ? Hypothyroidism due to Hashimoto's thyroiditis 2021  ? Overweight(278.02)   ? 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 Uterine leiomyoma   ? By CT scan  ? ? ?Past Surgical History:  ?Procedure Laterality Date  ? COLONOSCOPY  12/2007  ? Negative screening study  ? COLONOSCOPY N/A 08/17/2018  ? Surgeon: Daneil Dolin, MD; normal exam.  Repeat in 2030.  ? DIAGNOSTIC LAPAROSCOPY  1978  ? Gynecologic problems  ? ? ?Social History  ? ?Socioeconomic History  ? Marital status: Single  ?  Spouse name: Not on file  ? Number of children: 0  ? Years of education: Not on file  ? Highest education level: Not on file  ?Occupational History  ? Occupation: Middle Education officer, museum retired  ?  Comment: X25 years  ?Tobacco Use  ? Smoking status: Never  ? Smokeless tobacco: Never  ?Vaping Use  ? Vaping Use: Never used  ?Substance and Sexual Activity  ? Alcohol use: Not Currently  ?  Comment: occasionally. About twice a year.   ? Drug use: No  ? Sexual activity: Not Currently  ?Other Topics Concern  ? Not on file  ?Social History Narrative  ? Lives with sister occasionally  ? Right Handed  ? Drinks caffeine occassionally  ? ?Social Determinants of Health  ? ?Financial Resource Strain: Not on file  ?Food Insecurity: Not on file  ?Transportation Needs:  Not on file  ?Physical Activity: Not on file  ?Stress: Not on file  ?Social Connections: Not on file  ? ? ?Family History  ?Problem Relation Age of Onset  ? Pancreatic cancer Mother 56  ?     Diagnosed in 2009; currently in hospice pt. htn  ? Hypertension Mother   ?     + Sister x4  ? Cancer Father 69  ?     brain tumor  ? Hypertension Sister   ? Hypertension Sister   ? Hypertension Sister   ? Stroke Sister   ? Hypertension Sister   ? Cancer Maternal Grandmother   ? Cancer Paternal Grandmother   ? Colon cancer Neg Hx   ? ? ?Outpatient Encounter Medications as of 05/15/2021  ?Medication Sig  ? alendronate (FOSAMAX) 70 MG tablet Take 1  tablet (70 mg total) by mouth every 7 (seven) days. Take with a full glass of water on an empty stomach.  ? amLODipine (NORVASC) 5 MG tablet Take 1 tablet (5 mg total) by mouth daily.  ? atorvastatin (LIPITOR) 20 MG tablet Take 1 tablet (20 mg) by mouth once daily  ? Calcium Carbonate-Vitamin D (CALCIUM 500 + D PO) Take by mouth 2 (two) times daily.  ? levothyroxine (SYNTHROID) 88 MCG tablet Take 1 tablet (88 mcg total) by mouth daily before breakfast.  ? losartan-hydrochlorothiazide (HYZAAR) 100-25 MG tablet Take 1 tablet by mouth daily.  ? potassium chloride (KLOR-CON) 10 MEQ tablet Take 1 tablet (10 mEq total) by mouth daily.  ? [DISCONTINUED] levothyroxine (SYNTHROID) 88 MCG tablet Take 1 tablet (88 mcg total) by mouth daily before breakfast.  ? ?No facility-administered encounter medications on file as of 05/15/2021.  ? ? ?ALLERGIES: ?No Known Allergies ?VACCINATION STATUS: ?Immunization History  ?Administered Date(s) Administered  ? Influenza Split 12/29/2010, 11/10/2011  ? Influenza Whole 01/24/2009, 12/17/2009  ? Influenza,inj,Quad PF,6+ Mos 12/14/2012, 01/31/2014, 01/09/2015, 02/04/2016, 11/23/2016, 12/06/2017, 12/12/2018, 12/26/2019  ? Influenza-Unspecified 01/09/2021  ? Janssen (J&J) SARS-COV-2 Vaccination 11/28/2019  ? Moderna Sars-Covid-2 Vaccination 11/02/2019, 11/30/2019,  04/28/2020, 02/19/2021  ? PPD Test 12/14/2012, 12/19/2012  ? Td 12/17/2009  ? Tdap 05/08/2020  ? Zoster Recombinat (Shingrix) 02/03/2018, 06/27/2018  ? ? ? ?HPI  ? ?Faith Hood is a patient with the above medical history. she was diagnosed with hypothyroidism at approximate age of 41 years (newly diagnosed in March 2022), which required subsequent initiation of thyroid hormone supplementation. she was given various doses of Levothyroxine since, currently on 88 micrograms. she reports compliance to this medication:  Taking it daily on empty stomach  with water, separated by >30 minutes before breakfast and other medications , and by at least 4 hours from calcium, iron, PPIs, multivitamins. ? ?I reviewed patient's thyroid tests: ? ?Lab Results  ?Component Value Date  ? TSH 1.640 05/12/2021  ? TSH 5.260 (H) 03/13/2021  ? TSH 2.929 12/04/2020  ? TSH 6.100 (H) 11/13/2020  ? TSH 5.550 (H) 08/27/2020  ? TSH 4.440 06/10/2020  ? TSH 42.500 (H) 05/01/2020  ? TSH 1.38 11/28/2019  ? TSH 7.20 (H) 06/23/2018  ? TSH 2.49 01/04/2018  ? FREET4 1.22 05/12/2021  ? FREET4 0.86 03/13/2021  ? FREET4 0.83 11/13/2020  ? FREET4 0.93 08/27/2020  ? FREET4 1.04 06/10/2020  ? FREET4 0.9 06/23/2018  ? FREET4 0.67 (L) 03/31/2014  ? She also had thyroid ultrasound in 08/2020 which showed multiple small nodules bilaterally, none of which met criteria for dedicated follow up or biopsy. ? ?Pt denies feeling nodules in neck, hoarseness, dysphagia/odynophagia, SOB with lying down. ? ?she does family history of thyroid disorders in her sister (unsure of which but knows it was surgically removed).  She does report her sister was unable to tolerate the generic form of the hormone and has done well with branded Synthroid.  No family history of thyroid cancer.  ?No history of radiation therapy to head or neck.  No recent use of iodine supplements.  Denies use of Biotin containing supplements. ? ?I reviewed her chart and she also has a history of HTN, HLD,  transaminitis. ? ? ?Review of systems ? ?Constitutional: + Minimally fluctuating body weight,  current Body mass index is 25.23 kg/m?. , + fatigue-improving , no subjective hyperthermia, no subjective hypothermia ?Eyes: no blurry vision, no xerophthalmia ?ENT: no sore throat, no nodules palpated in throat, no  dysphagia/odynophagia, no hoarseness ?Cardiovascular: no chest pain, no shortness of breath, no palpitations, no leg swelling ?Respiratory: no cough, no shortness of breath ?Gastrointestinal: no nausea/vomiting/diarrhea ?Musculoskeletal: + generalized muscle/joint aches- just finished round of prednisone ?Skin: no rashes, no hyperemia ?Neurological: no tremors, no numbness, no tingling, no dizziness ?Psychiatric: no depression, no anxiety ? ? ?Objective:  ? ?Objective   ? ? ?BP (!) 152/77   Pulse (!) 56   Ht _0  (1.626 m)   Wt 147 lb (66.7 kg)   BMI 25.23 kg/m?  ?Wt Readings from Last 3 Encounters:  ?05/15/21 147 lb (66.7 kg)  ?04/25/21 147 lb 12.8 oz (67 kg)  ?03/17/21 146 lb 9.6 oz (66.5 kg)  ? ? ?BP Readings from Last 3 Encounters:  ?05/15/21 (!) 152/77  ?04/25/21 (!) 158/88  ?03/17/21 (!) 184/83  ?  ? ? ?Physical Exam- Limited ? ?Constitutional:  Body mass index is 25.23 kg/m?. , not in acute distress, normal state of mind ?Eyes:  EOMI, no exophthalmos ?Neck: Supple ?Cardiovascular: RRR, no murmurs, rubs, or gallops, no edema ?Respiratory: Adequate breathing efforts, no crackles, rales, rhonchi, or wheezing ?Musculoskeletal: no gross deformities, strength intact in all four extremities, no gross restriction of joint movements ?Skin:  no rashes, no hyperemia ?Neurological: no tremor with outstretched hands ? ? ?CMP ( most recent) ?CMP  ?   ?Component Value Date/Time  ? NA 141 04/22/2021 0940  ? K 3.9 04/22/2021 0940  ? CL 103 04/22/2021 0940  ? CO2 26 04/22/2021 0940  ? GLUCOSE 90 04/22/2021 0940  ? GLUCOSE 89 12/04/2020 1428  ? BUN 13 04/22/2021 0940  ? CREATININE 0.87 04/22/2021 0940  ? CREATININE  0.79 11/28/2019 1449  ? CALCIUM 9.4 04/22/2021 0940  ? PROT 7.0 04/22/2021 0940  ? ALBUMIN 4.2 04/22/2021 0940  ? AST 17 04/22/2021 0940  ? ALT 16 04/22/2021 0940  ? ALKPHOS 76 04/22/2021 0940  ? BILITOT 0.4 02/

## 2021-05-15 NOTE — Patient Instructions (Signed)

## 2021-06-02 ENCOUNTER — Ambulatory Visit (HOSPITAL_COMMUNITY)
Admission: RE | Admit: 2021-06-02 | Discharge: 2021-06-02 | Disposition: A | Discharge status: Home / Self Care | Payer: BC Managed Care – PPO | Source: Ambulatory Visit | Attending: Nurse Practitioner | Admitting: Nurse Practitioner

## 2021-06-02 DIAGNOSIS — E042 Nontoxic multinodular goiter: Secondary | ICD-10-CM | POA: Diagnosis present

## 2021-06-05 ENCOUNTER — Other Ambulatory Visit (HOSPITAL_COMMUNITY)
Admission: RE | Admit: 2021-06-05 | Discharge: 2021-06-05 | Disposition: A | Discharge status: Home / Self Care | Payer: BC Managed Care – PPO | Source: Ambulatory Visit | Attending: Gastroenterology | Admitting: Gastroenterology

## 2021-06-05 DIAGNOSIS — R748 Abnormal levels of other serum enzymes: Secondary | ICD-10-CM | POA: Insufficient documentation

## 2021-06-05 LAB — HEPATIC FUNCTION PANEL
ALT: 18 U/L (ref 0–44)
AST: 23 U/L (ref 15–41)
Albumin: 3.8 g/dL (ref 3.5–5.0)
Alkaline Phosphatase: 60 U/L (ref 38–126)
Bilirubin, Direct: 0.1 mg/dL (ref 0.0–0.2)
Indirect Bilirubin: 0.5 mg/dL (ref 0.3–0.9)
Total Bilirubin: 0.6 mg/dL (ref 0.3–1.2)
Total Protein: 7.4 g/dL (ref 6.5–8.1)

## 2021-06-20 ENCOUNTER — Emergency Department (HOSPITAL_COMMUNITY)
Admission: EM | Admit: 2021-06-20 | Discharge: 2021-06-20 | Disposition: A | Discharge status: Home / Self Care | Payer: BC Managed Care – PPO | Attending: Emergency Medicine | Admitting: Emergency Medicine

## 2021-06-20 ENCOUNTER — Other Ambulatory Visit: Payer: Self-pay

## 2021-06-20 ENCOUNTER — Encounter (HOSPITAL_COMMUNITY): Payer: Self-pay | Admitting: *Deleted

## 2021-06-20 DIAGNOSIS — R0981 Nasal congestion: Secondary | ICD-10-CM | POA: Insufficient documentation

## 2021-06-20 DIAGNOSIS — Z79899 Other long term (current) drug therapy: Secondary | ICD-10-CM | POA: Insufficient documentation

## 2021-06-20 DIAGNOSIS — R051 Acute cough: Secondary | ICD-10-CM | POA: Diagnosis present

## 2021-06-20 MED ORDER — DOXYCYCLINE HYCLATE 100 MG PO CAPS: 100.0000 mg | ORAL_CAPSULE | Freq: Two times a day (BID) | ORAL | 0 refills | Status: AC

## 2021-06-20 MED ORDER — BENZONATATE 100 MG PO CAPS: 100.0000 mg | ORAL_CAPSULE | Freq: Three times a day (TID) | ORAL | 0 refills | Status: DC

## 2021-06-20 MED ORDER — ALBUTEROL SULFATE HFA 108 (90 BASE) MCG/ACT IN AERS: 2.0000 | INHALATION_SPRAY | RESPIRATORY_TRACT | 3 refills | Status: DC | PRN

## 2021-06-20 NOTE — Discharge Instructions (Addendum)
Your exam is consistent with having a upper respiratory infection.  Sometimes this is related to a bacteria when that lasts over a week.  Your symptoms of been going on for over 7 days and seem to be getting a little bit worse for I would like you to take doxycycline twice a day for the next 7 days, you may use benzonatate for the cough, every 8 hours as needed and you may take the albuterol inhaler 2 puffs every 4 hours as needed. ? ?If you should develop severe or worsening symptoms return to the emergency department immediately. ?

## 2021-06-20 NOTE — ED Triage Notes (Signed)
Pt with nasal congestion on Friday.  Not getting any better, hoarseness today.  +cough, denies any chills or body aches ?

## 2021-06-20 NOTE — ED Notes (Signed)
ED Provider at bedside. 

## 2021-06-20 NOTE — ED Provider Notes (Signed)
?[facility] ?Provider Note ? ? ?CSN: [id number] ?Arrival date & time: 06/20/21  1922 ? ?  ? ?History ? ?Chief Complaint  ?Patient presents with  ? Cough  ? ? ?Faith Hood is a 66 y.o. female. ? ? ?Cough ?Associated symptoms: no fever   ? ?This patient is a 66 year old female presenting to the hospital today with a complaint of a cough.  She has been sick for approximately 1 week which started with some nasal congestion and has evolved into some hoarseness as well as increasing cough which is often productive of phlegm.  No fevers, no chills, no myalgias, no nausea vomiting or diarrhea, nobody has been sick around her.  She has not been seen by her doctor for this illness.  She had been taking a couple of doses of prednisone that were left over from a prior episode of polyarthralgia.  That did not seem to help either.  She has no changes in vision, no stiffness never had, no significant headache and no changes in her hearing ? ?Home Medications ?Prior to Admission medications   ?Medication Sig Start Date End Date Taking? Authorizing Provider  ?albuterol (VENTOLIN HFA) 108 (90 Base) MCG/ACT inhaler Inhale 2 puffs into the lungs every 4 (four) hours as needed for wheezing or shortness of breath. 06/20/21  Yes Noemi Chapel, MD  ?benzonatate (TESSALON) 100 MG capsule Take 1 capsule (100 mg total) by mouth every 8 (eight) hours. 06/20/21  Yes Noemi Chapel, MD  ?doxycycline (VIBRAMYCIN) 100 MG capsule Take 1 capsule (100 mg total) by mouth 2 (two) times daily for 7 days. 06/20/21 06/27/21 Yes Noemi Chapel, MD  ?alendronate (FOSAMAX) 70 MG tablet Take 1 tablet (70 mg total) by mouth every 7 (seven) days. Take with a full glass of water on an empty stomach. 10/09/20   Fayrene Helper, MD  ?amLODipine (NORVASC) 5 MG tablet Take 1 tablet (5 mg total) by mouth daily. 04/25/21   Fayrene Helper, MD  ?atorvastatin (LIPITOR) 20 MG tablet Take 1 tablet (20 mg) by mouth once daily 12/05/20   Donato Heinz, MD  ?Calcium Carbonate-Vitamin D (CALCIUM 500 + D PO) Take by mouth 2 (two) times daily.    [provider]  ?levothyroxine (SYNTHROID) 88 MCG tablet Take 1 tablet (88 mcg total) by mouth daily before breakfast. 05/15/21   Brita Romp, NP  ?losartan-hydrochlorothiazide (HYZAAR) 100-25 MG tablet Take 1 tablet by mouth daily. 03/06/21   Fayrene Helper, MD  ?potassium chloride (KLOR-CON) 10 MEQ tablet Take 1 tablet (10 mEq total) by mouth daily. 01/09/21   Fayrene Helper, MD  ?   ? ?Allergies    ?Patient has no known allergies.   ? ?Review of Systems   ?Review of Systems  ?Constitutional:  Negative for fever.  ?Respiratory:  Positive for cough.   ? ?Physical Exam ?Updated Vital Signs ?BP (!) 152/83 (BP Location: Right Arm)   Pulse 98   Temp 99.3 ?F (37.4 ?C) (Oral)   Resp 18   Ht 1.6 m ('5\' 3"'$ )   Wt 67.6 kg   SpO2 100%   BMI 26.39 kg/m?  ?Physical Exam ?Vitals and nursing note reviewed.  ?Constitutional:   ?   General: She is in acute distress.  ?   Appearance: She is well-developed. She is not diaphoretic.  ?HENT:  ?   Head: Normocephalic and atraumatic.  ?   Ears:  ?   Comments: Bilateral tympanic membranes visualized and normal,  no erythema, no retraction, no fluid ?   Nose: Nose normal.  ?   Mouth/Throat:  ?   Mouth: Mucous membranes are moist.  ?   Pharynx: Posterior oropharyngeal erythema present. No oropharyngeal exudate.  ?Eyes:  ?   General:     ?   Right eye: No discharge.     ?   Left eye: No discharge.  ?   Conjunctiva/sclera: Conjunctivae normal.  ?Cardiovascular:  ?   Rate and Rhythm: Normal rate.  ?Pulmonary:  ?   Effort: Pulmonary effort is normal. No respiratory distress.  ?   Breath sounds: No wheezing.  ?Skin: ?   General: Skin is warm and dry.  ?   Findings: No erythema or rash.  ?Neurological:  ?   Mental Status: She is alert.  ?   Coordination: Coordination normal.  ? ? ?ED Results / Procedures / Treatments   ?Labs ?(all labs ordered are listed, but  only abnormal results are displayed) ?Labs Reviewed - No data to display ? ?EKG ?None ? ?Radiology ?No results found. ? ?Procedures ?Procedures  ? ? ?Medications Ordered in ED ?Medications - No data to display ? ?ED Course/ Medical Decision Making/ A&P ?  ?                        ?Medical Decision Making ?Risk ?Prescription drug management. ? ? ?This patient appears to be in no distress, her lung sounds are overall very clear but she is coughing frequently and this seems to be getting worse over the week.  We will treat with a course of doxycycline, she will get albuterol and benzonatate, she is agreeable to the plan and can follow-up with Dr. Moshe Cipro as an outpatient.  Stable for discharge at this time, patient is agreeable and understands indications for return ? ? ? ? ? ? ? ?Final Clinical Impression(s) / ED Diagnoses ?Final diagnoses:  ?Acute cough  ? ? ?Rx / DC Orders ?ED Discharge Orders   ? ?      Ordered  ?  doxycycline (VIBRAMYCIN) 100 MG capsule  2 times daily       ? 06/20/21 2006  ?  benzonatate (TESSALON) 100 MG capsule  Every 8 hours       ? 06/20/21 2006  ?  albuterol (VENTOLIN HFA) 108 (90 Base) MCG/ACT inhaler  Every 4 hours PRN       ? 06/20/21 2006  ? ?  ?  ? ?  ? ? ?  ?Noemi Chapel, MD ?06/20/21 2008 ? ?

## 2021-07-04 ENCOUNTER — Ambulatory Visit: Payer: BC Managed Care – PPO | Admitting: Family Medicine

## 2021-07-11 ENCOUNTER — Ambulatory Visit: Payer: BC Managed Care – PPO | Admitting: Family Medicine

## 2021-07-11 ENCOUNTER — Encounter: Payer: Self-pay | Admitting: Family Medicine

## 2021-07-11 VITALS — BP 146/84 | HR 79 | Ht 64.0 in | Wt 149.1 lb

## 2021-07-11 DIAGNOSIS — I10 Essential (primary) hypertension: Secondary | ICD-10-CM

## 2021-07-11 DIAGNOSIS — M810 Age-related osteoporosis without current pathological fracture: Secondary | ICD-10-CM | POA: Insufficient documentation

## 2021-07-11 DIAGNOSIS — E7849 Other hyperlipidemia: Secondary | ICD-10-CM | POA: Diagnosis not present

## 2021-07-11 DIAGNOSIS — E038 Other specified hypothyroidism: Secondary | ICD-10-CM

## 2021-07-11 DIAGNOSIS — Z1211 Encounter for screening for malignant neoplasm of colon: Secondary | ICD-10-CM

## 2021-07-11 DIAGNOSIS — M300 Polyarteritis nodosa: Secondary | ICD-10-CM

## 2021-07-11 DIAGNOSIS — E063 Autoimmune thyroiditis: Secondary | ICD-10-CM

## 2021-07-11 MED ORDER — AMLODIPINE BESYLATE 10 MG PO TABS: 10.0000 mg | ORAL_TABLET | Freq: Every day | ORAL | 3 refills | Status: DC

## 2021-07-11 NOTE — Assessment & Plan Note (Signed)
Managed by endo, corrected but thinks med is negatively affecting her, will discuss with Endo

## 2021-07-11 NOTE — Assessment & Plan Note (Signed)
rept dexa needed, encouraged regular weight bearing exercise , also calcium and d supplement daily

## 2021-07-11 NOTE — Assessment & Plan Note (Signed)
Hyperlipidemia:Low fat diet discussed and encouraged.   Lipid Panel  Lab Results  Component Value Date   CHOL 129 04/22/2021   HDL 38 (L) 04/22/2021   LDLCALC 75 04/22/2021   TRIG 82 04/22/2021   CHOLHDL 3.4 04/22/2021   Updated lab needed at/ before next visit. Needs to increase exercise

## 2021-07-11 NOTE — Progress Notes (Signed)
   Faith Hood     MRN: [id number]      DOB: [birth year]   HPI Faith Hood is here for follow up and re-evaluation of chronic medical conditions, medication management and review of any available recent lab and radiology data.  Preventive health is updated, specifically  Cancer screening and Immunization.   Treated 3 weeks ago.for acute bronchitis, cough much improved actually now resolved, ED visit reviewed with patient The PT thinks thyroid medication doe not make her feel well There are no new concerns.  There are no specific complaints   ROS Denies recent fever or chills. Denies sinus pressure, nasal congestion, ear pain or sore throat. Denies chest congestion, productive cough or wheezing. Denies chest pains, palpitations and leg swelling Denies abdominal pain, nausea, vomiting,diarrhea or constipation.   Denies dysuria, frequency, hesitancy or incontinence. Denies joint pain, swelling and limitation in mobility. Denies headaches, seizures, numbness, or tingling. Denies depression, anxiety or insomnia. Denies skin break down or rash.   PE  BP (!) 146/84   Pulse 79   Ht '5\' 4"'$  (1.626 m)   Wt 149 lb 1.9 oz (67.6 kg)   SpO2 98%   BMI 25.60 kg/m   Patient alert and oriented and in no cardiopulmonary distress.  HEENT: No facial asymmetry, EOMI,     Neck supple .  Chest: Clear to auscultation bilaterally.  CVS: S1, S2 no murmurs, no S3.Regular rate.  ABD: Soft non tender.   Ext: No edema  MS: Adequate ROM spine, shoulders, hips and knees.  Skin: Intact, no ulcerations or rash noted.  Psych: Good eye contact, normal affect. Memory intact not anxious or depressed appearing.  CNS: CN 2-12 intact, power,  normal throughout.no focal deficits noted.   Assessment & Plan Hypertension Uncontrolled, increase amlodipine to 10 mg daily DASH diet and commitment to daily physical activity for a minimum of 30 minutes discussed and encouraged, as a part of hypertension  management. The importance of attaining a healthy weight is also discussed.     07/11/2021    9:06 AM 07/11/2021    8:45 AM 06/20/2021    7:36 PM 05/15/2021    1:03 PM 04/25/2021    4:31 PM 04/25/2021    3:54 PM 03/17/2021    1:08 PM  BP/Weight  Systolic BP 034 742 595 638 756 433 295  Diastolic BP 84 80 83 77 88 83 83  Wt. (Lbs)  149.12 149 147  147.8 146.6  BMI  25.6 kg/m2 26.39 kg/m2 25.23 kg/m2  25.37 kg/m2 25.16 kg/m2       Hyperlipidemia Hyperlipidemia:Low fat diet discussed and encouraged.   Lipid Panel  Lab Results  Component Value Date   CHOL 129 04/22/2021   HDL 38 (L) 04/22/2021   LDLCALC 75 04/22/2021   TRIG 82 04/22/2021   CHOLHDL 3.4 04/22/2021   Updated lab needed at/ before next visit. 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

## 2021-07-11 NOTE — Assessment & Plan Note (Signed)
No steroids in 06/2021, needs to continue rheumatology follow up and management

## 2021-07-11 NOTE — Patient Instructions (Addendum)
Annual with pap end August, call if you need me sooner, flu vaccine at visit  Nurse please arrange cologuard test  Fasting lipid, cmp and eGFR 5 days before August visit  Higher dose of amlodipine starting today is 10 mg one daily, new script at pharmacy   Need to take calcium daily and start 30 minutes 5 days per week   It is important that you exercise regularly at least 30 minutes 5 times a week. If you develop chest pain, have severe difficulty breathing, or feel very tired, stop exercising immediately and seek medical attention    Thanks for choosing [facility], we consider it a privelige to serve you.  HAPPY BIRTHDAY!

## 2021-07-11 NOTE — Assessment & Plan Note (Signed)
Uncontrolled, increase amlodipine to 10 mg daily DASH diet and commitment to daily physical activity for a minimum of 30 minutes discussed and encouraged, as a part of hypertension management. The importance of attaining a healthy weight is also discussed.     07/11/2021    9:06 AM 07/11/2021    8:45 AM 06/20/2021    7:36 PM 05/15/2021    1:03 PM 04/25/2021    4:31 PM 04/25/2021    3:54 PM 03/17/2021    1:08 PM  BP/Weight  Systolic BP 355 974 163 845 364 680 321  Diastolic BP 84 80 83 77 88 83 83  Wt. (Lbs)  149.12 149 147  147.8 146.6  BMI  25.6 kg/m2 26.39 kg/m2 25.23 kg/m2  25.37 kg/m2 25.16 kg/m2

## 2021-08-05 LAB — COLOGUARD: COLOGUARD: NEGATIVE

## 2021-08-07 ENCOUNTER — Telehealth: Payer: Self-pay | Admitting: Family Medicine

## 2021-08-07 NOTE — Telephone Encounter (Signed)
Pt called for results of home cologuard. Please call her at [phone].

## 2021-08-07 NOTE — Telephone Encounter (Signed)
Called pt voicemail not set up. If she calls back I sent the result to her mychart

## 2021-08-25 NOTE — Progress Notes (Unsigned)
Referring Provider: Fayrene Helper, MD Primary Care Physician:  Fayrene Helper, MD Primary GI Physician: Dr. Gala Romney  No chief complaint on file.   HPI:   Faith Hood is a 66 y.o. female presenting today for follow-up of elevated LFTs.  She has history of elevated LFTs since 2018.  Evaluation thus far has included negative acute hepatitis panel, ANA negative, AMA negative, ASMA elevated at 24, IgG elevated at 1591, ceruloplasmin within normal limits, no evidence of alpha 1 antitrypsin deficiency.  Iron panel with ferritin 430H, but sats normal at 18.  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 Celiac screen negative. 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 MRI liver protocol January 2021 with normal appearing liver.  LFTs increased mildly in March 2022 in the setting of significantly elevated TSH, found to be related to Hashimoto's thyroiditis.  She was started on levothyroxine and LFTs returned to normal in July 2022.  Overall, suspect LFT elevation multifactorial in setting of polyarteritis nodosa (though liver involvement is less common), hypothyroidism/Hashimoto's thyroiditis, and can't rule out underlying component of autoimmune hepatitis with mildly elevated ASMA and IgG).   Last seen in our office 02/26/2021.  Clinically, she is doing very well.  She had no signs or symptoms of decompensated liver disease and no significant GI symptoms.  Her prior rash related to polyarteritis nodosa had resolved.  She was not sure if she had started hep A/B vaccine series.  Recommended repeating HFP in 3 months, reach out to PCP to determine if hepatitis A/B vaccine series has been completed, and follow-up in 6 months.  HFP/13/23 entirely normal.  Today:    Past Medical History:  Diagnosis Date   Cerebral vascular disease     Carotid ultrasound in 10/2011: Mild plaque; tortuous vessels; no definite luminal obstruction.   Elevated LFTs    with positive ASMA and mildly elevated IgG, no biopsy pursued as LFTs normalized.   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  Repeat in 2030.   DIAGNOSTIC LAPAROSCOPY  1978   Gynecologic problems    Current Outpatient Medications  Medication Sig Dispense Refill   albuterol (VENTOLIN HFA) 108 (90 Base) MCG/ACT inhaler Inhale 2 puffs into the lungs every 4 (four) hours as needed for wheezing or shortness of breath. 1 each 3   alendronate (FOSAMAX) 70 MG tablet Take 1 tablet (70 mg total) by mouth every 7 (seven) days. Take with a full glass of water on an empty stomach. 4 tablet 11   amLODipine (NORVASC) 10 MG tablet Take 1 tablet (10 mg total) by mouth daily. 30 tablet 3   atorvastatin (LIPITOR) 20 MG tablet Take 1 tablet (20 mg) by mouth once daily 90 tablet 3   Calcium Carbonate-Vitamin D (CALCIUM 500 + D PO) Take by mouth 2 (two) times daily.     levothyroxine (SYNTHROID) 88 MCG tablet Take 1 tablet (88 mcg total) by mouth daily before breakfast. 90 tablet  1   losartan-hydrochlorothiazide (HYZAAR) 100-25 MG tablet Take 1 tablet by mouth daily. 90 tablet 1   No current facility-administered medications for this visit.    Allergies as of 08/27/2021   (No Known Allergies)    Family History  Problem Relation Age of Onset   Pancreatic cancer Mother 37       Diagnosed in 2009; currently in hospice pt. htn   Hypertension Mother        + Sister x4   Cancer Father 32       brain tumor   Hypertension Sister    Hypertension  Sister    Hypertension Sister    Stroke Sister    Hypertension Sister    Cancer Maternal Grandmother    Cancer Paternal Grandmother    Colon cancer Neg Hx     Social History   Socioeconomic History   Marital status: Single    Spouse name: Not on file   Number of children: 0   Years of education: Not on file   Highest education level: Not on file  Occupational History   Occupation: Middle school teacher retired    Comment: X25 years  Tobacco Use   Smoking status: Never   Smokeless tobacco: Never  Vaping Use   Vaping Use: Never used  Substance and Sexual Activity   Alcohol use: Not Currently    Comment: occasionally. About twice a year.    Drug use: No   Sexual activity: Not Currently  Other Topics Concern   Not on file  Social History Narrative   Lives with sister occasionally   Right Handed   Drinks caffeine occassionally   Social Determinants of Health   Financial Resource Strain: Not on file  Food Insecurity: Not on file  Transportation Needs: Not on file  Physical Activity: Not on file  Stress: Not on file  Social Connections: Not on file    Review of Systems: Gen: Denies fever, chills, cold or flu like symptoms, pre-syncope, or syncope.  CV: Denies chest pain, palpitations. Resp: Denies dyspnea at rest, cough. GI: See HPI Derm: Denies rash. Heme: See HPI  Physical Exam: There were no vitals taken for this visit. General:   Alert and oriented. No distress noted. Pleasant and cooperative.  Head:  Normocephalic and atraumatic. Eyes:  Conjuctiva clear without scleral icterus. Heart:  S1, S2 present without murmurs appreciated. Lungs:  Clear to auscultation bilaterally. No wheezes, rales, or rhonchi. No distress.  Abdomen:  +BS, soft, non-tender and non-distended. No rebound or guarding. No HSM or masses noted. Msk:  Symmetrical without gross deformities. Normal posture. Extremities:  Without edema. Neurologic:  Alert and  oriented x4 Psych:  Normal  mood and affect.    Assessment:     Plan:  ***   Aliene Altes, PA-C [facility] 08/27/2021

## 2021-08-27 ENCOUNTER — Ambulatory Visit (INDEPENDENT_AMBULATORY_CARE_PROVIDER_SITE_OTHER): Payer: BC Managed Care – PPO | Admitting: Gastroenterology

## 2021-08-27 ENCOUNTER — Encounter: Payer: Self-pay | Admitting: Gastroenterology

## 2021-08-27 ENCOUNTER — Other Ambulatory Visit: Payer: Self-pay | Admitting: Family Medicine

## 2021-08-27 VITALS — BP 153/83 | HR 83 | Temp 97.7°F | Ht 63.0 in | Wt 150.8 lb

## 2021-08-27 DIAGNOSIS — R7989 Other specified abnormal findings of blood chemistry: Secondary | ICD-10-CM | POA: Diagnosis not present

## 2021-08-27 DIAGNOSIS — L819 Disorder of pigmentation, unspecified: Secondary | ICD-10-CM | POA: Diagnosis not present

## 2021-08-27 NOTE — Patient Instructions (Signed)
Please have labs completed at [facility].  If your liver enzymes remain normal, we will transition to checking them every 6 months.   Call your dermatologist to follow-up on the white spots on your extremities and abdomen.   It was great to see you again today!    We will plan to see you back in 6 months. Do not hesitate to call if you have any questions or concerns prior to your next visit.  Aliene Altes, PA-C [facility]

## 2021-08-28 ENCOUNTER — Telehealth: Payer: Self-pay | Admitting: Gastroenterology

## 2021-08-28 NOTE — Telephone Encounter (Signed)
Courtney, we need to add on an iron panel with ferritin to labs ordered at yesterday's visit. Case was previously discussed with Dr. Gala Romney who recommended repeating an iron/ferritin level due to elevation previously. Please let patient know of additional labs needed and place orders.   Dx for iron panel- Elevated LFTs, elevated ferritin.

## 2021-08-29 ENCOUNTER — Other Ambulatory Visit: Payer: Self-pay | Admitting: *Deleted

## 2021-08-29 DIAGNOSIS — R7989 Other specified abnormal findings of blood chemistry: Secondary | ICD-10-CM

## 2021-08-29 NOTE — Addendum Note (Signed)
Addended by: Inda Castle on: 08/29/2021 09:23 AM   Modules accepted: Orders

## 2021-08-29 NOTE — Telephone Encounter (Signed)
Informed pt to have labs completed and that an iron panel was added. Pt voiced understanding.

## 2021-09-15 ENCOUNTER — Other Ambulatory Visit (HOSPITAL_COMMUNITY)
Admission: RE | Admit: 2021-09-15 | Discharge: 2021-09-15 | Disposition: A | Discharge status: Home / Self Care | Payer: BC Managed Care – PPO | Source: Ambulatory Visit | Attending: Gastroenterology | Admitting: Gastroenterology

## 2021-09-15 ENCOUNTER — Ambulatory Visit: Payer: BC Managed Care – PPO | Admitting: Nurse Practitioner

## 2021-09-15 DIAGNOSIS — R7989 Other specified abnormal findings of blood chemistry: Secondary | ICD-10-CM | POA: Insufficient documentation

## 2021-09-15 DIAGNOSIS — I10 Essential (primary) hypertension: Secondary | ICD-10-CM | POA: Diagnosis not present

## 2021-09-15 DIAGNOSIS — E7849 Other hyperlipidemia: Secondary | ICD-10-CM | POA: Insufficient documentation

## 2021-09-15 LAB — HEPATIC FUNCTION PANEL
ALT: 12 U/L (ref 0–44)
AST: 19 U/L (ref 15–41)
Albumin: 4 g/dL (ref 3.5–5.0)
Alkaline Phosphatase: 60 U/L (ref 38–126)
Bilirubin, Direct: <0.1 mg/dL (ref 0.0–0.2)
Total Bilirubin: 0.7 mg/dL (ref 0.3–1.2)
Total Protein: 7.7 g/dL (ref 6.5–8.1)

## 2021-09-15 LAB — IRON AND TIBC
Iron: 40 ug/dL (ref 28–170)
Saturation Ratios: 13 % (ref 10.4–31.8)
TIBC: 314 ug/dL (ref 250–450)
UIBC: 274 ug/dL

## 2021-09-15 LAB — FERRITIN: Ferritin: 127 ng/mL (ref 11–307)

## 2021-09-16 LAB — T4, FREE: Free T4: 0.97 ng/dL (ref 0.82–1.77)

## 2021-09-16 LAB — TSH: TSH: 7.28 u[IU]/mL — ABNORMAL HIGH (ref 0.450–4.500)

## 2021-09-25 ENCOUNTER — Encounter: Payer: Self-pay | Admitting: Nurse Practitioner

## 2021-09-25 ENCOUNTER — Ambulatory Visit: Payer: BC Managed Care – PPO | Admitting: Nurse Practitioner

## 2021-09-25 VITALS — BP 150/83 | HR 80 | Ht 63.0 in | Wt 144.0 lb

## 2021-09-25 DIAGNOSIS — E038 Other specified hypothyroidism: Secondary | ICD-10-CM | POA: Diagnosis not present

## 2021-09-25 DIAGNOSIS — E063 Autoimmune thyroiditis: Secondary | ICD-10-CM

## 2021-09-25 DIAGNOSIS — E042 Nontoxic multinodular goiter: Secondary | ICD-10-CM | POA: Diagnosis not present

## 2021-09-25 DIAGNOSIS — I10 Essential (primary) hypertension: Secondary | ICD-10-CM

## 2021-09-25 MED ORDER — LEVOTHYROXINE SODIUM 88 MCG PO TABS
88.0000 ug | ORAL_TABLET | Freq: Every day | ORAL | 1 refills | Status: DC
Start: 2021-09-25 — End: 2022-04-17

## 2021-09-25 NOTE — Patient Instructions (Signed)

## 2021-09-25 NOTE — Progress Notes (Signed)
Endocrinology Follow Up Note                                         09/25/2021, 11:43 AM  Subjective:   Subjective    Faith Hood is a 66 y.o.-year-old female patient being seen in follow up after being seen in consultation for hypothyroidism referred by Fayrene Helper, MD.   Past Medical History:  Diagnosis Date   Cerebral vascular disease    Carotid ultrasound in 10/2011: Mild plaque; tortuous vessels; no definite luminal obstruction.   Elevated LFTs    with positive ASMA and mildly elevated IgG, no biopsy pursued as LFTs normalized.   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  Repeat in 2030.   DIAGNOSTIC LAPAROSCOPY  1978   Gynecologic problems    Social History   Socioeconomic History   Marital status: Single    Spouse name: Not on file   Number of children: 0   Years of education: Not on file   Highest education level: Not on file  Occupational History   Occupation: Middle school teacher retired    Comment: X25 years  Tobacco Use   Smoking status: Never   Smokeless tobacco: Never  Vaping Use   Vaping Use: Never used  Substance and Sexual Activity   Alcohol use: Not Currently    Comment: occasionally. About twice a year.    Drug use: No   Sexual activity: Not Currently  Other Topics Concern   Not on file  Social History Narrative   Lives with sister occasionally   Right Handed   Drinks caffeine occassionally   Social Determinants of Health   Financial Resource Strain: Not on file  Food Insecurity: Not on file  Transportation Needs:  Not on file  Physical Activity: Not on file  Stress: Not on file  Social Connections: Not on file    Family History  Problem Relation Age of Onset   Pancreatic cancer Mother 37       Diagnosed in 2009; currently in hospice pt. htn   Hypertension Mother        + Sister x4   Cancer Father 73       brain tumor   Hypertension Sister    Hypertension Sister    Hypertension Sister    Stroke Sister    Hypertension Sister    Cancer Maternal Grandmother    Cancer Paternal Grandmother    Colon cancer Neg Hx     Outpatient Encounter Medications as of 09/25/2021  Medication Sig   alendronate (FOSAMAX) 70 MG tablet Take 1  tablet (70 mg total) by mouth every 7 (seven) days. Take with a full glass of water on an empty stomach.   amLODipine (NORVASC) 10 MG tablet Take 1 tablet (10 mg total) by mouth daily.   atorvastatin (LIPITOR) 20 MG tablet Take 1 tablet (20 mg) by mouth once daily   Calcium Carbonate-Vitamin D (CALCIUM 500 + D PO) Take by mouth 2 (two) times daily.   levothyroxine (SYNTHROID) 88 MCG tablet Take 1 tablet (88 mcg total) by mouth daily before breakfast.   losartan-hydrochlorothiazide (HYZAAR) 100-25 MG tablet Take 1 tablet by mouth once daily   [DISCONTINUED] albuterol (VENTOLIN HFA) 108 (90 Base) MCG/ACT inhaler Inhale 2 puffs into the lungs every 4 (four) hours as needed for wheezing or shortness of breath. (Patient not taking: Reported on 08/27/2021)   [DISCONTINUED] levothyroxine (SYNTHROID) 88 MCG tablet Take 1 tablet (88 mcg total) by mouth daily before breakfast.   No facility-administered encounter medications on file as of 09/25/2021.    ALLERGIES: No Known Allergies VACCINATION STATUS: Immunization History  Administered Date(s) Administered   Influenza Split 12/29/2010, 11/10/2011   Influenza Whole 01/24/2009, 12/17/2009   Influenza,inj,Quad PF,6+ Mos 12/14/2012, 01/31/2014, 01/09/2015, 02/04/2016, 11/23/2016, 12/06/2017, 12/12/2018, 12/26/2019    Influenza-Unspecified 01/09/2021   Janssen (J&J) SARS-COV-2 Vaccination 11/28/2019   Moderna Sars-Covid-2 Vaccination 11/02/2019, 11/30/2019, 04/28/2020, 02/19/2021   PPD Test 12/14/2012, 12/19/2012   Td 12/17/2009   Tdap 05/08/2020   Zoster Recombinat (Shingrix) 02/03/2018, 06/27/2018     HPI   Faith Hood is a patient with the above medical history. she was diagnosed with hypothyroidism at approximate age of 28 years (newly diagnosed in March 2022), which required subsequent initiation of thyroid hormone supplementation. she was given various doses of Levothyroxine since, currently on 88 micrograms. she reports compliance to this medication:  Taking it daily on empty stomach  with water, separated by >30 minutes before breakfast and other medications , and by at least 4 hours from calcium, iron, PPIs, multivitamins.  I reviewed patient's thyroid tests:  Lab Results  Component Value Date   TSH 7.280 (H) 09/15/2021   TSH 1.640 05/12/2021   TSH 5.260 (H) 03/13/2021   TSH 2.929 12/04/2020   TSH 6.100 (H) 11/13/2020   TSH 5.550 (H) 08/27/2020   TSH 4.440 06/10/2020   TSH 42.500 (H) 05/01/2020   TSH 1.38 11/28/2019   TSH 7.20 (H) 06/23/2018   FREET4 0.97 09/15/2021   FREET4 1.22 05/12/2021   FREET4 0.86 03/13/2021   FREET4 0.83 11/13/2020   FREET4 0.93 08/27/2020   FREET4 1.04 06/10/2020   FREET4 0.9 06/23/2018   FREET4 0.67 (L) 03/31/2014   She also had thyroid ultrasound in 08/2020 which showed multiple small nodules bilaterally, none of which met criteria for dedicated follow up or biopsy.  Pt denies feeling nodules in neck, hoarseness, dysphagia/odynophagia, SOB with lying down.  she does family history of thyroid disorders in her sister (unsure of which but knows it was surgically removed).  She does report her sister was unable to tolerate the generic form of the hormone and has done well with branded Synthroid.  No family history of thyroid cancer.  No history of radiation  therapy to head or neck.  No recent use of iodine supplements.  Denies use of Biotin containing supplements.  I reviewed her chart and she also has a history of HTN, HLD, transaminitis.   Review of systems  Constitutional: + Minimally fluctuating body weight,  current Body mass index is 25.51 kg/m. , +  fatigue-improving , no subjective hyperthermia, no subjective hypothermia Eyes: no blurry vision, no xerophthalmia ENT: no sore throat, no nodules palpated in throat, no dysphagia/odynophagia, no hoarseness Cardiovascular: no chest pain, no shortness of breath, no palpitations, no leg swelling Respiratory: no cough, no shortness of breath Gastrointestinal: no nausea/vomiting/diarrhea Musculoskeletal: + generalized muscle/joint aches- just finished round of prednisone Skin: no rashes, no hyperemia Neurological: no tremors, no numbness, no tingling, no dizziness Psychiatric: no depression, no anxiety   Objective:   Objective     BP (!) 150/83   Pulse 80   Ht '5\' 3"'  (1.6 m)   Wt 144 lb (65.3 kg)   BMI 25.51 kg/m  Wt Readings from Last 3 Encounters:  09/25/21 144 lb (65.3 kg)  08/27/21 150 lb 12.8 oz (68.4 kg)  07/11/21 149 lb 1.9 oz (67.6 kg)    BP Readings from Last 3 Encounters:  09/25/21 (!) 150/83  08/27/21 (!) 153/83  07/11/21 (!) 146/84      Physical Exam- Limited  Constitutional:  Body mass index is 25.51 kg/m. , not in acute distress, normal state of mind Eyes:  EOMI, no exophthalmos Neck: Supple Cardiovascular: RRR, no murmurs, rubs, or gallops, no edema Respiratory: Adequate breathing efforts, no crackles, rales, rhonchi, or wheezing Musculoskeletal: no gross deformities, strength intact in all four extremities, no gross restriction of joint movements Skin:  no rashes, no hyperemia Neurological: no tremor with outstretched hands   CMP ( most recent) CMP     Component Value Date/Time   NA 141 04/22/2021 0940   K 3.9 04/22/2021 0940   CL 103 04/22/2021  0940   CO2 26 04/22/2021 0940   GLUCOSE 90 04/22/2021 0940   GLUCOSE 89 12/04/2020 1428   BUN 13 04/22/2021 0940   CREATININE 0.87 04/22/2021 0940   CREATININE 0.79 11/28/2019 1449   CALCIUM 9.4 04/22/2021 0940   PROT 7.7 09/15/2021 1158   PROT 7.0 04/22/2021 0940   ALBUMIN 4.0 09/15/2021 1158   ALBUMIN 4.2 04/22/2021 0940   AST 19 09/15/2021 1158   ALT 12 09/15/2021 1158   ALKPHOS 60 09/15/2021 1158   BILITOT 0.7 09/15/2021 1158   BILITOT 0.4 04/22/2021 0940   GFRNONAA >60 12/04/2020 1428   GFRNONAA 80 11/28/2019 1449   GFRAA 93 11/28/2019 1449     Diabetic Labs (most recent): Lab Results  Component Value Date   HGBA1C 5.5 06/23/2018   HGBA1C 5.4 05/15/2016   HGBA1C 5.4 02/04/2016     Lipid Panel ( most recent) Lipid Panel     Component Value Date/Time   CHOL 129 04/22/2021 0940   TRIG 82 04/22/2021 0940   HDL 38 (L) 04/22/2021 0940   CHOLHDL 3.4 04/22/2021 0940   CHOLHDL 3.5 12/04/2020 1427   VLDL 13 12/04/2020 1427   LDLCALC 75 04/22/2021 0940   LDLCALC 143 (H) 09/14/2019 1340   LABVLDL 16 04/22/2021 0940       Lab Results  Component Value Date   TSH 7.280 (H) 09/15/2021   TSH 1.640 05/12/2021   TSH 5.260 (H) 03/13/2021   TSH 2.929 12/04/2020   TSH 6.100 (H) 11/13/2020   TSH 5.550 (H) 08/27/2020   TSH 4.440 06/10/2020   TSH 42.500 (H) 05/01/2020   TSH 1.38 11/28/2019   TSH 7.20 (H) 06/23/2018   FREET4 0.97 09/15/2021   FREET4 1.22 05/12/2021   FREET4 0.86 03/13/2021   FREET4 0.83 11/13/2020   FREET4 0.93 08/27/2020   FREET4 1.04 06/10/2020   FREET4 0.9 06/23/2018  FREET4 0.67 (L) 03/31/2014    Thyroid US from 09/06/20 CLINICAL DATA:  Hypothyroidism   EXAM: THYROID ULTRASOUND   TECHNIQUE: Ultrasound examination of the thyroid gland and adjacent soft tissues was performed.   COMPARISON:  07/19/2018 and previous back to 04/09/2014   FINDINGS: Parenchymal Echotexture: Moderately heterogenous   Isthmus: 1.4 cm thickness, previously  1.2   Right lobe: 6.1 x 1.2 x 2.3 cm, previously 4.7 x 2.4 x 2.7   Left lobe: 5.9 x 3 x 2.6 cm, previously 5 x 2.5 x 2.3   _________________________________________________________   Estimated total number of nodules >/= 1 cm: 3   Number of spongiform nodules >/=  2 cm not described below (TR1): 0   Number of mixed cystic and solid nodules >/= 1.5 cm not described below ([place]): 0   _________________________________________________________   Nodule # 1:   Prior biopsy: No   Location: Right; superior   Maximum size: 1.6 cm; Other 2 dimensions: 1.1 x 1.4 cm, previously, 1.4 x 1.2 x 1.4 cm   Composition: solid/almost completely solid (2)   Echogenicity: hyperechoic (1)   Shape: not taller-than-wide (0)   Margins: smooth (0)   Echogenic foci: none (0)   ACR TI-RADS total points: 3.   ACR TI-RADS risk category:  TR3 (3 points).   Significant change in size (>/= 20% in two dimensions and minimal increase of 2 mm): No   Change in features: No   Change in ACR TI-RADS risk category: No   ACR TI-RADS recommendations:   *Given size (>/= 1.5 - 2.4 cm) and appearance, a follow-up ultrasound in 1 year should be considered based on TI-RADS criteria.   _________________________________________________________   Nodule # 2: 0.6 cm calcification, mid left, stable; This nodule does NOT meet TI-RADS criteria for biopsy or dedicated follow-up.   _________________________________________________________   Nodule # 3:   Location: Left; superior   Maximum size: 1.2 cm; Other 2 dimensions: 0.8 x 0.9 cm   Composition: solid/almost completely solid (2)   Echogenicity: hyperechoic (1)   Shape: not taller-than-wide (0)   Margins: smooth (0)   Echogenic foci: none (0)   ACR TI-RADS total points: 3.   ACR TI-RADS risk category: TR3 (3 points).   ACR TI-RADS recommendations:   Given size (<1.4 cm) and appearance, this nodule does NOT meet TI-RADS criteria for biopsy or  dedicated follow-up.   _________________________________________________________   Nodule # 4:   Location: Isthmus; right of midline   Maximum size: 1.2 cm; Other 2 dimensions: 1 x 0.8 cm   Composition: solid/almost completely solid (2)   Echogenicity: isoechoic (1)   Shape: not taller-than-wide (0)   Margins: ill-defined (0)   Echogenic foci: none (0)   ACR TI-RADS total points: 3.   ACR TI-RADS risk category: TR3 (3 points).   ACR TI-RADS recommendations:   Given size (<1.4 cm) and appearance, this nodule does NOT meet TI-RADS criteria for biopsy or dedicated follow-up.   IMPRESSION: 1. Enlarged heterogenous thyroid with nodules as above. None meet criteria for biopsy. 2. Recommend annual/biennial ultrasound follow-up of right nodule as above, until stability x5 years confirmed.   The above is in keeping with the ACR TI-RADS recommendations - J Am Coll Radiol 2017;14:587-595.     Electronically Signed   By: Lucrezia Europe M.D.   On: 09/07/2020 08:13 --------------------------------------------------------------------------------------------------------- Thyroid US from 06/02/21 CLINICAL DATA:  Goiter.   EXAM: THYROID ULTRASOUND   TECHNIQUE: Ultrasound examination of the thyroid gland and adjacent soft tissues  was performed.   COMPARISON:  09/06/2020, 07/19/2018   FINDINGS: Parenchymal Echotexture: Moderately heterogenous   Isthmus: 1.5 cm, previously 1.4 cm   Right lobe: 5.1 x 2.8 x 2.3 cm, previously 6.1 x 1.2 x 2.3 cm   Left lobe: 4.6 x 2.5 x 2.7 cm, previously 5.9 x 3.0 x 2.6 cm   _________________________________________________________   Estimated total number of nodules >/= 1 cm: 3   Number of spongiform nodules >/=  2 cm not described below (TR1): 0   Number of mixed cystic and solid nodules >/= 1.5 cm not described below ([place]): 0   _________________________________________________________   Nodule # 1:   Prior biopsy: No   Location:  Isthmus; Inferior   Maximum size: 1.4 cm; Other 2 dimensions: 1.0 x 0.9 cm, previously, 1.2 x 1.0 x 0.8 cm   Composition: solid/almost completely solid (2)   Echogenicity: hyperechoic (1)   Shape: not taller-than-wide (0)   Margins: ill-defined (0)   Echogenic foci: none (0)   ACR TI-RADS total points: 3.   ACR TI-RADS risk category:  TR3 (3 points).   Significant change in size (>/= 20% in two dimensions and minimal increase of 2 mm): No   Change in features: No   Change in ACR TI-RADS risk category: No   ACR TI-RADS recommendations:   Given size (<1.5 cm) and appearance, this nodule does NOT meet TI-RADS criteria for biopsy or dedicated follow-up.   _________________________________________________________   Nodule # 2 (previously labeled 1):   Prior biopsy: No   Location: Right; Superior   Maximum size: 1.5 cm; Other 2 dimensions: 1.3 x 1.2 cm, previously, 1.6 x 1.1 x 1.4 cm   Composition: solid/almost completely solid (2)   Echogenicity: hyperechoic (1)   Shape: not taller-than-wide (0)   Margins: smooth (0)   Echogenic foci: none (0)   ACR TI-RADS total points: 3.   ACR TI-RADS risk category:  TR3 (3 points).   Significant change in size (>/= 20% in two dimensions and minimal increase of 2 mm): No   Change in features: No   Change in ACR TI-RADS risk category: No   ACR TI-RADS recommendations:   *Given size (>/= 1.5 - 2.4 cm) and appearance, a follow-up ultrasound in 1 year should be considered based on TI-RADS criteria.   _________________________________________________________   Nodule # 3:   Prior biopsy: No   Location: Left; Superior   Maximum size: 1.0 cm; Other 2 dimensions: 0.9 x 0.8 cm, previously, 1.2 x 0.8 x 0.9 cm   Composition: solid/almost completely solid (2)   Echogenicity: hyperechoic (1)   Shape: not taller-than-wide (0)   Margins: smooth (0)   Echogenic foci: none (0)   ACR TI-RADS total points: 3.    ACR TI-RADS risk category:  TR3 (3 points).   Significant change in size (>/= 20% in two dimensions and minimal increase of 2 mm): No   Change in features: No   Change in ACR TI-RADS risk category: No   ACR TI-RADS recommendations:   Given size (<1.4 cm) and appearance, this nodule does NOT meet TI-RADS criteria for biopsy or dedicated follow-up.   _________________________________________________________   IMPRESSION: 1. Similar appearing enlarged, heterogenous thyroid gland. 2. Similar appearance of previously visualized right superior solid thyroid nodule (labeled 2, 1.5 cm, previously 1.6 cm) which again meets criteria (TI-RADS category 3) for 1 year ultrasound surveillance. This study marks 3 years stability. 3. The remaining visualized thyroid nodules appear benign and do not warrant additional  follow-up.   The above is in keeping with the ACR TI-RADS recommendations - J Am Coll Radiol 2017;14:587-595.   Ruthann Cancer, MD   [facility]   [facility]     Electronically Signed   By: Ruthann Cancer M.D.   On: 06/02/2021 14:28   Latest Reference Range & Units 11/13/20 14:05 12/04/20 14:27 03/13/21 16:03 05/12/21 15:52 09/15/21 11:25  TSH 0.450 - 4.500 uIU/mL 6.100 (H) 2.929 5.260 (H) 1.640 7.280 (H)  Triiodothyronine,Free,Serum 2.0 - 4.4 pg/mL 2.9      T4,Free(Direct) 0.82 - 1.77 ng/dL 0.83  0.86 1.22 0.97  Thyroperoxidase Ab SerPl-aCnc 0 - 34 IU/mL >600 (H)      Thyroglobulin Antibody 0.0 - 0.9 IU/mL 1,435.2 (H)      (H): Data is abnormally high Assessment & Plan:   ASSESSMENT / PLAN:  1. Hypothyroidism- r/t Hashimoto's thyroiditis   Patient with relatively new hypothyroidism, on levothyroxine therapy. Her antibody testing confirms suspicion of autoimmune thyroid dysfunction.    Her previsit thyroid function tests are consistent with under-replacement however she does report she has missed several doses. She is  advised to continue Levothyroxine 88 mcg po daily before breakfast.   - We discussed about correct intake of levothyroxine, at fasting, with water, separated by at least 30 minutes from breakfast, and separated by more than 4 hours from calcium, iron, multivitamins, acid reflux medications (PPIs). -Patient is made aware of the fact that thyroid hormone replacement is needed for life, dose to be adjusted by periodic monitoring of thyroid function tests.  2. Multinodular thyroid Multiple thyroid nodules recommending follow up with surveillance ultrasound in 1 year (around 05/2022).    3. Hypertension I advised her to reach out to her PCP regarding high BP readings, despite recent medication change.  I also encouraged patient to monitor BP at home and give her reading to her PCP.    I spent 31 minutes in the care of the patient today including review of labs from Thyroid Function, CMP, and other relevant labs ; imaging/biopsy records (current and previous including abstractions from other facilities); face-to-face time discussing  her lab results and symptoms, medications doses, her options of short and long term treatment based on the latest standards of care / guidelines;   and documenting the encounter.  Brehanna P Seney  participated in the discussions, expressed understanding, and voiced agreement with the above plans.  All questions were answered to her satisfaction. she is encouraged to contact clinic should she have any questions or concerns prior to her return visit.   FOLLOW UP PLAN:  Return in about 6 months (around 03/28/2022) for Thyroid follow up, Previsit labs.  Rayetta Pigg, [facility] [address] [place], [place] [address] Phone: [phone] Fax: [phone]  09/25/2021, 11:43 AM

## 2021-10-04 ENCOUNTER — Other Ambulatory Visit: Payer: Self-pay | Admitting: Family Medicine

## 2021-10-14 ENCOUNTER — Other Ambulatory Visit (HOSPITAL_COMMUNITY): Payer: Self-pay | Admitting: Family Medicine

## 2021-10-14 ENCOUNTER — Encounter: Payer: Self-pay | Admitting: Family Medicine

## 2021-10-14 ENCOUNTER — Ambulatory Visit (INDEPENDENT_AMBULATORY_CARE_PROVIDER_SITE_OTHER): Payer: BC Managed Care – PPO | Admitting: Family Medicine

## 2021-10-14 ENCOUNTER — Other Ambulatory Visit (HOSPITAL_COMMUNITY)
Admission: RE | Admit: 2021-10-14 | Discharge: 2021-10-14 | Disposition: A | Discharge status: Home / Self Care | Payer: BC Managed Care – PPO | Source: Ambulatory Visit | Attending: Family Medicine | Admitting: Family Medicine

## 2021-10-14 VITALS — BP 134/72 | HR 68 | Ht 63.0 in | Wt 140.0 lb

## 2021-10-14 DIAGNOSIS — M300 Polyarteritis nodosa: Secondary | ICD-10-CM

## 2021-10-14 DIAGNOSIS — Z124 Encounter for screening for malignant neoplasm of cervix: Secondary | ICD-10-CM | POA: Insufficient documentation

## 2021-10-14 DIAGNOSIS — Z23 Encounter for immunization: Secondary | ICD-10-CM

## 2021-10-14 DIAGNOSIS — Z1231 Encounter for screening mammogram for malignant neoplasm of breast: Secondary | ICD-10-CM

## 2021-10-14 DIAGNOSIS — I10 Essential (primary) hypertension: Secondary | ICD-10-CM

## 2021-10-14 DIAGNOSIS — Z0000 Encounter for general adult medical examination without abnormal findings: Secondary | ICD-10-CM

## 2021-10-14 DIAGNOSIS — H547 Unspecified visual loss: Secondary | ICD-10-CM

## 2021-10-14 DIAGNOSIS — E7849 Other hyperlipidemia: Secondary | ICD-10-CM

## 2021-10-14 DIAGNOSIS — E559 Vitamin D deficiency, unspecified: Secondary | ICD-10-CM

## 2021-10-14 DIAGNOSIS — E038 Other specified hypothyroidism: Secondary | ICD-10-CM

## 2021-10-14 DIAGNOSIS — E063 Autoimmune thyroiditis: Secondary | ICD-10-CM

## 2021-10-14 NOTE — Patient Instructions (Addendum)
F/U in 6 months, call if you need me sooner  Flu vaccine today  Fasting lipid, cmp and EGFr, cCC and Vit D in next 1 week  Please schedule January mammogram at [facility] at checkout  It is important that you exercise regularly at least 30 minutes 5 times a week. If you develop chest pain, have severe difficulty breathing, or feel very tired, stop exercising immediately and seek medical attention   You are referred for eye exam  You need f/u with rheumatology in December  It is important that you exercise regularly at least 30 minutes 5 times a week. If you develop chest pain, have severe difficulty breathing, or feel very tired, stop exercising immediately and seek medical attention   Thanks for choosing [facility], we consider it a privelige to serve you.

## 2021-10-14 NOTE — Progress Notes (Signed)
    Faith Hood     MRN: [id number]      DOB: 06-21-1955  HPI: Patient is in for annual physical exam. No other health concerns are expressed or addressed at the visit. Recent labs,  are reviewed. Immunization is reviewed , and  updated if needed.   PE: BP 134/72   Pulse 68   Ht '5\' 3"'$  (1.6 m)   Wt 140 lb (63.5 kg)   SpO2 97%   BMI 24.80 kg/m   Pleasant  female, alert and oriented x 3, in no cardio-pulmonary distress. Afebrile. HEENT No facial trauma or asymetry. Sinuses non tender.  Extra occullar muscles intact.. External ears normal, . Neck: supple, no adenopathy,JVD or thyromegaly.No bruits.  Chest: Clear to ascultation bilaterally.No crackles or wheezes. Non tender to palpation  Breast: No asymetry,no masses or lumps. No tenderness. No nipple discharge or inversion. No axillary or supraclavicular adenopathy  Cardiovascular system; Heart sounds normal,  S1 and  S2 ,no S3.  No murmur, or thrill. Apical beat not displaced Peripheral pulses normal.  Abdomen: Soft, non tender, no organomegaly or masses. No bruits. Bowel sounds normal. No guarding, tenderness or rebound.   GU: External genitalia normal female genitalia , normal female distribution of hair. No lesions. Urethral meatus normal in size, no  Prolapse, no lesions visibly  Present. Bladder non tender. Vagina pink and moist , with no visible lesions , discharge present . Adequate pelvic support no  cystocele or rectocele noted Cervix pink and appears healthy, no lesions or ulcerations noted, no discharge noted from os Uterus normal size, no adnexal masses, no cervical motion or adnexal tenderness.   Musculoskeletal exam: Full ROM of spine, hips , shoulders and knees. No deformity ,swelling or crepitus noted. No muscle wasting or atrophy.   Neurologic: Cranial nerves 2 to 12 intact. Power, tone ,sensation and reflexes normal throughout. No disturbance in gait. No tremor.  Skin: Intact, no  ulceration, erythema , scaling or rash noted. Pigmentation normal throughout  Psych; Normal mood and affect. Judgement and concentration normal   Assessment & Plan:  Annual physical exam Annual exam as documented. Counseling done  re healthy lifestyle involving commitment to 150 minutes exercise per week, heart healthy diet, and attaining healthy weight.The importance of adequate sleep also discussed. Regular seat belt use and home safety, is also discussed. Changes in health habits are decided on by the patient with goals and time frames  set for achieving them. Immunization and cancer screening needs are specifically addressed at this visit.   Reduced vision  For eye exam   Polyarteritis nodosa [facility]) Needs December follow up, will refer

## 2021-10-14 NOTE — Assessment & Plan Note (Signed)
Needs December follow up, will refer

## 2021-10-14 NOTE — Assessment & Plan Note (Signed)
For eye exam

## 2021-10-14 NOTE — Assessment & Plan Note (Signed)

## 2021-10-15 ENCOUNTER — Other Ambulatory Visit: Payer: Self-pay

## 2021-10-15 MED ORDER — AMLODIPINE BESYLATE 10 MG PO TABS: 10.0000 mg | ORAL_TABLET | Freq: Every day | ORAL | 3 refills | Status: DC

## 2021-10-17 ENCOUNTER — Other Ambulatory Visit: Payer: Self-pay

## 2021-10-17 LAB — CYTOLOGY - PAP
Comment: NEGATIVE
Diagnosis: NEGATIVE
High risk HPV: NEGATIVE

## 2021-10-22 LAB — CMP14+EGFR
ALT: 11 IU/L (ref 0–32)
AST: 17 IU/L (ref 0–40)
Albumin/Globulin Ratio: 1.3 (ref 1.2–2.2)
Albumin: 3.9 g/dL (ref 3.9–4.9)
Alkaline Phosphatase: 52 IU/L (ref 44–121)
BUN/Creatinine Ratio: 14 (ref 12–28)
BUN: 13 mg/dL (ref 8–27)
Bilirubin Total: 0.6 mg/dL (ref 0.0–1.2)
CO2: 22 mmol/L (ref 20–29)
Calcium: 8.7 mg/dL (ref 8.7–10.3)
Chloride: 97 mmol/L (ref 96–106)
Creatinine, Ser: 0.92 mg/dL (ref 0.57–1.00)
Globulin, Total: 2.9 g/dL (ref 1.5–4.5)
Glucose: 95 mg/dL (ref 70–99)
Potassium: 4 mmol/L (ref 3.5–5.2)
Sodium: 134 mmol/L (ref 134–144)
Total Protein: 6.8 g/dL (ref 6.0–8.5)
eGFR: 70 mL/min/{1.73_m2} (ref >59)

## 2021-10-22 LAB — CBC
Hematocrit: 33.7 % — ABNORMAL LOW (ref 34.0–46.6)
Hemoglobin: 11.3 g/dL (ref 11.1–15.9)
MCH: 28 pg (ref 26.6–33.0)
MCHC: 33.5 g/dL (ref 31.5–35.7)
MCV: 84 fL (ref 79–97)
Platelets: 197 10*3/uL (ref 150–450)
RBC: 4.03 x10E6/uL (ref 3.77–5.28)
RDW: 13.6 % (ref 11.7–15.4)
WBC: 6.5 10*3/uL (ref 3.4–10.8)

## 2021-10-22 LAB — VITAMIN D 25 HYDROXY (VIT D DEFICIENCY, FRACTURES): Vit D, 25-Hydroxy: 34.8 ng/mL (ref 30.0–100.0)

## 2021-10-22 LAB — LIPID PANEL
Chol/HDL Ratio: 5 ratio — ABNORMAL HIGH (ref 0.0–4.4)
Cholesterol, Total: 145 mg/dL (ref 100–199)
HDL: 29 mg/dL — ABNORMAL LOW (ref >39)
LDL Chol Calc (NIH): 100 mg/dL — ABNORMAL HIGH (ref 0–99)
Triglycerides: 85 mg/dL (ref 0–149)
VLDL Cholesterol Cal: 16 mg/dL (ref 5–40)

## 2021-10-23 ENCOUNTER — Telehealth: Payer: Self-pay

## 2021-10-23 NOTE — Telephone Encounter (Signed)
Called pt no option to LVM

## 2021-10-23 NOTE — Telephone Encounter (Signed)
Patient called to speak to nurse about lab results. Call # [phone]

## 2021-10-24 ENCOUNTER — Other Ambulatory Visit: Payer: Self-pay

## 2021-10-24 MED ORDER — ALENDRONATE SODIUM 70 MG PO TABS: ORAL_TABLET | ORAL | 0 refills | Status: DC

## 2021-10-24 NOTE — Telephone Encounter (Signed)
Pt aware of lab results 

## 2021-10-29 ENCOUNTER — Other Ambulatory Visit: Payer: Self-pay

## 2021-10-29 MED ORDER — ALENDRONATE SODIUM 70 MG PO TABS: ORAL_TABLET | ORAL | 0 refills | Status: DC

## 2021-11-11 ENCOUNTER — Ambulatory Visit: Payer: BC Managed Care – PPO | Admitting: Internal Medicine

## 2021-11-11 ENCOUNTER — Encounter: Payer: Self-pay | Admitting: Internal Medicine

## 2021-11-11 VITALS — BP 126/65 | HR 87 | Ht 63.0 in | Wt 135.0 lb

## 2021-11-11 DIAGNOSIS — M79672 Pain in left foot: Secondary | ICD-10-CM | POA: Diagnosis not present

## 2021-11-11 DIAGNOSIS — G603 Idiopathic progressive neuropathy: Secondary | ICD-10-CM | POA: Diagnosis not present

## 2021-11-11 MED ORDER — KETOROLAC TROMETHAMINE 60 MG/2ML IM SOLN
60.0000 mg | Freq: Once | INTRAMUSCULAR | Status: AC
  Administered 2021-11-11: 60 mg via INTRAMUSCULAR

## 2021-11-11 MED ORDER — INDOMETHACIN 50 MG PO CAPS: 50.0000 mg | ORAL_CAPSULE | Freq: Two times a day (BID) | ORAL | 0 refills | Status: DC

## 2021-11-11 MED ORDER — PREGABALIN 50 MG PO CAPS: 50.0000 mg | ORAL_CAPSULE | Freq: Two times a day (BID) | ORAL | 0 refills | Status: DC

## 2021-11-11 NOTE — Assessment & Plan Note (Signed)
Chronic numbness and tingling of the feet and hands likely due to peripheral neuropathy Did not tolerate gabapentin Started Lyrica instead

## 2021-11-11 NOTE — Progress Notes (Signed)
Acute Office Visit  Subjective:    Patient ID: Faith Hood, female    DOB: September 02, 1955, 66 y.o.   MRN: [id number]  Chief Complaint  Patient presents with   Foot Pain    Left foot pain for about 2weeks  feeling tingling in hands and light headed started for about 2years... left foot feeling numb but not as bad as the right.Marland Kitchen    HPI Patient is in today for complaint of acute on chronic left foot pain.  She has been having pain around the MTP joint of great toe of left thumb with mild swelling after a recent school trip for the last 2 weeks.  She denies any recent injury or fall.  She reports that she had pain on the sole near the base of the toes for the last few weeks.  She has chronic numbness of the feet, but denies any worsening of the pain with walking or other claudication symptoms.  Her DPA pulse was intact today.  She also reports numbness and tingling of the hands.  She has seen neurologist for it, and was placed on gabapentin for neuropathy.  She did not tolerate it as she was having fatigue and dizziness with it.  Past Medical History:  Diagnosis Date   Cerebral vascular disease    Carotid ultrasound in 10/2011: Mild plaque; tortuous vessels; no definite luminal obstruction.   Elevated LFTs    with positive ASMA and mildly elevated IgG, no biopsy pursued as LFTs normalized.   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  Repeat in 2030.   DIAGNOSTIC LAPAROSCOPY  1978   Gynecologic problems    Family History  Problem Relation Age of Onset   Pancreatic cancer Mother 46       Diagnosed in 2009; currently in hospice pt. htn    Hypertension Mother        + Sister x4   Cancer Father 58       brain tumor   Hypertension Sister    Hypertension Sister    Hypertension Sister    Stroke Sister    Hypertension Sister    Cancer Maternal Grandmother    Cancer Paternal Grandmother    Colon cancer Neg Hx     Social History   Socioeconomic History   Marital status: Single    Spouse name: Not on file   Number of children: 0   Years of education: Not on file   Highest education level: Not on file  Occupational History   Occupation: Middle school teacher retired    Comment: X25 years  Tobacco Use   Smoking status: Never   Smokeless tobacco: Never  Vaping Use   Vaping Use: Never used  Substance and Sexual Activity   Alcohol use: Not Currently    Comment: occasionally. About twice a year.    Drug use: No   Sexual activity: Not Currently  Other Topics Concern   Not on file  Social History Narrative   Lives with sister occasionally   Right Handed   Drinks caffeine occassionally   Social  Determinants of Health   Financial Resource Strain: Not on file  Food Insecurity: Not on file  Transportation Needs: Not on file  Physical Activity: Not on file  Stress: Not on file  Social Connections: Not on file  Intimate Partner Violence: Not on file    Outpatient Medications Prior to Visit  Medication Sig Dispense Refill   alendronate (FOSAMAX) 70 MG tablet TAKE 1 TABLET BY MOUTH ONCE A WEEK TAKE  WITH  A  FULL  GLASS  OF  WATER  ON  AN  EMPTY  STOMACH 4 tablet 0   amLODipine (NORVASC) 10 MG tablet Take 1 tablet (10 mg total) by mouth daily. 30 tablet 3   atorvastatin (LIPITOR) 20 MG tablet Take 1 tablet (20 mg) by mouth once daily 90 tablet 3   Calcium Carbonate-Vitamin D (CALCIUM 500 + D PO) Take by mouth 2 (two) times daily.     levothyroxine (SYNTHROID) 88 MCG tablet Take 1 tablet (88 mcg total) by mouth daily before breakfast. 90 tablet 1   losartan-hydrochlorothiazide (HYZAAR) 100-25 MG tablet Take 1 tablet  by mouth once daily 90 tablet 0   No facility-administered medications prior to visit.    No Known Allergies  Review of Systems  Constitutional:  Positive for fatigue. Negative for chills and fever.  HENT:  Negative for congestion, sinus pressure and sinus pain.   Respiratory:  Negative for cough and shortness of breath.   Cardiovascular:  Negative for chest pain and palpitations.  Gastrointestinal:  Negative for nausea and vomiting.  Musculoskeletal:  Positive for joint swelling.       B/l feet pain  Skin:  Negative for rash.  Neurological:  Positive for dizziness. Negative for weakness.  Psychiatric/Behavioral:  Negative for agitation and behavioral problems.        Objective:    Physical Exam Constitutional:      General: She is not in acute distress.    Appearance: She is not diaphoretic.  HENT:     Head: Normocephalic and atraumatic.  Eyes:     General: No scleral icterus.    Extraocular Movements: Extraocular movements intact.  Cardiovascular:     Rate and Rhythm: Normal rate and regular rhythm.     Heart sounds: Normal heart sounds. No murmur heard. Pulmonary:     Breath sounds: Normal breath sounds. No wheezing or rales.  Musculoskeletal:        General: Tenderness (Left first MTP joint, minimal swelling) present.     Right lower leg: No edema.     Left lower leg: No edema.  Skin:    General: Skin is warm.     Findings: No rash.  Neurological:     General: No focal deficit present.     Mental Status: She is alert and oriented to person, place, and time.  Psychiatric:        Mood and Affect: Mood normal.        Behavior: Behavior normal.     BP 126/65   Pulse 87   Ht '5\' 3"'$  (1.6 m)   Wt 135 lb (61.2 kg)   SpO2 99%   BMI 23.91 kg/m  Wt Readings from Last 3 Encounters:  11/11/21 135 lb (61.2 kg)  10/14/21 140 lb (63.5 kg)  09/25/21 144 lb (65.3 kg)        Assessment & Plan:   Problem List Items Addressed This Visit       Nervous and  Auditory   Idiopathic progressive neuropathy -  Primary    Chronic numbness and tingling of the feet and hands likely due to peripheral neuropathy Did not tolerate gabapentin Started Lyrica instead      Relevant Medications   pregabalin (LYRICA) 50 MG capsule     Other   Left foot pain    Near 1st MTP joint with minimal swelling Could be gout or arthritis related Toradol IM given Indomethacin PRN Check uric acid level after acute pain and swelling resolves      Relevant Medications   indomethacin (INDOCIN) 50 MG capsule   Other Relevant Orders   Uric acid     Meds ordered this encounter  Medications   indomethacin (INDOCIN) 50 MG capsule    Sig: Take 1 capsule (50 mg total) by mouth 2 (two) times daily with a meal.    Dispense:  30 capsule    Refill:  0   pregabalin (LYRICA) 50 MG capsule    Sig: Take 1 capsule (50 mg total) by mouth 2 (two) times daily.    Dispense:  60 capsule    Refill:  0   ketorolac (TORADOL) injection 60 mg     Lindell Spar, MD

## 2021-11-11 NOTE — Assessment & Plan Note (Signed)
Near 1st MTP joint with minimal swelling Could be gout or arthritis related Toradol IM given Indomethacin PRN Check uric acid level after acute pain and swelling resolves

## 2021-11-11 NOTE — Patient Instructions (Addendum)
Please take Indomethacin as needed for foot pain.  Please take Lyrica as prescribed for neuropathy.  Please get Uric acid testing after acute foot pain and swelling resolves to check for gout.

## 2021-11-21 LAB — URIC ACID: Uric Acid: 5.5 mg/dL (ref 3.0–7.2)

## 2021-11-24 ENCOUNTER — Other Ambulatory Visit: Payer: Self-pay | Admitting: Family Medicine

## 2021-11-24 ENCOUNTER — Telehealth: Payer: Self-pay

## 2021-11-24 NOTE — Telephone Encounter (Signed)
NA NVM to leave vm for pt to call the office back

## 2021-11-24 NOTE — Telephone Encounter (Signed)
Patient call about lab results.

## 2021-11-26 ENCOUNTER — Ambulatory Visit: Payer: BC Managed Care – PPO | Admitting: Podiatry

## 2021-11-26 ENCOUNTER — Encounter: Payer: Self-pay | Admitting: Internal Medicine

## 2021-11-26 ENCOUNTER — Ambulatory Visit (INDEPENDENT_AMBULATORY_CARE_PROVIDER_SITE_OTHER): Payer: BC Managed Care – PPO | Admitting: Internal Medicine

## 2021-11-26 VITALS — BP 128/82 | HR 73 | Resp 18 | Ht 63.0 in | Wt 139.2 lb

## 2021-11-26 DIAGNOSIS — G603 Idiopathic progressive neuropathy: Secondary | ICD-10-CM | POA: Diagnosis not present

## 2021-11-26 DIAGNOSIS — M79672 Pain in left foot: Secondary | ICD-10-CM

## 2021-11-26 DIAGNOSIS — M300 Polyarteritis nodosa: Secondary | ICD-10-CM

## 2021-11-26 MED ORDER — PREDNISONE 20 MG PO TABS: 40.0000 mg | ORAL_TABLET | Freq: Every day | ORAL | 0 refills | Status: DC

## 2021-11-26 MED ORDER — TRAMADOL HCL 50 MG PO TABS: 50.0000 mg | ORAL_TABLET | Freq: Three times a day (TID) | ORAL | 0 refills | Status: AC | PRN

## 2021-11-26 NOTE — Assessment & Plan Note (Signed)
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

## 2021-11-26 NOTE — Patient Instructions (Signed)
Please take Prednisone as prescribed.  Please take Tramadol as needed for severe pain.  You are being referred to Podiatry.

## 2021-11-26 NOTE — Progress Notes (Addendum)
Acute Office Visit  Subjective:    Patient ID: Faith Hood, female    DOB: [birth year], 66 y.o.   MRN: [id number]  Chief Complaint  Patient presents with   Follow-up    Left foot pain not getting any better has been going on since 11-10-21 pt states right foot starting to tingle so she is concerned     HPI Patient is in today for acute on chronic left foot pain, which is worsening since the last week.  She has also noticed blackish discoloration of the first 2 toes of the left foot since the last visit.  Her pain is sharp, nonradiating and is worse upon wearing shoes.  Of note, she has history of PAN, and was on oral steroids in the past.  Followed by Dr. Amil Amen.  Her DPA pulse was weak today.  In the last visit, she had been having pain around the MTP joint of great toe of left foot with mild swelling after a recent school trip for the 2 weeks.  She denied any recent injury or fall.  She reported that she had pain on the sole near the base of the toes for the last few weeks.  She has chronic numbness of the feet, but denies any worsening of the pain with walking or other claudication symptoms.   Past Medical History:  Diagnosis Date   Cerebral vascular disease    Carotid ultrasound in 10/2011: Mild plaque; tortuous vessels; no definite luminal obstruction.   Elevated LFTs    with positive ASMA and mildly elevated IgG, no biopsy pursued as LFTs normalized.   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  Repeat in 2030.   DIAGNOSTIC LAPAROSCOPY  1978   Gynecologic problems    Family History  Problem Relation Age of Onset   Pancreatic  cancer Mother 81       Diagnosed in 2009; currently in hospice pt. htn   Hypertension Mother        + Sister x4   Cancer Father 66       brain tumor   Hypertension Sister    Hypertension Sister    Hypertension Sister    Stroke Sister    Hypertension Sister    Cancer Maternal Grandmother    Cancer Paternal Grandmother    Colon cancer Neg Hx     Social History   Socioeconomic History   Marital status: Single    Spouse name: Not on file   Number of children: 0   Years of education: Not on file   Highest education level: Not on file  Occupational History   Occupation: Middle school teacher retired    Comment: X25 years  Tobacco Use   Smoking status: Never   Smokeless tobacco: Never  Vaping Use   Vaping Use: Never used  Substance and Sexual Activity   Alcohol use: Not Currently    Comment: occasionally. About twice a year.    Drug use: No   Sexual activity: Not Currently  Other Topics Concern   Not on file  Social History Narrative  Lives with sister occasionally   Right Handed   Drinks caffeine occassionally   Social Determinants of Health   Financial Resource Strain: Not on file  Food Insecurity: Not on file  Transportation Needs: Not on file  Physical Activity: Not on file  Stress: Not on file  Social Connections: Not on file  Intimate Partner Violence: Not on file    Outpatient Medications Prior to Visit  Medication Sig Dispense Refill   alendronate (FOSAMAX) 70 MG tablet TAKE 1 TABLET BY MOUTH ONCE A WEEK TAKE  WITH  A  FULL  GLASS  OF  WATER  ON  AN  EMPTY  STOMACH 4 tablet 0   amLODipine (NORVASC) 10 MG tablet Take 1 tablet (10 mg total) by mouth daily. 30 tablet 3   atorvastatin (LIPITOR) 20 MG tablet Take 1 tablet (20 mg) by mouth once daily 90 tablet 3   Calcium Carbonate-Vitamin D (CALCIUM 500 + D PO) Take by mouth 2 (two) times daily.     indomethacin (INDOCIN) 50 MG capsule Take 1 capsule (50 mg total) by mouth 2 (two) times daily with a meal. 30  capsule 0   levothyroxine (SYNTHROID) 88 MCG tablet Take 1 tablet (88 mcg total) by mouth daily before breakfast. 90 tablet 1   losartan-hydrochlorothiazide (HYZAAR) 100-25 MG tablet Take 1 tablet by mouth once daily 90 tablet 0   pregabalin (LYRICA) 50 MG capsule Take 1 capsule (50 mg total) by mouth 2 (two) times daily. 60 capsule 0   No facility-administered medications prior to visit.    No Known Allergies  Review of Systems  Constitutional:  Positive for fatigue. Negative for chills and fever.  HENT:  Negative for congestion, sinus pressure and sinus pain.   Respiratory:  Negative for cough and shortness of breath.   Cardiovascular:  Negative for chest pain and palpitations.  Gastrointestinal:  Negative for nausea and vomiting.  Musculoskeletal:  Positive for joint swelling.       B/l feet pain  Skin:  Positive for color change (First and second toes of left foot). Negative for rash.  Neurological:  Negative for dizziness and weakness.  Psychiatric/Behavioral:  Negative for agitation and behavioral problems.        Objective:    Physical Exam Constitutional:      General: She is not in acute distress.    Appearance: She is not diaphoretic.  HENT:     Head: Normocephalic and atraumatic.  Eyes:     General: No scleral icterus.    Extraocular Movements: Extraocular movements intact.  Cardiovascular:     Rate and Rhythm: Normal rate and regular rhythm.     Heart sounds: Normal heart sounds. No murmur heard.    Comments: DPA pulse 1+ on left side Pulmonary:     Breath sounds: Normal breath sounds. No wheezing or rales.  Musculoskeletal:        General: Tenderness (Left first MTP joint, minimal swelling) present.     Right lower leg: No edema.     Left lower leg: No edema.  Skin:    General: Skin is warm.     Findings: No rash.     Comments: Blackish discoloration of first 2 toes of left foot  Neurological:     General: No focal deficit present.     Mental Status:  She is alert and oriented to person, place, and time.  Psychiatric:        Mood and Affect: Mood normal.  Behavior: Behavior normal.     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[place])    Sees Dr Amil Amen Was on oral steroids in the past Her foot pain and toe discoloration can be due to flareup of PAN, started oral Prednisone X 5 days Advised to contact Dr Amil Amen      Relevant Medications   predniSONE (DELTASONE) 20 MG tablet     Nervous and Auditory   Idiopathic progressive neuropathy    Chronic numbness and tingling of the feet and hands likely due to peripheral neuropathy Did not tolerate gabapentin Started Lyrica instead        Other   Left foot pain - Primary    Left foot pain with blackish discoloration of the first 2 toes Tramadol PRN for severe pain Oral prednisone for likely PAN flareup 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    Dispense:  15 tablet    Refill:  0   predniSONE (DELTASONE) 20 MG tablet    Sig: Take 2 tablets (40 mg total) by mouth daily with breakfast.    Dispense:  10 tablet    Refill:  0      Chevella Pearce Keith Rake, MD

## 2021-11-26 NOTE — Patient Instructions (Signed)
[facility]  Phone: [phone]

## 2021-11-26 NOTE — Assessment & Plan Note (Signed)
Sees Dr Amil Amen Was on oral steroids in the past Her foot pain and toe discoloration can be due to flareup of PAN, started oral Prednisone X 5 days Advised to contact Dr Amil Amen

## 2021-11-26 NOTE — Assessment & Plan Note (Signed)
Chronic numbness and tingling of the feet and hands likely due to peripheral neuropathy Did not tolerate gabapentin Started Lyrica instead

## 2021-11-30 NOTE — Progress Notes (Signed)
  Subjective:  Patient ID: Faith Hood, female    DOB: Jan 30, 1956,  MRN: [id number]  Chief Complaint  Patient presents with   Foot Pain    New Patient - acute left foot pain - 1st and 2nd toe have a dark dermatitis that seems to be spreading - she first noticed it about 3 weeks ago. Has been diagnosed with PAN (polyarteritis nodosa)    66 y.o. female presents with the above complaint. History confirmed with patient.  She saw her PCP today who recommended prednisone oral  Objective:  Physical Exam: warm, good capillary refill, no trophic changes or ulcerative lesions, weakly palpable DP and PT pulses, normal sensory exam, and ecchymotic blistering on first and second toe left foot  Assessment:   1. 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[place])      Plan:  Patient was evaluated and treated and all questions answered.  Discussed with her this is likely secondary to her longstanding polyarteritis nodosa.  She has a prescription for prednisone that she will begin today.  Expect this will improve with this.  I did recommend ABIs and vascular surgery consultation  Return if symptoms worsen or fail to improve.

## 2021-12-16 ENCOUNTER — Other Ambulatory Visit: Payer: Self-pay

## 2021-12-16 MED ORDER — ALENDRONATE SODIUM 70 MG PO TABS: ORAL_TABLET | ORAL | 0 refills | Status: DC

## 2021-12-17 ENCOUNTER — Encounter: Payer: Self-pay | Admitting: Internal Medicine

## 2021-12-17 ENCOUNTER — Ambulatory Visit: Payer: BC Managed Care – PPO | Admitting: Internal Medicine

## 2021-12-17 VITALS — BP 130/82 | HR 78 | Resp 16 | Ht 64.0 in | Wt 138.4 lb

## 2021-12-17 DIAGNOSIS — G603 Idiopathic progressive neuropathy: Secondary | ICD-10-CM | POA: Diagnosis not present

## 2021-12-17 DIAGNOSIS — M79672 Pain in left foot: Secondary | ICD-10-CM | POA: Diagnosis not present

## 2021-12-17 DIAGNOSIS — L97521 Non-pressure chronic ulcer of other part of left foot limited to breakdown of skin: Secondary | ICD-10-CM | POA: Diagnosis not present

## 2021-12-17 DIAGNOSIS — M300 Polyarteritis nodosa: Secondary | ICD-10-CM | POA: Diagnosis not present

## 2021-12-17 MED ORDER — PREGABALIN 50 MG PO CAPS: 50.0000 mg | ORAL_CAPSULE | Freq: Two times a day (BID) | ORAL | 1 refills | Status: DC

## 2021-12-17 MED ORDER — PREDNISONE 20 MG PO TABS: ORAL_TABLET | ORAL | 0 refills | Status: DC

## 2021-12-17 NOTE — Assessment & Plan Note (Signed)
Left foot ulcers over first 2 toes Has history of PAN Awaiting vascular surgery evaluation Advised to start taking aspirin Prednisone for PAN

## 2021-12-17 NOTE — Patient Instructions (Addendum)
Please take Prednisone as prescribed.  Please start taking Aspirin 81 mg once daily.  Please follow up with Vascular surgeon as scheduled.

## 2021-12-17 NOTE — Assessment & Plan Note (Signed)
Left foot pain with blackish discoloration of the first 2 toes Oral prednisone for likely PAN flareup Considering her history of PAN, concern for vascular etiology, had Urgent referral to Podiatry first Parts of toes are discolored - unclear whether this is a cutaneous manifestation of PAN

## 2021-12-17 NOTE — Progress Notes (Signed)
Acute Office Visit  Subjective:    Patient ID: Faith Hood, female    DOB: April 15, 1955, 66 y.o.   MRN: [id number]  Chief Complaint  Patient presents with   Follow-up    3 week follow up left foot pain has been going on since 11-11-21 about the same sees vein and vascular this friday    HPI Patient is in today for follow-up of acute on chronic left foot pain, which is worsening since the last 3 weeks.  She has also noticed blackish discoloration of the first 2 toes of the left foot.  Her pain is sharp, nonradiating and is worse upon wearing shoes.  Of note, she has history of PAN, and was on oral steroids in the past.  Followed by Dr. Amil Amen.   Her DPA pulse was weak today.  She has been evaluated by podiatrist, and was advised to continue prednisone for PAN.  She now has open ulceration over first 2 toes of left foot.  She is planned to be evaluated by vascular surgeon after 2 days.  She has chronic numbness of the feet, but denies any worsening of the pain with walking or other claudication symptoms.  Past Medical History:  Diagnosis Date   Cerebral vascular disease    Carotid ultrasound in 10/2011: Mild plaque; tortuous vessels; no definite luminal obstruction.   Elevated LFTs    with positive ASMA and mildly elevated IgG, no biopsy pursued as LFTs normalized.   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  Repeat in 2030.   DIAGNOSTIC LAPAROSCOPY  1978   Gynecologic problems    Family History  Problem Relation Age of Onset   Pancreatic cancer Mother 63       Diagnosed in 2009; currently in hospice pt. htn   Hypertension Mother         + Sister x4   Cancer Father 36       brain tumor   Hypertension Sister    Hypertension Sister    Hypertension Sister    Stroke Sister    Hypertension Sister    Cancer Maternal Grandmother    Cancer Paternal Grandmother    Colon cancer Neg Hx     Social History   Socioeconomic History   Marital status: Single    Spouse name: Not on file   Number of children: 0   Years of education: Not on file   Highest education level: Not on file  Occupational History   Occupation: Middle school teacher retired    Comment: X25 years  Tobacco Use   Smoking status: Never   Smokeless tobacco: Never  Vaping Use   Vaping Use: Never used  Substance and Sexual Activity   Alcohol use: Not Currently    Comment: occasionally. About twice a year.    Drug use: No   Sexual activity: Not Currently  Other Topics Concern   Not on file  Social History Narrative   Lives with sister occasionally   Right Handed   Drinks caffeine occassionally   Social Determinants of Health   Financial  Resource Strain: Not on file  Food Insecurity: Not on file  Transportation Needs: Not on file  Physical Activity: Not on file  Stress: Not on file  Social Connections: Not on file  Intimate Partner Violence: Not on file    Outpatient Medications Prior to Visit  Medication Sig Dispense Refill   alendronate (FOSAMAX) 70 MG tablet TAKE 1 TABLET BY MOUTH ONCE A WEEK TAKE  WITH  A  FULL  GLASS  OF  WATER  ON  AN  EMPTY  STOMACH 4 tablet 0   amLODipine (NORVASC) 10 MG tablet Take 1 tablet (10 mg total) by mouth daily. 30 tablet 3   atorvastatin (LIPITOR) 20 MG tablet Take 1 tablet (20 mg) by mouth once daily 90 tablet 3   Calcium Carbonate-Vitamin D (CALCIUM 500 + D PO) Take by mouth 2 (two) times daily.     indomethacin (INDOCIN) 50 MG capsule Take 1 capsule (50 mg total) by mouth 2 (two) times daily with a meal. 30 capsule 0   levothyroxine (SYNTHROID) 88 MCG tablet Take 1 tablet (88 mcg total) by mouth daily  before breakfast. 90 tablet 1   losartan-hydrochlorothiazide (HYZAAR) 100-25 MG tablet Take 1 tablet by mouth once daily 90 tablet 0   predniSONE (DELTASONE) 20 MG tablet Take 2 tablets (40 mg total) by mouth daily with breakfast. (Patient not taking: Reported on 12/17/2021) 10 tablet 0   pregabalin (LYRICA) 50 MG capsule Take 1 capsule (50 mg total) by mouth 2 (two) times daily. (Patient not taking: Reported on 12/17/2021) 60 capsule 0   No facility-administered medications prior to visit.    No Known Allergies  Review of Systems  Constitutional:  Positive for fatigue. Negative for chills and fever.  HENT:  Negative for congestion, sinus pressure and sinus pain.   Respiratory:  Negative for cough and shortness of breath.   Cardiovascular:  Negative for chest pain and palpitations.  Gastrointestinal:  Negative for nausea and vomiting.  Musculoskeletal:  Positive for joint swelling.       B/l feet pain  Skin:  Positive for color change (First and second toes of left foot). Negative for rash.  Neurological:  Negative for dizziness and weakness.  Psychiatric/Behavioral:  Negative for agitation and behavioral problems.        Objective:    Physical Exam Constitutional:      General: She is not in acute distress.    Appearance: She is not diaphoretic.  HENT:     Head: Normocephalic and atraumatic.  Eyes:     General: No scleral icterus.    Extraocular Movements: Extraocular movements intact.  Cardiovascular:     Rate and Rhythm: Normal rate and regular rhythm.     Heart sounds: Normal heart sounds. No murmur heard.    Comments: DPA pulse 1+ on left side Pulmonary:     Breath sounds: Normal breath sounds. No wheezing or rales.  Musculoskeletal:        General: Tenderness (Left first MTP joint, minimal swelling) present.     Right lower leg: No edema.     Left lower leg: No edema.  Skin:    General: Skin is warm.     Findings: No rash.     Comments: Blackish discoloration of  first 2 toes of left foot with ulceration of toes  Neurological:     General: No focal deficit present.     Mental Status: She is alert and oriented to person, place, and time.  Psychiatric:  Mood and Affect: Mood normal.        Behavior: Behavior normal.     BP 130/82 (BP Location: Left Arm, Patient Position: Sitting, Cuff Size: Normal)   Pulse 78   Resp 16   Ht '5\' 4"'$  (1.626 m)   Wt 138 lb 6.4 oz (62.8 kg)   SpO2 98%   BMI 23.76 kg/m  Wt Readings from Last 3 Encounters:  12/17/21 138 lb 6.4 oz (62.8 kg)  11/26/21 139 lb 3.2 oz (63.1 kg)  11/11/21 135 lb (61.2 kg)        Assessment & Plan:   Problem List Items Addressed This Visit       Cardiovascular and Mediastinum   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HCC)    Sees Dr Amil Amen Was on oral steroids in the past Her foot pain and toe discoloration can be due to flareup of PAN, started oral Prednisone X 15 days again Advised to contact Dr Amil Amen      Relevant Medications   predniSONE (DELTASONE) 20 MG tablet     Nervous and Auditory   Idiopathic progressive neuropathy    Chronic numbness and tingling of the feet and hands likely due to peripheral neuropathy Did not tolerate gabapentin Started Lyrica instead, had slight improvement, refilled today      Relevant Medications   pregabalin (LYRICA) 50 MG capsule     Musculoskeletal and Integument   Skin ulcer of toe of left foot, limited to breakdown of skin ([place]) - Primary    Left foot ulcers over first 2 toes Has history of PAN Awaiting vascular surgery evaluation Advised to start taking aspirin Prednisone for PAN        Other   Left foot pain    Left foot pain with blackish discoloration of the first 2 toes Oral prednisone for likely PAN flareup Considering her history of PAN, concern for vascular etiology, had Urgent referral to Podiatry first Parts of toes are discolored - unclear whether this is a cutaneous manifestation of PAN        Meds ordered this  encounter  Medications   predniSONE (DELTASONE) 20 MG tablet    Sig: Take 3 tablets (60 mg total) by mouth daily with breakfast for 5 days, THEN 2 tablets (40 mg total) daily with breakfast for 5 days, THEN 1 tablet (20 mg total) daily with breakfast for 5 days.    Dispense:  30 tablet    Refill:  0   pregabalin (LYRICA) 50 MG capsule    Sig: Take 1 capsule (50 mg total) by mouth 2 (two) times daily.    Dispense:  60 capsule    Refill:  1     Addi Pak Keith Rake, MD

## 2021-12-17 NOTE — Assessment & Plan Note (Signed)
Chronic numbness and tingling of the feet and hands likely due to peripheral neuropathy Did not tolerate gabapentin Started Lyrica instead, had slight improvement, refilled today

## 2021-12-17 NOTE — Assessment & Plan Note (Signed)
Sees Dr Amil Amen Was on oral steroids in the past Her foot pain and toe discoloration can be due to flareup of PAN, started oral Prednisone X 15 days again Advised to contact Dr Amil Amen

## 2021-12-19 ENCOUNTER — Encounter (INDEPENDENT_AMBULATORY_CARE_PROVIDER_SITE_OTHER): Payer: Self-pay | Admitting: Vascular Surgery

## 2021-12-19 ENCOUNTER — Ambulatory Visit (INDEPENDENT_AMBULATORY_CARE_PROVIDER_SITE_OTHER): Payer: BC Managed Care – PPO

## 2021-12-19 ENCOUNTER — Ambulatory Visit (INDEPENDENT_AMBULATORY_CARE_PROVIDER_SITE_OTHER): Payer: BC Managed Care – PPO | Admitting: Vascular Surgery

## 2021-12-19 VITALS — BP 164/73 | HR 58 | Resp 18 | Ht 64.0 in | Wt 136.0 lb

## 2021-12-19 DIAGNOSIS — I10 Essential (primary) hypertension: Secondary | ICD-10-CM | POA: Diagnosis not present

## 2021-12-19 DIAGNOSIS — M79605 Pain in left leg: Secondary | ICD-10-CM | POA: Diagnosis not present

## 2021-12-19 DIAGNOSIS — M300 Polyarteritis nodosa: Secondary | ICD-10-CM

## 2021-12-19 DIAGNOSIS — E7849 Other hyperlipidemia: Secondary | ICD-10-CM | POA: Diagnosis not present

## 2021-12-19 DIAGNOSIS — M79609 Pain in unspecified limb: Secondary | ICD-10-CM | POA: Insufficient documentation

## 2021-12-19 NOTE — Assessment & Plan Note (Signed)
blood pressure control important in reducing the progression of atherosclerotic disease. On appropriate oral medications.  

## 2021-12-19 NOTE — Assessment & Plan Note (Signed)
We evaluated her arterial perfusion today with noninvasive studies which showed normal ABIs of 1.1 on the right and 0.99 on the left with multiphasic waveforms and normal digital pressures consistent with no arterial insufficiency.  Sounds like she has fairly significant autoimmune and rheumatologic issues going on but no arterial insufficiency which is encouraging.  No further vascular work-up is planned at this time.  Return to clinic as needed.

## 2021-12-19 NOTE — Assessment & Plan Note (Signed)
lipid control important in reducing the progression of atherosclerotic disease. Continue statin therapy  

## 2021-12-19 NOTE — Progress Notes (Signed)
Patient ID: Faith Hood, female   DOB: 14-May-1955, 66 y.o.   MRN: [id number]  Chief Complaint  Patient presents with   Establish Care    Referred by [facility]    HPI Faith Hood is a 66 y.o. female.  I am asked to see the patient by Dr. Sherryle Lis for evaluation of left foot and ankle pain and swelling.  This has gotten better since the initiation of steroids.  It actually blistered up and was very sore and painful.  The swelling has largely gone down.  No previous history of ulceration or infection.  No fevers or chills.  No chest pain or shortness of breath.  We evaluated her arterial perfusion today with noninvasive studies which showed normal ABIs of 1.1 on the right and 0.99 on the left with multiphasic waveforms and normal digital pressures consistent with no arterial insufficiency.     Past Medical History:  Diagnosis Date   Cerebral vascular disease    Carotid ultrasound in 10/2011: Mild plaque; tortuous vessels; no definite luminal obstruction.   Elevated LFTs    with positive ASMA and mildly elevated IgG, no biopsy pursued as LFTs normalized.   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  Repeat in 2030.   DIAGNOSTIC LAPAROSCOPY  1978   Gynecologic problems     Family History  Problem Relation Age of Onset   Pancreatic cancer Mother 21       Diagnosed in 2009; currently in hospice pt. htn   Hypertension Mother        + Sister x4   Cancer Father 84       brain tumor   Hypertension Sister    Hypertension Sister    Hypertension Sister    Stroke Sister    Hypertension Sister    Cancer Maternal Grandmother    Cancer  Paternal Grandmother    Colon cancer Neg Hx       Social History   Tobacco Use   Smoking status: Never   Smokeless tobacco: Never  Vaping Use   Vaping Use: Never used  Substance Use Topics   Alcohol use: Not Currently    Comment: occasionally. About twice a year.    Drug use: No     No Known Allergies  Current Outpatient Medications  Medication Sig Dispense Refill   alendronate (FOSAMAX) 70 MG tablet TAKE 1 TABLET BY MOUTH ONCE A WEEK TAKE  WITH  A  FULL  GLASS  OF  WATER  ON  AN  EMPTY  STOMACH 4 tablet 0   amLODipine (NORVASC) 10 MG tablet Take 1 tablet (10 mg total) by mouth daily. 30 tablet 3   aspirin EC 81 MG tablet Take 81 mg by mouth daily. Swallow whole.     atorvastatin (LIPITOR) 20 MG tablet Take 1 tablet (20 mg) by mouth once daily 90 tablet 3   levothyroxine (SYNTHROID) 88 MCG tablet Take 1 tablet (88 mcg total) by mouth daily before breakfast. 90 tablet 1   losartan-hydrochlorothiazide (HYZAAR) 100-25 MG tablet Take 1 tablet by mouth once  daily 90 tablet 0   predniSONE (DELTASONE) 20 MG tablet Take 3 tablets (60 mg total) by mouth daily with breakfast for 5 days, THEN 2 tablets (40 mg total) daily with breakfast for 5 days, THEN 1 tablet (20 mg total) daily with breakfast for 5 days. 30 tablet 0   pregabalin (LYRICA) 50 MG capsule Take 1 capsule (50 mg total) by mouth 2 (two) times daily. 60 capsule 1   No current facility-administered medications for this visit.      REVIEW OF SYSTEMS (Negative unless checked)  Constitutional: _0 Weight loss  _1 Fever  _2 Chills Cardiac: _3 Chest pain   _4 Chest pressure   _5 Palpitations   _6 Shortness of breath when laying flat   _7 Shortness of breath at rest   _8 Shortness of breath with exertion. Vascular:  _9 Pain in legs with walking   _10 Pain in legs at rest   _11 Pain in legs when laying flat   _12 Claudication   _13 Pain in feet when walking  _14 Pain in feet at rest  _15 Pain in feet when laying flat   _16 History of DVT   _17 Phlebitis    _18 Swelling in legs   _19 Varicose veins   _20 Non-healing ulcers Pulmonary:   _21 Uses home oxygen   _22 Productive cough   _23 Hemoptysis   _24 Wheeze  _25 COPD   _26 Asthma Neurologic:  _27 Dizziness  _28 Blackouts   _29 Seizures   _30 History of stroke   _31 History of TIA  _32 Aphasia   _33 Temporary blindness   _34 Dysphagia   _35 Weakness or numbness in arms   _36 Weakness or numbness in legs Musculoskeletal:  _37 Arthritis   _38 Joint swelling   _39 Joint pain   _40 Low back pain Hematologic:  _41 Easy bruising  _42 Easy bleeding   _43 Hypercoagulable state   _44 Anemic  _45 Hepatitis Gastrointestinal:  _46 Blood in stool   _47 Vomiting blood  _48 Gastroesophageal reflux/heartburn   _49 Abdominal pain Genitourinary:  _50 Chronic kidney disease   _51 Difficult urination  _52 Frequent urination  _53 Burning with urination   _54 Hematuria Skin:  _55 Rashes   _56 Ulcers   _57 Wounds Psychological:  _58 History of anxiety   _59  History of major depression.    Physical Exam BP (!) 164/73 (BP Location: Right Arm)   Pulse (!) 58   Resp 18   Ht _60  (1.626 m)   Wt 136 lb (61.7 kg)   BMI 23.34 kg/m  Gen:  WD/WN, NAD Head: [place]/AT, No temporalis wasting. Ear/Nose/Throat: Hearing grossly intact, nares w/o erythema or drainage, oropharynx w/o Erythema/Exudate Eyes: Conjunctiva clear, sclera non-icteric  Neck: trachea midline.  No JVD.  Pulmonary:  Good air movement, respirations not labored, no use of accessory muscles  Cardiac: RRR, no JVD Vascular:  Vessel Right Left  Radial Palpable Palpable                          DP 2+ 2+  PT 2+ 1+   Gastrointestinal:. No masses, surgical incisions, or scars. Musculoskeletal: M/S 5/5 throughout.  Extremities without ischemic changes.  No deformity or atrophy. Mild left foot and ankle edema. Neurologic: Sensation grossly intact in extremities.  Symmetrical.  Speech is fluent. Motor exam as listed above. Psychiatric: Judgment intact, Mood & affect appropriate for pt's clinical situation. Dermatologic: No rashes or ulcers  noted.  No cellulitis or open wounds.    Radiology No results found.  Labs Recent Results (from the past 2160 hour(s))  Cytology - PAP     Status: None   Collection Time: 10/14/21  9:40 AM  Result Value Ref Range   High risk HPV Negative  Adequacy      Satisfactory for evaluation. The presence or absence of an   Adequacy      endocervical/transformation zone component cannot be determined because   Adequacy of atrophy.    Diagnosis      - Negative for intraepithelial lesion or malignancy (NILM)   Comment Normal Reference Range HPV - Negative   Lipid panel     Status: Abnormal   Collection Time: 10/21/21  8:09 AM  Result Value Ref Range   Cholesterol, Total 145 100 - 199 mg/dL   Triglycerides 85 0 - 149 mg/dL   HDL 29 (L) >39 mg/dL   VLDL Cholesterol Cal 16 5 - 40 mg/dL   LDL Chol Calc (NIH) 100 (H) 0 - 99 mg/dL   Chol/HDL Ratio 5.0 (H) 0.0 - 4.4 ratio    Comment:                                   T. Chol/HDL Ratio                                             Men  Women                               1/2 Avg.Risk  3.4    3.3                                   Avg.Risk  5.0    4.4                                2X Avg.Risk  9.6    7.1                                3X Avg.Risk 23.4   11.0   CMP14+EGFR     Status: None   Collection Time: 10/21/21  8:09 AM  Result Value Ref Range   Glucose 95 70 - 99 mg/dL   BUN 13 8 - 27 mg/dL   Creatinine, Ser 0.92 0.57 - 1.00 mg/dL   eGFR 70 >59 mL/min/1.73   BUN/Creatinine Ratio 14 12 - 28   Sodium 134 134 - 144 mmol/L   Potassium 4.0 3.5 - 5.2 mmol/L   Chloride 97 96 - 106 mmol/L   CO2 22 20 - 29 mmol/L   Calcium 8.7 8.7 - 10.3 mg/dL   Total Protein 6.8 6.0 - 8.5 g/dL   Albumin 3.9 3.9 - 4.9 g/dL   Globulin, Total 2.9 1.5 - 4.5 g/dL   Albumin/Globulin Ratio 1.3 1.2 - 2.2   Bilirubin Total 0.6 0.0 - 1.2 mg/dL   Alkaline Phosphatase 52 44 - 121 IU/L   AST 17 0 - 40 IU/L   ALT 11 0 - 32 IU/L  CBC     Status: Abnormal    Collection Time: 10/21/21  8:09 AM  Result Value Ref Range   WBC 6.5 3.4 - 10.8 x10E3/uL   RBC 4.03 3.77 - 5.28 x10E6/uL   Hemoglobin 11.3 11.1 - 15.9 g/dL  Hematocrit 33.7 (L) 34.0 - 46.6 %   MCV 84 79 - 97 fL   MCH 28.0 26.6 - 33.0 pg   MCHC 33.5 31.5 - 35.7 g/dL   RDW 13.6 11.7 - 15.4 %   Platelets 197 150 - 450 x10E3/uL  VITAMIN D 25 Hydroxy (Vit-D Deficiency, Fractures)     Status: None   Collection Time: 10/21/21  8:09 AM  Result Value Ref Range   Vit D, 25-Hydroxy 34.8 30.0 - 100.0 ng/mL    Comment: Vitamin D deficiency has been defined by the [place] and an Endocrine Society practice guideline as a level of serum 25-OH vitamin D less than 20 ng/mL (1,2). The Endocrine Society went on to further define vitamin D insufficiency as a level between 21 and 29 ng/mL (2). 1. IOM (Institute of Medicine). 2010. Dietary reference    intakes for calcium and D. [place]: The    [employer]. 2. Holick MF, Binkley Crane, Bischoff-Ferrari HA, et al.    Evaluation, treatment, and prevention of vitamin D    deficiency: an Endocrine Society clinical practice    guideline. JCEM. 2011 Jul; 96(7):1911-30.   Uric acid     Status: None   Collection Time: 11/20/21 10:24 AM  Result Value Ref Range   Uric Acid 5.5 3.0 - 7.2 mg/dL    Comment:            Therapeutic target for gout patients: <6.0    Assessment/Plan:  Hyperlipidemia lipid control important in reducing the progression of atherosclerotic disease. Continue statin therapy   Hypertension blood pressure control important in reducing the progression of atherosclerotic disease. On appropriate oral medications.   Pain in limb We evaluated her arterial perfusion today with noninvasive studies which showed normal ABIs of 1.1 on the right and 0.99 on the left with multiphasic waveforms and normal digital pressures consistent with no arterial insufficiency.  Sounds like she has fairly significant autoimmune  and rheumatologic issues going on but no arterial insufficiency which is encouraging.  No further vascular work-up is planned at this time.  Return to clinic as needed.      Leotis Pain 12/19/2021, 11:22 AM   This note was created with Dragon medical transcription system.  Any errors from dictation are unintentional.

## 2021-12-22 ENCOUNTER — Encounter (INDEPENDENT_AMBULATORY_CARE_PROVIDER_SITE_OTHER): Payer: Self-pay

## 2022-01-01 ENCOUNTER — Encounter: Payer: Self-pay | Admitting: Family Medicine

## 2022-01-01 ENCOUNTER — Ambulatory Visit: Payer: BC Managed Care – PPO | Admitting: Family Medicine

## 2022-01-01 VITALS — BP 132/72 | HR 68 | Ht 63.0 in | Wt 142.0 lb

## 2022-01-01 DIAGNOSIS — E038 Other specified hypothyroidism: Secondary | ICD-10-CM | POA: Diagnosis not present

## 2022-01-01 DIAGNOSIS — R0989 Other specified symptoms and signs involving the circulatory and respiratory systems: Secondary | ICD-10-CM

## 2022-01-01 DIAGNOSIS — M300 Polyarteritis nodosa: Secondary | ICD-10-CM

## 2022-01-01 DIAGNOSIS — E063 Autoimmune thyroiditis: Secondary | ICD-10-CM

## 2022-01-01 DIAGNOSIS — L97521 Non-pressure chronic ulcer of other part of left foot limited to breakdown of skin: Secondary | ICD-10-CM | POA: Diagnosis not present

## 2022-01-01 DIAGNOSIS — I10 Essential (primary) hypertension: Secondary | ICD-10-CM | POA: Diagnosis not present

## 2022-01-01 NOTE — Patient Instructions (Signed)
F/U early February, call if you need me sooner  Keep appt with rheumatology please  No med changes  Fasting lipid, cmp and eGFR 5 days before Feb appointment   Thanks for choosing [facility], we consider it a privelige to serve you.

## 2022-01-04 ENCOUNTER — Encounter: Payer: Self-pay | Admitting: Family Medicine

## 2022-01-04 NOTE — Assessment & Plan Note (Signed)
Needs follow up as ulcer persisits, negative vascular work up

## 2022-01-04 NOTE — Assessment & Plan Note (Addendum)
Managed by Endo, needs to follow up

## 2022-01-04 NOTE — Assessment & Plan Note (Signed)
DASH diet and commitment to daily physical activity for a minimum of 30 minutes discussed and encouraged, as a part of hypertension management. The importance of attaining a healthy weight is also discussed.     01/01/2022    2:36 PM 01/01/2022    2:19 PM 12/19/2021   10:49 AM 12/17/2021    3:24 PM 11/26/2021    8:07 AM 11/11/2021    2:45 PM 10/14/2021    9:30 AM  BP/Weight  Systolic BP 433 295 188 416 606 301 601  Diastolic BP 72 69 73 82 82 65 72  Wt. (Lbs)  142 136 138.4 139.2 135   BMI  25.15 kg/m2 23.34 kg/m2 23.76 kg/m2 24.66 kg/m2 23.91 kg/m2      Controlled, no change in medication

## 2022-01-04 NOTE — Progress Notes (Signed)
   Faith Hood     MRN: [id number]      DOB: 1955-06-12   HPI Faith Hood is here for follow up and re-evaluation of chronic medical conditions, medication management and review of any available recent lab and radiology data.  Preventive health is updated, specifically  Cancer screening and Immunization.   Needs to schedule appt with rheumatology for follow up, foot less swollen , however still healing with skin partially open ROS Denies recent fever or chills. Denies sinus pressure, nasal congestion, ear pain or sore throat. Denies chest congestion, productive cough or wheezing. Denies chest pains, palpitations and leg swelling Denies abdominal pain, nausea, vomiting,diarrhea or constipation.   Denies dysuria, frequency, hesitancy or incontinence. Denies joint pain, swelling and limitation in mobility. Denies headaches, seizures, numbness, or tingling. Denies depression, anxiety or insomnia.  PE  BP 132/72 (BP Location: Right Arm, Patient Position: Sitting, Cuff Size: Normal)   Pulse 68   Ht '5\' 3"'$  (1.6 m)   Wt 142 lb (64.4 kg)   SpO2 98%   BMI 25.15 kg/m   Patient alert and oriented and in no cardiopulmonary distress.  HEENT: No facial asymmetry, EOMI,     Neck supple .  Chest: Clear to auscultation bilaterally.  CVS: S1, S2 no murmurs, no S3.Regular rate.  ABD: Soft non tender.   Ext: No edema  MS: Adequate ROM spine, shoulders, hips and knees.  Skin: Intact, no ulcerations or rash noted.  Psych: Good eye contact, normal affect. Memory intact not anxious or depressed appearing.  CNS: CN 2-12 intact, power,  normal throughout.no focal deficits noted.   Assessment & Plan  Hypertension DASH diet and commitment to daily physical activity for a minimum of 30 minutes discussed and encouraged, as a part of hypertension management. The importance of attaining a healthy weight is also discussed.     01/01/2022    2:36 PM 01/01/2022    2:19 PM 12/19/2021   10:49 AM  12/17/2021    3:24 PM 11/26/2021    8:07 AM 11/11/2021    2:45 PM 10/14/2021    9:30 AM  BP/Weight  Systolic BP 924 268 341 962 229 798 921  Diastolic BP 72 69 73 82 82 65 72  Wt. (Lbs)  142 136 138.4 139.2 135   BMI  25.15 kg/m2 23.34 kg/m2 23.76 kg/m2 24.66 kg/m2 23.91 kg/m2      Controlled, no change in medication   Hypothyroidism Managed by Endo, needs to follow up  Skin ulcer of toe of left foot, limited to breakdown of skin (HCC) Improving however still not completely healed, needs Rheumatology input and follow up, is finally completing steroid course  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[place]) Needs follow up as ulcer persisits, negative vascular work up

## 2022-01-04 NOTE — Assessment & Plan Note (Signed)
Improving however still not completely healed, needs Rheumatology input and follow up, is finally completing steroid course

## 2022-02-09 ENCOUNTER — Other Ambulatory Visit: Payer: Self-pay | Admitting: Family Medicine

## 2022-03-06 ENCOUNTER — Other Ambulatory Visit: Payer: Self-pay | Admitting: Family Medicine

## 2022-03-12 ENCOUNTER — Ambulatory Visit (HOSPITAL_COMMUNITY): Payer: BC Managed Care – PPO

## 2022-03-13 ENCOUNTER — Ambulatory Visit (HOSPITAL_COMMUNITY): Payer: BC Managed Care – PPO

## 2022-03-20 ENCOUNTER — Encounter (HOSPITAL_COMMUNITY): Payer: Self-pay

## 2022-03-20 ENCOUNTER — Ambulatory Visit (HOSPITAL_COMMUNITY)
Admission: RE | Admit: 2022-03-20 | Discharge: 2022-03-20 | Disposition: A | Discharge status: Home / Self Care | Payer: BC Managed Care – PPO | Source: Ambulatory Visit | Attending: Family Medicine | Admitting: Family Medicine

## 2022-03-20 DIAGNOSIS — Z1231 Encounter for screening mammogram for malignant neoplasm of breast: Secondary | ICD-10-CM | POA: Insufficient documentation

## 2022-03-30 ENCOUNTER — Ambulatory Visit: Payer: BC Managed Care – PPO | Admitting: Nurse Practitioner

## 2022-04-01 ENCOUNTER — Ambulatory Visit: Payer: BC Managed Care – PPO | Admitting: Family Medicine

## 2022-04-01 ENCOUNTER — Encounter: Payer: Self-pay | Admitting: Family Medicine

## 2022-04-01 VITALS — BP 148/84 | HR 84 | Ht 63.0 in | Wt 149.1 lb

## 2022-04-01 DIAGNOSIS — I10 Essential (primary) hypertension: Secondary | ICD-10-CM

## 2022-04-01 DIAGNOSIS — M300 Polyarteritis nodosa: Secondary | ICD-10-CM | POA: Diagnosis not present

## 2022-04-01 DIAGNOSIS — E7849 Other hyperlipidemia: Secondary | ICD-10-CM

## 2022-04-01 MED ORDER — SPIRONOLACTONE 25 MG PO TABS: 25.0000 mg | ORAL_TABLET | Freq: Every day | ORAL | 3 refills | Status: DC

## 2022-04-01 MED ORDER — BENZONATATE 100 MG PO CAPS: 100.0000 mg | ORAL_CAPSULE | Freq: Two times a day (BID) | ORAL | 0 refills | Status: DC | PRN

## 2022-04-01 MED ORDER — ROSUVASTATIN CALCIUM 5 MG PO TABS: 5.0000 mg | ORAL_TABLET | Freq: Every day | ORAL | 3 refills | Status: DC

## 2022-04-01 NOTE — Patient Instructions (Addendum)
F/U re evaluate blood pressure in 4 to 6 weeks, call if you need me sooner  New additional medication is prescribed to get blood pressure controlled.  This is spironolactone 25 mg 1 daily.  It is safe to take all 3 blood pressure medicines at 1 time.   Lowest dose of Crestor (rosuvastatin )is prescribed for cholesterol.  Please get fasting lipid CMP and EGFR and CBC 3 to 5 days before your next visit.  Thanks for choosing [facility], we consider it a privelige to serve you.

## 2022-04-06 ENCOUNTER — Encounter: Payer: Self-pay | Admitting: Family Medicine

## 2022-04-06 ENCOUNTER — Telehealth: Payer: Self-pay | Admitting: Nurse Practitioner

## 2022-04-06 DIAGNOSIS — E038 Other specified hypothyroidism: Secondary | ICD-10-CM

## 2022-04-06 NOTE — Assessment & Plan Note (Signed)
Follow up with rheumatology encouraged / advised

## 2022-04-06 NOTE — Assessment & Plan Note (Signed)
Uncontrolled, add spironolactone daily DASH diet and commitment to daily physical activity for a minimum of 30 minutes discussed and encouraged, as a part of hypertension management. The importance of attaining a healthy weight is also discussed.     04/01/2022    2:01 PM 04/01/2022    1:26 PM 04/01/2022    1:25 PM 01/01/2022    2:36 PM 01/01/2022    2:19 PM 12/19/2021   10:49 AM 12/17/2021    3:24 PM  BP/Weight  Systolic BP 123456 123456 [id number]  Diastolic BP 84 80 81 72 69 73 82  Wt. (Lbs)   149.12  142 136 138.4  BMI   26.42 kg/m2  25.15 kg/m2 23.34 kg/m2 23.76 kg/m2

## 2022-04-06 NOTE — Telephone Encounter (Signed)
New orders updated.

## 2022-04-06 NOTE — Assessment & Plan Note (Signed)
Hyperlipidemia:Low fat diet discussed and encouraged.   Lipid Panel  Lab Results  Component Value Date   CHOL 145 10/21/2021   HDL 29 (L) 10/21/2021   LDLCALC 100 (H) 10/21/2021   TRIG 85 10/21/2021   CHOLHDL 5.0 (H) 10/21/2021     Low dose crestor added

## 2022-04-06 NOTE — Progress Notes (Signed)
   Faith Hood     MRN: [id number]      DOB: 1955/07/23   HPI Faith Hood is here for follow up and re-evaluation of chronic medical conditions, medication management and review of any available recent lab and radiology data.  Preventive health is updated, specifically  Cancer screening and Immunization.    The PT denies any adverse reactions to current medications since the last visit.  Recent sinus congestion is improving ROS Denies recent fever or chills.  Denies chest congestion, productive cough or wheezing. Denies chest pains, palpitations and leg swelling Denies abdominal pain, nausea, vomiting,diarrhea or constipation.   Denies dysuria, frequency, hesitancy or incontinence. Denies joint pain, swelling and limitation in mobility. Denies headaches, seizures, numbness, or tingling. Denies depression, anxiety or insomnia. Denies skin break down or rash.   PE  BP (!) 148/84   Pulse 84   Ht '5\' 3"'$  (1.6 m)   Wt 149 lb 1.9 oz (67.6 kg)   SpO2 95%   BMI 26.42 kg/m   Patient alert and oriented and in no cardiopulmonary distress.  HEENT: No facial asymmetry, EOMI,     Neck supple .No sinus tenderness, TM clear  Chest: Clear to auscultation bilaterally.  CVS: S1, S2 no murmurs, no S3.Regular rate.  ABD: Soft non tender.   Ext: No edema  MS: Adequate ROM spine, shoulders, hips and knees.  Skin: Intact, no ulcerations or rash noted.  Psych: Good eye contact, normal affect. Memory intact not anxious or depressed appearing.  CNS: CN 2-12 intact, power,  normal throughout.no focal deficits noted.   Assessment & Plan  Hypertension Uncontrolled, add spironolactone daily DASH diet and commitment to daily physical activity for a minimum of 30 minutes discussed and encouraged, as a part of hypertension management. The importance of attaining a healthy weight is also discussed.     04/01/2022    2:01 PM 04/01/2022    1:26 PM 04/01/2022    1:25 PM 01/01/2022    2:36 PM 01/01/2022     2:19 PM 12/19/2021   10:49 AM 12/17/2021    3:24 PM  BP/Weight  Systolic BP 409 811 914 782 956 213 086  Diastolic BP 84 80 81 72 69 73 82  Wt. (Lbs)   149.12  142 136 138.4  BMI   26.42 kg/m2  25.15 kg/m2 23.34 kg/m2 23.76 kg/m2       Hyperlipidemia Hyperlipidemia:Low fat diet discussed and encouraged.   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

## 2022-04-06 NOTE — Telephone Encounter (Signed)
Pt needs lab orders updated

## 2022-04-08 LAB — T4, FREE: Free T4: 1.06 ng/dL (ref 0.82–1.77)

## 2022-04-08 LAB — TSH: TSH: 4.63 u[IU]/mL — ABNORMAL HIGH (ref 0.450–4.500)

## 2022-04-10 ENCOUNTER — Other Ambulatory Visit: Payer: Self-pay | Admitting: Family Medicine

## 2022-04-15 NOTE — Patient Instructions (Signed)

## 2022-04-17 ENCOUNTER — Other Ambulatory Visit: Payer: Self-pay | Admitting: Family Medicine

## 2022-04-17 ENCOUNTER — Ambulatory Visit: Payer: BC Managed Care – PPO | Admitting: Nurse Practitioner

## 2022-04-17 ENCOUNTER — Ambulatory Visit: Payer: BC Managed Care – PPO | Admitting: Family Medicine

## 2022-04-17 ENCOUNTER — Encounter: Payer: Self-pay | Admitting: Nurse Practitioner

## 2022-04-17 VITALS — BP 140/76 | HR 52 | Ht 63.0 in | Wt 148.0 lb

## 2022-04-17 DIAGNOSIS — E042 Nontoxic multinodular goiter: Secondary | ICD-10-CM | POA: Diagnosis not present

## 2022-04-17 DIAGNOSIS — E038 Other specified hypothyroidism: Secondary | ICD-10-CM | POA: Diagnosis not present

## 2022-04-17 DIAGNOSIS — E063 Autoimmune thyroiditis: Secondary | ICD-10-CM

## 2022-04-17 MED ORDER — LEVOTHYROXINE SODIUM 100 MCG PO TABS: 100.0000 ug | ORAL_TABLET | Freq: Every day | ORAL | 1 refills | Status: DC

## 2022-04-17 NOTE — Progress Notes (Signed)
Endocrinology Follow Up Note                                         04/17/2022, 9:22 AM  Subjective:   Subjective    Faith Hood is a 67 y.o.-year-old female patient being seen in follow up after being seen in consultation for hypothyroidism referred by Fayrene Helper, MD.   Past Medical History:  Diagnosis Date   Cerebral vascular disease    Carotid ultrasound in 10/2011: Mild plaque; tortuous vessels; no definite luminal obstruction.   Elevated LFTs    with positive ASMA and mildly elevated IgG, no biopsy pursued as LFTs normalized.   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  Repeat in 2030.   DIAGNOSTIC LAPAROSCOPY  1978   Gynecologic problems    Social History   Socioeconomic History   Marital status: Single    Spouse name: Not on file   Number of children: 0   Years of education: Not on file   Highest education level: Not on file  Occupational History   Occupation: Middle school teacher retired    Comment: X25 years  Tobacco Use   Smoking status: Never   Smokeless tobacco: Never  Vaping Use   Vaping Use: Never used  Substance and Sexual Activity   Alcohol use: Not Currently    Comment: occasionally. About twice a year.    Drug use: No   Sexual activity: Not Currently  Other Topics Concern   Not on file  Social History Narrative   Lives with sister occasionally   Right Handed   Drinks caffeine occassionally   Social Determinants of Health   Financial Resource Strain: Not on file  Food Insecurity: Not on file  Transportation Needs:  Not on file  Physical Activity: Not on file  Stress: Not on file  Social Connections: Not on file    Family History  Problem Relation Age of Onset   Pancreatic cancer Mother 31       Diagnosed in 2009; currently in hospice pt. htn   Hypertension Mother        + Sister x4   Cancer Father 10       brain tumor   Hypertension Sister    Hypertension Sister    Hypertension Sister    Stroke Sister    Hypertension Sister    Cancer Maternal Grandmother    Cancer Paternal Grandmother    Colon cancer Neg Hx    Breast cancer Neg Hx     Outpatient Encounter Medications as of 04/17/2022  Medication Sig  alendronate (FOSAMAX) 70 MG tablet TAKE 1 TABLET BY MOUTH ONCE A WEEK WITH A FULL GLASS OF WATER ON AN EMPTY STOMACH   amLODipine (NORVASC) 10 MG tablet Take 1 tablet by mouth once daily   aspirin EC 81 MG tablet Take 81 mg by mouth daily. Swallow whole.   benzonatate (TESSALON) 100 MG capsule TAKE 1 CAPSULE BY MOUTH TWICE DAILY AS NEEDED FOR COUGH   losartan-hydrochlorothiazide (HYZAAR) 100-25 MG tablet Take 1 tablet by mouth once daily   pregabalin (LYRICA) 50 MG capsule Take 1 capsule (50 mg total) by mouth 2 (two) times daily.   rosuvastatin (CRESTOR) 5 MG tablet Take 1 tablet (5 mg total) by mouth daily.   spironolactone (ALDACTONE) 25 MG tablet Take 1 tablet (25 mg total) by mouth daily.   [DISCONTINUED] levothyroxine (SYNTHROID) 88 MCG tablet Take 1 tablet (88 mcg total) by mouth daily before breakfast.   levothyroxine (SYNTHROID) 100 MCG tablet Take 1 tablet (100 mcg total) by mouth daily before breakfast.   No facility-administered encounter medications on file as of 04/17/2022.    ALLERGIES: Allergies  Allergen Reactions   Atorvastatin Other (See Comments)    Muscle pain   VACCINATION STATUS: Immunization History  Administered Date(s) Administered   Influenza Split 12/29/2010, 11/10/2011   Influenza Whole 01/24/2009, 12/17/2009   Influenza,inj,Quad PF,6+ Mos 12/14/2012,  01/31/2014, 01/09/2015, 02/04/2016, 11/23/2016, 12/06/2017, 12/12/2018, 12/26/2019, 10/14/2021   Influenza-Unspecified 01/09/2021   Janssen (J&J) SARS-COV-2 Vaccination 11/28/2019   Moderna Sars-Covid-2 Vaccination 11/02/2019, 11/30/2019, 04/28/2020, 02/19/2021   PPD Test 12/14/2012, 12/19/2012   Td 12/17/2009   Tdap 05/08/2020   Zoster Recombinat (Shingrix) 02/03/2018, 06/27/2018     HPI   Faith Hood is a patient with the above medical history. she was diagnosed with hypothyroidism at approximate age of 14 years (newly diagnosed in March 2022), which required subsequent initiation of thyroid hormone supplementation. she was given various doses of Levothyroxine since, currently on 88 micrograms. she reports compliance to this medication:  Taking it daily on empty stomach  with water, separated by >30 minutes before breakfast and other medications , and by at least 4 hours from calcium, iron, PPIs, multivitamins.  I reviewed patient's thyroid tests:  Lab Results  Component Value Date   TSH 4.630 (H) 04/07/2022   TSH 7.280 (H) 09/15/2021   TSH 1.640 05/12/2021   TSH 5.260 (H) 03/13/2021   TSH 2.929 12/04/2020   TSH 6.100 (H) 11/13/2020   TSH 5.550 (H) 08/27/2020   TSH 4.440 06/10/2020   TSH 42.500 (H) 05/01/2020   TSH 1.38 11/28/2019   FREET4 1.06 04/07/2022   FREET4 0.97 09/15/2021   FREET4 1.22 05/12/2021   FREET4 0.86 03/13/2021   FREET4 0.83 11/13/2020   FREET4 0.93 08/27/2020   FREET4 1.04 06/10/2020   FREET4 0.9 06/23/2018   FREET4 0.67 (L) 03/31/2014   She also had thyroid ultrasound in 08/2020 which showed multiple small nodules bilaterally, none of which met criteria for dedicated follow up or biopsy.  Pt denies feeling nodules in neck, hoarseness, dysphagia/odynophagia, SOB with lying down.  she does family history of thyroid disorders in her sister (unsure of which but knows it was surgically removed).  She does report her sister was unable to tolerate the generic  form of the hormone and has done well with branded Synthroid.  No family history of thyroid cancer.  No history of radiation therapy to head or neck.  No recent use of iodine supplements.  Denies use of Biotin containing supplements.  I reviewed her chart and she also has a history of HTN, HLD, transaminitis.   Review of systems  Constitutional: + Minimally fluctuating body weight,  current Body mass index is 26.22 kg/m. , + fatigue-improving , no subjective hyperthermia, no subjective hypothermia Eyes: no blurry vision, no xerophthalmia ENT: no sore throat, no nodules palpated in throat, no dysphagia/odynophagia, no hoarseness Cardiovascular: no chest pain, no shortness of breath, no palpitations, no leg swelling Respiratory: no cough, no shortness of breath Gastrointestinal: no nausea/vomiting/diarrhea Musculoskeletal: + generalized muscle/joint aches Skin: no rashes, no hyperemia Neurological: no tremors, no numbness, no tingling, no dizziness Psychiatric: no depression, no anxiety   Objective:   Objective     BP (!) 140/76 (BP Location: Left Arm, Patient Position: Sitting, Cuff Size: Normal) Comment: Retake with manuel cuff - pateint is under PCP care for Hypertension  Pulse (!) 52   Ht '5\' 3"'$  (1.6 m)   Wt 148 lb (67.1 kg)   BMI 26.22 kg/m  Wt Readings from Last 3 Encounters:  04/17/22 148 lb (67.1 kg)  04/01/22 149 lb 1.9 oz (67.6 kg)  01/01/22 142 lb (64.4 kg)    BP Readings from Last 3 Encounters:  04/17/22 (!) 140/76  04/01/22 (!) 148/84  01/01/22 132/72      Physical Exam- Limited  Constitutional:  Body mass index is 26.22 kg/m. , not in acute distress, normal state of mind Eyes:  EOMI, no exophthalmos Musculoskeletal: no gross deformities, strength intact in all four extremities, no gross restriction of joint movements Skin:  no rashes, no hyperemia Neurological: no tremor with outstretched hands   CMP ( most recent) CMP     Component Value Date/Time    NA 134 10/21/2021 0809   K 4.0 10/21/2021 0809   CL 97 10/21/2021 0809   CO2 22 10/21/2021 0809   GLUCOSE 95 10/21/2021 0809   GLUCOSE 89 12/04/2020 1428   BUN 13 10/21/2021 0809   CREATININE 0.92 10/21/2021 0809   CREATININE 0.79 11/28/2019 1449   CALCIUM 8.7 10/21/2021 0809   PROT 6.8 10/21/2021 0809   ALBUMIN 3.9 10/21/2021 0809   AST 17 10/21/2021 0809   ALT 11 10/21/2021 0809   ALKPHOS 52 10/21/2021 0809   BILITOT 0.6 10/21/2021 0809   GFRNONAA >60 12/04/2020 1428   GFRNONAA 80 11/28/2019 1449   GFRAA 93 11/28/2019 1449     Diabetic Labs (most recent): Lab Results  Component Value Date   HGBA1C 5.5 06/23/2018   HGBA1C 5.4 05/15/2016   HGBA1C 5.4 02/04/2016     Lipid Panel ( most recent) Lipid Panel     Component Value Date/Time   CHOL 145 10/21/2021 0809   TRIG 85 10/21/2021 0809   HDL 29 (L) 10/21/2021 0809   CHOLHDL 5.0 (H) 10/21/2021 0809   CHOLHDL 3.5 12/04/2020 1427   VLDL 13 12/04/2020 1427   LDLCALC 100 (H) 10/21/2021 0809   LDLCALC 143 (H) 09/14/2019 1340   LABVLDL 16 10/21/2021 0809       Lab Results  Component Value Date   TSH 4.630 (H) 04/07/2022   TSH 7.280 (H) 09/15/2021   TSH 1.640 05/12/2021   TSH 5.260 (H) 03/13/2021   TSH 2.929 12/04/2020   TSH 6.100 (H) 11/13/2020   TSH 5.550 (H) 08/27/2020   TSH 4.440 06/10/2020   TSH 42.500 (H) 05/01/2020   TSH 1.38 11/28/2019   FREET4 1.06 04/07/2022   FREET4 0.97 09/15/2021   FREET4 1.22 05/12/2021   FREET4 0.86 03/13/2021   FREET4  0.83 11/13/2020   FREET4 0.93 08/27/2020   FREET4 1.04 06/10/2020   FREET4 0.9 06/23/2018   FREET4 0.67 (L) 03/31/2014    Thyroid US from 09/06/20 CLINICAL DATA:  Hypothyroidism   EXAM: THYROID ULTRASOUND   TECHNIQUE: Ultrasound examination of the thyroid gland and adjacent soft tissues was performed.   COMPARISON:  07/19/2018 and previous back to 04/09/2014   FINDINGS: Parenchymal Echotexture: Moderately heterogenous   Isthmus: 1.4 cm  thickness, previously 1.2   Right lobe: 6.1 x 1.2 x 2.3 cm, previously 4.7 x 2.4 x 2.7   Left lobe: 5.9 x 3 x 2.6 cm, previously 5 x 2.5 x 2.3   _________________________________________________________   Estimated total number of nodules >/= 1 cm: 3   Number of spongiform nodules >/=  2 cm not described below (TR1): 0   Number of mixed cystic and solid nodules >/= 1.5 cm not described below ([place]): 0   _________________________________________________________   Nodule # 1:   Prior biopsy: No   Location: Right; superior   Maximum size: 1.6 cm; Other 2 dimensions: 1.1 x 1.4 cm, previously, 1.4 x 1.2 x 1.4 cm   Composition: solid/almost completely solid (2)   Echogenicity: hyperechoic (1)   Shape: not taller-than-wide (0)   Margins: smooth (0)   Echogenic foci: none (0)   ACR TI-RADS total points: 3.   ACR TI-RADS risk category:  TR3 (3 points).   Significant change in size (>/= 20% in two dimensions and minimal increase of 2 mm): No   Change in features: No   Change in ACR TI-RADS risk category: No   ACR TI-RADS recommendations:   *Given size (>/= 1.5 - 2.4 cm) and appearance, a follow-up ultrasound in 1 year should be considered based on TI-RADS criteria.   _________________________________________________________   Nodule # 2: 0.6 cm calcification, mid left, stable; This nodule does NOT meet TI-RADS criteria for biopsy or dedicated follow-up.   _________________________________________________________   Nodule # 3:   Location: Left; superior   Maximum size: 1.2 cm; Other 2 dimensions: 0.8 x 0.9 cm   Composition: solid/almost completely solid (2)   Echogenicity: hyperechoic (1)   Shape: not taller-than-wide (0)   Margins: smooth (0)   Echogenic foci: none (0)   ACR TI-RADS total points: 3.   ACR TI-RADS risk category: TR3 (3 points).   ACR TI-RADS recommendations:   Given size (<1.4 cm) and appearance, this nodule does NOT meet TI-RADS  criteria for biopsy or dedicated follow-up.   _________________________________________________________   Nodule # 4:   Location: Isthmus; right of midline   Maximum size: 1.2 cm; Other 2 dimensions: 1 x 0.8 cm   Composition: solid/almost completely solid (2)   Echogenicity: isoechoic (1)   Shape: not taller-than-wide (0)   Margins: ill-defined (0)   Echogenic foci: none (0)   ACR TI-RADS total points: 3.   ACR TI-RADS risk category: TR3 (3 points).   ACR TI-RADS recommendations:   Given size (<1.4 cm) and appearance, this nodule does NOT meet TI-RADS criteria for biopsy or dedicated follow-up.   IMPRESSION: 1. Enlarged heterogenous thyroid with nodules as above. None meet criteria for biopsy. 2. Recommend annual/biennial ultrasound follow-up of right nodule as above, until stability x5 years confirmed.   The above is in keeping with the ACR TI-RADS recommendations - J Am Coll Radiol 2017;14:587-595.     Electronically Signed   By: Lucrezia Europe M.D.   On: 09/07/2020 08:13 --------------------------------------------------------------------------------------------------------- Thyroid US from 06/02/21 CLINICAL DATA:  Goiter.   EXAM: THYROID ULTRASOUND   TECHNIQUE: Ultrasound examination of the thyroid gland and adjacent soft tissues was performed.   COMPARISON:  09/06/2020, 07/19/2018   FINDINGS: Parenchymal Echotexture: Moderately heterogenous   Isthmus: 1.5 cm, previously 1.4 cm   Right lobe: 5.1 x 2.8 x 2.3 cm, previously 6.1 x 1.2 x 2.3 cm   Left lobe: 4.6 x 2.5 x 2.7 cm, previously 5.9 x 3.0 x 2.6 cm   _________________________________________________________   Estimated total number of nodules >/= 1 cm: 3   Number of spongiform nodules >/=  2 cm not described below (TR1): 0   Number of mixed cystic and solid nodules >/= 1.5 cm not described below ([place]): 0   _________________________________________________________   Nodule # 1:   Prior  biopsy: No   Location: Isthmus; Inferior   Maximum size: 1.4 cm; Other 2 dimensions: 1.0 x 0.9 cm, previously, 1.2 x 1.0 x 0.8 cm   Composition: solid/almost completely solid (2)   Echogenicity: hyperechoic (1)   Shape: not taller-than-wide (0)   Margins: ill-defined (0)   Echogenic foci: none (0)   ACR TI-RADS total points: 3.   ACR TI-RADS risk category:  TR3 (3 points).   Significant change in size (>/= 20% in two dimensions and minimal increase of 2 mm): No   Change in features: No   Change in ACR TI-RADS risk category: No   ACR TI-RADS recommendations:   Given size (<1.5 cm) and appearance, this nodule does NOT meet TI-RADS criteria for biopsy or dedicated follow-up.   _________________________________________________________   Nodule # 2 (previously labeled 1):   Prior biopsy: No   Location: Right; Superior   Maximum size: 1.5 cm; Other 2 dimensions: 1.3 x 1.2 cm, previously, 1.6 x 1.1 x 1.4 cm   Composition: solid/almost completely solid (2)   Echogenicity: hyperechoic (1)   Shape: not taller-than-wide (0)   Margins: smooth (0)   Echogenic foci: none (0)   ACR TI-RADS total points: 3.   ACR TI-RADS risk category:  TR3 (3 points).   Significant change in size (>/= 20% in two dimensions and minimal increase of 2 mm): No   Change in features: No   Change in ACR TI-RADS risk category: No   ACR TI-RADS recommendations:   *Given size (>/= 1.5 - 2.4 cm) and appearance, a follow-up ultrasound in 1 year should be considered based on TI-RADS criteria.   _________________________________________________________   Nodule # 3:   Prior biopsy: No   Location: Left; Superior   Maximum size: 1.0 cm; Other 2 dimensions: 0.9 x 0.8 cm, previously, 1.2 x 0.8 x 0.9 cm   Composition: solid/almost completely solid (2)   Echogenicity: hyperechoic (1)   Shape: not taller-than-wide (0)   Margins: smooth (0)   Echogenic foci: none (0)   ACR  TI-RADS total points: 3.   ACR TI-RADS risk category:  TR3 (3 points).   Significant change in size (>/= 20% in two dimensions and minimal increase of 2 mm): No   Change in features: No   Change in ACR TI-RADS risk category: No   ACR TI-RADS recommendations:   Given size (<1.4 cm) and appearance, this nodule does NOT meet TI-RADS criteria for biopsy or dedicated follow-up.   _________________________________________________________   IMPRESSION: 1. Similar appearing enlarged, heterogenous thyroid gland. 2. Similar appearance of previously visualized right superior solid thyroid nodule (labeled 2, 1.5 cm, previously 1.6 cm) which again meets criteria (TI-RADS category 3) for 1 year ultrasound surveillance.  This study marks 3 years stability. 3. The remaining visualized thyroid nodules appear benign and do not warrant additional follow-up.   The above is in keeping with the ACR TI-RADS recommendations - J Am Coll Radiol 2017;14:587-595.   Ruthann Cancer, MD   [facility]   [facility]     Electronically Signed   By: Ruthann Cancer M.D.   On: 06/02/2021 14:28   Latest Reference Range & Units 12/04/20 14:27 03/13/21 16:03 05/12/21 15:52 09/15/21 11:25 04/07/22 16:11  TSH 0.450 - 4.500 uIU/mL 2.929 5.260 (H) 1.640 7.280 (H) 4.630 (H)  T4,Free(Direct) 0.82 - 1.77 ng/dL  0.86 1.22 0.97 1.06  (H): Data is abnormally high Assessment & Plan:   ASSESSMENT / PLAN:  1. Hypothyroidism- r/t Hashimoto's thyroiditis   Patient with relatively new hypothyroidism, on levothyroxine therapy. Her antibody testing confirms suspicion of autoimmune thyroid dysfunction.    Her previsit thyroid function tests are consistent with under-replacement however she does report she has missed several doses here and there.   She does have symptoms of under-active thyroid therefore will increase her Levothyroxine to 100 mcg po daily before breakfast (closer  to her weight-based maximum dose of 107 mcg).  Will recheck TFTs prior to next visit and adjust dose further if needed.  - We discussed about correct intake of levothyroxine, at fasting, with water, separated by at least 30 minutes from breakfast, and separated by more than 4 hours from calcium, iron, multivitamins, acid reflux medications (PPIs). -Patient is made aware of the fact that thyroid hormone replacement is needed for life, dose to be adjusted by periodic monitoring of thyroid function tests.  2. Multinodular thyroid Multiple thyroid nodules recommending follow up with surveillance ultrasound in 1 year (around 05/2022).    3. Hypertension I advised her to reach out to her PCP regarding high BP readings, despite recent medication change.  I also encouraged patient to monitor BP at home and give her reading to her PCP.    I spent  20  minutes in the care of the patient today including review of labs from Thyroid Function, CMP, and other relevant labs ; imaging/biopsy records (current and previous including abstractions from other facilities); face-to-face time discussing  her lab results and symptoms, medications doses, her options of short and long term treatment based on the latest standards of care / guidelines;   and documenting the encounter.  Faith Hood  participated in the discussions, expressed understanding, and voiced agreement with the above plans.  All questions were answered to her satisfaction. she is encouraged to contact clinic should she have any questions or concerns prior to her return visit.   FOLLOW UP PLAN:  Return in about 3 months (around 07/16/2022) for Thyroid follow up, Previsit labs.  Faith Hood, [facility] [address] [place], [place] [address] Phone: [phone] Fax: [phone]  04/17/2022, 9:22 AM

## 2022-04-23 ENCOUNTER — Encounter: Payer: Self-pay | Admitting: Radiology

## 2022-05-08 ENCOUNTER — Ambulatory Visit: Payer: BC Managed Care – PPO | Admitting: Family Medicine

## 2022-05-17 ENCOUNTER — Other Ambulatory Visit: Payer: Self-pay | Admitting: Family Medicine

## 2022-05-25 ENCOUNTER — Ambulatory Visit (HOSPITAL_COMMUNITY)
Admission: RE | Admit: 2022-05-25 | Discharge: 2022-05-25 | Disposition: A | Discharge status: Home / Self Care | Payer: BC Managed Care – PPO | Source: Ambulatory Visit | Attending: Nurse Practitioner | Admitting: Nurse Practitioner

## 2022-05-25 DIAGNOSIS — E042 Nontoxic multinodular goiter: Secondary | ICD-10-CM | POA: Insufficient documentation

## 2022-05-25 DIAGNOSIS — E038 Other specified hypothyroidism: Secondary | ICD-10-CM | POA: Diagnosis not present

## 2022-05-25 DIAGNOSIS — E063 Autoimmune thyroiditis: Secondary | ICD-10-CM | POA: Diagnosis present

## 2022-06-03 LAB — CBC
Hematocrit: 37.9 % (ref 34.0–46.6)
Hemoglobin: 12.2 g/dL (ref 11.1–15.9)
MCH: 28 pg (ref 26.6–33.0)
MCHC: 32.2 g/dL (ref 31.5–35.7)
MCV: 87 fL (ref 79–97)
Platelets: 247 10*3/uL (ref 150–450)
RBC: 4.35 x10E6/uL (ref 3.77–5.28)
RDW: 14.2 % (ref 11.7–15.4)
WBC: 6 10*3/uL (ref 3.4–10.8)

## 2022-06-03 LAB — CMP14+EGFR
ALT: 13 IU/L (ref 0–32)
AST: 15 IU/L (ref 0–40)
Albumin/Globulin Ratio: 1.3 (ref 1.2–2.2)
Albumin: 4.4 g/dL (ref 3.9–4.9)
Alkaline Phosphatase: 74 IU/L (ref 44–121)
BUN/Creatinine Ratio: 20 (ref 12–28)
BUN: 18 mg/dL (ref 8–27)
Bilirubin Total: 0.4 mg/dL (ref 0.0–1.2)
CO2: 24 mmol/L (ref 20–29)
Calcium: 9.9 mg/dL (ref 8.7–10.3)
Chloride: 101 mmol/L (ref 96–106)
Creatinine, Ser: 0.92 mg/dL (ref 0.57–1.00)
Globulin, Total: 3.3 g/dL (ref 1.5–4.5)
Glucose: 84 mg/dL (ref 70–99)
Potassium: 4 mmol/L (ref 3.5–5.2)
Sodium: 143 mmol/L (ref 134–144)
Total Protein: 7.7 g/dL (ref 6.0–8.5)
eGFR: 70 mL/min/{1.73_m2} (ref >59)

## 2022-06-03 LAB — LIPID PANEL
Chol/HDL Ratio: 3.2 ratio (ref 0.0–4.4)
Cholesterol, Total: 151 mg/dL (ref 100–199)
HDL: 47 mg/dL (ref >39)
LDL Chol Calc (NIH): 90 mg/dL (ref 0–99)
Triglycerides: 69 mg/dL (ref 0–149)
VLDL Cholesterol Cal: 14 mg/dL (ref 5–40)

## 2022-06-04 ENCOUNTER — Ambulatory Visit: Payer: BC Managed Care – PPO | Admitting: Family Medicine

## 2022-06-11 ENCOUNTER — Ambulatory Visit: Payer: BC Managed Care – PPO | Admitting: Family Medicine

## 2022-06-11 ENCOUNTER — Encounter: Payer: Self-pay | Admitting: Family Medicine

## 2022-06-11 VITALS — BP 160/90 | HR 60 | Resp 16 | Ht 64.0 in | Wt 148.0 lb

## 2022-06-11 DIAGNOSIS — E7849 Other hyperlipidemia: Secondary | ICD-10-CM | POA: Diagnosis not present

## 2022-06-11 DIAGNOSIS — R200 Anesthesia of skin: Secondary | ICD-10-CM

## 2022-06-11 DIAGNOSIS — I1A0 Resistant hypertension: Secondary | ICD-10-CM | POA: Diagnosis not present

## 2022-06-11 DIAGNOSIS — E063 Autoimmune thyroiditis: Secondary | ICD-10-CM

## 2022-06-11 DIAGNOSIS — Z1211 Encounter for screening for malignant neoplasm of colon: Secondary | ICD-10-CM

## 2022-06-11 DIAGNOSIS — M25532 Pain in left wrist: Secondary | ICD-10-CM | POA: Diagnosis not present

## 2022-06-11 DIAGNOSIS — E038 Other specified hypothyroidism: Secondary | ICD-10-CM

## 2022-06-11 DIAGNOSIS — G603 Idiopathic progressive neuropathy: Secondary | ICD-10-CM

## 2022-06-11 MED ORDER — CARVEDILOL 3.125 MG PO TABS: 3.1250 mg | ORAL_TABLET | Freq: Two times a day (BID) | ORAL | 3 refills | Status: DC

## 2022-06-11 NOTE — Patient Instructions (Addendum)
Annual exam and August, call if you need me sooner.  You are referred to orthopedics releft wrist pain.  You are referred to the advanced hypertensive clinic for control of your blood pressure.  Please stop spironolactone and start carvedilol 1 tablet twice daily take at 8 AM and 8 PM for your blood pressure.  Continue amlodipine and losartan HCTZ as before.  Please focus on plant-based eating as we discussed.  Please commit to regular exercise.  Thanks for choosing [facility], we consider it a privelige to serve you. Marland Kitchen

## 2022-06-11 NOTE — Progress Notes (Signed)
   Faith Hood     MRN: [id number]      DOB: 01-22-1956   HPI Faith Hood is here for follow up and re-evaluation of chronic medical conditions, medication management and review of any available recent lab and radiology data.  Preventive health is updated, specifically  Cancer screening and Immunization.   Questions or concerns regarding consultations or procedures which the PT has had in the interim are  addressed. The PT denies any adverse reactions to current medications since the last visit.  4 week h/o painful swelling of right wrist up to an 8 generally a 6, radiates to shoulder New numbness in both feet has been referred by her Rheumatologist to neurology and has a appt scheduled in next 2 weeks  ROS Denies recent fever or chills. Denies sinus pressure, nasal congestion, ear pain or sore throat. Denies chest congestion, productive cough or wheezing. Denies chest pains, palpitations and leg swelling Denies abdominal pain, nausea, vomiting,diarrhea or constipation.   Denies dysuria, frequency, hesitancy or incontinence. D. Denies depression, anxiety or insomnia. Denies skin break down or rash.   PE  BP (!) 160/90   Pulse 60   Resp 16   Ht  (1.626 m)   Wt 148 lb (67.1 kg)   SpO2 96%   BMI 25.40 kg/m   Patient alert and oriented and in no cardiopulmonary distress.  HEENT: No facial asymmetry, EOMI,     Neck supple .  Chest: Clear to auscultation bilaterally.  CVS: S1, S2 no murmurs, no S3.Regular rate.  ABD: Soft non tender.   Ext: No edema  MS: Adequate though reduced ROM spine, shoulders, hips and knees.Left wrist tender nodule   Skin: Intact, no ulcerations or rash noted.  Psych: Good eye contact, normal affect. Memory intact not anxious or depressed appearing.  CNS: CN 2-12 intact, power,  normal throughout.no focal deficits noted.   Assessment & Plan  Resistant hypertension Remains uncontrolled despite compliance and polypharmacy, refer [facility], start  carvedilol and stop spironolactone DASH diet and commitment to daily physical activity for a minimum of 30 minutes discussed and encouraged, as a part of hypertension management. The importance of attaining a healthy weight is also discussed.     06/11/2022    9:24 AM 06/11/2022    9:23 AM 06/11/2022    8:57 AM 04/17/2022    9:02 AM 04/17/2022    8:56 AM 04/01/2022    2:01 PM 04/01/2022    1:26 PM  BP/Weight  Systolic BP 160 160 168 140 161 148 148  Diastolic BP 90 90 77 76 83 84 80  Wt. (Lbs)   148  148    BMI   25.4 kg/m2  26.22 kg/m2         Left wrist pain Refer Ortho for eval and management, exam suggestive of tendonitis  and cyst  Hyperlipidemia Hyperlipidemia:Low fat diet discussed and encouraged.   Lipid Panel  Lab Results  Component Value Date   CHOL 151 06/02/2022   HDL 47 06/02/2022   LDLCALC 90 06/02/2022   TRIG 69 06/02/2022   CHOLHDL 3.2 06/02/2022     Controlled, no change in medication   Idiopathic progressive neuropathy Progressive worsening of lower extremity numbness , has upcoming appt with neurology  Numbness of feet Worsening , Neurology to eval  Hypothyroidism Managed by endo and has upcoming appt

## 2022-06-15 ENCOUNTER — Encounter: Payer: Self-pay | Admitting: Family Medicine

## 2022-06-15 DIAGNOSIS — M25532 Pain in left wrist: Secondary | ICD-10-CM | POA: Insufficient documentation

## 2022-06-15 NOTE — Assessment & Plan Note (Signed)
Remains uncontrolled despite compliance and polypharmacy, refer [facility], start carvedilol and stop spironolactone DASH diet and commitment to daily physical activity for a minimum of 30 minutes discussed and encouraged, as a part of hypertension management. The importance of attaining a healthy weight is also discussed.     06/11/2022    9:24 AM 06/11/2022    9:23 AM 06/11/2022    8:57 AM 04/17/2022    9:02 AM 04/17/2022    8:56 AM 04/01/2022    2:01 PM 04/01/2022    1:26 PM  BP/Weight  Systolic BP 160 160 168 140 161 148 148  Diastolic BP 90 90 77 76 83 84 80  Wt. (Lbs)   148  148    BMI   25.4 kg/m2  26.22 kg/m2

## 2022-06-15 NOTE — Assessment & Plan Note (Signed)
Refer Ortho for eval and management, exam suggestive of tendonitis  and cyst

## 2022-06-15 NOTE — Assessment & Plan Note (Addendum)
Managed by endo and has upcoming appt 

## 2022-06-15 NOTE — Assessment & Plan Note (Signed)
Progressive worsening of lower extremity numbness , has upcoming appt with neurology

## 2022-06-15 NOTE — Assessment & Plan Note (Signed)
Worsening , Neurology to eval

## 2022-06-15 NOTE — Assessment & Plan Note (Signed)
Hyperlipidemia:Low fat diet discussed and encouraged.   Lipid Panel  Lab Results  Component Value Date   CHOL 151 06/02/2022   HDL 47 06/02/2022   LDLCALC 90 06/02/2022   TRIG 69 06/02/2022   CHOLHDL 3.2 06/02/2022     Controlled, no change in medication

## 2022-06-29 ENCOUNTER — Other Ambulatory Visit: Payer: Self-pay | Admitting: Family Medicine

## 2022-07-01 ENCOUNTER — Other Ambulatory Visit: Payer: Self-pay

## 2022-07-01 MED ORDER — ALENDRONATE SODIUM 70 MG PO TABS: ORAL_TABLET | ORAL | 0 refills | Status: DC

## 2022-07-02 ENCOUNTER — Ambulatory Visit: Payer: Medicare PPO | Admitting: Neurology

## 2022-07-02 ENCOUNTER — Encounter: Payer: Self-pay | Admitting: Neurology

## 2022-07-02 VITALS — BP 138/76 | Ht 64.0 in | Wt 146.0 lb

## 2022-07-02 DIAGNOSIS — R200 Anesthesia of skin: Secondary | ICD-10-CM

## 2022-07-02 NOTE — Progress Notes (Signed)
[facility]  PATIENT: Faith Hood DOB: Jul 26, 1955  REFERRING CLINICIAN: Kerri Perches, MD HISTORY FROM: Patient  REASON FOR VISIT: Numbness/tingling in both feet/hands    HISTORICAL  CHIEF COMPLAINT:  Chief Complaint  Patient presents with   Follow-up    Rm 12, alone, f/u numbness, patient feels like numbness is worse in both feet, has had toe ulcers and saw podiatry, now healed. Hands are tingly at times, left more than right.   INTERVAL HISTORY 07/02/2022:  Patient presents today for follow-up, she is alone.  Last visit was in August 2022.  At that time we started her on gabapentin for the neuropathic pain/numbness.  She reports having side effect from the gabapentin and discontinued it.  Today he is stating that the numbness is getting worse.  She reports developing left foot ulcers after possibly wearing some tight shoes and working out, the foot ulcers healed but she is having more numbness.  She did follow-up with vascular and was told everything was normal, his ABI was normal.  Again she does complain of pain with ambulation.  On top of the bilateral feet numbness, she is also complaining of occasional bilateral hands tingling and pain worse on the right.  Again pain is intermittent.  She does take Tylenol as needed for the pain.  Denies any recent falls.   HISTORY OF PRESENT ILLNESS:  This is a 67 year old woman with past medical history of arthritis, osteoporosis on Fosamax, hypertension, hypothyroidism and hyperlipidemia who is presenting with complaint of tingling/numbness in the bilateral feet and hands for the past year and metallic taste in the mouth.  Patient said the numbness and tingling started for the past year, she feels it more at the bottom of her feet and on her toes.  The numbness tingling is constant and does not interfere with walking or driving.  She said that heating pad makes it better and sensations are far worse at night.  She denies the  urge to move her leg or kick her legs around.   Patient also mentioned that she had a history of polyarthritis, he was initially on steroids for 6 months but discontinued 6 months ago.  She is also on Fosamax for osteoporosis for the past year.   With the abnormal sensation described as tingling and numbness, she also feels lightheaded.  Patient denies spinning sensation, or feeling unsteady, but she felt like she has an abnormal sensation. She describes it as feeling detached, said that she cannot function, she is in the room but she feels far away. This feeling is constant but no associated nausea, no vomiting, no falls, no other symptoms.   OTHER MEDICAL CONDITIONS: Polyarthritis, Osteoporosis, HTN, Hypothyroidism HLD,    REVIEW OF SYSTEMS: Full 14 system review of systems performed and negative with exception of: as noted in the HPI   ALLERGIES: Allergies  Allergen Reactions   Atorvastatin Other (See Comments)    Muscle pain    HOME MEDICATIONS: Outpatient Medications Prior to Visit  Medication Sig Dispense Refill   alendronate (FOSAMAX) 70 MG tablet TAKE 1 TABLET BY MOUTH ONCE A WEEK ON AN EMPTY STOMACH WITH A FULL GLASS OF WATER 4 tablet 0   amLODipine (NORVASC) 10 MG tablet Take 1 tablet by mouth once daily 90 tablet 0   aspirin EC 81 MG tablet Take 81 mg by mouth daily. Swallow whole.     carvedilol (COREG) 3.125 MG tablet Take 1 tablet (3.125 mg total) by mouth 2 (two)  times daily with a meal. 60 tablet 3   levothyroxine (SYNTHROID) 100 MCG tablet Take 1 tablet (100 mcg total) by mouth daily before breakfast. 90 tablet 1   losartan-hydrochlorothiazide (HYZAAR) 100-25 MG tablet Take 1 tablet by mouth once daily 90 tablet 0   pregabalin (LYRICA) 50 MG capsule Take 1 capsule (50 mg total) by mouth 2 (two) times daily. 60 capsule 1   rosuvastatin (CRESTOR) 5 MG tablet Take 1 tablet (5 mg total) by mouth daily. 30 tablet 3   No facility-administered medications prior to visit.     PAST MEDICAL HISTORY: Past Medical History:  Diagnosis Date   Cerebral vascular disease    Carotid ultrasound in 10/2011: Mild plaque; tortuous vessels; no definite luminal obstruction.   Elevated LFTs    with positive ASMA and mildly elevated IgG, no biopsy pursued as LFTs normalized.   Hepatic steatosis    By CT scan   Hyperlipidemia    Hypertension    Hypothyroidism due to Hashimoto's thyroiditis 2021   Neuropathy    Overweight(278.02)    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HCC) 2021   Uterine leiomyoma    By CT scan    PAST SURGICAL HISTORY: Past Surgical History:  Procedure Laterality Date   COLONOSCOPY  12/2007   Negative screening study   COLONOSCOPY N/A 08/17/2018   Surgeon: Corbin Ade, MD; normal exam.  Repeat in 2030.   DIAGNOSTIC LAPAROSCOPY  1978   Gynecologic problems    FAMILY HISTORY: Family History  Problem Relation Age of Onset   Pancreatic cancer Mother 59       Diagnosed in 2009; currently in hospice pt. htn   Hypertension Mother        + Sister x4   Cancer Father 79       brain tumor   Hypertension Sister    Hypertension Sister    Hypertension Sister    Stroke Sister    Hypertension Sister    Cancer Maternal Grandmother    Cancer Paternal Grandmother    Colon cancer Neg Hx    Breast cancer Neg Hx     SOCIAL HISTORY: Social History   Socioeconomic History   Marital status: Single    Spouse name: Not on file   Number of children: 0   Years of education: Not on file   Highest education level: Not on file  Occupational History   Occupation: Middle school teacher retired    Comment: X25 years  Tobacco Use   Smoking status: Never   Smokeless tobacco: Never  Vaping Use   Vaping Use: Never used  Substance and Sexual Activity   Alcohol use: Not Currently    Comment: occasionally. About twice a year.    Drug use: No   Sexual activity: Not Currently  Other Topics Concern   Not on file  Social History Narrative   Lives with sister  occasionally   Right Handed   Social Determinants of Health   Financial Resource Strain: Not on file  Food Insecurity: Not on file  Transportation Needs: Not on file  Physical Activity: Not on file  Stress: Not on file  Social Connections: Not on file  Intimate Partner Violence: Not on file     PHYSICAL EXAM  GENERAL EXAM/CONSTITUTIONAL: Vitals:  Vitals:   07/02/22 1004  BP: 138/76  Weight: 146 lb (66.2 kg)  Height: 5\' 4"  (1.626 m)   Body mass index is 25.06 kg/m. Wt Readings from Last 3 Encounters:  07/02/22 146 lb (66.2  kg)  06/11/22 148 lb (67.1 kg)  04/17/22 148 lb (67.1 kg)   Patient is in no distress; well developed, nourished and groomed; neck is supple  MUSCULOSKELETAL: Gait, strength, tone, movements noted in Neurologic exam below  NEUROLOGIC: MENTAL STATUS:  awake, alert, oriented to person, place and time recent and remote memory intact normal attention and concentration language fluent, comprehension intact, naming intact fund of knowledge appropriate  CRANIAL NERVE:  2nd, 3rd, 4th, 6th - visual fields full to confrontation, extraocular muscles intact, no nystagmus 5th - facial sensation symmetric 7th - facial strength symmetric 8th - hearing intact 9th - palate elevates symmetrically, uvula midline 11th - shoulder shrug symmetric 12th - tongue protrusion midline  MOTOR:  normal bulk and tone, full strength in the BUE, BLE, limited due to joint pain. Negative tinel's or Phalen signs.   SENSORY:  normal and symmetric to light touch, pinprick, vibration  COORDINATION:  finger-nose-finger, fine finger movements normal  REFLEXES:  deep tendon reflexes present and symmetric  GAIT/STATION:  normal   DIAGNOSTIC DATA (LABS, IMAGING, TESTING) - I reviewed patient records, labs, notes, testing and imaging myself where available.  Lab Results  Component Value Date   WBC 6.0 06/02/2022   HGB 12.2 06/02/2022   HCT 37.9 06/02/2022   MCV 87  06/02/2022   PLT 247 06/02/2022      Component Value Date/Time   NA 143 06/02/2022 1557   K 4.0 06/02/2022 1557   CL 101 06/02/2022 1557   CO2 24 06/02/2022 1557   GLUCOSE 84 06/02/2022 1557   GLUCOSE 89 12/04/2020 1428   BUN 18 06/02/2022 1557   CREATININE 0.92 06/02/2022 1557   CREATININE 0.79 11/28/2019 1449   CALCIUM 9.9 06/02/2022 1557   PROT 7.7 06/02/2022 1557   ALBUMIN 4.4 06/02/2022 1557   AST 15 06/02/2022 1557   ALT 13 06/02/2022 1557   ALKPHOS 74 06/02/2022 1557   BILITOT 0.4 06/02/2022 1557   GFRNONAA >60 12/04/2020 1428   GFRNONAA 80 11/28/2019 1449   GFRAA 93 11/28/2019 1449   Lab Results  Component Value Date   CHOL 151 06/02/2022   HDL 47 06/02/2022   LDLCALC 90 06/02/2022   TRIG 69 06/02/2022   CHOLHDL 3.2 06/02/2022   Lab Results  Component Value Date   HGBA1C 5.5 06/23/2018   Lab Results  Component Value Date   VITAMINB12 453 10/14/2020   Lab Results  Component Value Date   TSH 4.630 (H) 04/07/2022     ASSESSMENT AND PLAN  67 y.o. year old female with past medical history of osteoporosis, polyarthritis, hypothyroidism and hyperlipidemia who is presenting for follow-up for her bilateral lower extremities numbness and pain and new bilateral hands numbness and pain.  On exam she did have normal sensation, negative Tinel's or Phalen signs.  But due to pain we will order EMG of the bilateral upper extremities to rule out carpal tunnel syndrome and bilateral lower extremities to check for integrity of the nerves.  I will contact patient to go over the results.  I did advise her to restart Lyrica if the pain became moderately severe.  Continue to follow with PCP, return as needed.  I also advised her to start a Vitamin B complex supplement.  She voices understanding.   1. Numbness     Patient Instructions  Continue current medications, if pain becomes severe, restart Lyrica Will obtain EMG nerve conduction study of the bilateral upper extremities  to rule out carpal tunnel syndrome and  bilateral lower extremities to evaluate for peripheral neuropathy.  I will contact you to go over the results Continue to follow with PCP and return as needed.     Orders Placed This Encounter  Procedures   NCV with EMG(electromyography)   NCV with EMG(electromyography)    No orders of the defined types were placed in this encounter.   Return if symptoms worsen or fail to improve.    Windell Norfolk, MD 07/02/2022, 10:57 AM  [facility] [address], [address] [place], [place] [address] [phone]

## 2022-07-02 NOTE — Patient Instructions (Signed)
Continue current medications, if pain becomes severe, restart Lyrica Will obtain EMG nerve conduction study of the bilateral upper extremities to rule out carpal tunnel syndrome and bilateral lower extremities to evaluate for peripheral neuropathy.  I will contact you to go over the results Continue to follow with PCP and return as needed.

## 2022-07-03 ENCOUNTER — Encounter (HOSPITAL_BASED_OUTPATIENT_CLINIC_OR_DEPARTMENT_OTHER): Payer: Self-pay | Admitting: Cardiovascular Disease

## 2022-07-07 ENCOUNTER — Other Ambulatory Visit: Payer: Self-pay | Admitting: Family Medicine

## 2022-07-16 ENCOUNTER — Ambulatory Visit: Payer: BC Managed Care – PPO | Admitting: Nurse Practitioner

## 2022-08-01 LAB — TSH: TSH: 2.77 u[IU]/mL (ref 0.450–4.500)

## 2022-08-01 LAB — T4, FREE: Free T4: 1.22 ng/dL (ref 0.82–1.77)

## 2022-08-03 ENCOUNTER — Other Ambulatory Visit: Payer: Self-pay | Admitting: Family Medicine

## 2022-08-04 NOTE — Patient Instructions (Signed)

## 2022-08-05 ENCOUNTER — Encounter: Payer: Self-pay | Admitting: Nurse Practitioner

## 2022-08-05 ENCOUNTER — Ambulatory Visit: Payer: Medicare PPO | Admitting: Nurse Practitioner

## 2022-08-05 DIAGNOSIS — I10 Essential (primary) hypertension: Secondary | ICD-10-CM | POA: Diagnosis not present

## 2022-08-05 DIAGNOSIS — E063 Autoimmune thyroiditis: Secondary | ICD-10-CM | POA: Diagnosis not present

## 2022-08-05 DIAGNOSIS — E038 Other specified hypothyroidism: Secondary | ICD-10-CM

## 2022-08-05 DIAGNOSIS — E042 Nontoxic multinodular goiter: Secondary | ICD-10-CM | POA: Diagnosis not present

## 2022-08-05 MED ORDER — LEVOTHYROXINE SODIUM 112 MCG PO TABS: 112.0000 ug | ORAL_TABLET | Freq: Every day | ORAL | 1 refills | Status: DC

## 2022-08-05 NOTE — Progress Notes (Signed)
Endocrinology Follow Up Note                                         08/05/2022, 3:00 PM  Subjective:   Subjective    [name] is a 67 y.o.-year-old female patient being seen in follow up after being seen in consultation for hypothyroidism referred by Kerri Perches, MD.   Past Medical History:  Diagnosis Date   Cerebral vascular disease    Carotid ultrasound in 10/2011: Mild plaque; tortuous vessels; no definite luminal obstruction.   Elevated LFTs    with positive ASMA and mildly elevated IgG, no biopsy pursued as LFTs normalized.   Hepatic steatosis    By CT scan   Hyperlipidemia    Hypertension    Hypothyroidism due to Hashimoto's thyroiditis 2021   Neuropathy    Overweight(278.02)    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HCC) 2021   Uterine leiomyoma    By CT scan    Past Surgical History:  Procedure Laterality Date   COLONOSCOPY  12/2007   Negative screening study   COLONOSCOPY N/A 08/17/2018   Surgeon: Corbin Ade, MD; normal exam.  Repeat in 2030.   DIAGNOSTIC LAPAROSCOPY  1978   Gynecologic problems    Social History   Socioeconomic History   Marital status: Single    Spouse name: Not on file   Number of children: 0   Years of education: Not on file   Highest education level: Not on file  Occupational History   Occupation: Middle school teacher retired    Comment: X25 years  Tobacco Use   Smoking status: Never   Smokeless tobacco: Never  Vaping Use   Vaping Use: Never used  Substance and Sexual Activity   Alcohol use: Not Currently    Comment: occasionally. About twice a year.    Drug use: No   Sexual activity: Not Currently  Other Topics Concern   Not on file  Social History Narrative   Lives with sister occasionally   Right Handed   Social Determinants of Health   Financial Resource Strain: Not on file  Food Insecurity: Not on file  Transportation Needs: Not on file   Physical Activity: Not on file  Stress: Not on file  Social Connections: Not on file    Family History  Problem Relation Age of Onset   Pancreatic cancer Mother 46       Diagnosed in 2009; currently in hospice pt. htn   Hypertension Mother        + Sister x4   Cancer Father 69       brain tumor   Hypertension Sister    Hypertension Sister    Hypertension Sister    Stroke Sister    Hypertension Sister    Cancer Maternal Grandmother    Cancer Paternal Grandmother    Colon cancer Neg Hx    Breast cancer Neg Hx     Outpatient Encounter Medications as of 08/05/2022  Medication Sig   alendronate (  FOSAMAX) 70 MG tablet TAKE 1 TABLET BY MOUTH ONCE A WEEK ON AN EMPTY STOMACH WITH A FULL GLASS OF WATER   amLODipine (NORVASC) 10 MG tablet Take 1 tablet by mouth once daily   aspirin EC 81 MG tablet Take 81 mg by mouth daily. Swallow whole.   carvedilol (COREG) 3.125 MG tablet Take 1 tablet (3.125 mg total) by mouth 2 (two) times daily with a meal.   losartan-hydrochlorothiazide (HYZAAR) 100-25 MG tablet Take 1 tablet by mouth once daily   pregabalin (LYRICA) 50 MG capsule Take 1 capsule (50 mg total) by mouth 2 (two) times daily.   rosuvastatin (CRESTOR) 5 MG tablet Take 1 tablet (5 mg total) by mouth daily.   [DISCONTINUED] levothyroxine (SYNTHROID) 100 MCG tablet Take 1 tablet (100 mcg total) by mouth daily before breakfast.   levothyroxine (SYNTHROID) 112 MCG tablet Take 1 tablet (112 mcg total) by mouth daily before breakfast.   No facility-administered encounter medications on file as of 08/05/2022.    ALLERGIES: Allergies  Allergen Reactions   Atorvastatin Other (See Comments)    Muscle pain   VACCINATION STATUS: Immunization History  Administered Date(s) Administered   Influenza Split 12/29/2010, 11/10/2011   Influenza Whole 01/24/2009, 12/17/2009   Influenza,inj,Quad PF,6+ Mos 12/14/2012, 01/31/2014, 01/09/2015, 02/04/2016, 11/23/2016, 12/06/2017, 12/12/2018, 12/26/2019,  10/14/2021   Influenza-Unspecified 01/09/2021   Janssen (J&J) SARS-COV-2 Vaccination 11/28/2019   Moderna Sars-Covid-2 Vaccination 11/02/2019, 11/30/2019, 04/28/2020, 02/19/2021   PPD Test 12/14/2012, 12/19/2012   Td 12/17/2009   Tdap 05/08/2020   Zoster Recombinat (Shingrix) 02/03/2018, 06/27/2018     HPI   Faith Hood is a patient with the above medical history. she was diagnosed with hypothyroidism at approximate age of 89 years (newly diagnosed in March 2022), which required subsequent initiation of thyroid hormone supplementation. she was given various doses of Levothyroxine since, currently on 88 micrograms. she reports compliance to this medication:  Taking it daily on empty stomach  with water, separated by >30 minutes before breakfast and other medications , and by at least 4 hours from calcium, iron, PPIs, multivitamins.  I reviewed patient's thyroid tests:  Lab Results  Component Value Date   TSH 2.770 07/31/2022   TSH 4.630 (H) 04/07/2022   TSH 7.280 (H) 09/15/2021   TSH 1.640 05/12/2021   TSH 5.260 (H) 03/13/2021   TSH 2.929 12/04/2020   TSH 6.100 (H) 11/13/2020   TSH 5.550 (H) 08/27/2020   TSH 4.440 06/10/2020   TSH 42.500 (H) 05/01/2020   FREET4 1.22 07/31/2022   FREET4 1.06 04/07/2022   FREET4 0.97 09/15/2021   FREET4 1.22 05/12/2021   FREET4 0.86 03/13/2021   FREET4 0.83 11/13/2020   FREET4 0.93 08/27/2020   FREET4 1.04 06/10/2020   FREET4 0.9 06/23/2018   FREET4 0.67 (L) 03/31/2014   She also had thyroid ultrasound in 08/2020 which showed multiple small nodules bilaterally, none of which met criteria for dedicated follow up or biopsy.  Pt denies feeling nodules in neck, hoarseness, dysphagia/odynophagia, SOB with lying down.  she does family history of thyroid disorders in her sister (unsure of which but knows it was surgically removed).  She does report her sister was unable to tolerate the generic form of the hormone and has done well with branded  Synthroid.  No family history of thyroid cancer.  No history of radiation therapy to head or neck.  No recent use of iodine supplements.  Denies use of Biotin containing supplements.  I reviewed her chart and she also  has a history of HTN, HLD, transaminitis.   Review of systems  Constitutional: + Minimally fluctuating body weight,  current Body mass index is 24.99 kg/m. , + fatigue-improving , no subjective hyperthermia, no subjective hypothermia Eyes: no blurry vision, no xerophthalmia ENT: no sore throat, no nodules palpated in throat, no dysphagia/odynophagia, no hoarseness Cardiovascular: no chest pain, no shortness of breath, no palpitations, no leg swelling Respiratory: no cough, no shortness of breath Gastrointestinal: no nausea/vomiting/diarrhea Musculoskeletal: + generalized muscle/joint aches Skin: no rashes, no hyperemia Neurological: no tremors, + numbness/tingling/burning pain to bilateral hands and feet (having nerve conduction studies soon), no dizziness Psychiatric: no depression, no anxiety   Objective:   Objective     BP 129/80 (BP Location: Left Arm, Patient Position: Sitting, Cuff Size: Large)   Pulse 74   Ht 5\' 4"  (1.626 m)   Wt 145 lb 9.6 oz (66 kg)   BMI 24.99 kg/m  Wt Readings from Last 3 Encounters:  08/05/22 145 lb 9.6 oz (66 kg)  07/02/22 146 lb (66.2 kg)  06/11/22 148 lb (67.1 kg)    BP Readings from Last 3 Encounters:  08/05/22 129/80  07/02/22 138/76  06/11/22 (!) 160/90      Physical Exam- Limited  Constitutional:  Body mass index is 24.99 kg/m. , not in acute distress, normal state of mind Eyes:  EOMI, no exophthalmos Musculoskeletal: no gross deformities, strength intact in all four extremities, no gross restriction of joint movements Skin:  no rashes, no hyperemia Neurological: no tremor with outstretched hands   CMP ( most recent) CMP     Component Value Date/Time   NA 143 06/02/2022 1557   K 4.0 06/02/2022 1557   CL 101  06/02/2022 1557   CO2 24 06/02/2022 1557   GLUCOSE 84 06/02/2022 1557   GLUCOSE 89 12/04/2020 1428   BUN 18 06/02/2022 1557   CREATININE 0.92 06/02/2022 1557   CREATININE 0.79 11/28/2019 1449   CALCIUM 9.9 06/02/2022 1557   PROT 7.7 06/02/2022 1557   ALBUMIN 4.4 06/02/2022 1557   AST 15 06/02/2022 1557   ALT 13 06/02/2022 1557   ALKPHOS 74 06/02/2022 1557   BILITOT 0.4 06/02/2022 1557   GFRNONAA >60 12/04/2020 1428   GFRNONAA 80 11/28/2019 1449   GFRAA 93 11/28/2019 1449     Diabetic Labs (most recent): Lab Results  Component Value Date   HGBA1C 5.5 06/23/2018   HGBA1C 5.4 05/15/2016   HGBA1C 5.4 02/04/2016     Lipid Panel ( most recent) Lipid Panel     Component Value Date/Time   CHOL 151 06/02/2022 1557   TRIG 69 06/02/2022 1557   HDL 47 06/02/2022 1557   CHOLHDL 3.2 06/02/2022 1557   CHOLHDL 3.5 12/04/2020 1427   VLDL 13 12/04/2020 1427   LDLCALC 90 06/02/2022 1557   LDLCALC 143 (H) 09/14/2019 1340   LABVLDL 14 06/02/2022 1557       Lab Results  Component Value Date   TSH 2.770 07/31/2022   TSH 4.630 (H) 04/07/2022   TSH 7.280 (H) 09/15/2021   TSH 1.640 05/12/2021   TSH 5.260 (H) 03/13/2021   TSH 2.929 12/04/2020   TSH 6.100 (H) 11/13/2020   TSH 5.550 (H) 08/27/2020   TSH 4.440 06/10/2020   TSH 42.500 (H) 05/01/2020   FREET4 1.22 07/31/2022   FREET4 1.06 04/07/2022   FREET4 0.97 09/15/2021   FREET4 1.22 05/12/2021   FREET4 0.86 03/13/2021   FREET4 0.83 11/13/2020   FREET4 0.93 08/27/2020  FREET4 1.04 06/10/2020   FREET4 0.9 06/23/2018   FREET4 0.67 (L) 03/31/2014    Thyroid US from 09/06/20 CLINICAL DATA:  Hypothyroidism   EXAM: THYROID ULTRASOUND   TECHNIQUE: Ultrasound examination of the thyroid gland and adjacent soft tissues was performed.   COMPARISON:  07/19/2018 and previous back to 04/09/2014   FINDINGS: Parenchymal Echotexture: Moderately heterogenous   Isthmus: 1.4 cm thickness, previously 1.2   Right lobe: 6.1 x 1.2 x  2.3 cm, previously 4.7 x 2.4 x 2.7   Left lobe: 5.9 x 3 x 2.6 cm, previously 5 x 2.5 x 2.3   _________________________________________________________   Estimated total number of nodules >/= 1 cm: 3   Number of spongiform nodules >/=  2 cm not described below (TR1): 0   Number of mixed cystic and solid nodules >/= 1.5 cm not described below (TR2): 0   _________________________________________________________   Nodule # 1:   Prior biopsy: No   Location: Right; superior   Maximum size: 1.6 cm; Other 2 dimensions: 1.1 x 1.4 cm, previously, 1.4 x 1.2 x 1.4 cm   Composition: solid/almost completely solid (2)   Echogenicity: hyperechoic (1)   Shape: not taller-than-wide (0)   Margins: smooth (0)   Echogenic foci: none (0)   ACR TI-RADS total points: 3.   ACR TI-RADS risk category:  TR3 (3 points).   Significant change in size (>/= 20% in two dimensions and minimal increase of 2 mm): No   Change in features: No   Change in ACR TI-RADS risk category: No   ACR TI-RADS recommendations:   *Given size (>/= 1.5 - 2.4 cm) and appearance, a follow-up ultrasound in 1 year should be considered based on TI-RADS criteria.   _________________________________________________________   Nodule # 2: 0.6 cm calcification, mid left, stable; This nodule does NOT meet TI-RADS criteria for biopsy or dedicated follow-up.   _________________________________________________________   Nodule # 3:   Location: Left; superior   Maximum size: 1.2 cm; Other 2 dimensions: 0.8 x 0.9 cm   Composition: solid/almost completely solid (2)   Echogenicity: hyperechoic (1)   Shape: not taller-than-wide (0)   Margins: smooth (0)   Echogenic foci: none (0)   ACR TI-RADS total points: 3.   ACR TI-RADS risk category: TR3 (3 points).   ACR TI-RADS recommendations:   Given size (<1.4 cm) and appearance, this nodule does NOT meet TI-RADS criteria for biopsy or dedicated follow-up.    _________________________________________________________   Nodule # 4:   Location: Isthmus; right of midline   Maximum size: 1.2 cm; Other 2 dimensions: 1 x 0.8 cm   Composition: solid/almost completely solid (2)   Echogenicity: isoechoic (1)   Shape: not taller-than-wide (0)   Margins: ill-defined (0)   Echogenic foci: none (0)   ACR TI-RADS total points: 3.   ACR TI-RADS risk category: TR3 (3 points).   ACR TI-RADS recommendations:   Given size (<1.4 cm) and appearance, this nodule does NOT meet TI-RADS criteria for biopsy or dedicated follow-up.   IMPRESSION: 1. Enlarged heterogenous thyroid with nodules as above. None meet criteria for biopsy. 2. Recommend annual/biennial ultrasound follow-up of right nodule as above, until stability x5 years confirmed.   The above is in keeping with the ACR TI-RADS recommendations - J Am Coll Radiol 2017;14:587-595.     Electronically Signed   By: Corlis Leak M.D.   On: 09/07/2020 08:13 --------------------------------------------------------------------------------------------------------- Thyroid US from 06/02/21 CLINICAL DATA:  Goiter.   EXAM: THYROID ULTRASOUND   TECHNIQUE:  Ultrasound examination of the thyroid gland and adjacent soft tissues was performed.   COMPARISON:  09/06/2020, 07/19/2018   FINDINGS: Parenchymal Echotexture: Moderately heterogenous   Isthmus: 1.5 cm, previously 1.4 cm   Right lobe: 5.1 x 2.8 x 2.3 cm, previously 6.1 x 1.2 x 2.3 cm   Left lobe: 4.6 x 2.5 x 2.7 cm, previously 5.9 x 3.0 x 2.6 cm   _________________________________________________________   Estimated total number of nodules >/= 1 cm: 3   Number of spongiform nodules >/=  2 cm not described below (TR1): 0   Number of mixed cystic and solid nodules >/= 1.5 cm not described below (TR2): 0   _________________________________________________________   Nodule # 1:   Prior biopsy: No   Location: Isthmus; Inferior    Maximum size: 1.4 cm; Other 2 dimensions: 1.0 x 0.9 cm, previously, 1.2 x 1.0 x 0.8 cm   Composition: solid/almost completely solid (2)   Echogenicity: hyperechoic (1)   Shape: not taller-than-wide (0)   Margins: ill-defined (0)   Echogenic foci: none (0)   ACR TI-RADS total points: 3.   ACR TI-RADS risk category:  TR3 (3 points).   Significant change in size (>/= 20% in two dimensions and minimal increase of 2 mm): No   Change in features: No   Change in ACR TI-RADS risk category: No   ACR TI-RADS recommendations:   Given size (<1.5 cm) and appearance, this nodule does NOT meet TI-RADS criteria for biopsy or dedicated follow-up.   _________________________________________________________   Nodule # 2 (previously labeled 1):   Prior biopsy: No   Location: Right; Superior   Maximum size: 1.5 cm; Other 2 dimensions: 1.3 x 1.2 cm, previously, 1.6 x 1.1 x 1.4 cm   Composition: solid/almost completely solid (2)   Echogenicity: hyperechoic (1)   Shape: not taller-than-wide (0)   Margins: smooth (0)   Echogenic foci: none (0)   ACR TI-RADS total points: 3.   ACR TI-RADS risk category:  TR3 (3 points).   Significant change in size (>/= 20% in two dimensions and minimal increase of 2 mm): No   Change in features: No   Change in ACR TI-RADS risk category: No   ACR TI-RADS recommendations:   *Given size (>/= 1.5 - 2.4 cm) and appearance, a follow-up ultrasound in 1 year should be considered based on TI-RADS criteria.   _________________________________________________________   Nodule # 3:   Prior biopsy: No   Location: Left; Superior   Maximum size: 1.0 cm; Other 2 dimensions: 0.9 x 0.8 cm, previously, 1.2 x 0.8 x 0.9 cm   Composition: solid/almost completely solid (2)   Echogenicity: hyperechoic (1)   Shape: not taller-than-wide (0)   Margins: smooth (0)   Echogenic foci: none (0)   ACR TI-RADS total points: 3.   ACR TI-RADS risk  category:  TR3 (3 points).   Significant change in size (>/= 20% in two dimensions and minimal increase of 2 mm): No   Change in features: No   Change in ACR TI-RADS risk category: No   ACR TI-RADS recommendations:   Given size (<1.4 cm) and appearance, this nodule does NOT meet TI-RADS criteria for biopsy or dedicated follow-up.   _________________________________________________________   IMPRESSION: 1. Similar appearing enlarged, heterogenous thyroid gland. 2. Similar appearance of previously visualized right superior solid thyroid nodule (labeled 2, 1.5 cm, previously 1.6 cm) which again meets criteria (TI-RADS category 3) for 1 year ultrasound surveillance. This study marks 3 years stability. 3. The remaining  visualized thyroid nodules appear benign and do not warrant additional follow-up.   The above is in keeping with the ACR TI-RADS recommendations - J Am Coll Radiol 2017;14:587-595.   Marliss Coots, MD   [facility]   [facility]     Electronically Signed   By: Marliss Coots M.D.   On: 06/02/2021 14:28   Latest Reference Range & Units 03/13/21 16:03 05/12/21 15:52 09/15/21 11:25 04/07/22 16:11 07/31/22 09:58  TSH 0.450 - 4.500 uIU/mL 5.260 (H) 1.640 7.280 (H) 4.630 (H) 2.770  T4,Free(Direct) 0.82 - 1.77 ng/dL 1.61 0.96 0.45 4.09 8.11  (H): Data is abnormally high  Assessment & Plan:   ASSESSMENT / PLAN:  1. Hypothyroidism- r/t Hashimoto's thyroiditis   Patient with relatively new hypothyroidism, on levothyroxine therapy. Her antibody testing confirms suspicion of autoimmune thyroid dysfunction.    Her previsit thyroid function tests are consistent with slight under-replacement.   She does have symptoms of under-active thyroid therefore will increase her Levothyroxine to 112 mcg po daily before breakfast.  Will recheck TFTs prior to next visit and adjust dose further if needed.  - We discussed about correct  intake of levothyroxine, at fasting, with water, separated by at least 30 minutes from breakfast, and separated by more than 4 hours from calcium, iron, multivitamins, acid reflux medications (PPIs). -Patient is made aware of the fact that thyroid hormone replacement is needed for life, dose to be adjusted by periodic monitoring of thyroid function tests.  2. Multinodular thyroid Multiple thyroid nodules recommending follow up with surveillance ultrasound in 1 year (around 05/2023).       I spent  21  minutes in the care of the patient today including review of labs from Thyroid Function, CMP, and other relevant labs ; imaging/biopsy records (current and previous including abstractions from other facilities); face-to-face time discussing  her lab results and symptoms, medications doses, her options of short and long term treatment based on the latest standards of care / guidelines;   and documenting the encounter.  Channie P Noblet  participated in the discussions, expressed understanding, and voiced agreement with the above plans.  All questions were answered to her satisfaction. she is encouraged to contact clinic should she have any questions or concerns prior to her return visit.   FOLLOW UP PLAN:  Return in about 3 months (around 11/05/2022) for Thyroid follow up, Previsit labs.  Ronny Bacon, [facility] [address] [place], [place] [address] Phone: [phone] Fax: [phone]  08/05/2022, 3:00 PM

## 2022-08-27 IMAGING — MG MM DIGITAL SCREENING BILAT W/ TOMO AND CAD
8 series · 8 of 24 positions shown · non-contrast
Comparison: Previous exam(s).

CLINICAL DATA: Screening.

EXAM:
DIGITAL SCREENING BILATERAL MAMMOGRAM WITH TOMOSYNTHESIS AND CAD
TECHNIQUE: Bilateral screening digital craniocaudal and mediolateral oblique
mammograms were obtained. Bilateral screening digital breast
tomosynthesis was performed. The images were evaluated with
computer-aided detection.

[L MLO synth-2D]
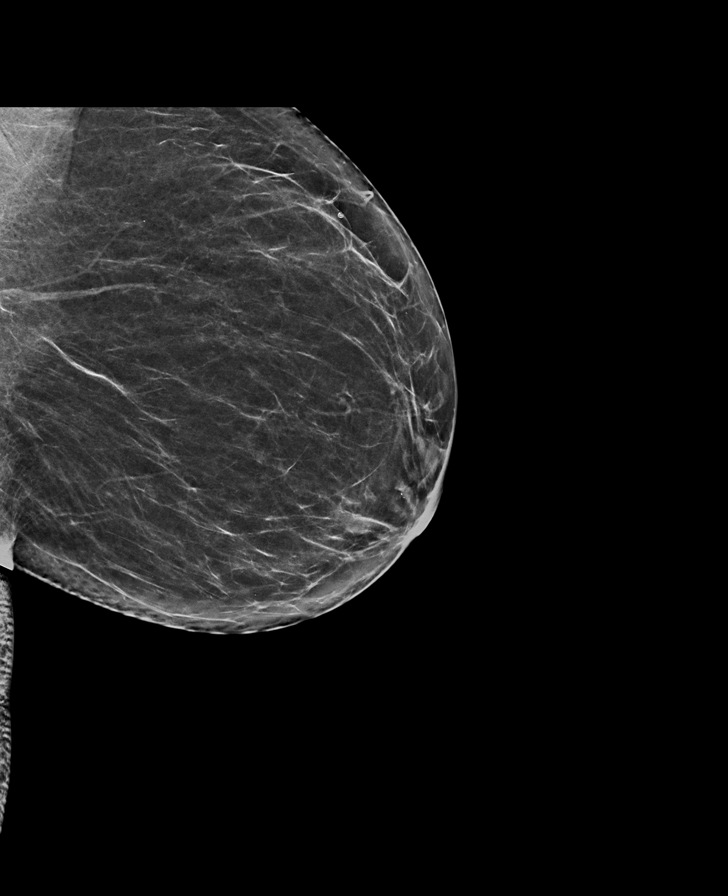

[R MLO synth-2D]
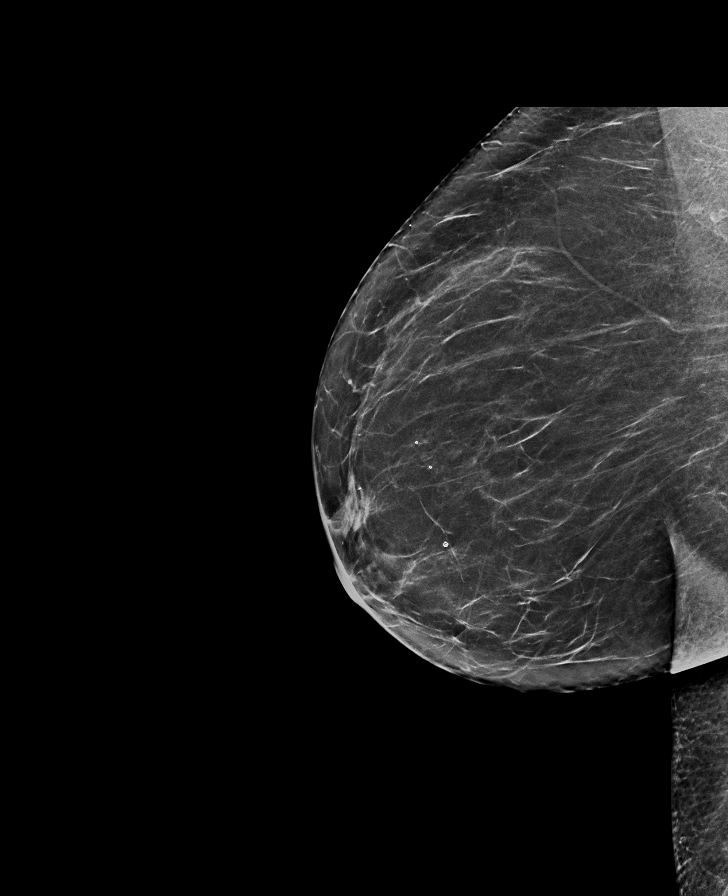

[L CC synth-2D]
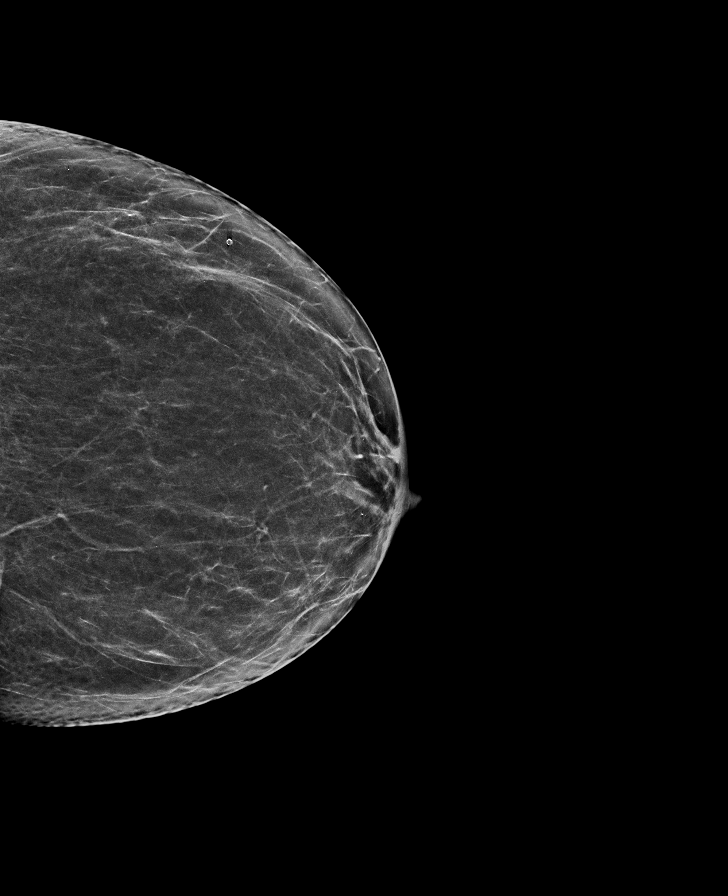

[R CC synth-2D]
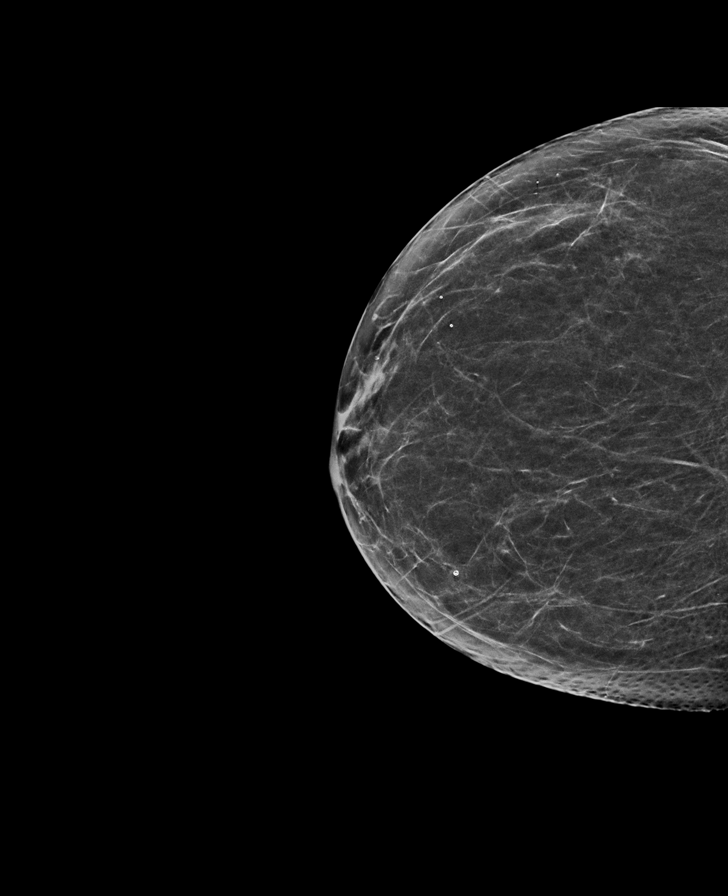

[R MLO tomo · tomo slice 35/70.0]
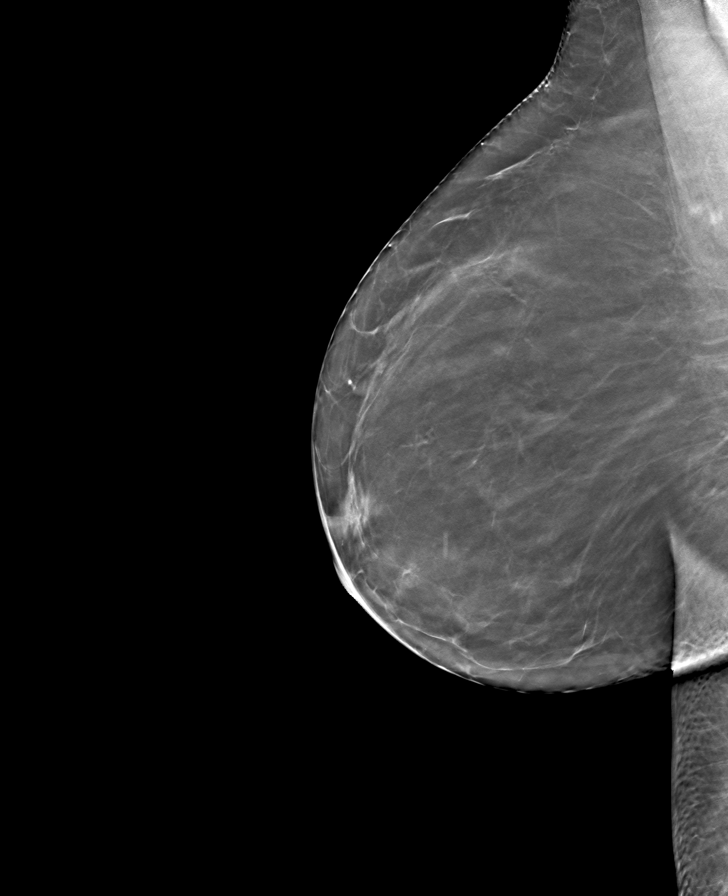

[R CC tomo · tomo slice 29/58.0]
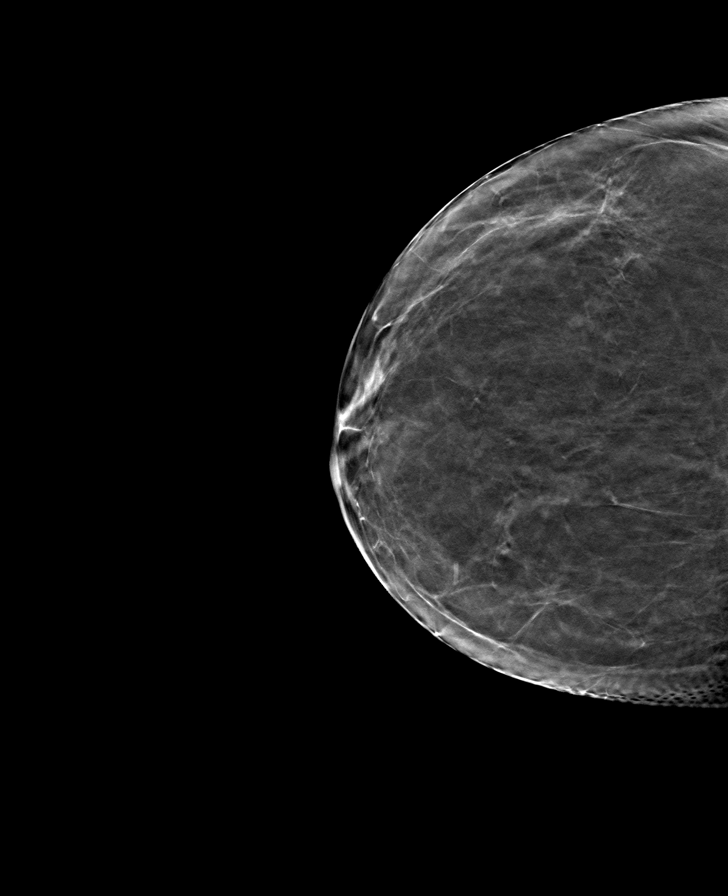

[L MLO tomo · tomo slice 33/66.0]
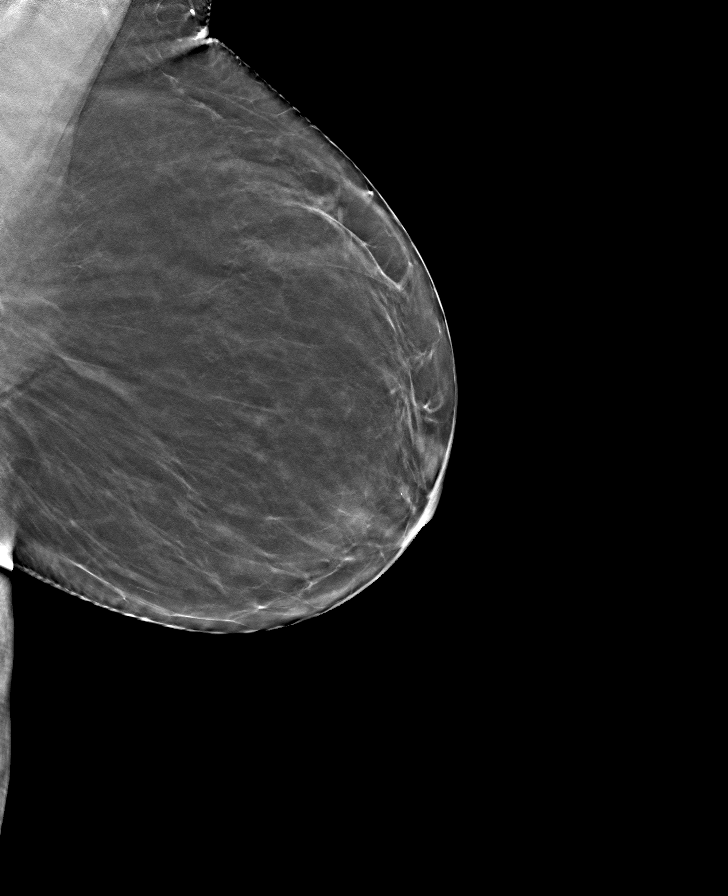

[L CC tomo · tomo slice 33/64.0]
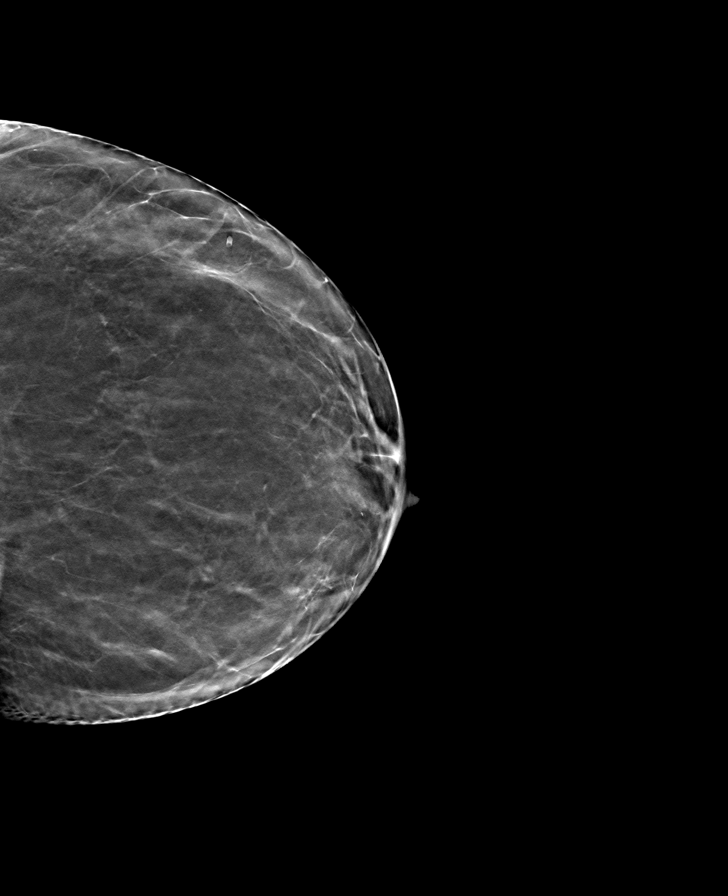

[8 of 24 positions shown; findings below may reference images not displayed]

ACR Breast Density Category b: There are scattered areas of
fibroglandular density.
FINDINGS: There are no findings suspicious for malignancy.
IMPRESSION: No mammographic evidence of malignancy. A result letter of this
screening mammogram will be mailed directly to the patient.

RECOMMENDATION:
Screening mammogram in one year. (Code:51-O-LD2)

BI-RADS CATEGORY  1: Negative.

## 2022-08-31 ENCOUNTER — Ambulatory Visit: Payer: Medicare PPO | Admitting: Neurology

## 2022-08-31 ENCOUNTER — Ambulatory Visit: Payer: Self-pay | Admitting: Neurology

## 2022-08-31 VITALS — Ht 64.0 in

## 2022-08-31 DIAGNOSIS — R200 Anesthesia of skin: Secondary | ICD-10-CM | POA: Diagnosis not present

## 2022-08-31 DIAGNOSIS — Z0289 Encounter for other administrative examinations: Secondary | ICD-10-CM

## 2022-08-31 NOTE — Progress Notes (Addendum)
Full Name: Faith Hood Gender: Female MRN #: [id number] Date of Birth: 18-Jan-1956    Visit Date: 08/31/2022 13:06 Age: 67 Years Examining Physician: Dr. Naomie Dean Referring Physician: Dr. Windell Norfolk Height: 5 feet 4 inch  History: Patient with numbness mostly at night in digits 1-3. In her feet she has numbness in the toes, she has a history of polyarteritis nodosum (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PAN) is a rare, systemic vasculitis that can affect medium and small-sized muscular arteries, including those in the nervous system. Peripheral neuropathy is a common and early symptom of PAN, affecting 50-75% of patients)  Summary: EMG/NCS performed on bilateral upper extremities and left lower extremity.:   The right Median 2nd Digit orthodromic sensory nerve showed prolonged distal peak latency (3.8  ms, N<3.4)  The right median/ulnar (palm) comparison nerve showed prolonged distal peak latency (Median Palm, 2.6 ms, N<2.2) and abnormal peak latency difference (Median Palm-Ulnar Palm, 0.9 ms, N<0.4) with a relative median delay.  The left median/ulnar (palm) comparison nerve showed prolonged distal peak latency (Median Palm, 2.5 ms, N<2.2) and abnormal peak latency difference (Median Palm-Ulnar Palm, 0.5 ms, N<0.4) with a relative median delay.   All remaining nerves (as indicated in the following tables) were within normal limits.   All remaining muscles (as indicated in the following tables) were within normal limits.       Conclusion: There is electrophysiologic evidence of mild right > left Carpal Tunnel Syndrome.  No suggestion of large-fiber polyneuropathy or radiculopathy.  ------------------------------- Naomie Dean M.D.  [facility] [address], [address] [place], [place] [address] Tel: [phone] Fax: [phone]  Verbal informed consent was obtained from the patient, patient was informed of potential risk of procedure, including bruising, bleeding,  hematoma formation, infection, muscle weakness, muscle pain, numbness, among others.        MNC    Nerve / Sites Muscle Latency Ref. Amplitude Ref. Rel Amp Segments Distance Velocity Ref. Area    ms ms mV mV %  cm m/s m/s mVms  L Median - APB     Wrist APB 3.5 ?4.4 6.5 ?4.0 100 Wrist - APB 7   30.2     Upper arm APB 8.5  6.7  103 Upper arm - Wrist 27 54 ?49 29.7  R Median - APB     Wrist APB 4.2 ?4.4 5.6 ?4.0 100 Wrist - APB 7   24.7     Upper arm APB 9.2  5.2  93 Upper arm - Wrist 27 54 ?49 20.5  L Ulnar - ADM     Wrist ADM 2.6 ?3.3 8.1 ?6.0 100 Wrist - ADM 7   35.6     B.Elbow ADM 4.3  7.2  88.7 B.Elbow - Wrist 11 64 ?49 33.4     A.Elbow ADM 7.3  7.1  98.7 A.Elbow - B.Elbow 17 56 ?49 34.8  R Ulnar - ADM     Wrist ADM 2.8 ?3.3 8.5 ?6.0 100 Wrist - ADM 7   36.6     B.Elbow ADM 4.6  8.2  96.6 B.Elbow - Wrist 12 63 ?49 36.1     A.Elbow ADM 7.5  7.8  94.2 A.Elbow - B.Elbow 16 57 ?49 34.7  L Peroneal - EDB     Ankle EDB 5.5 ?6.5 2.9 ?2.0 100 Ankle - EDB 9   15.5     Fib head EDB 11.9  2.5  84.6 Fib head - Ankle 30.6 48 ?44  Pop fossa EDB 13.8  4.0  162 Pop fossa - Fib head 10 52 ?44 19.4         Pop fossa - Ankle      L Tibial - AH     Ankle AH 4.3 ?5.8 8.7 ?4.0 100 Ankle - AH 9   30.2     Pop fossa AH 12.8  8.6  99.8 Pop fossa - Ankle 38 45 ?41 31.9                 SNC    Nerve / Sites Rec. Site Peak Lat Ref.  Amp Ref. Segments Distance Peak Diff Ref.    ms ms V V  cm ms ms  L Radial - Anatomical snuff box (Forearm)     Forearm Wrist 2.4 ?2.9 56 ?15 Forearm - Wrist 10    R Radial - Anatomical snuff box (Forearm)     Forearm Wrist 2.3 ?2.9 40 ?15 Forearm - Wrist 10    L Sural - Ankle (Calf)     Calf Ankle 3.5 ?4.4 9 ?6 Calf - Ankle 14    R Sural - Ankle (Calf)     Calf Ankle 4.0 ?4.4 10 ?6 Calf - Ankle 14    L Superficial peroneal - Ankle     Lat leg Ankle 4.2 ?4.4 8 ?6 Lat leg - Ankle 14    R Superficial peroneal - Ankle     Lat leg Ankle 4.1 ?4.4 7 ?6 Lat leg - Ankle  14    L Median, Ulnar - Transcarpal comparison     Median Palm Wrist 2.5 ?2.2 67 ?35 Median Palm - Wrist 8       Ulnar Palm Wrist 2.0 ?2.2 24 ?12 Ulnar Palm - Wrist 8          Median Palm - Ulnar Palm  0.5 ?0.4  R Median, Ulnar - Transcarpal comparison     Median Palm Wrist 2.6 ?2.2 78 ?35 Median Palm - Wrist 8       Ulnar Palm Wrist 1.7 ?2.2 45 ?12 Ulnar Palm - Wrist 8          Median Palm - Ulnar Palm  0.9 ?0.4  L Median - Orthodromic (Dig II, Mid palm)     Dig II Wrist 3.3 ?3.4 29 ?10 Dig II - Wrist 13    R Median - Orthodromic (Dig II, Mid palm)     Dig II Wrist 3.8 ?3.4 25 ?10 Dig II - Wrist 13    L Ulnar - Orthodromic, (Dig V, Mid palm)     Dig V Wrist 2.9 ?3.1 18 ?5 Dig V - Wrist 11    R Ulnar - Orthodromic, (Dig V, Mid palm)     Dig V Wrist 2.9 ?3.1 24 ?5 Dig V - Wrist 11                               F  Wave    Nerve F Lat Ref.   ms ms  L Ulnar - ADM 25.2 ?32.0  R Ulnar - ADM 26.7 ?32.0         EMG Summary Table    Spontaneous MUAP Recruitment  Muscle IA Fib PSW Fasc Other Amp Dur. Poly Pattern  L. Vastus medialis all left None None None _______ Normal Normal Normal Normal  L. Tibialis anterior Normal None None None _______ Normal Normal Normal Normal  L. Peroneus longus Normal None None None _______ Normal Normal Normal Normal  L. Gastrocnemius (Medial head) Normal None None None _______ Normal Normal Normal Normal  L. Extensor hallucis longus Normal None None None _______ Normal Normal Normal Normal  L. Abductor hallucis Normal None None None _______ Normal Normal Normal Normal  L. Deltoid Normal None None None _______ Normal Normal Normal Normal  R. Deltoid Normal None None None _______ Normal Normal Normal Normal  L. Triceps brachii Normal None None None _______ Normal Normal Normal Normal  R. Triceps brachii Normal None None None _______ Normal Normal Normal Normal  L. Pronator teres Normal None None None _______ Normal Normal Normal Normal  R. Pronator teres  Normal None None None _______ Normal Normal Normal Normal  L. First dorsal interosseous Normal None None None _______ Normal Normal Normal Normal  R. First dorsal interosseous Normal None None None _______ Normal Normal Normal Normal  L. Opponens pollicis Normal None None None _______ Normal Normal Normal Normal  R. Opponens pollicis Normal None None None _______ Normal Normal Normal Normal  L. Biceps femoris (long head) Normal None None None _______ Normal Normal Normal Normal  Bilat. Cervical paraspinals (low) Normal None None None _______ Normal Normal Normal Normal  L. Lumbar paraspinals (low) Normal None None None _______ Normal Normal Normal Normal  L. Gastrocnemius (Medial head) Normal None None None _______ Normal Normal Normal Normal  L. Gluteus maximus Normal None None None _______ Normal Normal Normal Normal  L. Gluteus medius Normal None None None _______ Normal Normal Normal Normal

## 2022-09-04 ENCOUNTER — Other Ambulatory Visit: Payer: Self-pay

## 2022-09-04 MED ORDER — ALENDRONATE SODIUM 70 MG PO TABS: ORAL_TABLET | ORAL | 0 refills | Status: DC

## 2022-09-07 NOTE — Procedures (Signed)
Full Name: Faith Hood Gender: Female MRN #: [id number] Date of Birth: 14-Jun-1955    Visit Date: 08/31/2022 13:06 Age: 68 Years Examining Physician: Dr. Naomie Dean Referring Physician: Dr. Windell Norfolk Height: 5 feet 4 inch  History: Patient with numbness mostly at night in digits 1-3. In her feet she has numbness in the toes, she has a history of polyarteritis nodosum (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PAN) is a rare, systemic vasculitis that can affect medium and small-sized muscular arteries, including those in the nervous system. Peripheral neuropathy is a common and early symptom of PAN, affecting 50-75% of patients)  Summary: EMG/NCS performed on bilateral upper extremities and left lower extremity.:   The right Median 2nd Digit orthodromic sensory nerve showed prolonged distal peak latency (3.8  ms, N<3.4)  The right median/ulnar (palm) comparison nerve showed prolonged distal peak latency (Median Palm, 2.6 ms, N<2.2) and abnormal peak latency difference (Median Palm-Ulnar Palm, 0.9 ms, N<0.4) with a relative median delay.   The left median/ulnar (palm) comparison nerve showed prolonged distal peak latency (Median Palm, 2.5 ms, N<2.2) and abnormal peak latency difference (Median Palm-Ulnar Palm, 0.5 ms, N<0.4) with a relative median delay.    Conclusion: There is electrophysiologic evidence of mild right > left Carpal Tunnel Syndrome.  No suggestion of large-fiber polyneuropathy or radiculopathy.  ------------------------------- Naomie Dean M.D.  [facility] [address] [place], [place] [address] Tel: [phone] Fax: [phone]  Verbal informed consent was obtained from the patient, patient was informed of potential risk of procedure, including bruising, bleeding, hematoma formation, infection, muscle weakness, muscle pain, numbness, among others.        MNC    Nerve / Sites Muscle Latency Ref. Amplitude Ref. Rel Amp Segments Distance Velocity  Ref. Area    ms ms mV mV %  cm m/s m/s mVms  L Median - APB     Wrist APB 3.5 ?4.4 6.5 ?4.0 100 Wrist - APB 7   30.2     Upper arm APB 8.5  6.7  103 Upper arm - Wrist 27 54 ?49 29.7  R Median - APB     Wrist APB 4.2 ?4.4 5.6 ?4.0 100 Wrist - APB 7   24.7     Upper arm APB 9.2  5.2  93 Upper arm - Wrist 27 54 ?49 20.5  L Ulnar - ADM     Wrist ADM 2.6 ?3.3 8.1 ?6.0 100 Wrist - ADM 7   35.6     B.Elbow ADM 4.3  7.2  88.7 B.Elbow - Wrist 11 64 ?49 33.4     A.Elbow ADM 7.3  7.1  98.7 A.Elbow - B.Elbow 17 56 ?49 34.8  R Ulnar - ADM     Wrist ADM 2.8 ?3.3 8.5 ?6.0 100 Wrist - ADM 7   36.6     B.Elbow ADM 4.6  8.2  96.6 B.Elbow - Wrist 12 63 ?49 36.1     A.Elbow ADM 7.5  7.8  94.2 A.Elbow - B.Elbow 16 57 ?49 34.7  L Peroneal - EDB     Ankle EDB 5.5 ?6.5 2.9 ?2.0 100 Ankle - EDB 9   15.5     Fib head EDB 11.9  2.5  84.6 Fib head - Ankle 30.6 48 ?44      Pop fossa EDB 13.8  4.0  162 Pop fossa - Fib head 10 52 ?44 19.4         Pop fossa - Ankle  L Tibial - AH     Ankle AH 4.3 ?5.8 8.7 ?4.0 100 Ankle - AH 9   30.2     Pop fossa AH 12.8  8.6  99.8 Pop fossa - Ankle 38 45 ?41 31.9                 SNC    Nerve / Sites Rec. Site Peak Lat Ref.  Amp Ref. Segments Distance Peak Diff Ref.    ms ms V V  cm ms ms  L Radial - Anatomical snuff box (Forearm)     Forearm Wrist 2.4 ?2.9 56 ?15 Forearm - Wrist 10    R Radial - Anatomical snuff box (Forearm)     Forearm Wrist 2.3 ?2.9 40 ?15 Forearm - Wrist 10    L Sural - Ankle (Calf)     Calf Ankle 3.5 ?4.4 9 ?6 Calf - Ankle 14    R Sural - Ankle (Calf)     Calf Ankle 4.0 ?4.4 10 ?6 Calf - Ankle 14    L Superficial peroneal - Ankle     Lat leg Ankle 4.2 ?4.4 8 ?6 Lat leg - Ankle 14    R Superficial peroneal - Ankle     Lat leg Ankle 4.1 ?4.4 7 ?6 Lat leg - Ankle 14    L Median, Ulnar - Transcarpal comparison     Median Palm Wrist 2.5 ?2.2 67 ?35 Median Palm - Wrist 8       Ulnar Palm Wrist 2.0 ?2.2 24 ?12 Ulnar Palm - Wrist 8          Median  Palm - Ulnar Palm  0.5 ?0.4  R Median, Ulnar - Transcarpal comparison     Median Palm Wrist 2.6 ?2.2 78 ?35 Median Palm - Wrist 8       Ulnar Palm Wrist 1.7 ?2.2 45 ?12 Ulnar Palm - Wrist 8          Median Palm - Ulnar Palm  0.9 ?0.4  L Median - Orthodromic (Dig II, Mid palm)     Dig II Wrist 3.3 ?3.4 29 ?10 Dig II - Wrist 13    R Median - Orthodromic (Dig II, Mid palm)     Dig II Wrist 3.8 ?3.4 25 ?10 Dig II - Wrist 13    L Ulnar - Orthodromic, (Dig V, Mid palm)     Dig V Wrist 2.9 ?3.1 18 ?5 Dig V - Wrist 11    R Ulnar - Orthodromic, (Dig V, Mid palm)     Dig V Wrist 2.9 ?3.1 24 ?5 Dig V - Wrist 66                               F  Wave    Nerve F Lat Ref.   ms ms  L Ulnar - ADM 25.2 ?32.0  R Ulnar - ADM 26.7 ?32.0         EMG Summary Table    Spontaneous MUAP Recruitment  Muscle IA Fib PSW Fasc Other Amp Dur. Poly Pattern  L. Vastus medialis all left None None None _______ Normal Normal Normal Normal  L. Tibialis anterior Normal None None None _______ Normal Normal Normal Normal  L. Peroneus longus Normal None None None _______ Normal Normal Normal Normal  L. Gastrocnemius (Medial head) Normal None None None _______ Normal Normal Normal Normal  L. Extensor hallucis longus Normal None None  None _______ Normal Normal Normal Normal  L. Abductor hallucis Normal None None None _______ Normal Normal Normal Normal  L. Deltoid Normal None None None _______ Normal Normal Normal Normal  R. Deltoid Normal None None None _______ Normal Normal Normal Normal  L. Triceps brachii Normal None None None _______ Normal Normal Normal Normal  R. Triceps brachii Normal None None None _______ Normal Normal Normal Normal  L. Pronator teres Normal None None None _______ Normal Normal Normal Normal  R. Pronator teres Normal None None None _______ Normal Normal Normal Normal  L. First dorsal interosseous Normal None None None _______ Normal Normal Normal Normal  R. First dorsal interosseous Normal None  None None _______ Normal Normal Normal Normal  L. Opponens pollicis Normal None None None _______ Normal Normal Normal Normal  R. Opponens pollicis Normal None None None _______ Normal Normal Normal Normal  L. Biceps femoris (long head) Normal None None None _______ Normal Normal Normal Normal  Bilat. Cervical paraspinals (low) Normal None None None _______ Normal Normal Normal Normal  L. Lumbar paraspinals (low) Normal None None None _______ Normal Normal Normal Normal  L. Gastrocnemius (Medial head) Normal None None None _______ Normal Normal Normal Normal  L. Gluteus maximus Normal None None None _______ Normal Normal Normal Normal  L. Gluteus medius Normal None None None _______ Normal Normal Normal Normal

## 2022-09-16 ENCOUNTER — Ambulatory Visit (INDEPENDENT_AMBULATORY_CARE_PROVIDER_SITE_OTHER): Payer: Medicare PPO | Admitting: Cardiovascular Disease

## 2022-09-16 ENCOUNTER — Encounter (HOSPITAL_BASED_OUTPATIENT_CLINIC_OR_DEPARTMENT_OTHER): Payer: Self-pay | Admitting: Cardiovascular Disease

## 2022-09-16 VITALS — BP 136/84 | HR 57 | Ht 64.0 in | Wt 150.0 lb

## 2022-09-16 DIAGNOSIS — I10 Essential (primary) hypertension: Secondary | ICD-10-CM | POA: Diagnosis not present

## 2022-09-16 DIAGNOSIS — I679 Cerebrovascular disease, unspecified: Secondary | ICD-10-CM | POA: Diagnosis not present

## 2022-09-16 DIAGNOSIS — I1A0 Resistant hypertension: Secondary | ICD-10-CM | POA: Diagnosis not present

## 2022-09-16 DIAGNOSIS — Z006 Encounter for examination for normal comparison and control in clinical research program: Secondary | ICD-10-CM

## 2022-09-16 MED ORDER — REPATHA SURECLICK 140 MG/ML ~~LOC~~ SOAJ: 140.0000 mg | SUBCUTANEOUS | Status: DC

## 2022-09-16 MED ORDER — REPATHA SURECLICK 140 MG/ML ~~LOC~~ SOAJ: 140.0000 mg | SUBCUTANEOUS | 3 refills | Status: DC

## 2022-09-16 NOTE — Research (Signed)
  Subject Name: Faith Hood  Faith Hood met inclusion and exclusion criteria for the Virtual Care and Social Determinant Interventions for the management of hypertension trial.  The informed consent form, study requirements and expectations were reviewed with the subject by Dr. Duke Salvia and myself. The subject was given the opportunity to read the consent and ask questions. The subject verbalized understanding of the trial requirements.  All questions were addressed prior to the signing of the consent form. The subject agreed to participate in the trial and signed the informed consent. The informed consent was obtained prior to performance of any protocol-specific procedures for the subject.  A copy of the signed informed consent was given to the subject and a copy was placed in the subject's medical record.  Faith Hood was randomized to Group 2.

## 2022-09-16 NOTE — Progress Notes (Signed)
Advanced [facility]pertension Clinic Initial Assessment:    Date:  09/16/2022   ID:  Faith Hood, DOB 1955-09-10, MRN [id number]  PCP:  Kerri Perches, MD  Cardiologist:  Little Ishikawa, MD  Nephrologist:  Referring MD: Kerri Perches, MD   CC: Hypertension  History of Present Illness:    Faith Hood is a 67 y.o. female with a hx of hypertension, hyperlipidemia, hypothyroidism secondary to Hashimoto's thyroiditis, arthritis, osteoporosis on Fosamax, polyarteritis nervosa, here to establish care in the [facility].   She saw Dr. Lodema Hong 06/11/2022 and her blood pressure was 160/90. She has resistant hypertension despite compliance of amlodipine 10 mg daily, losartan-HCTZ 100-25 mg daily, and spironolactone. Spironolactone was stopped at that visit and she was prescribed carvedilol 3.125 mg daily. Follows with endocrinology for hypothyroidism, on levothyroxine 100 mcg. Has also been seen by neurology 06/2022 for progressive worsening of LE numbness. She was advised to restart Lyrica if her pain worsened.   She previously saw Dr. Bjorn Pippin 11/2020 for lightheadedness. At that visit her blood pressure was uncontrolled on clonidine, losartan, and amlodipine. She was taking clonidine once a day so it was discontinued, and amlodipine was increased. HCTZ was also initiated at that visit. She had carotid dopplers that showed less than 50% stenosis of the internal carotid arteries. Echo revealed LVEF 60-65% with mild LVH. She had normal ABIs 11/2021.  Today, she states she is feeling well. She reports her blood pressure issues began 2009 when she was first started on antihypertensives. More recently has become difficult to control in the setting of her other health issues, including polyarteritis nervosa for which she sees rheumatology. Since January she complains of numbness and discomfort in her bilateral feet (initial onset in her left foot, then progressed to her right foot  one month later). Overall she believes her symptoms are better than they were months ago. In the office today her blood pressure is initially 134/78 (132/76 on manual recheck of left arm; 136/84 on manual recheck of right arm). She reports better readings at home and states that her readings are always elevated in clinic visits. She is typically active without much formal exercise. When she is active she denies anginal symptoms or shortness of breath. Recently she noticed mild swelling in her left leg, but this only lasted a brief time. Otherwise she denies significant issues with LE edema. Typically she is cooking meals at home and monitors her salt intake. She doesn't use salt in her cooking. No coffee, may have soda once a month. No alcohol consumption, no smoking history. For pain management, she may need to take a Tylenol once a month. In the past she has had issues with myalgias due to statin therapy. Currently on 5 mg rosuvastatin which continues to cause side effects of muscle aches. She denies snoring. Generally feels well rested in the mornings. She denies any palpitations, lightheadedness, headaches, syncope, orthopnea, or PND.  Previous antihypertensives: Clonidine Spironolactone  Past Medical History:  Diagnosis Date   Cerebral vascular disease    Carotid ultrasound in 10/2011: Mild plaque; tortuous vessels; no definite luminal obstruction.   Elevated LFTs    with positive ASMA and mildly elevated IgG, no biopsy pursued as LFTs normalized.   Hepatic steatosis    By CT scan   Hyperlipidemia    Hypertension    Hypothyroidism due to Hashimoto's thyroiditis 2021   Neuropathy    Overweight(278.02)    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HCC) 2021   Uterine leiomyoma  By CT scan    Past Surgical History:  Procedure Laterality Date   COLONOSCOPY  12/2007   Negative screening study   COLONOSCOPY N/A 08/17/2018   Surgeon: Corbin Ade, MD; normal exam.  Repeat in 2030.   DIAGNOSTIC LAPAROSCOPY   1978   Gynecologic problems    Current Medications: Current Meds  Medication Sig   alendronate (FOSAMAX) 70 MG tablet TAKE 1 TABLET BY MOUTH ONCE A WEEK ON AN EMPTY STOMACH WITH A FULL GLASS OF WATER   amLODipine (NORVASC) 10 MG tablet Take 1 tablet by mouth once daily   aspirin EC 81 MG tablet Take 81 mg by mouth daily. Swallow whole.   carvedilol (COREG) 3.125 MG tablet Take 1 tablet (3.125 mg total) by mouth 2 (two) times daily with a meal.   Evolocumab (REPATHA SURECLICK) 140 MG/ML SOAJ Inject 140 mg into the skin every 14 (fourteen) days.   Evolocumab (REPATHA SURECLICK) 140 MG/ML SOAJ Inject 140 mg into the skin every 14 (fourteen) days for 21 days.   levothyroxine (SYNTHROID) 112 MCG tablet Take 1 tablet (112 mcg total) by mouth daily before breakfast.   losartan-hydrochlorothiazide (HYZAAR) 100-25 MG tablet Take 1 tablet by mouth once daily   pregabalin (LYRICA) 50 MG capsule Take 1 capsule (50 mg total) by mouth 2 (two) times daily.   rosuvastatin (CRESTOR) 5 MG tablet Take 1 tablet (5 mg total) by mouth daily.     Allergies:   Atorvastatin   Social History   Socioeconomic History   Marital status: Single    Spouse name: Not on file   Number of children: 0   Years of education: Not on file   Highest education level: Not on file  Occupational History   Occupation: Middle school teacher retired    Comment: X25 years  Tobacco Use   Smoking status: Never   Smokeless tobacco: Never  Vaping Use   Vaping status: Never Used  Substance and Sexual Activity   Alcohol use: Not Currently    Comment: occasionally. About twice a year.    Drug use: No   Sexual activity: Not Currently  Other Topics Concern   Not on file  Social History Narrative   Lives with sister occasionally   Right Handed   Social Determinants of Health   Financial Resource Strain: Low Risk  (09/16/2022)   Overall Financial Resource Strain (CARDIA)    Difficulty of Paying Living Expenses: Not very hard   Food Insecurity: No Food Insecurity (09/16/2022)   Hunger Vital Sign    Worried About Running Out of Food in the Last Year: Never true    Ran Out of Food in the Last Year: Never true  Transportation Needs: No Transportation Needs (09/16/2022)   PRAPARE - Administrator, Civil Service (Medical): No    Lack of Transportation (Non-Medical): No  Physical Activity: Insufficiently Active (09/16/2022)   Exercise Vital Sign    Days of Exercise per Week: 3 days    Minutes of Exercise per Session: 20 min  Stress: Not on file  Social Connections: Not on file     Family History: The patient's family history includes Cancer in her maternal grandmother and paternal grandmother; Cancer (age of onset: 20) in her father; Hypertension in her mother, sister, sister, sister, and sister; Pancreatic cancer (age of onset: 16) in her mother; Stroke (age of onset: 4) in her sister. There is no history of Colon cancer or Breast cancer.  ROS:   Please  see the history of present illness.    (+) Numbness of bilateral feet (+) Myalgias on rosuvastatin All other systems reviewed and are negative.  EKGs/Labs/Other Studies Reviewed:    ABIs 12/19/2021: Summary:  Right: Resting right ankle-brachial index is within normal range. The  right toe-brachial index is normal.   Left: Resting left ankle-brachial index is within normal range. The left  toe-brachial index is normal.   Echo  01/15/2021:  1. Left ventricular ejection fraction, by estimation, is 60 to 65%. The  left ventricle has normal function. The left ventricle has no regional  wall motion abnormalities. There is mild left ventricular hypertrophy.  Left ventricular diastolic parameters  are indeterminate.   2. Right ventricular systolic function is normal. The right ventricular  size is normal. Tricuspid regurgitation signal is inadequate for assessing  PA pressure.   3. The mitral valve is normal in structure. No evidence of mitral valve   regurgitation. No evidence of mitral stenosis.   4. The aortic valve is tricuspid. There is mild calcification of the  aortic valve. There is mild thickening of the aortic valve. Aortic valve  regurgitation is trivial. No aortic stenosis is present.   5. The inferior vena cava is normal in size with greater than 50%  respiratory variability, suggesting right atrial pressure of 3 mmHg.   Carotid Dopplers  09/23/2020: RIGHT CAROTID ARTERY: No significant atheromatous plaque.   RIGHT VERTEBRAL ARTERY:  Antegrade flow.   LEFT CAROTID ARTERY: Moderate heterogeneous plaque at the carotid bifurcation with Doppler measurements indicative of less than 50% stenosis.   IMPRESSION: Less than 50% stenosis of the internal carotid arteries.   EKG:  EKG is personally reviewed. 09/16/2022: Sinus bradycardia.  Sinus arrhythmia.   Rate 57 bpm.  Recent Labs: 06/02/2022: ALT 13; BUN 18; Creatinine, Ser 0.92; Hemoglobin 12.2; Platelets 247; Potassium 4.0; Sodium 143 07/31/2022: TSH 2.770   Recent Lipid Panel    Component Value Date/Time   CHOL 151 06/02/2022 1557   TRIG 69 06/02/2022 1557   HDL 47 06/02/2022 1557   CHOLHDL 3.2 06/02/2022 1557   CHOLHDL 3.5 12/04/2020 1427   VLDL 13 12/04/2020 1427   LDLCALC 90 06/02/2022 1557   LDLCALC 143 (H) 09/14/2019 1340    Physical Exam:    VS:  BP 136/84 (BP Location: Right Arm, Patient Position: Sitting, Cuff Size: Normal)   Pulse (!) 57   Ht 5\' 4"  (1.626 m)   Wt 150 lb (68 kg)   BMI 25.75 kg/m  , BMI Body mass index is 25.75 kg/m. GENERAL:  Well appearing HEENT: Pupils equal round and reactive, fundi not visualized, oral mucosa unremarkable NECK:  No jugular venous distention, waveform within normal limits, carotid upstroke brisk and symmetric, no bruits, no thyromegaly LUNGS:  Clear to auscultation bilaterally HEART:  RRR.  PMI not displaced or sustained, S1 and S2 within normal limits, no S3, no S4, no clicks, no rubs, no murmurs ABD:  Flat,  positive bowel sounds normal in frequency in pitch, no bruits, no rebound, no guarding, no midline pulsatile mass, no hepatomegaly, no splenomegaly EXT:  2 plus pulses throughout, no edema, no cyanosis, no clubbing SKIN:  No rashes, no nodules NEURO:  Cranial nerves II through XII grossly intact, motor grossly intact throughout PSYCH:  Cognitively intact, oriented to person place and time   ASSESSMENT/PLAN:       # Resistant Hypertension: Uncontrolled on Amlodipine, Carvedilol, and Losartan/Hydrochlorothiazide. Discussed potential secondary causes of hypertension and the importance of lifestyle  modifications. BP improved on repeat in the office and she thinks it is lower at home.  -Continue current antihypertensive medications. -Order renal artery Dopplers to rule out renal artery stenosis. -Enroll in remote patient monitoring study for better understanding of home blood pressure readings.  She consents to monitoring in the [employer] RPM system.  -Enroll in PREP exercise program at [employer].   # Carotid stenosis: # Hyperlipidemia: LDL 90 on low-dose Rosuvastatin with muscle aches. She has not tolerated other statins in the past.  Discussed the need for LDL to be under 70 due to presence of carotid artery plaque and options for non-statin lipid-lowering therapies. -Discontinue Rosuvastatin due to intolerance. -Start Repatha injections every two weeks. -Check lipid panel in 2-3 months.  # Polyarteritis Nodosa: Chronic condition causing foot pain and limiting physical activity. Managed by Rheumatology. -Continue current management with Rheumatology.  General Health Maintenance: -Encourage continued low salt and low caffeine diet. -Encourage continued avoidance of NSAIDs due to potential effects on blood pressure and kidney function.        Screening for Secondary Hypertension:     09/16/2022    8:40 AM  Causes  Drugs/Herbals Screened     - Comments Limits salt.  Rare caffeine.  No  EtOH.  No NSAIDS  Sleep Apnea Screened     - Comments No symptoms  Thyroid Disease Screened  Pheochromocytoma Screened     - Comments No symptoms  Cushing's Syndrome N/A  Hyperparathyroidism Screened    Relevant Labs/Studies:    Latest Ref Rng & Units 06/02/2022    3:57 PM 10/21/2021    8:09 AM 04/22/2021    9:40 AM  Basic Labs  Sodium 134 - 144 mmol/L 143  134  141   Potassium 3.5 - 5.2 mmol/L 4.0  4.0  3.9   Creatinine 0.57 - 1.00 mg/dL 1.61  0.96  0.45        Latest Ref Rng & Units 07/31/2022    9:58 AM 04/07/2022    4:11 PM  Thyroid   TSH 0.450 - 4.500 uIU/mL 2.770  4.630                 09/16/2022    8:59 AM  Renovascular   Renal Artery [place] Completed Yes    Disposition:    FU with APP/PharmD in 1 month for the next 3 months.   FU with Joretta Eads C. Duke Salvia, MD, [facility] in 4 months.  Medication Adjustments/Labs and Tests Ordered: Current medicines are reviewed at length with the patient today.  Concerns regarding medicines are outlined above.   Orders Placed This Encounter  Procedures   Aldosterone + renin activity w/ ratio   Amb Referral To Provider Referral Exercise Program (P.R.E.P)   EKG 12-Lead   VAS US RENAL ARTERY DUPLEX   Meds ordered this encounter  Medications   Evolocumab (REPATHA SURECLICK) 140 MG/ML SOAJ    Sig: Inject 140 mg into the skin every 14 (fourteen) days.    Dispense:  6 mL    Refill:  3    D/C ROSUVASTATIN   Evolocumab (REPATHA SURECLICK) 140 MG/ML SOAJ    Sig: Inject 140 mg into the skin every 14 (fourteen) days for 21 days.    Order Specific Question:   Lot Number?    Answer:   [id number]    Order Specific Question:   Expiration Date?    Answer:   06/22/1924   I,Mathew Stumpf,acting as a scribe for Chilton Si, MD.,have documented all  relevant documentation on the behalf of Chilton Si, MD,as directed by  Chilton Si, MD while in the presence of Chilton Si, MD.  I, Nyair Depaulo C. Duke Salvia, MD have reviewed all  documentation for this visit.  The documentation of the exam, diagnosis, procedures, and orders on 09/16/2022 are all accurate and complete.   Signed, Chilton Si, MD  09/16/2022 9:03 AM    [facility]

## 2022-09-16 NOTE — Patient Instructions (Addendum)
Medication Instructions:  STOP ROSUVASTATIN   START REPATHA 140 MG EVERY 2 WEEKS    Labwork: RENIN/ALDOSTERONE TODAY    Testing/Procedures: Your physician has requested that you have a renal artery duplex. During this test, an ultrasound is used to evaluate blood flow to the kidneys. Allow one hour for this exam. Do not eat after midnight the day before and avoid carbonated beverages. Take your medications as you usually do.   Follow-Up: 11/19/2022 8:00 AM WITH CAITLIN W NP   01/27/2023 8:45 AM WITH DR [place]    You will receive a phone call from the PREP exercise and nutrition program to schedule an initial assessment.    Special Instructions:  MONITOR YOUR BLOOD PRESSURE TWICE A DAY WITH MACHINE PROVIDED. MAKE SURE YOU ARE LOGGED INTO YOUR VIVIFY APP WHEN CHECKING   DASH Eating Plan DASH stands for "Dietary Approaches to Stop Hypertension." The DASH eating plan is a healthy eating plan that has been shown to reduce high blood pressure (hypertension). It may also reduce your risk for type 2 diabetes, heart disease, and stroke. The DASH eating plan may also help with weight loss. What are tips for following this plan?  General guidelines Avoid eating more than 2,300 mg (milligrams) of salt (sodium) a day. If you have hypertension, you may need to reduce your sodium intake to 1,500 mg a day. Limit alcohol intake to no more than 1 drink a day for nonpregnant women and 2 drinks a day for men. One drink equals 12 oz of beer, 5 oz of wine, or 1 oz of hard liquor. Work with your health care provider to maintain a healthy body weight or to lose weight. Ask what an ideal weight is for you. Get at least 30 minutes of exercise that causes your heart to beat faster (aerobic exercise) most days of the week. Activities may include walking, swimming, or biking. Work with your health care provider or diet and nutrition specialist (dietitian) to adjust your eating plan to your individual calorie  needs. Reading food labels  Check food labels for the amount of sodium per serving. Choose foods with less than 5 percent of the Daily Value of sodium. Generally, foods with less than 300 mg of sodium per serving fit into this eating plan. To find whole grains, look for the word "whole" as the first word in the ingredient list. Shopping Buy products labeled as "low-sodium" or "no salt added." Buy fresh foods. Avoid canned foods and premade or frozen meals. Cooking Avoid adding salt when cooking. Use salt-free seasonings or herbs instead of table salt or sea salt. Check with your health care provider or pharmacist before using salt substitutes. Do not fry foods. Cook foods using healthy methods such as baking, boiling, grilling, and broiling instead. Cook with heart-healthy oils, such as olive, canola, soybean, or sunflower oil. Meal planning Eat a balanced diet that includes: 5 or more servings of fruits and vegetables each day. At each meal, try to fill half of your plate with fruits and vegetables. Up to 6-8 servings of whole grains each day. Less than 6 oz of lean meat, poultry, or fish each day. A 3-oz serving of meat is about the same size as a deck of cards. One egg equals 1 oz. 2 servings of low-fat dairy each day. A serving of nuts, seeds, or beans 5 times each week. Heart-healthy fats. Healthy fats called Omega-3 fatty acids are found in foods such as flaxseeds and coldwater fish, like sardines, salmon,  and mackerel. Limit how much you eat of the following: Canned or prepackaged foods. Food that is high in trans fat, such as fried foods. Food that is high in saturated fat, such as fatty meat. Sweets, desserts, sugary drinks, and other foods with added sugar. Full-fat dairy products. Do not salt foods before eating. Try to eat at least 2 vegetarian meals each week. Eat more home-cooked food and less restaurant, buffet, and fast food. When eating at a restaurant, ask that your  food be prepared with less salt or no salt, if possible. What foods are recommended? The items listed may not be a complete list. Talk with your dietitian about what dietary choices are best for you. Grains Whole-grain or whole-wheat bread. Whole-grain or whole-wheat pasta. Brown rice. Orpah Moffet. Bulgur. Whole-grain and low-sodium cereals. Pita bread. Low-fat, low-sodium crackers. Whole-wheat flour tortillas. Vegetables Fresh or frozen vegetables (raw, steamed, roasted, or grilled). Low-sodium or reduced-sodium tomato and vegetable juice. Low-sodium or reduced-sodium tomato sauce and tomato paste. Low-sodium or reduced-sodium canned vegetables. Fruits All fresh, dried, or frozen fruit. Canned fruit in natural juice (without added sugar). Meat and other protein foods Skinless chicken or [place]. Ground chicken or [place]. Pork with fat trimmed off. Fish and seafood. Egg whites. Dried beans, peas, or lentils. Unsalted nuts, nut butters, and seeds. Unsalted canned beans. Lean cuts of beef with fat trimmed off. Low-sodium, lean deli meat. Dairy Low-fat (1%) or fat-free (skim) milk. Fat-free, low-fat, or reduced-fat cheeses. Nonfat, low-sodium ricotta or cottage cheese. Low-fat or nonfat yogurt. Low-fat, low-sodium cheese. Fats and oils Soft margarine without trans fats. Vegetable oil. Low-fat, reduced-fat, or light mayonnaise and salad dressings (reduced-sodium). Canola, safflower, olive, soybean, and sunflower oils. Avocado. Seasoning and other foods Herbs. Spices. Seasoning mixes without salt. Unsalted popcorn and pretzels. Fat-free sweets. What foods are not recommended? The items listed may not be a complete list. Talk with your dietitian about what dietary choices are best for you. Grains Baked goods made with fat, such as croissants, muffins, or some breads. Dry pasta or rice meal packs. Vegetables Creamed or fried vegetables. Vegetables in a cheese sauce. Regular canned vegetables (not  low-sodium or reduced-sodium). Regular canned tomato sauce and paste (not low-sodium or reduced-sodium). Regular tomato and vegetable juice (not low-sodium or reduced-sodium). Rosita Fire. Olives. Fruits Canned fruit in a light or heavy syrup. Fried fruit. Fruit in cream or butter sauce. Meat and other protein foods Fatty cuts of meat. Ribs. Fried meat. Tomasa Blase. Sausage. Bologna and other processed lunch meats. Salami. Fatback. Hotdogs. Bratwurst. Salted nuts and seeds. Canned beans with added salt. Canned or smoked fish. Whole eggs or egg yolks. Chicken or [place] with skin. Dairy Whole or 2% milk, cream, and half-and-half. Whole or full-fat cream cheese. Whole-fat or sweetened yogurt. Full-fat cheese. Nondairy creamers. Whipped toppings. Processed cheese and cheese spreads. Fats and oils Butter. Stick margarine. Lard. Shortening. Ghee. Bacon fat. Tropical oils, such as coconut, palm kernel, or palm oil. Seasoning and other foods Salted popcorn and pretzels. Onion salt, garlic salt, seasoned salt, table salt, and sea salt. Worcestershire sauce. Tartar sauce. Barbecue sauce. Teriyaki sauce. Soy sauce, including reduced-sodium. Steak sauce. Canned and packaged gravies. Fish sauce. Oyster sauce. Cocktail sauce. Horseradish that you find on the shelf. Ketchup. Mustard. Meat flavorings and tenderizers. Bouillon cubes. Hot sauce and Tabasco sauce. Premade or packaged marinades. Premade or packaged taco seasonings. Relishes. Regular salad dressings. Where to find more information: National Heart, Lung, and Blood Institute: PopSteam.is American Heart Association: www.heart.org Summary The  DASH eating plan is a healthy eating plan that has been shown to reduce high blood pressure (hypertension). It may also reduce your risk for type 2 diabetes, heart disease, and stroke. With the DASH eating plan, you should limit salt (sodium) intake to 2,300 mg a day. If you have hypertension, you may need to reduce your  sodium intake to 1,500 mg a day. When on the DASH eating plan, aim to eat more fresh fruits and vegetables, whole grains, lean proteins, low-fat dairy, and heart-healthy fats. Work with your health care provider or diet and nutrition specialist (dietitian) to adjust your eating plan to your individual calorie needs. This information is not intended to replace advice given to you by your health care provider. Make sure you discuss any questions you have with your health care provider. Document Released: 01/29/2011 Document Revised: 01/22/2017 Document Reviewed: 02/03/2016 Elsevier Patient Education  2020 [employer].

## 2022-09-17 ENCOUNTER — Telehealth: Payer: Self-pay | Admitting: Family Medicine

## 2022-09-17 ENCOUNTER — Telehealth: Payer: Self-pay

## 2022-09-17 ENCOUNTER — Other Ambulatory Visit (HOSPITAL_COMMUNITY): Payer: Self-pay

## 2022-09-17 DIAGNOSIS — Z0000 Encounter for general adult medical examination without abnormal findings: Secondary | ICD-10-CM

## 2022-09-17 NOTE — Telephone Encounter (Signed)
Conducted welcome call with patient. Reviewed prompt times with patient. Patient stated that the prompt times works for her schedule. No changes were made to patient's prompt times. Patient stated that she was not able to access the app this morning to check her bp due to multiple invalid pin numbers being sent.  Submitted a ticket to tech support for troubleshooting. Offered patient health coaching per survey response indicating that she does not have an eating plan. Reviewed DASH diet recommendation from Dr. Duke Salvia per her AVS from 09/15/21. Patient stated that she mainly follows such dietary recommendations and do not consume a lot of sodium. Patient declined health coaching.    Renaee Munda, MS, ERHD, [facility]  Care Guide, Health & Wellness Coach [address], [address] [place] [address] Telephone: [phone] Email: [email or web address]

## 2022-09-17 NOTE — Telephone Encounter (Signed)
Faith Hood called from [facility] received a referral and must have been sent to the wrong office. Dr Coralyn Helling is not at this practice but the have other providers there. Faith Hood ask for a call back [phone] ext. [phone] to let her know.

## 2022-09-18 ENCOUNTER — Telehealth: Payer: Self-pay | Admitting: Family Medicine

## 2022-09-18 ENCOUNTER — Telehealth: Payer: Self-pay | Admitting: Cardiovascular Disease

## 2022-09-18 NOTE — Telephone Encounter (Signed)
I have [employer] on the line wanting to get a verbal on the medication Repatha for Dr. Duke Salvia

## 2022-09-18 NOTE — Telephone Encounter (Signed)
Referral  for rheumatology was sent to the wrong location it need to be sent to [facility] on [address], [place], [place] [phone]

## 2022-09-18 NOTE — Telephone Encounter (Signed)
Transferred from call center,   Lesly Rubenstein from [place], questions about prescription answered, fax number provided.

## 2022-09-21 NOTE — Addendum Note (Signed)
Addended by: Ashleynicole Mcclees E on: 09/21/2022 09:52 AM   Modules accepted: Orders

## 2022-09-21 NOTE — Telephone Encounter (Signed)
Received fax for PA approval through 02/23/23, scanned into media tab.

## 2022-09-23 NOTE — Progress Notes (Unsigned)
Referring Provider: Kerri Perches, MD Primary Care Physician:  Kerri Perches, MD Primary GI Physician: Dr. Jena Gauss  No chief complaint on file.   HPI:   Faith Hood is a 67 y.o. female presenting today for follow-up of elevated LFTs.  She has history of mild LFT elevation dating back to 2018.  Extensive evaluation including Hep A, B, and C negative without immunity to Hep A or B, ANA negative, AMA negative, ASMA elevated at 24, IgG slightly elevated at 1591, ceruloplasmin within normal limits, no evidence of alpha 1 antitrypsin deficiency.  Iron panel with ferritin 430H. 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 Celiac screen negative. MRI liver protocol January 2021 with normal appearing liver. 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 LFTs normalized in November 2021, but increased mildly again in March 2022  in the setting of hypothyroidism with TSH 42.5, thought to be secondary to Hashimoto's thyroiditis. TSH improved after starting levothyroxine and LFTs normalized in July 2022, and have remained normal thus far, most recently in April 2023.***  It has been suspected that  LFT elevation was likely multifactorial in the setting of polyarteritis nodosa (though liver involvement is less common), hypothyroidism/Hashimoto's thyroiditis, and can't rule out underlying component of autoimmune hepatitis with mildly elevated ASMA and IgG, but as LFTs returned to normal, recommended ongoing monitoring with low threshold for liver biopsy if LFTs were to increase again.  LFTs have remained within normal limits with most recent labs 06/02/2022.  Today:   Past Medical History:  Diagnosis Date   Cerebral vascular disease    Carotid ultrasound in 10/2011: Mild plaque;  tortuous vessels; no definite luminal obstruction.   Elevated LFTs    with positive ASMA and mildly elevated IgG, no biopsy pursued as LFTs normalized.   Hepatic steatosis    By CT scan   Hyperlipidemia    Hypertension    Hypothyroidism due to Hashimoto's thyroiditis 2021   Neuropathy    Overweight(278.02)    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HCC) 2021   Uterine leiomyoma    By CT scan    Past Surgical History:  Procedure Laterality Date   COLONOSCOPY  12/2007   Negative screening study   COLONOSCOPY N/A 08/17/2018   Surgeon: Corbin Ade, MD; normal exam.  Repeat in 2030.   DIAGNOSTIC LAPAROSCOPY  1978   Gynecologic problems    Current Outpatient Medications  Medication Sig Dispense Refill   alendronate (FOSAMAX) 70 MG tablet TAKE 1 TABLET BY MOUTH ONCE A WEEK ON AN EMPTY STOMACH WITH A FULL GLASS OF WATER 4 tablet 0   amLODipine (NORVASC) 10 MG tablet Take 1 tablet by mouth once daily 90 tablet 0   aspirin EC 81 MG tablet Take 81 mg by mouth daily. Swallow whole.     carvedilol (COREG) 3.125 MG tablet Take 1 tablet (3.125 mg total) by mouth 2 (two) times daily with a meal. 60 tablet 3   Evolocumab (REPATHA SURECLICK) 140 MG/ML SOAJ Inject 140 mg into the skin every 14 (fourteen) days. 6 mL 3   levothyroxine (SYNTHROID) 112 MCG tablet Take 1 tablet (112 mcg total) by mouth daily before breakfast. 90 tablet 1   losartan-hydrochlorothiazide (HYZAAR) 100-25 MG tablet Take 1 tablet by mouth once daily 90 tablet 0   pregabalin (LYRICA) 50 MG capsule  Take 1 capsule (50 mg total) by mouth 2 (two) times daily. 60 capsule 1   No current facility-administered medications for this visit.    Allergies as of 09/24/2022 - Review Complete 09/16/2022  Allergen Reaction Noted   Atorvastatin Other (See Comments) 04/01/2022    Family History  Problem Relation Age of Onset   Pancreatic cancer Mother 19       Diagnosed in 2009; currently in hospice pt. htn   Hypertension Mother        + Sister x4    Cancer Father 40       brain tumor   Hypertension Sister    Hypertension Sister    Hypertension Sister    Stroke Sister 32   Hypertension Sister    Cancer Maternal Grandmother    Cancer Paternal Grandmother    Colon cancer Neg Hx    Breast cancer Neg Hx     Social History   Socioeconomic History   Marital status: Single    Spouse name: Not on file   Number of children: 0   Years of education: Not on file   Highest education level: Not on file  Occupational History   Occupation: Middle school teacher retired    Comment: X25 years  Tobacco Use   Smoking status: Never   Smokeless tobacco: Never  Vaping Use   Vaping status: Never Used  Substance and Sexual Activity   Alcohol use: Not Currently    Comment: occasionally. About twice a year.    Drug use: No   Sexual activity: Not Currently  Other Topics Concern   Not on file  Social History Narrative   Lives with sister occasionally   Right Handed   Social Determinants of Health   Financial Resource Strain: Low Risk  (09/16/2022)   Overall Financial Resource Strain (CARDIA)    Difficulty of Paying Living Expenses: Not very hard  Food Insecurity: No Food Insecurity (09/16/2022)   Hunger Vital Sign    Worried About Running Out of Food in the Last Year: Never true    Ran Out of Food in the Last Year: Never true  Transportation Needs: No Transportation Needs (09/16/2022)   PRAPARE - Administrator, Civil Service (Medical): No    Lack of Transportation (Non-Medical): No  Physical Activity: Insufficiently Active (09/16/2022)   Exercise Vital Sign    Days of Exercise per Week: 3 days    Minutes of Exercise per Session: 20 min  Stress: Not on file  Social Connections: Not on file    Review of Systems: Gen: Denies fever, chills, anorexia. Denies fatigue, weakness, weight loss.  CV: Denies chest pain, palpitations, syncope, peripheral edema, and claudication. Resp: Denies dyspnea at rest, cough, wheezing,  coughing up blood, and pleurisy. GI: Denies vomiting blood, jaundice, and fecal incontinence.   Denies dysphagia or odynophagia. Derm: Denies rash, itching, dry skin Psych: Denies depression, anxiety, memory loss, confusion. No homicidal or suicidal ideation.  Heme: Denies bruising, bleeding, and enlarged lymph nodes.  Physical Exam: There were no vitals taken for this visit. General:   Alert and oriented. No distress noted. Pleasant and cooperative.  Head:  Normocephalic and atraumatic. Eyes:  Conjuctiva clear without scleral icterus. Heart:  S1, S2 present without murmurs appreciated. Lungs:  Clear to auscultation bilaterally. No wheezes, rales, or rhonchi. No distress.  Abdomen:  +BS, soft, non-tender and non-distended. No rebound or guarding. No HSM or masses noted. Msk:  Symmetrical without gross deformities. Normal posture. Extremities:  Without edema. Neurologic:  Alert and  oriented x4 Psych:  Normal mood and affect.    Assessment:     Plan:  ***   Ermalinda Memos, PA-C [facility] 09/24/2022

## 2022-09-24 ENCOUNTER — Telehealth: Payer: Self-pay

## 2022-09-24 ENCOUNTER — Encounter: Payer: Self-pay | Admitting: Gastroenterology

## 2022-09-24 ENCOUNTER — Ambulatory Visit: Payer: Medicare PPO | Admitting: Gastroenterology

## 2022-09-24 VITALS — BP 132/76 | HR 60 | Temp 97.9°F | Ht 63.0 in | Wt 150.8 lb

## 2022-09-24 DIAGNOSIS — Z0000 Encounter for general adult medical examination without abnormal findings: Secondary | ICD-10-CM

## 2022-09-24 DIAGNOSIS — Z006 Encounter for examination for normal comparison and control in clinical research program: Secondary | ICD-10-CM

## 2022-09-24 DIAGNOSIS — R7989 Other specified abnormal findings of blood chemistry: Secondary | ICD-10-CM

## 2022-09-24 NOTE — Telephone Encounter (Signed)
Called patient to follow up on [employer] issue. Tech support communicated they have not been able to reach the patient. Patient did not answer. Voicemail is not setup to leave messages.    Renaee Munda, MS, ERHD, [facility]  Care Guide, Health & Wellness Coach [address], [address] [place] [address] Telephone: [phone] Email: [email or web address]

## 2022-09-24 NOTE — Telephone Encounter (Signed)
Attempted to return pt's phone call regarding her [employer] connectivity issues. Pt did not answer. I was unable to leave a message due to pt's voicemail not set up.

## 2022-09-30 ENCOUNTER — Other Ambulatory Visit: Payer: Self-pay | Admitting: Family Medicine

## 2022-10-05 ENCOUNTER — Telehealth: Payer: Self-pay | Admitting: Cardiovascular Disease

## 2022-10-05 NOTE — Telephone Encounter (Signed)
Returned call to patient, advised her to keep appointment. Patient states she cannot get into vivify system which is why she has not been entering into system. Advised patient to bring cuff to appointment and we would take a look at it.

## 2022-10-05 NOTE — Telephone Encounter (Signed)
Patient is calling she has an appt this Friday. Patient hasn't been writing down her BP readings and would like to know if she needs to r/s. Please advise.

## 2022-10-06 ENCOUNTER — Telehealth: Payer: Self-pay

## 2022-10-06 DIAGNOSIS — Z006 Encounter for examination for normal comparison and control in clinical research program: Secondary | ICD-10-CM

## 2022-10-06 NOTE — Telephone Encounter (Signed)
The following message was forwarded to me regarding pt's Viv[employer] connectivity issues from [facility].  Returned call to patient, advised her to keep appointment. Patient states she cannot get into vivify system which is why she has not been entering into system. Advised patient to bring cuff to appointment and we would take a look at it.   I attempted to return pt's phone call regarding her [employer] connectivity issues on 09/24/2022. Pt did not answer. I was unable to leave a message due to pt's voicemail not set up. I attempted to reach out to pt again this morning. Pt did not answer. I was unable to leave a message due to pt's voicemail is full. I will follow up with a myChart message.

## 2022-10-09 ENCOUNTER — Encounter (HOSPITAL_BASED_OUTPATIENT_CLINIC_OR_DEPARTMENT_OTHER): Payer: Medicare PPO

## 2022-10-15 ENCOUNTER — Ambulatory Visit (INDEPENDENT_AMBULATORY_CARE_PROVIDER_SITE_OTHER): Payer: Medicare PPO

## 2022-10-15 DIAGNOSIS — I1A0 Resistant hypertension: Secondary | ICD-10-CM | POA: Diagnosis not present

## 2022-10-15 DIAGNOSIS — I10 Essential (primary) hypertension: Secondary | ICD-10-CM | POA: Diagnosis not present

## 2022-10-21 ENCOUNTER — Telehealth: Payer: Self-pay

## 2022-10-21 ENCOUNTER — Ambulatory Visit (INDEPENDENT_AMBULATORY_CARE_PROVIDER_SITE_OTHER): Payer: Medicare PPO | Admitting: Family Medicine

## 2022-10-21 ENCOUNTER — Encounter: Payer: Self-pay | Admitting: Family Medicine

## 2022-10-21 VITALS — BP 146/84 | HR 62 | Ht 64.0 in | Wt 151.1 lb

## 2022-10-21 DIAGNOSIS — Z23 Encounter for immunization: Secondary | ICD-10-CM

## 2022-10-21 DIAGNOSIS — I1A0 Resistant hypertension: Secondary | ICD-10-CM | POA: Diagnosis not present

## 2022-10-21 DIAGNOSIS — Z0000 Encounter for general adult medical examination without abnormal findings: Secondary | ICD-10-CM

## 2022-10-21 DIAGNOSIS — Z0100 Encounter for examination of eyes and vision without abnormal findings: Secondary | ICD-10-CM

## 2022-10-21 DIAGNOSIS — Z780 Asymptomatic menopausal state: Secondary | ICD-10-CM | POA: Diagnosis not present

## 2022-10-21 DIAGNOSIS — Z0001 Encounter for general adult medical examination with abnormal findings: Secondary | ICD-10-CM | POA: Diagnosis not present

## 2022-10-21 DIAGNOSIS — Z1231 Encounter for screening mammogram for malignant neoplasm of breast: Secondary | ICD-10-CM

## 2022-10-21 NOTE — Patient Instructions (Addendum)
Welcome  to medicare with MD in mid January, call if you needme sooner  Pneumonia 20 vaccine today  You are being referred for eye exam  Please schedule mammogram due end January at checkout  Please schedule bone density  at checkout  I recommend both the covid and flu vaccines, you  can get the flu vaccine here, callback in 2 weeks for it/ arrange with nurse   Pls go directly to enrol in your prep class, good for you!  It is important that you exercise regularly at least 30 minutes 5 times a week. If you develop chest pain, have severe difficulty breathing, or feel very tired, stop exercising immediately and seek medical attention    Thanks for choosing [facility], we consider it a privelige to serve you.

## 2022-10-21 NOTE — Telephone Encounter (Signed)
Called the patient regarding missing bp readings. Patient stated that she has not been able to access the app because of the pin number not being accepted. Patient is writing down her readings. Patient expressed she would like a call to troubleshoot. Submitted a ticket on patient's behalf. Informed patient that Sydnee Levans will reach out 5 times before closing the ticket. Ticket #[id number]. Will follow up with patient after getting an update from [employer].   Renaee Munda, MS, ERHD, [facility]  Care Guide, Health & Wellness Coach [address] [place] [address] Telephone: [phone] Email: [email or web address]

## 2022-10-26 ENCOUNTER — Encounter: Payer: Self-pay | Admitting: Family Medicine

## 2022-10-26 NOTE — Progress Notes (Signed)
    Faith Hood     MRN: [id number]      DOB: May 27, 1955  Chief Complaint  Patient presents with   Annual Exam    CPE    HPI: Patient is in for annual physical exam. No other health concerns are expressed or addressed at the visit. Recent labs,  are reviewed. Immunization is reviewed , and  updated if needed.   PE: BP (!) 146/84   Pulse 62   Ht 5\' 4"  (1.626 m)   Wt 151 lb 1.9 oz (68.5 kg)   SpO2 96%   BMI 25.94 kg/m   Pleasant  female, alert and oriented x 3, in no cardio-pulmonary distress. Afebrile. HEENT No facial trauma or asymetry. Sinuses non tender.  Extra occullar muscles intact.. External ears normal, . Neck: supple, no adenopathy,JVD or thyromegaly.No bruits.  Chest: Clear to ascultation bilaterally.No crackles or wheezes. Non tender to palpation  Breast: Not examined, no concerns, mammogram is up to date and will be scheduled at checkout  Cardiovascular system; Heart sounds normal,  S1 and  S2 ,no S3.  No murmur, or thrill. Apical beat not displaced Peripheral pulses normal.  Abdomen: Soft, non tender, no organomegaly or masses. No bruits. Bowel sounds normal. No guarding, tenderness or rebound.   GU: Not examined, no concerns  Musculoskeletal exam: Full ROM of spine, hips , shoulders and knees. No deformity ,swelling or crepitus noted. No muscle wasting or atrophy.   Neurologic: Cranial nerves 2 to 12 intact. Power, tone ,sensation and reflexes normal throughout. No disturbance in gait. No tremor.  Skin: Intact, no ulceration, erythema , scaling or rash noted. Pigmentation normal throughout  Psych; Normal mood and affect. Judgement and concentration normal   Assessment & Plan:  Annual physical exam Annual exam as documented. Counseling done  re healthy lifestyle involving commitment to 150 minutes exercise per week, heart healthy diet, and attaining healthy weight.The importance of adequate sleep also discussed. Regular seat belt  use and home safety, is also discussed. Changes in health habits are decided on by the patient with goals and time frames  set for achieving them. Immunization and cancer screening needs are specifically addressed at this visit.

## 2022-10-26 NOTE — Assessment & Plan Note (Signed)

## 2022-11-04 ENCOUNTER — Other Ambulatory Visit: Payer: Self-pay

## 2022-11-04 ENCOUNTER — Telehealth: Payer: Self-pay

## 2022-11-04 DIAGNOSIS — Z0000 Encounter for general adult medical examination without abnormal findings: Secondary | ICD-10-CM

## 2022-11-04 MED ORDER — ALENDRONATE SODIUM 70 MG PO TABS: ORAL_TABLET | ORAL | 0 refills | Status: DC

## 2022-11-04 NOTE — Telephone Encounter (Signed)
Called patient per her survey response in [employer] indicating she has questions regarding her medications. Patient did not answer. Was unable to leave a voicemail due to mailbox not setup.   Renaee Munda, MS, ERHD, [facility]  Care Guide, Health & Wellness Coach [address] [place] [address] Telephone: [phone] Email: [email or web address]

## 2022-11-06 NOTE — Patient Instructions (Signed)

## 2022-11-10 ENCOUNTER — Ambulatory Visit: Payer: Medicare PPO | Admitting: Nurse Practitioner

## 2022-11-10 ENCOUNTER — Encounter: Payer: Self-pay | Admitting: Nurse Practitioner

## 2022-11-10 VITALS — BP 125/76 | HR 79 | Ht 64.0 in | Wt 154.4 lb

## 2022-11-10 DIAGNOSIS — E042 Nontoxic multinodular goiter: Secondary | ICD-10-CM

## 2022-11-10 DIAGNOSIS — E063 Autoimmune thyroiditis: Secondary | ICD-10-CM

## 2022-11-10 DIAGNOSIS — E038 Other specified hypothyroidism: Secondary | ICD-10-CM

## 2022-11-10 MED ORDER — LEVOTHYROXINE SODIUM 112 MCG PO TABS: 112.0000 ug | ORAL_TABLET | Freq: Every day | ORAL | 1 refills | Status: DC

## 2022-11-10 NOTE — Progress Notes (Signed)
Endocrinology Follow Up Note                                         11/10/2022, 10:42 AM  Subjective:   Subjective    Faith Hood is a 67 y.o.-year-old female patient being seen in follow up after being seen in consultation for hypothyroidism referred by Kerri Perches, MD.   Past Medical History:  Diagnosis Date   Cerebral vascular disease    Carotid ultrasound in 10/2011: Mild plaque; tortuous vessels; no definite luminal obstruction.   Elevated LFTs    with positive ASMA and mildly elevated IgG, no biopsy pursued as LFTs normalized.   Hepatic steatosis    By CT scan   Hyperlipidemia    Hypertension    Hypothyroidism due to Hashimoto's thyroiditis 2021   Neuropathy    Overweight(278.02)    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HCC) 2021   Uterine leiomyoma    By CT scan    Past Surgical History:  Procedure Laterality Date   COLONOSCOPY  12/2007   Negative screening study   COLONOSCOPY N/A 08/17/2018   Surgeon: Corbin Ade, MD; normal exam.  Repeat in 2030.   DIAGNOSTIC LAPAROSCOPY  1978   Gynecologic problems    Social History   Socioeconomic History   Marital status: Single    Spouse name: Not on file   Number of children: 0   Years of education: Not on file   Highest education level: Not on file  Occupational History   Occupation: Middle school teacher retired    Comment: X25 years  Tobacco Use   Smoking status: Never   Smokeless tobacco: Never  Vaping Use   Vaping status: Never Used  Substance and Sexual Activity   Alcohol use: Not Currently    Comment: occasionally. About twice a year.    Drug use: No   Sexual activity: Not Currently  Other Topics Concern   Not on file  Social History Narrative   Lives with sister occasionally   Right Handed   Social Determinants of Health   Financial Resource Strain: Low Risk  (09/16/2022)   Overall Financial Resource Strain (CARDIA)    Difficulty  of Paying Living Expenses: Not very hard  Food Insecurity: No Food Insecurity (09/16/2022)   Hunger Vital Sign    Worried About Running Out of Food in the Last Year: Never true    Ran Out of Food in the Last Year: Never true  Transportation Needs: No Transportation Needs (09/16/2022)   PRAPARE - Administrator, Civil Service (Medical): No    Lack of Transportation (Non-Medical): No  Physical Activity: Insufficiently Active (09/16/2022)   Exercise Vital Sign    Days of Exercise per Week: 3 days    Minutes of Exercise per Session: 20 min  Stress: Not on file  Social Connections: Not on file    Family History  Problem Relation Age of Onset   Pancreatic cancer Mother 41       Diagnosed  in 2009; currently in hospice pt. htn   Hypertension Mother        + Sister x4   Cancer Father 30       brain tumor   Hypertension Sister    Hypertension Sister    Hypertension Sister    Stroke Sister 32   Hypertension Sister    Cancer Maternal Grandmother    Cancer Paternal Grandmother    Colon cancer Neg Hx    Breast cancer Neg Hx     Outpatient Encounter Medications as of 11/10/2022  Medication Sig   alendronate (FOSAMAX) 70 MG tablet TAKE 1 TABLET BY MOUTH ONCE A WEEK ON AN EMPTY STOMACH WITH A FULL GLASS OF WATER   amLODipine (NORVASC) 10 MG tablet Take 1 tablet by mouth once daily   aspirin EC 81 MG tablet Take 81 mg by mouth daily. Swallow whole.   carvedilol (COREG) 3.125 MG tablet Take 1 tablet (3.125 mg total) by mouth 2 (two) times daily with a meal.   Evolocumab (REPATHA SURECLICK) 140 MG/ML SOAJ Inject 140 mg into the skin every 14 (fourteen) days.   losartan-hydrochlorothiazide (HYZAAR) 100-25 MG tablet Take 1 tablet by mouth once daily   [DISCONTINUED] levothyroxine (SYNTHROID) 112 MCG tablet Take 1 tablet (112 mcg total) by mouth daily before breakfast.   levothyroxine (SYNTHROID) 112 MCG tablet Take 1 tablet (112 mcg total) by mouth daily before breakfast.   No  facility-administered encounter medications on file as of 11/10/2022.    ALLERGIES: Allergies  Allergen Reactions   Atorvastatin Other (See Comments)    Muscle pain   VACCINATION STATUS: Immunization History  Administered Date(s) Administered   Influenza Split 12/29/2010, 11/10/2011   Influenza Whole 01/24/2009, 12/17/2009   Influenza,inj,Quad PF,6+ Mos 12/14/2012, 01/31/2014, 01/09/2015, 02/04/2016, 11/23/2016, 12/06/2017, 12/12/2018, 12/26/2019, 10/14/2021   Influenza-Unspecified 01/09/2021   Janssen (J&J) SARS-COV-2 Vaccination 11/28/2019   Moderna Sars-Covid-2 Vaccination 11/02/2019, 11/30/2019, 04/28/2020, 02/19/2021   PNEUMOCOCCAL CONJUGATE-20 10/21/2022   PPD Test 12/14/2012, 12/19/2012   Td 12/17/2009   Tdap 05/08/2020   Zoster Recombinant(Shingrix) 02/03/2018, 06/27/2018     HPI   Faith Hood is a patient with the above medical history. she was diagnosed with hypothyroidism at approximate age of 77 years (newly diagnosed in March 2022), which required subsequent initiation of thyroid hormone supplementation. she was given various doses of Levothyroxine since, currently on 88 micrograms. she reports compliance to this medication:  Taking it daily on empty stomach  with water, separated by >30 minutes before breakfast and other medications , and by at least 4 hours from calcium, iron, PPIs, multivitamins.  I reviewed patient's thyroid tests:  Lab Results  Component Value Date   TSH 2.650 11/05/2022   TSH 2.770 07/31/2022   TSH 4.630 (H) 04/07/2022   TSH 7.280 (H) 09/15/2021   TSH 1.640 05/12/2021   TSH 5.260 (H) 03/13/2021   TSH 2.929 12/04/2020   TSH 6.100 (H) 11/13/2020   TSH 5.550 (H) 08/27/2020   TSH 4.440 06/10/2020   FREET4 1.15 11/05/2022   FREET4 1.22 07/31/2022   FREET4 1.06 04/07/2022   FREET4 0.97 09/15/2021   FREET4 1.22 05/12/2021   FREET4 0.86 03/13/2021   FREET4 0.83 11/13/2020   FREET4 0.93 08/27/2020   FREET4 1.04 06/10/2020   FREET4 0.9  06/23/2018   She also had thyroid ultrasound in 08/2020 which showed multiple small nodules bilaterally, none of which met criteria for dedicated follow up or biopsy.  Pt denies feeling nodules in neck, hoarseness, dysphagia/odynophagia, SOB  with lying down.  she does family history of thyroid disorders in her sister (unsure of which but knows it was surgically removed).  She does report her sister was unable to tolerate the generic form of the hormone and has done well with branded Synthroid.  No family history of thyroid cancer.  No history of radiation therapy to head or neck.  No recent use of iodine supplements.  Denies use of Biotin containing supplements.  I reviewed her chart and she also has a history of HTN, HLD, transaminitis.   Review of systems  Constitutional: + Minimally fluctuating body weight,  current Body mass index is 26.5 kg/m. , + fatigue-improving , no subjective hyperthermia, no subjective hypothermia Eyes: no blurry vision, no xerophthalmia ENT: no sore throat, no nodules palpated in throat, no dysphagia/odynophagia, no hoarseness Cardiovascular: no chest pain, no shortness of breath, no palpitations, no leg swelling Respiratory: no cough, no shortness of breath Gastrointestinal: no nausea/vomiting/diarrhea Musculoskeletal: + generalized muscle/joint aches Skin: no rashes, no hyperemia Neurological: no tremors, + numbness/tingling/burning pain to bilateral hands and feet (having nerve conduction studies soon), no dizziness Psychiatric: no depression, no anxiety   Objective:   Objective     BP 125/76 (BP Location: Left Arm, Patient Position: Sitting, Cuff Size: Large)   Pulse 79   Ht 5\' 4"  (1.626 m)   Wt 154 lb 6.4 oz (70 kg)   BMI 26.50 kg/m  Wt Readings from Last 3 Encounters:  11/10/22 154 lb 6.4 oz (70 kg)  10/21/22 151 lb 1.9 oz (68.5 kg)  09/24/22 150 lb 12.8 oz (68.4 kg)    BP Readings from Last 3 Encounters:  11/10/22 125/76  10/21/22 (!)  146/84  09/24/22 132/76      Physical Exam- Limited  Constitutional:  Body mass index is 26.5 kg/m. , not in acute distress, normal state of mind Eyes:  EOMI, no exophthalmos Musculoskeletal: no gross deformities, strength intact in all four extremities, no gross restriction of joint movements Skin:  no rashes, no hyperemia Neurological: no tremor with outstretched hands   CMP ( most recent) CMP     Component Value Date/Time   NA 143 06/02/2022 1557   K 4.0 06/02/2022 1557   CL 101 06/02/2022 1557   CO2 24 06/02/2022 1557   GLUCOSE 84 06/02/2022 1557   GLUCOSE 89 12/04/2020 1428   BUN 18 06/02/2022 1557   CREATININE 0.92 06/02/2022 1557   CREATININE 0.79 11/28/2019 1449   CALCIUM 9.9 06/02/2022 1557   PROT 7.7 06/02/2022 1557   ALBUMIN 4.4 06/02/2022 1557   AST 15 06/02/2022 1557   ALT 13 06/02/2022 1557   ALKPHOS 74 06/02/2022 1557   BILITOT 0.4 06/02/2022 1557   GFRNONAA >60 12/04/2020 1428   GFRNONAA 80 11/28/2019 1449   GFRAA 93 11/28/2019 1449     Diabetic Labs (most recent): Lab Results  Component Value Date   HGBA1C 5.5 06/23/2018   HGBA1C 5.4 05/15/2016   HGBA1C 5.4 02/04/2016     Lipid Panel ( most recent) Lipid Panel     Component Value Date/Time   CHOL 151 06/02/2022 1557   TRIG 69 06/02/2022 1557   HDL 47 06/02/2022 1557   CHOLHDL 3.2 06/02/2022 1557   CHOLHDL 3.5 12/04/2020 1427   VLDL 13 12/04/2020 1427   LDLCALC 90 06/02/2022 1557   LDLCALC 143 (H) 09/14/2019 1340   LABVLDL 14 06/02/2022 1557       Lab Results  Component Value Date   TSH 2.650  11/05/2022   TSH 2.770 07/31/2022   TSH 4.630 (H) 04/07/2022   TSH 7.280 (H) 09/15/2021   TSH 1.640 05/12/2021   TSH 5.260 (H) 03/13/2021   TSH 2.929 12/04/2020   TSH 6.100 (H) 11/13/2020   TSH 5.550 (H) 08/27/2020   TSH 4.440 06/10/2020   FREET4 1.15 11/05/2022   FREET4 1.22 07/31/2022   FREET4 1.06 04/07/2022   FREET4 0.97 09/15/2021   FREET4 1.22 05/12/2021   FREET4 0.86  03/13/2021   FREET4 0.83 11/13/2020   FREET4 0.93 08/27/2020   FREET4 1.04 06/10/2020   FREET4 0.9 06/23/2018    Thyroid US from 09/06/20 CLINICAL DATA:  Hypothyroidism   EXAM: THYROID ULTRASOUND   TECHNIQUE: Ultrasound examination of the thyroid gland and adjacent soft tissues was performed.   COMPARISON:  07/19/2018 and previous back to 04/09/2014   FINDINGS: Parenchymal Echotexture: Moderately heterogenous   Isthmus: 1.4 cm thickness, previously 1.2   Right lobe: 6.1 x 1.2 x 2.3 cm, previously 4.7 x 2.4 x 2.7   Left lobe: 5.9 x 3 x 2.6 cm, previously 5 x 2.5 x 2.3   _________________________________________________________   Estimated total number of nodules >/= 1 cm: 3   Number of spongiform nodules >/=  2 cm not described below (TR1): 0   Number of mixed cystic and solid nodules >/= 1.5 cm not described below (TR2): 0   _________________________________________________________   Nodule # 1:   Prior biopsy: No   Location: Right; superior   Maximum size: 1.6 cm; Other 2 dimensions: 1.1 x 1.4 cm, previously, 1.4 x 1.2 x 1.4 cm   Composition: solid/almost completely solid (2)   Echogenicity: hyperechoic (1)   Shape: not taller-than-wide (0)   Margins: smooth (0)   Echogenic foci: none (0)   ACR TI-RADS total points: 3.   ACR TI-RADS risk category:  TR3 (3 points).   Significant change in size (>/= 20% in two dimensions and minimal increase of 2 mm): No   Change in features: No   Change in ACR TI-RADS risk category: No   ACR TI-RADS recommendations:   *Given size (>/= 1.5 - 2.4 cm) and appearance, a follow-up ultrasound in 1 year should be considered based on TI-RADS criteria.   _________________________________________________________   Nodule # 2: 0.6 cm calcification, mid left, stable; This nodule does NOT meet TI-RADS criteria for biopsy or dedicated follow-up.   _________________________________________________________   Nodule #  3:   Location: Left; superior   Maximum size: 1.2 cm; Other 2 dimensions: 0.8 x 0.9 cm   Composition: solid/almost completely solid (2)   Echogenicity: hyperechoic (1)   Shape: not taller-than-wide (0)   Margins: smooth (0)   Echogenic foci: none (0)   ACR TI-RADS total points: 3.   ACR TI-RADS risk category: TR3 (3 points).   ACR TI-RADS recommendations:   Given size (<1.4 cm) and appearance, this nodule does NOT meet TI-RADS criteria for biopsy or dedicated follow-up.   _________________________________________________________   Nodule # 4:   Location: Isthmus; right of midline   Maximum size: 1.2 cm; Other 2 dimensions: 1 x 0.8 cm   Composition: solid/almost completely solid (2)   Echogenicity: isoechoic (1)   Shape: not taller-than-wide (0)   Margins: ill-defined (0)   Echogenic foci: none (0)   ACR TI-RADS total points: 3.   ACR TI-RADS risk category: TR3 (3 points).   ACR TI-RADS recommendations:   Given size (<1.4 cm) and appearance, this nodule does NOT meet TI-RADS criteria for biopsy  or dedicated follow-up.   IMPRESSION: 1. Enlarged heterogenous thyroid with nodules as above. None meet criteria for biopsy. 2. Recommend annual/biennial ultrasound follow-up of right nodule as above, until stability x5 years confirmed.   The above is in keeping with the ACR TI-RADS recommendations - J Am Coll Radiol 2017;14:587-595.     Electronically Signed   By: Corlis Leak M.D.   On: 09/07/2020 08:13 --------------------------------------------------------------------------------------------------------- Thyroid US from 06/02/21 CLINICAL DATA:  Goiter.   EXAM: THYROID ULTRASOUND   TECHNIQUE: Ultrasound examination of the thyroid gland and adjacent soft tissues was performed.   COMPARISON:  09/06/2020, 07/19/2018   FINDINGS: Parenchymal Echotexture: Moderately heterogenous   Isthmus: 1.5 cm, previously 1.4 cm   Right lobe: 5.1 x 2.8 x 2.3 cm,  previously 6.1 x 1.2 x 2.3 cm   Left lobe: 4.6 x 2.5 x 2.7 cm, previously 5.9 x 3.0 x 2.6 cm   _________________________________________________________   Estimated total number of nodules >/= 1 cm: 3   Number of spongiform nodules >/=  2 cm not described below (TR1): 0   Number of mixed cystic and solid nodules >/= 1.5 cm not described below (TR2): 0   _________________________________________________________   Nodule # 1:   Prior biopsy: No   Location: Isthmus; Inferior   Maximum size: 1.4 cm; Other 2 dimensions: 1.0 x 0.9 cm, previously, 1.2 x 1.0 x 0.8 cm   Composition: solid/almost completely solid (2)   Echogenicity: hyperechoic (1)   Shape: not taller-than-wide (0)   Margins: ill-defined (0)   Echogenic foci: none (0)   ACR TI-RADS total points: 3.   ACR TI-RADS risk category:  TR3 (3 points).   Significant change in size (>/= 20% in two dimensions and minimal increase of 2 mm): No   Change in features: No   Change in ACR TI-RADS risk category: No   ACR TI-RADS recommendations:   Given size (<1.5 cm) and appearance, this nodule does NOT meet TI-RADS criteria for biopsy or dedicated follow-up.   _________________________________________________________   Nodule # 2 (previously labeled 1):   Prior biopsy: No   Location: Right; Superior   Maximum size: 1.5 cm; Other 2 dimensions: 1.3 x 1.2 cm, previously, 1.6 x 1.1 x 1.4 cm   Composition: solid/almost completely solid (2)   Echogenicity: hyperechoic (1)   Shape: not taller-than-wide (0)   Margins: smooth (0)   Echogenic foci: none (0)   ACR TI-RADS total points: 3.   ACR TI-RADS risk category:  TR3 (3 points).   Significant change in size (>/= 20% in two dimensions and minimal increase of 2 mm): No   Change in features: No   Change in ACR TI-RADS risk category: No   ACR TI-RADS recommendations:   *Given size (>/= 1.5 - 2.4 cm) and appearance, a follow-up ultrasound in 1 year  should be considered based on TI-RADS criteria.   _________________________________________________________   Nodule # 3:   Prior biopsy: No   Location: Left; Superior   Maximum size: 1.0 cm; Other 2 dimensions: 0.9 x 0.8 cm, previously, 1.2 x 0.8 x 0.9 cm   Composition: solid/almost completely solid (2)   Echogenicity: hyperechoic (1)   Shape: not taller-than-wide (0)   Margins: smooth (0)   Echogenic foci: none (0)   ACR TI-RADS total points: 3.   ACR TI-RADS risk category:  TR3 (3 points).   Significant change in size (>/= 20% in two dimensions and minimal increase of 2 mm): No   Change in features:  No   Change in ACR TI-RADS risk category: No   ACR TI-RADS recommendations:   Given size (<1.4 cm) and appearance, this nodule does NOT meet TI-RADS criteria for biopsy or dedicated follow-up.   _________________________________________________________   IMPRESSION: 1. Similar appearing enlarged, heterogenous thyroid gland. 2. Similar appearance of previously visualized right superior solid thyroid nodule (labeled 2, 1.5 cm, previously 1.6 cm) which again meets criteria (TI-RADS category 3) for 1 year ultrasound surveillance. This study marks 3 years stability. 3. The remaining visualized thyroid nodules appear benign and do not warrant additional follow-up.   The above is in keeping with the ACR TI-RADS recommendations - J Am Coll Radiol 2017;14:587-595.   Marliss Coots, MD   [facility]   [facility]     Electronically Signed   By: Marliss Coots M.D.   On: 06/02/2021 14:28   Latest Reference Range & Units 03/13/21 16:03 05/12/21 15:52 09/15/21 11:25 04/07/22 16:11 07/31/22 09:58 11/05/22 16:08  TSH 0.450 - 4.500 uIU/mL 5.260 (H) 1.640 7.280 (H) 4.630 (H) 2.770 2.650  T4,Free(Direct) 0.82 - 1.77 ng/dL 1.61 0.96 0.45 4.09 8.11 1.15  (H): Data is abnormally high  Assessment & Plan:   ASSESSMENT /  PLAN:  1. Hypothyroidism- r/t Hashimoto's thyroiditis  Patient with relatively new hypothyroidism, on levothyroxine therapy. Her antibody testing confirms suspicion of autoimmune thyroid dysfunction.    Her previsit thyroid function tests are consistent with appropriate hormone replacement and she feels much better!   She is advised to continue her Levothyroxine 112 mcg po daily before breakfast.  Will recheck TFTs prior to next visit and adjust dose further if needed.  - We discussed about correct intake of levothyroxine, at fasting, with water, separated by at least 30 minutes from breakfast, and separated by more than 4 hours from calcium, iron, multivitamins, acid reflux medications (PPIs). -Patient is made aware of the fact that thyroid hormone replacement is needed for life, dose to be adjusted by periodic monitoring of thyroid function tests.  2. Multinodular thyroid Multiple thyroid nodules recommending follow up with surveillance ultrasound in 1 year (around 05/2023).  Will order at next follow up visit.     I spent  12  minutes in the care of the patient today including review of labs from Thyroid Function, CMP, and other relevant labs ; imaging/biopsy records (current and previous including abstractions from other facilities); face-to-face time discussing  her lab results and symptoms, medications doses, her options of short and long term treatment based on the latest standards of care / guidelines;   and documenting the encounter.  Adeena P Mcquiston  participated in the discussions, expressed understanding, and voiced agreement with the above plans.  All questions were answered to her satisfaction. she is encouraged to contact clinic should she have any questions or concerns prior to her return visit.   FOLLOW UP PLAN:  Return in about 4 months (around 03/12/2023) for Thyroid follow up, Previsit labs.  Ronny Bacon, [facility] [address] [place], [place] [address] Phone: [phone] Fax: [phone]  11/10/2022, 10:42 AM

## 2022-11-19 ENCOUNTER — Ambulatory Visit (HOSPITAL_BASED_OUTPATIENT_CLINIC_OR_DEPARTMENT_OTHER): Payer: Medicare PPO | Admitting: Family

## 2022-11-19 ENCOUNTER — Encounter (HOSPITAL_BASED_OUTPATIENT_CLINIC_OR_DEPARTMENT_OTHER): Payer: Self-pay | Admitting: Family

## 2022-11-19 VITALS — BP 130/70 | HR 60 | Ht 64.0 in | Wt 153.5 lb

## 2022-11-19 DIAGNOSIS — I6523 Occlusion and stenosis of bilateral carotid arteries: Secondary | ICD-10-CM

## 2022-11-19 DIAGNOSIS — E785 Hyperlipidemia, unspecified: Secondary | ICD-10-CM | POA: Diagnosis not present

## 2022-11-19 DIAGNOSIS — I10 Essential (primary) hypertension: Secondary | ICD-10-CM

## 2022-11-19 MED ORDER — ROSUVASTATIN CALCIUM 5 MG PO TABS: 5.0000 mg | ORAL_TABLET | ORAL | 3 refills | Status: AC

## 2022-11-19 NOTE — Progress Notes (Signed)
Advanced Hypertension Clinic Assessment:    Date:  11/19/2022   ID:  Faith Hood, DOB [birth year], MRN [id number]  PCP:  Kerri Perches, MD  Cardiologist:  Little Ishikawa, MD  Nephrologist:  Referring MD: Kerri Perches, MD   CC: Hypertension  History of Present Illness:    Faith Hood is a 67 y.o. 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   Previously evaluated by Dr. Gaynelle Arabian 11/2020 for lightheadedness with BP uncontrolled on clonidine, losartan, amlodipine.  She was taking clonidine once daily and it was discontinued and amlodipine increased.  HCTZ also initiated.  Carotid Dopplers with bilateral less than 50% stenosis of the ICA.  Echo LVEF 60 to 65%, mild LVH.  Normal ABIs 11/2021.  Established with advanced hypertension clinic and Dr. Duke Salvia 08/2022.  Her BP was uncontrolled on amlodipine, carvedilol, losartan-HCTZ.  BP improved in the office and she was enrolled in [employer] RPM study.  Due to myalgias with statin and carotid stenosis she was started on Repatha.  Renal dopplers 10/15/2022 with no renal artery stenosis.  Presets today for follow up. Average BP over the last week via her readings in vivify 119/77 with range 117/71-121/79. She is hesitant regarding Repatha as prefers non-injectable therapy. Does note that she has a runny nose which she attributes to Repatha. Reports no shortness of breath nor dyspnea on exertion. Reports no chest pain, pressure, or tightness. No edema, orthopnea, PND. Reports no palpitations.  Following a heart healthy diet. Exercise limited by polyarteritis nervosa but motivated to be more active.  Previous antihypertensives: Clonidine Spironolactone  Past Medical History:  Diagnosis Date   Cerebral vascular disease    Carotid ultrasound in  10/2011: Mild plaque; tortuous vessels; no definite luminal obstruction.   Elevated LFTs    with positive ASMA and mildly elevated IgG, no biopsy pursued as LFTs normalized.   Hepatic steatosis    By CT scan   Hyperlipidemia    Hypertension    Hypothyroidism due to Hashimoto's thyroiditis 2021   Neuropathy    Overweight(278.02)    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HCC) 2021   Uterine leiomyoma    By CT scan    Past Surgical History:  Procedure Laterality Date   COLONOSCOPY  12/2007   Negative screening study   COLONOSCOPY N/A 08/17/2018   Surgeon: Corbin Ade, MD; normal exam.  Repeat in 2030.   DIAGNOSTIC LAPAROSCOPY  1978   Gynecologic problems    Current Medications: Current Meds  Medication Sig   alendronate (FOSAMAX) 70 MG tablet TAKE 1 TABLET BY MOUTH ONCE A WEEK ON AN EMPTY STOMACH WITH A FULL GLASS OF WATER   amLODipine (NORVASC) 10 MG tablet Take 1 tablet by mouth once daily   aspirin EC 81 MG tablet Take 81 mg by mouth daily. Swallow whole.   carvedilol (COREG) 3.125 MG tablet Take 1 tablet (3.125 mg total) by mouth 2 (two) times daily with a meal.   levothyroxine (SYNTHROID) 112 MCG tablet Take 1 tablet (112 mcg total) by mouth daily before breakfast.   losartan-hydrochlorothiazide (HYZAAR) 100-25 MG tablet Take 1 tablet by mouth once daily   [START ON 11/20/2022] rosuvastatin (CRESTOR) 5 MG tablet Take 1 tablet (5 mg total) by mouth 3 (three) times a week.   [DISCONTINUED] Evolocumab (REPATHA SURECLICK) 140 MG/ML SOAJ Inject 140 mg into the skin every  14 (fourteen) days.     Allergies:   Atorvastatin   Social History   Socioeconomic History   Marital status: Single    Spouse name: Not on file   Number of children: 0   Years of education: Not on file   Highest education level: Not on file  Occupational History   Occupation: Middle school teacher retired    Comment: X25 years  Tobacco Use   Smoking status: Never   Smokeless tobacco: Never  Vaping Use   Vaping  status: Never Used  Substance and Sexual Activity   Alcohol use: Not Currently    Comment: occasionally. About twice a year.    Drug use: No   Sexual activity: Not Currently  Other Topics Concern   Not on file  Social History Narrative   Lives with sister occasionally   Right Handed   Social Determinants of Health   Financial Resource Strain: Low Risk  (09/16/2022)   Overall Financial Resource Strain (CARDIA)    Difficulty of Paying Living Expenses: Not very hard  Food Insecurity: No Food Insecurity (09/16/2022)   Hunger Vital Sign    Worried About Running Out of Food in the Last Year: Never true    Ran Out of Food in the Last Year: Never true  Transportation Needs: No Transportation Needs (09/16/2022)   PRAPARE - Administrator, Civil Service (Medical): No    Lack of Transportation (Non-Medical): No  Physical Activity: Insufficiently Active (09/16/2022)   Exercise Vital Sign    Days of Exercise per Week: 3 days    Minutes of Exercise per Session: 20 min  Stress: Not on file  Social Connections: Not on file     Family History: The patient's family history includes Cancer in her maternal grandmother and paternal grandmother; Cancer (age of onset: 69) in her father; Hypertension in her mother, sister, sister, sister, and sister; Pancreatic cancer (age of onset: 68) in her mother; Stroke (age of onset: 48) in her sister. There is no history of Colon cancer or Breast cancer.  ROS:   Please see the history of present illness.     All other systems reviewed and are negative.  EKGs/Labs/Other Studies Reviewed:         Renal Artery Duplex 09/2022 Summary:  Largest Aortic Diameter: 2.3 cm    Renal:    Right: No evidence of right renal artery stenosis. RRV flow present.         Normal size right kidney. Normal cortical thickness of right         kidney. Abnormal right Resistive Index.  Left:  No evidence of left renal artery stenosis. LRV flow present.         Normal  size of left kidney. Normal cortical thickness of the         left kidney. Abnormal left Resisitve Index.  Mesenteric: Normal Celiac artery and Superior Mesenteric artery findings.  Recent Labs: 06/02/2022: ALT 13; BUN 18; Creatinine, Ser 0.92; Hemoglobin 12.2; Platelets 247; Potassium 4.0; Sodium 143 11/05/2022: TSH 2.650   Recent Lipid Panel    Component Value Date/Time   CHOL 151 06/02/2022 1557   TRIG 69 06/02/2022 1557   HDL 47 06/02/2022 1557   CHOLHDL 3.2 06/02/2022 1557   CHOLHDL 3.5 12/04/2020 1427   VLDL 13 12/04/2020 1427   LDLCALC 90 06/02/2022 1557   LDLCALC 143 (H) 09/14/2019 1340    Physical Exam:   VS:  BP 130/70   Pulse 60  Ht 5\' 4"  (1.626 m)   Wt 153 lb 8 oz (69.6 kg)   SpO2 98%   BMI 26.35 kg/m  , BMI Body mass index is 26.35 kg/m. GENERAL:  Well appearing HEENT: Pupils equal round and reactive, fundi not visualized, oral mucosa unremarkable NECK:  No jugular venous distention, waveform within normal limits, carotid upstroke brisk and symmetric, no bruits, no thyromegaly LYMPHATICS:  No cervical adenopathy LUNGS:  Clear to auscultation bilaterally HEART:  RRR.  PMI not displaced or sustained,S1 and S2 within normal limits, no S3, no S4, no clicks, no rubs, no murmurs ABD:  Flat, positive bowel sounds normal in frequency in pitch, no bruits, no rebound, no guarding, no midline pulsatile mass, no hepatomegaly, no splenomegaly EXT:  2 plus pulses throughout, no edema, no cyanosis no clubbing SKIN:  No rashes no nodules NEURO:  Cranial nerves II through XII grossly intact, motor grossly intact throughout PSYCH:  Cognitively intact, oriented to person place and time   ASSESSMENT/PLAN:    HTN- BP well controlled. Continue current antihypertensive regimen amlodipine 10 mg daily, losartan/HCTZ 100-25 mg daily, Coreg 3.125 mg twice daily. Given information for Right Start exercise program at [facility].  HLD-  Intolerance to Atorvastatin.  Previously had myalgias  on rosuvastatin but now she is uncertain whether was myalgias or just related to her rheumatologic conditions.. Notes runny nose with Repatha and would prefer to avoid injectable therapy.  We will stop Repatha and start rosuvastatin 5 mg 3 times per week per her preference.  Check LPA, lipid panel, LFTs 1 week prior to next office visit.  If LDL not at goal less than 70 could consider return to Repatha versus increasing rosuvastatin dose to 10 mg three times per week  Carotid stenosis - Duplex 09/2020 less than 50% narrowing bilaterally.  Lipid management, as above.  Polyarteritis nodosa-follows with rheumatology.  Limits physical activity.  Screening for Secondary Hypertension:     09/16/2022    8:40 AM  Causes  Drugs/Herbals Screened     - Comments Limits salt.  Rare caffeine.  No EtOH.  No NSAIDS  Renovascular HTN Screened     - Comments Check renal artery Dopplers  Sleep Apnea Screened     - Comments No symptoms  Thyroid Disease Screened  Hyperaldosteronism Screened     - Comments check renin/aldosterone  Pheochromocytoma Screened     - Comments No symptoms  Cushing's Syndrome N/A  Hyperparathyroidism Screened  Coarctation of the Aorta Screened     - Comments BP symmetric  Compliance Screened    Relevant Labs/Studies:    Latest Ref Rng & Units 06/02/2022    3:57 PM 10/21/2021    8:09 AM 04/22/2021    9:40 AM  Basic Labs  Sodium 134 - 144 mmol/L 143  134  141   Potassium 3.5 - 5.2 mmol/L 4.0  4.0  3.9   Creatinine 0.57 - 1.00 mg/dL 4.09  8.11  9.14        Latest Ref Rng & Units 11/05/2022    4:08 PM 07/31/2022    9:58 AM  Thyroid   TSH 0.450 - 4.500 uIU/mL 2.650  2.770        Latest Ref Rng & Units 09/16/2022    9:44 AM  Renin/Aldosterone   Aldosterone 0.0 - 30.0 ng/dL 8.2   Aldos/Renin Ratio 0.0 - 30.0 0.2              10/15/2022   10:00 AM  Renovascular  Renal Artery [place] Completed Yes        she consents to be monitored in our remote patient monitoring  program through [employer].  she will track his blood pressure twice daily and understands that these trends will help [place] to adjust her medications as needed prior to his next appointment.    Disposition:    FU with MD/PharmD in 2 months    Medication Adjustments/Labs and Tests Ordered: Current medicines are reviewed at length with the patient today.  Concerns regarding medicines are outlined above.  Orders Placed This Encounter  Procedures   Lipoprotein A (LPA)   Lipid panel   Hepatic function panel   Meds ordered this encounter  Medications   rosuvastatin (CRESTOR) 5 MG tablet    Sig: Take 1 tablet (5 mg total) by mouth 3 (three) times a week.    Dispense:  40 tablet    Refill:  3    Order Specific Question:   Supervising Provider    Answer:   Jodelle Red [[id number]]     Signed, Alver Sorrow, NP  11/19/2022 8:33 AM    [facility]

## 2022-11-19 NOTE — Patient Instructions (Addendum)
Medication Instructions:  Your physician has recommended you make the following change in your medication:   Stop: Repatha  Start: Rosuvastatin 5mg  3 times per week *If you need a refill on your cardiac medications before your next appointment, please call your pharmacy*   Lab Work: Your physician recommends that you return for lab work one week before follow up visit for Fasting Lipid Panel, Liver Function Tests, and Lipoprotein A    Follow-Up: At [facility], you and your health needs are our priority.  As part of our continuing mission to provide you with exceptional heart care, we have created designated Provider Care Teams.  These Care Teams include your primary Cardiologist (physician) and Advanced Practice Providers (APPs -  Physician Assistants and Nurse Practitioners) who all work together to provide you with the care you need, when you need it.  We recommend signing up for the patient portal called "MyChart".  Sign up information is provided on this After Visit Summary.  MyChart is used to connect with patients for Virtual Visits (Telemedicine).  Patients are able to view lab/test results, encounter notes, upcoming appointments, etc.  Non-urgent messages can be sent to your provider as well.   To learn more about what you can do with MyChart, go to [email or web address].    Your next appointment:   Follow up as scheduled with Dr. Duke Salvia   Other Instructions Please bring your blood pressure cuff with you to your next visit.

## 2022-12-07 ENCOUNTER — Telehealth: Payer: Self-pay

## 2022-12-07 DIAGNOSIS — Z0000 Encounter for general adult medical examination without abnormal findings: Secondary | ICD-10-CM

## 2022-12-07 NOTE — Telephone Encounter (Signed)
Called patient to determine if she has questions about her medications per her survey response in [employer] to give her the appropriate contact information and/or advise to call her pharmacy. Patient did not answer. Was not able to leave a voicemail.    Renaee Munda, MS, ERHD, [facility]  Care Guide, Health & Wellness Coach [address], [address] [place] [address] Telephone: [phone] Email: [email or web address]

## 2022-12-15 ENCOUNTER — Other Ambulatory Visit: Payer: Self-pay

## 2022-12-15 MED ORDER — ALENDRONATE SODIUM 70 MG PO TABS: ORAL_TABLET | ORAL | 0 refills | Status: DC

## 2023-01-07 ENCOUNTER — Other Ambulatory Visit: Payer: Self-pay | Admitting: Family Medicine

## 2023-01-08 ENCOUNTER — Other Ambulatory Visit: Payer: Self-pay

## 2023-01-08 MED ORDER — ALENDRONATE SODIUM 70 MG PO TABS: ORAL_TABLET | ORAL | 0 refills | Status: DC

## 2023-01-24 ENCOUNTER — Other Ambulatory Visit (INDEPENDENT_AMBULATORY_CARE_PROVIDER_SITE_OTHER): Payer: Self-pay

## 2023-01-27 ENCOUNTER — Encounter (HOSPITAL_BASED_OUTPATIENT_CLINIC_OR_DEPARTMENT_OTHER): Payer: Self-pay | Admitting: Cardiovascular Disease

## 2023-01-27 ENCOUNTER — Ambulatory Visit (HOSPITAL_BASED_OUTPATIENT_CLINIC_OR_DEPARTMENT_OTHER): Payer: Medicare PPO | Admitting: Cardiovascular Disease

## 2023-01-27 VITALS — BP 142/68 | HR 78 | Ht 64.0 in | Wt 150.8 lb

## 2023-01-27 DIAGNOSIS — I1A0 Resistant hypertension: Secondary | ICD-10-CM | POA: Diagnosis not present

## 2023-01-27 DIAGNOSIS — Z5181 Encounter for therapeutic drug level monitoring: Secondary | ICD-10-CM | POA: Diagnosis not present

## 2023-01-27 DIAGNOSIS — E7849 Other hyperlipidemia: Secondary | ICD-10-CM

## 2023-01-27 NOTE — Patient Instructions (Signed)
Medication Instructions:  Your physician recommends that you continue on your current medications as directed. Please refer to the Current Medication list given to you today.   *If you need a refill on your cardiac medications before your next appointment, please call your pharmacy*  Lab Work: LP/CMET TODAY   If you have labs (blood work) drawn today and your tests are completely normal, you will receive your results only by: MyChart Message (if you have MyChart) OR A paper copy in the mail If you have any lab test that is abnormal or we need to change your treatment, we will call you to review the results.  Testing/Procedures: NONE   Follow-Up: At [facility], you and your health needs are our priority.  As part of our continuing mission to provide you with exceptional heart care, we have created designated Provider Care Teams.  These Care Teams include your primary Cardiologist (physician) and Advanced Practice Providers (APPs -  Physician Assistants and Nurse Practitioners) who all work together to provide you with the care you need, when you need it.  We recommend signing up for the patient portal called "MyChart".  Sign up information is provided on this After Visit Summary.  MyChart is used to connect with patients for Virtual Visits (Telemedicine).  Patients are able to view lab/test results, encounter notes, upcoming appointments, etc.  Non-urgent messages can be sent to your provider as well.   To learn more about what you can do with MyChart, go to [email or web address].    Your next appointment:   12 month(s) IN REGULAR CLINIC   Provider:   Chilton Si, MD or Gillian Shields, NP

## 2023-01-27 NOTE — Progress Notes (Signed)
[facility]pert[facility] Initial Assessment:    Date:  01/27/2023   ID:  Faith Hood, DOB 05-26-1955, MRN [id number]  PCP:  Kerri Perches, MD  Cardiologist:  Little Ishikawa, MD  Nephrologist:  Referring MD: Kerri Perches, MD   CC: Hypertension  History of Present Illness:    Faith Hood is a 67 y.o. female with a hx of hypertension, hyperlipidemia, hypothyroidism secondary to Hashimoto's thyroiditis, arthritis, osteoporosis on Fosamax, polyarteritis nervosa, here to establish care in the [facility]. SHe was first diagnosed with hypertension in 2009 and recently it has been more difficult to control..    She saw Dr. Lodema Hong 06/11/2022 and her blood pressure was 160/90. She has resistant hypertension despite compliance of amlodipine 10 mg daily, losartan-HCTZ 100-25 mg daily, and spironolactone. Spironolactone was stopped at that visit and she was prescribed carvedilol 3.125 mg daily. Follows with endocrinology for hypothyroidism, on levothyroxine 100 mcg. Has also been seen by neurology 06/2022 for progressive worsening of LE numbness. She was advised to restart Lyrica if her pain worsened.   She previously saw Dr. Bjorn Pippin 11/2020 for lightheadedness. At that visit her blood pressure was uncontrolled on clonidine, losartan, and amlodipine. She was taking clonidine once a day so it was discontinued, and amlodipine was increased. HCTZ was also initiated at that visit. She had carotid dopplers that showed less than 50% stenosis of the internal carotid arteries. Echo revealed LVEF 60-65% with mild LVH. She had normal ABIs 11/2021.  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 She has pain and numbness in her feet.  BP was slightly above goal in the office but better at home.  She was enrolled in our remote patient monitoring sutdy and referred to the PREP program.  She had myalgias on low dose rosuvastatin.  She was started on  Repatha. Renal Dopplers were normal 09/2022.  She saw Gillian Shields, NP 10/2022 and her home average on RPM was 119/77.  Repatha caused a runny nose so she switched back to rosuvastatin 5mg  three times weekly.  Faith Hood reports feeling generally well. She expresses some disappointment that her home blood pressure readings are consistently lower than those taken in the office, despite having verified the accuracy of her home blood pressure monitor. The patient's home readings are typically around 119-123 mmHg. She acknowledges recent stressful situations that may have contributed to elevated blood pressure.  She admits to not exercising as much as she should due to work and personal commitments, including caring for an older relative. She recognizes the need to prioritize her own health and plans to schedule regular exercise sessions.  Faith Hood is adherent to her blood pressure and thyroid medications but occasionally forgets to take her aspirin and cholesterol medication. She expresses uncertainty about taking all her medications together, despite reassurances from her doctor that this is acceptable. She has stopped taking carvedilol, a twice-daily medication, due to forgetfulness and a suspicion that it may be contributing to increased bone aches.  She also reports the development of a rash, initially on the knee, which she believes is related to her autoimmune condition. She has previously been treated with prednisone for a similar rash and has an upcoming appointment with her rheumatologist.      Previous antihypertensives: Clonidine Spironolactone  Past Medical History:  Diagnosis Date   Cerebral vascular disease    Carotid ultrasound in 10/2011: Mild plaque; tortuous vessels; no definite luminal obstruction.   Elevated LFTs  with positive ASMA and mildly elevated IgG, no biopsy pursued as LFTs normalized.   Hepatic steatosis    By CT scan   Hyperlipidemia    Hypertension     Hypothyroidism due to Hashimoto's thyroiditis 2021   Neuropathy    Overweight(278.02)    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HCC) 2021   Uterine leiomyoma    By CT scan    Past Surgical History:  Procedure Laterality Date   COLONOSCOPY  12/2007   Negative screening study   COLONOSCOPY N/A 08/17/2018   Surgeon: Corbin Ade, MD; normal exam.  Repeat in 2030.   DIAGNOSTIC LAPAROSCOPY  1978   Gynecologic problems    Current Medications: Current Meds  Medication Sig   alendronate (FOSAMAX) 70 MG tablet TAKE 1 TABLET BY MOUTH ONCE A WEEK ON AN EMPTY STOMACH WITH A FULL GLASS OF WATER   amLODipine (NORVASC) 10 MG tablet Take 1 tablet by mouth once daily   aspirin EC 81 MG tablet Take 81 mg by mouth daily. Swallow whole.   levothyroxine (SYNTHROID) 112 MCG tablet Take 1 tablet (112 mcg total) by mouth daily before breakfast.   losartan-hydrochlorothiazide (HYZAAR) 100-25 MG tablet Take 1 tablet by mouth once daily   rosuvastatin (CRESTOR) 5 MG tablet Take 1 tablet (5 mg total) by mouth 3 (three) times a week.     Allergies:   Atorvastatin   Social History   Socioeconomic History   Marital status: Single    Spouse name: Not on file   Number of children: 0   Years of education: Not on file   Highest education level: Not on file  Occupational History   Occupation: Middle school teacher retired    Comment: X25 years  Tobacco Use   Smoking status: Never   Smokeless tobacco: Never  Vaping Use   Vaping status: Never Used  Substance and Sexual Activity   Alcohol use: Not Currently    Comment: occasionally. About twice a year.    Drug use: No   Sexual activity: Not Currently  Other Topics Concern   Not on file  Social History Narrative   Lives with sister occasionally   Right Handed   Social Determinants of Health   Financial Resource Strain: Low Risk  (09/16/2022)   Overall Financial Resource Strain (CARDIA)    Difficulty of Paying Living Expenses: Not very hard  Food Insecurity:  No Food Insecurity (09/16/2022)   Hunger Vital Sign    Worried About Running Out of Food in the Last Year: Never true    Ran Out of Food in the Last Year: Never true  Transportation Needs: No Transportation Needs (09/16/2022)   PRAPARE - Administrator, Civil Service (Medical): No    Lack of Transportation (Non-Medical): No  Physical Activity: Insufficiently Active (09/16/2022)   Exercise Vital Sign    Days of Exercise per Week: 3 days    Minutes of Exercise per Session: 20 min  Stress: Not on file  Social Connections: Not on file     Family History: The patient's family history includes Cancer in her maternal grandmother and paternal grandmother; Cancer (age of onset: 62) in her father; Hypertension in her mother, sister, sister, sister, and sister; Pancreatic cancer (age of onset: 74) in her mother; Stroke (age of onset: 30) in her sister. There is no history of Colon cancer or Breast cancer.  ROS:   Please see the history of present illness.    (+) Numbness of bilateral feet (+) Myalgias  on rosuvastatin All other systems reviewed and are negative.  EKGs/Labs/Other Studies Reviewed:    ABIs 12/19/2021: Summary:  Right: Resting right ankle-brachial index is within normal range. The  right toe-brachial index is normal.   Left: Resting left ankle-brachial index is within normal range. The left  toe-brachial index is normal.   Echo  01/15/2021:  1. Left ventricular ejection fraction, by estimation, is 60 to 65%. The  left ventricle has normal function. The left ventricle has no regional  wall motion abnormalities. There is mild left ventricular hypertrophy.  Left ventricular diastolic parameters  are indeterminate.   2. Right ventricular systolic function is normal. The right ventricular  size is normal. Tricuspid regurgitation signal is inadequate for assessing  PA pressure.   3. The mitral valve is normal in structure. No evidence of mitral valve  regurgitation.  No evidence of mitral stenosis.   4. The aortic valve is tricuspid. There is mild calcification of the  aortic valve. There is mild thickening of the aortic valve. Aortic valve  regurgitation is trivial. No aortic stenosis is present.   5. The inferior vena cava is normal in size with greater than 50%  respiratory variability, suggesting right atrial pressure of 3 mmHg.   Carotid Dopplers  09/23/2020: RIGHT CAROTID ARTERY: No significant atheromatous plaque.   RIGHT VERTEBRAL ARTERY:  Antegrade flow.   LEFT CAROTID ARTERY: Moderate heterogeneous plaque at the carotid bifurcation with Doppler measurements indicative of less than 50% stenosis.   IMPRESSION: Less than 50% stenosis of the internal carotid arteries.   EKG:  EKG is personally reviewed. 09/16/2022: Sinus bradycardia.  Sinus arrhythmia.   Rate 57 bpm.  Recent Labs: 06/02/2022: ALT 13; BUN 18; Creatinine, Ser 0.92; Hemoglobin 12.2; Platelets 247; Potassium 4.0; Sodium 143 11/05/2022: TSH 2.650   Recent Lipid Panel    Component Value Date/Time   CHOL 151 06/02/2022 1557   TRIG 69 06/02/2022 1557   HDL 47 06/02/2022 1557   CHOLHDL 3.2 06/02/2022 1557   CHOLHDL 3.5 12/04/2020 1427   VLDL 13 12/04/2020 1427   LDLCALC 90 06/02/2022 1557   LDLCALC 143 (H) 09/14/2019 1340    Physical Exam:    VS:  BP (!) 142/68   Pulse 78   Ht 5\' 4"  (1.626 m)   Wt 150 lb 12.8 oz (68.4 kg)   SpO2 99%   BMI 25.88 kg/m  , BMI Body mass index is 25.88 kg/m. GENERAL:  Well appearing HEENT: Pupils equal round and reactive, fundi not visualized, oral mucosa unremarkable NECK:  No jugular venous distention, waveform within normal limits, carotid upstroke brisk and symmetric, no bruits, no thyromegaly LUNGS:  Clear to auscultation bilaterally HEART:  RRR.  PMI not displaced or sustained, S1 and S2 within normal limits, no S3, no S4, no clicks, no rubs, no murmurs ABD:  Flat, positive bowel sounds normal in frequency in pitch, no bruits, no  rebound, no guarding, no midline pulsatile mass, no hepatomegaly, no splenomegaly EXT:  2 plus pulses throughout, no edema, no cyanosis, no clubbing SKIN:  L thigh rash NEURO:  Cranial nerves II through XII grossly intact, motor grossly intact throughout PSYCH:  Cognitively intact, oriented to person place and time  ASSESSMENT/PLAN:    # Hypertension Blood pressure readings at home are generally lower than in the office. Patient has stopped taking Carvedilol due to perceived side effects. Currently on Amlodipine and Losartan-Hydrochlorothiazide combo. -Encouraged patient to continue monitoring blood pressure at home and aim for readings under 130/80. -  Continue Amlodipine and Losartan-Hydrochlorothiazide combo. -Increase exercise to 150 minutes weekly.  # Hyperlipidemia Patient reports inconsistent use of Rosuvastatin, taking it approximately three times a week. -Encouraged patient to use a pill box to improve medication adherence. -Check cholesterol and comprehensive metabolic panel today.  General Health Maintenance / Followup Plans -Encouraged patient to prioritize self-care and regular exercise. -Plan to follow up in one year, or sooner if needed.     Screening for Secondary Hypertension:     09/16/2022    8:40 AM  Causes  Drugs/Herbals Screened     - Comments Limits salt.  Rare caffeine.  No EtOH.  No NSAIDS  Renovascular HTN Screened     - Comments Check renal artery Dopplers  Sleep Apnea Screened     - Comments No symptoms  Thyroid Disease Screened  Hyperaldosteronism Screened     - Comments check renin/aldosterone  Pheochromocytoma Screened     - Comments No symptoms  Cushing's Syndrome N/A  Hyperparathyroidism Screened  Coarctation of the Aorta Screened     - Comments BP symmetric  Compliance Screened    Relevant Labs/Studies:    Latest Ref Rng & Units 06/02/2022    3:57 PM 10/21/2021    8:09 AM 04/22/2021    9:40 AM  Basic Labs  Sodium 134 - 144 mmol/L 143  134   141   Potassium 3.5 - 5.2 mmol/L 4.0  4.0  3.9   Creatinine 0.57 - 1.00 mg/dL 1.61  0.96  0.45        Latest Ref Rng & Units 11/05/2022    4:08 PM 07/31/2022    9:58 AM  Thyroid   TSH 0.450 - 4.500 uIU/mL 2.650  2.770        Latest Ref Rng & Units 09/16/2022    9:44 AM  Renin/Aldosterone   Aldosterone 0.0 - 30.0 ng/dL 8.2   Aldos/Renin Ratio 0.0 - 30.0 0.2              10/15/2022   10:00 AM  Renovascular   Renal Artery [place] Completed Yes     Medication Adjustments/Labs and Tests Ordered: Current medicines are reviewed at length with the patient today.  Concerns regarding medicines are outlined above.   Orders Placed This Encounter  Procedures   Lipid panel   Comprehensive metabolic panel   No orders of the defined types were placed in this encounter.  Dispo: F/u 1 year  Signed, Chilton Si, MD  01/27/2023 9:26 AM    [facility]

## 2023-01-28 LAB — LIPID PANEL
Chol/HDL Ratio: 4.6 {ratio} — ABNORMAL HIGH (ref 0.0–4.4)
Cholesterol, Total: 166 mg/dL (ref 100–199)
HDL: 36 mg/dL — ABNORMAL LOW (ref >39)
LDL Chol Calc (NIH): 115 mg/dL — ABNORMAL HIGH (ref 0–99)
Triglycerides: 79 mg/dL (ref 0–149)
VLDL Cholesterol Cal: 15 mg/dL (ref 5–40)

## 2023-01-28 LAB — COMPREHENSIVE METABOLIC PANEL
ALT: 9 [IU]/L (ref 0–32)
AST: 19 [IU]/L (ref 0–40)
Albumin: 4.1 g/dL (ref 3.9–4.9)
Alkaline Phosphatase: 92 [IU]/L (ref 44–121)
BUN/Creatinine Ratio: 11 — ABNORMAL LOW (ref 12–28)
BUN: 11 mg/dL (ref 8–27)
Bilirubin Total: 0.4 mg/dL (ref 0.0–1.2)
CO2: 25 mmol/L (ref 20–29)
Calcium: 8.8 mg/dL (ref 8.7–10.3)
Chloride: 102 mmol/L (ref 96–106)
Creatinine, Ser: 0.96 mg/dL (ref 0.57–1.00)
Globulin, Total: 3.1 g/dL (ref 1.5–4.5)
Glucose: 94 mg/dL (ref 70–99)
Potassium: 3.8 mmol/L (ref 3.5–5.2)
Sodium: 139 mmol/L (ref 134–144)
Total Protein: 7.2 g/dL (ref 6.0–8.5)
eGFR: 66 mL/min/{1.73_m2} (ref >59)

## 2023-02-12 NOTE — Progress Notes (Unsigned)
Referring Provider: Kerri Perches, MD Primary Care Physician:  Kerri Perches, MD Primary GI Physician: Dr. Jena Gauss  Chief Complaint  Patient presents with   Abdominal Pain    Stomach hurts, feels week, can't eat     HPI:   Faith Hood is a 67 y.o. female with history of cerebrovascular disease, HTN, HLD, hypothyroidism due to Hashimoto's thyroiditis, elevated LFTs with positive ASMA and mildly elevated IgG, but no biopsy pursued as LFTs normalized after treatment for polyarteritis nodosa (steroids), presenting today with chief complaint of fatigue, lack of appetite, nausea.  Last seen in the office August 2024 and was doing well at that time with no significant GI symptoms.  Recommended routine monitoring of LFTs with PCP, liver biopsy if LFTs begin to increase, follow-up in our office as needed.  Most recent labs 01/27/2023 with LFTs within normal limits.   Today:  Loss of appetite, fatigue, nausea without vomiting for the last 3 weeks. Down a few lbs.  Having some light headedness but then states its hard to describe.  Not like she is going to pass out. Like an outer body experience.  This sensation last all day.  No confusion, disorientation, difficulty thinking. Feels like something isn't right.  No headache.   No abdominal pain. Had some ache in the hip area but this resolved. About 1 month ago, had a flare of rash with what looks like bruises on her legs similar to polyarteritis nodosa. States they will raise up some and be sore for a few days or itch.  Does not have follow-up with rheumatology until February and has not reached out to them yet.  Also reports a couple episodes of mild night sweats.   Has numbness and tingling in her hands and feet. Has seen neurology for this and was prescribed gabapentin, but states this knocked her out.  No BRBPR, melena.  No real change in bowel habits.  As she has not been eating as much, she has had some decrease in bowel  frequency.  Reports she does feel that her symptoms are similar to her symptoms prior to diagnosis of polyarteritis nodosa.   Past Medical History:  Diagnosis Date   Cerebral vascular disease    Carotid ultrasound in 10/2011: Mild plaque; tortuous vessels; no definite luminal obstruction.   Elevated LFTs    with positive ASMA and mildly elevated IgG, no biopsy pursued as LFTs normalized.   Hepatic steatosis    By CT scan   Hyperlipidemia    Hypertension    Hypothyroidism due to Hashimoto's thyroiditis 2021   Neuropathy    Overweight(278.02)    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HCC) 2021   Uterine leiomyoma    By CT scan    Past Surgical History:  Procedure Laterality Date   COLONOSCOPY  12/2007   Negative screening study   COLONOSCOPY N/A 08/17/2018   Surgeon: Corbin Ade, MD; normal exam.  Repeat in 2030.   DIAGNOSTIC LAPAROSCOPY  1978   Gynecologic problems    Current Outpatient Medications  Medication Sig Dispense Refill   alendronate (FOSAMAX) 70 MG tablet TAKE 1 TABLET BY MOUTH ONCE A WEEK ON AN EMPTY STOMACH WITH A FULL GLASS OF WATER 4 tablet 0   amLODipine (NORVASC) 10 MG tablet Take 1 tablet by mouth once daily 90 tablet 0   aspirin EC 81 MG tablet Take 81 mg by mouth daily. Swallow whole.     levothyroxine (SYNTHROID) 112 MCG tablet Take 1 tablet (112  mcg total) by mouth daily before breakfast. 90 tablet 1   losartan-hydrochlorothiazide (HYZAAR) 100-25 MG tablet Take 1 tablet by mouth once daily 90 tablet 0   rosuvastatin (CRESTOR) 5 MG tablet Take 1 tablet (5 mg total) by mouth 3 (three) times a week. 40 tablet 3   No current facility-administered medications for this visit.    Allergies as of 02/18/2023 - Review Complete 02/18/2023  Allergen Reaction Noted   Atorvastatin Other (See Comments) 04/01/2022    Family History  Problem Relation Age of Onset   Pancreatic cancer Mother 70       Diagnosed in 2009; currently in hospice pt. htn   Hypertension Mother         + Sister x4   Cancer Father 52       brain tumor   Hypertension Sister    Hypertension Sister    Hypertension Sister    Stroke Sister 32   Hypertension Sister    Cancer Maternal Grandmother    Cancer Paternal Grandmother    Colon cancer Neg Hx    Breast cancer Neg Hx     Social History   Socioeconomic History   Marital status: Single    Spouse name: Not on file   Number of children: 0   Years of education: Not on file   Highest education level: Not on file  Occupational History   Occupation: Middle school teacher retired    Comment: X25 years  Tobacco Use   Smoking status: Never   Smokeless tobacco: Never  Vaping Use   Vaping status: Never Used  Substance and Sexual Activity   Alcohol use: Not Currently    Comment: occasionally. About twice a year.    Drug use: No   Sexual activity: Not Currently  Other Topics Concern   Not on file  Social History Narrative   Lives with sister occasionally   Right Handed   Social Drivers of Health   Financial Resource Strain: Low Risk  (09/16/2022)   Overall Financial Resource Strain (CARDIA)    Difficulty of Paying Living Expenses: Not very hard  Food Insecurity: No Food Insecurity (09/16/2022)   Hunger Vital Sign    Worried About Running Out of Food in the Last Year: Never true    Ran Out of Food in the Last Year: Never true  Transportation Needs: No Transportation Needs (09/16/2022)   PRAPARE - Administrator, Civil Service (Medical): No    Lack of Transportation (Non-Medical): No  Physical Activity: Insufficiently Active (09/16/2022)   Exercise Vital Sign    Days of Exercise per Week: 3 days    Minutes of Exercise per Session: 20 min  Stress: Not on file  Social Connections: Not on file    Review of Systems: Gen: Denies fever, chills, cold or flulike symptoms. CV: Denies chest pain, palpitations. Resp: Denies dyspnea, cough. GI: See HPI  Derm: See HPI Heme: See HPI  Physical Exam: BP 124/74 (BP  Location: Right Arm, Patient Position: Sitting, Cuff Size: Normal)   Pulse 75   Temp 97.9 F (36.6 C) (Temporal)   Ht 5\' 3"  (1.6 m)   Wt 147 lb 9.6 oz (67 kg)   BMI 26.15 kg/m  General:   Alert and oriented. No distress noted. Pleasant and cooperative.  Head:  Normocephalic and atraumatic. Eyes:  Conjuctiva clear without scleral icterus. Heart:  S1, S2 present without murmurs appreciated. Lungs:  Clear to auscultation bilaterally. No wheezes, rales, or rhonchi. No distress.  Abdomen:  +BS, soft, non-tender and non-distended. No rebound or guarding. No HSM or masses noted. Msk:  Symmetrical without gross deformities. Normal posture. Extremities:  Without edema. Neurologic:  Alert and  oriented x4 Skin: Flat areas of darker pigmentation on the thighs that resemble bruises.  No tenderness, erythema, scaling. Psych:  Normal mood and affect.    Assessment:  67 y.o. female with history of cerebrovascular disease, HTN, HLD, hypothyroidism due to Hashimoto's thyroiditis, polyarteritis nodosa, elevated LFTs with positive ASMA and mildly elevated IgG, but no biopsy pursued as LFTs normalized after treatment for polyarteritis nodosa (steroids), presenting today with with 3 weeks of fatigue, loss of appetite, nausea without vomiting.  Also reporting a strange sensation in her head, like an "out of body experience", couple episodes of night sweats, rash on her thighs that looks similar to prior rash, and ongoing numbness/tingling in her hands and feet for which she has seen neurology. Overall, aside from the numbness and tingling, she feels symptoms are similar to polyarteritis nodosa prior to steroid treatment.  Her abdominal exam is completely benign and she denies any other significant GI symptoms.   At this point, I will recheck labs and have recommended she reach out to her rheumatologist as all of her symptoms may be related to flare of polyarteritis nodosa.   Plan:  CBC, CMP, TSH, vitamin  B12, vitamin D.  Further recommendations to follow. Advised patient to reach out to rheumatology to arrange follow-up to discuss her current symptoms.   Ermalinda Memos, PA-C [facility] 02/18/2023

## 2023-02-18 ENCOUNTER — Ambulatory Visit: Payer: Medicare PPO | Admitting: Gastroenterology

## 2023-02-18 ENCOUNTER — Encounter: Payer: Self-pay | Admitting: Gastroenterology

## 2023-02-18 VITALS — BP 124/74 | HR 75 | Temp 97.9°F | Ht 63.0 in | Wt 147.6 lb

## 2023-02-18 DIAGNOSIS — R200 Anesthesia of skin: Secondary | ICD-10-CM

## 2023-02-18 DIAGNOSIS — R5383 Other fatigue: Secondary | ICD-10-CM

## 2023-02-18 DIAGNOSIS — R202 Paresthesia of skin: Secondary | ICD-10-CM

## 2023-02-18 DIAGNOSIS — R630 Anorexia: Secondary | ICD-10-CM

## 2023-02-18 DIAGNOSIS — R112 Nausea with vomiting, unspecified: Secondary | ICD-10-CM

## 2023-02-18 NOTE — Patient Instructions (Addendum)
Please have blood work completed at [facility].  We will call you with results and further recommendations.  I would recommend that you go ahead and reach back out to your rheumatologist to let them know of your symptoms to see if you can get in sooner for follow-up.   It was good to see you today!  I am sorry you are not feeling well!  Ermalinda Memos, PA-C [facility]

## 2023-02-22 ENCOUNTER — Other Ambulatory Visit: Payer: Self-pay

## 2023-02-22 DIAGNOSIS — R5383 Other fatigue: Secondary | ICD-10-CM

## 2023-02-22 DIAGNOSIS — R630 Anorexia: Secondary | ICD-10-CM

## 2023-02-24 ENCOUNTER — Other Ambulatory Visit (INDEPENDENT_AMBULATORY_CARE_PROVIDER_SITE_OTHER): Payer: Self-pay

## 2023-02-24 LAB — COMPLETE METABOLIC PANEL WITH GFR
AG Ratio: 1.4 (calc) (ref 1.0–2.5)
ALT: 9 U/L (ref 6–29)
AST: 14 U/L (ref 10–35)
Albumin: 4 g/dL (ref 3.6–5.1)
Alkaline phosphatase (APISO): 73 U/L (ref 37–153)
BUN: 16 mg/dL (ref 7–25)
CO2: 28 mmol/L (ref 20–32)
Calcium: 9.7 mg/dL (ref 8.6–10.4)
Chloride: 105 mmol/L (ref 98–110)
Creat: 0.82 mg/dL (ref 0.50–1.05)
Globulin: 2.9 g/dL (ref 1.9–3.7)
Glucose, Bld: 84 mg/dL (ref 65–139)
Potassium: 3.7 mmol/L (ref 3.5–5.3)
Sodium: 141 mmol/L (ref 135–146)
Total Bilirubin: 0.4 mg/dL (ref 0.2–1.2)
Total Protein: 6.9 g/dL (ref 6.1–8.1)
eGFR: 79 mL/min/{1.73_m2} (ref >=60)

## 2023-02-24 LAB — VITAMIN D 1,25 DIHYDROXY
Vitamin D 1, 25 (OH)2 Total: 42 pg/mL (ref 18–72)
Vitamin D2 1, 25 (OH)2: 15 pg/mL
Vitamin D3 1, 25 (OH)2: 27 pg/mL

## 2023-02-24 LAB — CBC WITH DIFFERENTIAL/PLATELET
Absolute Lymphocytes: 1034 {cells}/uL (ref 850–3900)
Absolute Monocytes: 259 {cells}/uL (ref 200–950)
Basophils Absolute: 22 {cells}/uL (ref 0–200)
Basophils Relative: 0.4 %
Eosinophils Absolute: 99 {cells}/uL (ref 15–500)
Eosinophils Relative: 1.8 %
HCT: 32.4 % — ABNORMAL LOW (ref 35.0–45.0)
Hemoglobin: 10.3 g/dL — ABNORMAL LOW (ref 11.7–15.5)
MCH: 27.1 pg (ref 27.0–33.0)
MCHC: 31.8 g/dL — ABNORMAL LOW (ref 32.0–36.0)
MCV: 85.3 fL (ref 80.0–100.0)
MPV: 10.4 fL (ref 7.5–12.5)
Monocytes Relative: 4.7 %
Neutro Abs: 4087 {cells}/uL (ref 1500–7800)
Neutrophils Relative %: 74.3 %
Platelets: 281 10*3/uL (ref 140–400)
RBC: 3.8 10*6/uL (ref 3.80–5.10)
RDW: 15.2 % — ABNORMAL HIGH (ref 11.0–15.0)
Total Lymphocyte: 18.8 %
WBC: 5.5 10*3/uL (ref 3.8–10.8)

## 2023-02-24 LAB — IRON, TOTAL/TOTAL IRON BINDING CAP
%SAT: 17 % (ref 16–45)
Iron: 47 ug/dL (ref 45–160)
TIBC: 283 ug/dL (ref 250–450)

## 2023-02-24 LAB — VITAMIN B12: Vitamin B-12: 433 pg/mL (ref 200–1100)

## 2023-02-24 LAB — TSH: TSH: 2.58 m[IU]/L (ref 0.40–4.50)

## 2023-02-25 ENCOUNTER — Other Ambulatory Visit: Payer: Self-pay | Admitting: *Deleted

## 2023-02-25 DIAGNOSIS — R7989 Other specified abnormal findings of blood chemistry: Secondary | ICD-10-CM

## 2023-02-26 DIAGNOSIS — R7989 Other specified abnormal findings of blood chemistry: Secondary | ICD-10-CM | POA: Diagnosis not present

## 2023-02-27 LAB — FERRITIN: Ferritin: 221 ng/mL (ref 16–288)

## 2023-03-03 DIAGNOSIS — E038 Other specified hypothyroidism: Secondary | ICD-10-CM | POA: Diagnosis not present

## 2023-03-03 DIAGNOSIS — E063 Autoimmune thyroiditis: Secondary | ICD-10-CM | POA: Diagnosis not present

## 2023-03-04 ENCOUNTER — Other Ambulatory Visit: Payer: Self-pay | Admitting: Gastroenterology

## 2023-03-04 ENCOUNTER — Encounter: Payer: Medicare PPO | Admitting: Family Medicine

## 2023-03-04 DIAGNOSIS — D649 Anemia, unspecified: Secondary | ICD-10-CM

## 2023-03-04 LAB — T4, FREE: Free T4: 0.96 ng/dL (ref 0.82–1.77)

## 2023-03-04 LAB — TSH: TSH: 2.64 u[IU]/mL (ref 0.450–4.500)

## 2023-03-09 ENCOUNTER — Other Ambulatory Visit: Payer: Self-pay | Admitting: Family Medicine

## 2023-03-10 NOTE — Patient Instructions (Signed)

## 2023-03-12 ENCOUNTER — Ambulatory Visit: Payer: Medicare PPO | Admitting: Nurse Practitioner

## 2023-03-12 ENCOUNTER — Encounter: Payer: Self-pay | Admitting: Nurse Practitioner

## 2023-03-12 VITALS — BP 135/79 | HR 80 | Ht 63.0 in | Wt 146.4 lb

## 2023-03-12 DIAGNOSIS — E063 Autoimmune thyroiditis: Secondary | ICD-10-CM | POA: Diagnosis not present

## 2023-03-12 DIAGNOSIS — E042 Nontoxic multinodular goiter: Secondary | ICD-10-CM

## 2023-03-12 MED ORDER — LEVOTHYROXINE SODIUM 112 MCG PO TABS: 112.0000 ug | ORAL_TABLET | Freq: Every day | ORAL | 1 refills | Status: DC

## 2023-03-12 NOTE — Progress Notes (Signed)
Endocrinology Follow Up Note                                         03/12/2023, 10:30 AM  Subjective:   Subjective    Faith Hood is a 68 y.o.-year-old female patient being seen in follow up after being seen in consultation for hypothyroidism referred by Kerri Perches, MD.   Past Medical History:  Diagnosis Date   Cerebral vascular disease    Carotid ultrasound in 10/2011: Mild plaque; tortuous vessels; no definite luminal obstruction.   Elevated LFTs    with positive ASMA and mildly elevated IgG, no biopsy pursued as LFTs normalized.   Hepatic steatosis    By CT scan   Hyperlipidemia    Hypertension    Hypothyroidism due to Hashimoto's thyroiditis 2021   Neuropathy    Overweight(278.02)    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HCC) 2021   Uterine leiomyoma    By CT scan    Past Surgical History:  Procedure Laterality Date   COLONOSCOPY  12/2007   Negative screening study   COLONOSCOPY N/A 08/17/2018   Surgeon: Corbin Ade, MD; normal exam.  Repeat in 2030.   DIAGNOSTIC LAPAROSCOPY  1978   Gynecologic problems    Social History   Socioeconomic History   Marital status: Single    Spouse name: Not on file   Number of children: 0   Years of education: Not on file   Highest education level: Not on file  Occupational History   Occupation: Middle school teacher retired    Comment: X25 years  Tobacco Use   Smoking status: Never   Smokeless tobacco: Never  Vaping Use   Vaping status: Never Used  Substance and Sexual Activity   Alcohol use: Not Currently    Comment: occasionally. About twice a year.    Drug use: No   Sexual activity: Not Currently  Other Topics Concern   Not on file  Social History Narrative   Lives with sister occasionally   Right Handed   Social Drivers of Health   Financial Resource Strain: Low Risk  (09/16/2022)   Overall Financial Resource Strain (CARDIA)    Difficulty of  Paying Living Expenses: Not very hard  Food Insecurity: No Food Insecurity (09/16/2022)   Hunger Vital Sign    Worried About Running Out of Food in the Last Year: Never true    Ran Out of Food in the Last Year: Never true  Transportation Needs: No Transportation Needs (09/16/2022)   PRAPARE - Administrator, Civil Service (Medical): No    Lack of Transportation (Non-Medical): No  Physical Activity: Insufficiently Active (09/16/2022)   Exercise Vital Sign    Days of Exercise per Week: 3 days    Minutes of Exercise per Session: 20 min  Stress: Not on file  Social Connections: Not on file    Family History  Problem Relation Age of Onset   Pancreatic cancer Mother 86       Diagnosed  in 2009; currently in hospice pt. htn   Hypertension Mother        + Sister x4   Cancer Father 8       brain tumor   Hypertension Sister    Hypertension Sister    Hypertension Sister    Stroke Sister 32   Hypertension Sister    Cancer Maternal Grandmother    Cancer Paternal Grandmother    Colon cancer Neg Hx    Breast cancer Neg Hx     Outpatient Encounter Medications as of 03/12/2023  Medication Sig   alendronate (FOSAMAX) 70 MG tablet TAKE 1 TABLET BY MOUTH ONCE A WEEK ON AN EMPTY STOMACH WITH A FULL GLASS OF WATER   amLODipine (NORVASC) 10 MG tablet Take 1 tablet by mouth once daily   aspirin EC 81 MG tablet Take 81 mg by mouth daily. Swallow whole.   losartan-hydrochlorothiazide (HYZAAR) 100-25 MG tablet Take 1 tablet by mouth once daily   rosuvastatin (CRESTOR) 5 MG tablet Take 1 tablet (5 mg total) by mouth 3 (three) times a week.   [DISCONTINUED] levothyroxine (SYNTHROID) 112 MCG tablet Take 1 tablet (112 mcg total) by mouth daily before breakfast.   levothyroxine (SYNTHROID) 112 MCG tablet Take 1 tablet (112 mcg total) by mouth daily before breakfast.   No facility-administered encounter medications on file as of 03/12/2023.    ALLERGIES: Allergies  Allergen Reactions    Atorvastatin Other (See Comments)    Muscle pain   VACCINATION STATUS: Immunization History  Administered Date(s) Administered   Influenza Split 12/29/2010, 11/10/2011   Influenza Whole 01/24/2009, 12/17/2009   Influenza,inj,Quad PF,6+ Mos 12/14/2012, 01/31/2014, 01/09/2015, 02/04/2016, 11/23/2016, 12/06/2017, 12/12/2018, 12/26/2019, 10/14/2021   Influenza-Unspecified 01/09/2021   Janssen (J&J) SARS-COV-2 Vaccination 11/28/2019   Moderna Sars-Covid-2 Vaccination 11/02/2019, 11/30/2019, 04/28/2020, 02/19/2021   PNEUMOCOCCAL CONJUGATE-20 10/21/2022   PPD Test 12/14/2012, 12/19/2012   Td 12/17/2009   Tdap 05/08/2020   Zoster Recombinant(Shingrix) 02/03/2018, 06/27/2018     HPI   Faith Hood is a patient with the above medical history. she was diagnosed with hypothyroidism at approximate age of 71 years (newly diagnosed in March 2022), which required subsequent initiation of thyroid hormone supplementation. she was given various doses of Levothyroxine since, currently on 88 micrograms. she reports compliance to this medication:  Taking it daily on empty stomach  with water, separated by >30 minutes before breakfast and other medications , and by at least 4 hours from calcium, iron, PPIs, multivitamins.  I reviewed patient's thyroid tests:  Lab Results  Component Value Date   TSH 2.640 03/03/2023   TSH 2.58 02/18/2023   TSH 2.650 11/05/2022   TSH 2.770 07/31/2022   TSH 4.630 (H) 04/07/2022   TSH 7.280 (H) 09/15/2021   TSH 1.640 05/12/2021   TSH 5.260 (H) 03/13/2021   TSH 2.929 12/04/2020   TSH 6.100 (H) 11/13/2020   FREET4 0.96 03/03/2023   FREET4 1.15 11/05/2022   FREET4 1.22 07/31/2022   FREET4 1.06 04/07/2022   FREET4 0.97 09/15/2021   FREET4 1.22 05/12/2021   FREET4 0.86 03/13/2021   FREET4 0.83 11/13/2020   FREET4 0.93 08/27/2020   FREET4 1.04 06/10/2020   She also had thyroid ultrasound in 08/2020 which showed multiple small nodules bilaterally, none of which met  criteria for dedicated follow up or biopsy.  Pt denies feeling nodules in neck, hoarseness, dysphagia/odynophagia, SOB with lying down.  she does family history of thyroid disorders in her sister (unsure of which but knows it was  surgically removed).  She does report her sister was unable to tolerate the generic form of the hormone and has done well with branded Synthroid.  No family history of thyroid cancer.  No history of radiation therapy to head or neck.  No recent use of iodine supplements.  Denies use of Biotin containing supplements.  I reviewed her chart and she also has a history of HTN, HLD, transaminitis.   Review of systems  Constitutional: + Minimally fluctuating body weight,  current Body mass index is 25.93 kg/m. , + fatigue-worse for the past several weeks , no subjective hyperthermia, no subjective hypothermia Eyes: no blurry vision, no xerophthalmia ENT: no sore throat, no nodules palpated in throat, no dysphagia/odynophagia, no hoarseness Cardiovascular: no chest pain, no shortness of breath, no palpitations, no leg swelling Respiratory: no cough, no shortness of breath Gastrointestinal: no nausea/vomiting/diarrhea Musculoskeletal: + generalized muscle/joint aches Skin: no rashes, no hyperemia Neurological: no tremors, + numbness/tingling/burning pain to bilateral hands and feet (having nerve conduction studies soon), + dizziness (intermittent for last several weeks) Psychiatric: no depression, no anxiety   Objective:   Objective     BP 135/79 (BP Location: Left Arm, Patient Position: Sitting, Cuff Size: Large)   Pulse 80   Ht 5\' 3"  (1.6 m)   Wt 146 lb 6.4 oz (66.4 kg)   BMI 25.93 kg/m  Wt Readings from Last 3 Encounters:  03/12/23 146 lb 6.4 oz (66.4 kg)  02/18/23 147 lb 9.6 oz (67 kg)  01/27/23 150 lb 12.8 oz (68.4 kg)    BP Readings from Last 3 Encounters:  03/12/23 135/79  02/18/23 124/74  01/27/23 (!) 142/68      Physical Exam-  Limited  Constitutional:  Body mass index is 25.93 kg/m. , not in acute distress, normal state of mind Eyes:  EOMI, no exophthalmos Musculoskeletal: no gross deformities, strength intact in all four extremities, no gross restriction of joint movements Skin:  no rashes, no hyperemia Neurological: no tremor with outstretched hands   CMP ( most recent) CMP     Component Value Date/Time   NA 141 02/18/2023 1354   NA 139 01/27/2023 0932   K 3.7 02/18/2023 1354   CL 105 02/18/2023 1354   CO2 28 02/18/2023 1354   GLUCOSE 84 02/18/2023 1354   BUN 16 02/18/2023 1354   BUN 11 01/27/2023 0932   CREATININE 0.82 02/18/2023 1354   CALCIUM 9.7 02/18/2023 1354   PROT 6.9 02/18/2023 1354   PROT 7.2 01/27/2023 0932   ALBUMIN 4.1 01/27/2023 0932   AST 14 02/18/2023 1354   ALT 9 02/18/2023 1354   ALKPHOS 92 01/27/2023 0932   BILITOT 0.4 02/18/2023 1354   BILITOT 0.4 01/27/2023 0932   GFRNONAA >60 12/04/2020 1428   GFRNONAA 80 11/28/2019 1449   GFRAA 93 11/28/2019 1449     Diabetic Labs (most recent): Lab Results  Component Value Date   HGBA1C 5.5 06/23/2018   HGBA1C 5.4 05/15/2016   HGBA1C 5.4 02/04/2016     Lipid Panel ( most recent) Lipid Panel     Component Value Date/Time   CHOL 166 01/27/2023 0932   TRIG 79 01/27/2023 0932   HDL 36 (L) 01/27/2023 0932   CHOLHDL 4.6 (H) 01/27/2023 0932   CHOLHDL 3.5 12/04/2020 1427   VLDL 13 12/04/2020 1427   LDLCALC 115 (H) 01/27/2023 0932   LDLCALC 143 (H) 09/14/2019 1340   LABVLDL 15 01/27/2023 0932       Lab Results  Component Value Date  TSH 2.640 03/03/2023   TSH 2.58 02/18/2023   TSH 2.650 11/05/2022   TSH 2.770 07/31/2022   TSH 4.630 (H) 04/07/2022   TSH 7.280 (H) 09/15/2021   TSH 1.640 05/12/2021   TSH 5.260 (H) 03/13/2021   TSH 2.929 12/04/2020   TSH 6.100 (H) 11/13/2020   FREET4 0.96 03/03/2023   FREET4 1.15 11/05/2022   FREET4 1.22 07/31/2022   FREET4 1.06 04/07/2022   FREET4 0.97 09/15/2021   FREET4 1.22  05/12/2021   FREET4 0.86 03/13/2021   FREET4 0.83 11/13/2020   FREET4 0.93 08/27/2020   FREET4 1.04 06/10/2020    Thyroid US from 09/06/20 CLINICAL DATA:  Hypothyroidism   EXAM: THYROID ULTRASOUND   TECHNIQUE: Ultrasound examination of the thyroid gland and adjacent soft tissues was performed.   COMPARISON:  07/19/2018 and previous back to 04/09/2014   FINDINGS: Parenchymal Echotexture: Moderately heterogenous   Isthmus: 1.4 cm thickness, previously 1.2   Right lobe: 6.1 x 1.2 x 2.3 cm, previously 4.7 x 2.4 x 2.7   Left lobe: 5.9 x 3 x 2.6 cm, previously 5 x 2.5 x 2.3   _________________________________________________________   Estimated total number of nodules >/= 1 cm: 3   Number of spongiform nodules >/=  2 cm not described below (TR1): 0   Number of mixed cystic and solid nodules >/= 1.5 cm not described below (TR2): 0   _________________________________________________________   Nodule # 1:   Prior biopsy: No   Location: Right; superior   Maximum size: 1.6 cm; Other 2 dimensions: 1.1 x 1.4 cm, previously, 1.4 x 1.2 x 1.4 cm   Composition: solid/almost completely solid (2)   Echogenicity: hyperechoic (1)   Shape: not taller-than-wide (0)   Margins: smooth (0)   Echogenic foci: none (0)   ACR TI-RADS total points: 3.   ACR TI-RADS risk category:  TR3 (3 points).   Significant change in size (>/= 20% in two dimensions and minimal increase of 2 mm): No   Change in features: No   Change in ACR TI-RADS risk category: No   ACR TI-RADS recommendations:   *Given size (>/= 1.5 - 2.4 cm) and appearance, a follow-up ultrasound in 1 year should be considered based on TI-RADS criteria.   _________________________________________________________   Nodule # 2: 0.6 cm calcification, mid left, stable; This nodule does NOT meet TI-RADS criteria for biopsy or dedicated follow-up.   _________________________________________________________   Nodule #  3:   Location: Left; superior   Maximum size: 1.2 cm; Other 2 dimensions: 0.8 x 0.9 cm   Composition: solid/almost completely solid (2)   Echogenicity: hyperechoic (1)   Shape: not taller-than-wide (0)   Margins: smooth (0)   Echogenic foci: none (0)   ACR TI-RADS total points: 3.   ACR TI-RADS risk category: TR3 (3 points).   ACR TI-RADS recommendations:   Given size (<1.4 cm) and appearance, this nodule does NOT meet TI-RADS criteria for biopsy or dedicated follow-up.   _________________________________________________________   Nodule # 4:   Location: Isthmus; right of midline   Maximum size: 1.2 cm; Other 2 dimensions: 1 x 0.8 cm   Composition: solid/almost completely solid (2)   Echogenicity: isoechoic (1)   Shape: not taller-than-wide (0)   Margins: ill-defined (0)   Echogenic foci: none (0)   ACR TI-RADS total points: 3.   ACR TI-RADS risk category: TR3 (3 points).   ACR TI-RADS recommendations:   Given size (<1.4 cm) and appearance, this nodule does NOT meet TI-RADS criteria for  biopsy or dedicated follow-up.   IMPRESSION: 1. Enlarged heterogenous thyroid with nodules as above. None meet criteria for biopsy. 2. Recommend annual/biennial ultrasound follow-up of right nodule as above, until stability x5 years confirmed.   The above is in keeping with the ACR TI-RADS recommendations - J Am Coll Radiol 2017;14:587-595.     Electronically Signed   By: Corlis Leak M.D.   On: 09/07/2020 08:13 --------------------------------------------------------------------------------------------------------- Thyroid US from 06/02/21 CLINICAL DATA:  Goiter.   EXAM: THYROID ULTRASOUND   TECHNIQUE: Ultrasound examination of the thyroid gland and adjacent soft tissues was performed.   COMPARISON:  09/06/2020, 07/19/2018   FINDINGS: Parenchymal Echotexture: Moderately heterogenous   Isthmus: 1.5 cm, previously 1.4 cm   Right lobe: 5.1 x 2.8 x 2.3 cm,  previously 6.1 x 1.2 x 2.3 cm   Left lobe: 4.6 x 2.5 x 2.7 cm, previously 5.9 x 3.0 x 2.6 cm   _________________________________________________________   Estimated total number of nodules >/= 1 cm: 3   Number of spongiform nodules >/=  2 cm not described below (TR1): 0   Number of mixed cystic and solid nodules >/= 1.5 cm not described below (TR2): 0   _________________________________________________________   Nodule # 1:   Prior biopsy: No   Location: Isthmus; Inferior   Maximum size: 1.4 cm; Other 2 dimensions: 1.0 x 0.9 cm, previously, 1.2 x 1.0 x 0.8 cm   Composition: solid/almost completely solid (2)   Echogenicity: hyperechoic (1)   Shape: not taller-than-wide (0)   Margins: ill-defined (0)   Echogenic foci: none (0)   ACR TI-RADS total points: 3.   ACR TI-RADS risk category:  TR3 (3 points).   Significant change in size (>/= 20% in two dimensions and minimal increase of 2 mm): No   Change in features: No   Change in ACR TI-RADS risk category: No   ACR TI-RADS recommendations:   Given size (<1.5 cm) and appearance, this nodule does NOT meet TI-RADS criteria for biopsy or dedicated follow-up.   _________________________________________________________   Nodule # 2 (previously labeled 1):   Prior biopsy: No   Location: Right; Superior   Maximum size: 1.5 cm; Other 2 dimensions: 1.3 x 1.2 cm, previously, 1.6 x 1.1 x 1.4 cm   Composition: solid/almost completely solid (2)   Echogenicity: hyperechoic (1)   Shape: not taller-than-wide (0)   Margins: smooth (0)   Echogenic foci: none (0)   ACR TI-RADS total points: 3.   ACR TI-RADS risk category:  TR3 (3 points).   Significant change in size (>/= 20% in two dimensions and minimal increase of 2 mm): No   Change in features: No   Change in ACR TI-RADS risk category: No   ACR TI-RADS recommendations:   *Given size (>/= 1.5 - 2.4 cm) and appearance, a follow-up ultrasound in 1 year  should be considered based on TI-RADS criteria.   _________________________________________________________   Nodule # 3:   Prior biopsy: No   Location: Left; Superior   Maximum size: 1.0 cm; Other 2 dimensions: 0.9 x 0.8 cm, previously, 1.2 x 0.8 x 0.9 cm   Composition: solid/almost completely solid (2)   Echogenicity: hyperechoic (1)   Shape: not taller-than-wide (0)   Margins: smooth (0)   Echogenic foci: none (0)   ACR TI-RADS total points: 3.   ACR TI-RADS risk category:  TR3 (3 points).   Significant change in size (>/= 20% in two dimensions and minimal increase of 2 mm): No   Change in  features: No   Change in ACR TI-RADS risk category: No   ACR TI-RADS recommendations:   Given size (<1.4 cm) and appearance, this nodule does NOT meet TI-RADS criteria for biopsy or dedicated follow-up.   _________________________________________________________   IMPRESSION: 1. Similar appearing enlarged, heterogenous thyroid gland. 2. Similar appearance of previously visualized right superior solid thyroid nodule (labeled 2, 1.5 cm, previously 1.6 cm) which again meets criteria (TI-RADS category 3) for 1 year ultrasound surveillance. This study marks 3 years stability. 3. The remaining visualized thyroid nodules appear benign and do not warrant additional follow-up.   The above is in keeping with the ACR TI-RADS recommendations - J Am Coll Radiol 2017;14:587-595.   Marliss Coots, MD   [facility]   [facility]     Electronically Signed   By: Marliss Coots M.D.   On: 06/02/2021 14:28   Latest Reference Range & Units 09/15/21 11:25 04/07/22 16:11 07/31/22 09:58 11/05/22 16:08 02/18/23 13:54 03/03/23 11:36  TSH 0.450 - 4.500 uIU/mL 7.280 (H) 4.630 (H) 2.770 2.650 2.58 2.640  T4,Free(Direct) 0.82 - 1.77 ng/dL 5.78 4.69 6.29 5.28  4.13  (H): Data is abnormally high  Assessment & Plan:   ASSESSMENT / PLAN:  1.  Hypothyroidism- r/t Hashimoto's thyroiditis  Patient with relatively new hypothyroidism, on levothyroxine therapy. Her antibody testing confirms suspicion of autoimmune thyroid dysfunction.    Her previsit thyroid function tests are consistent with appropriate hormone replacement, leaning towards needing more but she is already on more than her weight based maximum dose of 107 mcg.  She notes she may have taken a few of her left over 100 mcg pills with her 112 mcg prescription.  She is advised to continue her Levothyroxine 112 mcg po daily before breakfast.  Will recheck TFTs prior to next visit and adjust dose further if needed.  - We discussed about correct intake of levothyroxine, at fasting, with water, separated by at least 30 minutes from breakfast, and separated by more than 4 hours from calcium, iron, multivitamins, acid reflux medications (PPIs). -Patient is made aware of the fact that thyroid hormone replacement is needed for life, dose to be adjusted by periodic monitoring of thyroid function tests.  2. Multinodular thyroid Multiple thyroid nodules recommending follow up with surveillance ultrasound in 1 year (around 05/2023).  Will order to be done prior to next visit.    I spent  25  minutes in the care of the patient today including review of labs from Thyroid Function, CMP, and other relevant labs ; imaging/biopsy records (current and previous including abstractions from other facilities); face-to-face time discussing  her lab results and symptoms, medications doses, her options of short and long term treatment based on the latest standards of care / guidelines;   and documenting the encounter.  Radie P Hurlbutt  participated in the discussions, expressed understanding, and voiced agreement with the above plans.  All questions were answered to her satisfaction. she is encouraged to contact clinic should she have any questions or concerns prior to her return visit.   FOLLOW UP  PLAN:  Return in about 3 months (around 06/10/2023) for Thyroid follow up, Previsit labs, thyroid ultrasound.  Ronny Bacon, [facility] [address] [place], [place] [address] Phone: [phone] Fax: [phone]  03/12/2023, 10:30 AM

## 2023-03-16 ENCOUNTER — Other Ambulatory Visit (HOSPITAL_BASED_OUTPATIENT_CLINIC_OR_DEPARTMENT_OTHER): Payer: Self-pay | Admitting: *Deleted

## 2023-03-16 ENCOUNTER — Ambulatory Visit (HOSPITAL_COMMUNITY)
Admission: RE | Admit: 2023-03-16 | Discharge: 2023-03-16 | Disposition: A | Discharge status: Home / Self Care | Payer: Medicare PPO | Source: Ambulatory Visit | Attending: Nurse Practitioner | Admitting: Nurse Practitioner

## 2023-03-16 ENCOUNTER — Encounter (HOSPITAL_BASED_OUTPATIENT_CLINIC_OR_DEPARTMENT_OTHER): Payer: Self-pay | Admitting: *Deleted

## 2023-03-16 DIAGNOSIS — I10 Essential (primary) hypertension: Secondary | ICD-10-CM

## 2023-03-16 DIAGNOSIS — E7849 Other hyperlipidemia: Secondary | ICD-10-CM

## 2023-03-16 DIAGNOSIS — E039 Hypothyroidism, unspecified: Secondary | ICD-10-CM | POA: Diagnosis not present

## 2023-03-16 DIAGNOSIS — E785 Hyperlipidemia, unspecified: Secondary | ICD-10-CM

## 2023-03-16 DIAGNOSIS — Z5181 Encounter for therapeutic drug level monitoring: Secondary | ICD-10-CM

## 2023-03-16 DIAGNOSIS — E063 Autoimmune thyroiditis: Secondary | ICD-10-CM | POA: Diagnosis not present

## 2023-03-16 DIAGNOSIS — E041 Nontoxic single thyroid nodule: Secondary | ICD-10-CM | POA: Diagnosis not present

## 2023-03-16 DIAGNOSIS — E042 Nontoxic multinodular goiter: Secondary | ICD-10-CM | POA: Insufficient documentation

## 2023-03-18 ENCOUNTER — Ambulatory Visit (INDEPENDENT_AMBULATORY_CARE_PROVIDER_SITE_OTHER): Payer: Medicare PPO | Admitting: Family Medicine

## 2023-03-18 ENCOUNTER — Encounter: Payer: Self-pay | Admitting: Family Medicine

## 2023-03-18 VITALS — BP 130/70 | HR 76 | Resp 16 | Ht 64.0 in | Wt 147.0 lb

## 2023-03-18 DIAGNOSIS — E7849 Other hyperlipidemia: Secondary | ICD-10-CM

## 2023-03-18 DIAGNOSIS — Z23 Encounter for immunization: Secondary | ICD-10-CM | POA: Insufficient documentation

## 2023-03-18 DIAGNOSIS — Z0000 Encounter for general adult medical examination without abnormal findings: Secondary | ICD-10-CM | POA: Insufficient documentation

## 2023-03-18 DIAGNOSIS — I1A0 Resistant hypertension: Secondary | ICD-10-CM

## 2023-03-18 DIAGNOSIS — Z0001 Encounter for general adult medical examination with abnormal findings: Secondary | ICD-10-CM

## 2023-03-18 MED ORDER — CARVEDILOL 3.125 MG PO TABS: ORAL_TABLET | ORAL | 3 refills | Status: DC

## 2023-03-18 NOTE — Patient Instructions (Addendum)
F/U in  8 to 10 weeks with meds and BP cuff  Fasting lipid panel, cmp and eGFr 3 to 5 days before next appt  Resume carvedilol 3.125 mg take half tab twice daily, I will prescribe so you get the corret dose   It is important that you exercise regularly at least 30 minutes 5 times a week. If you develop chest pain, have severe difficulty breathing, or feel very tired, stop exercising immediately and seek medical attention   Flu vaccine today  Thanks for choosing [facility], we consider it a privelige to serve you.

## 2023-03-18 NOTE — Assessment & Plan Note (Signed)
Welcome  to medicare visit as  documented. Counseling done  re healthy lifestyle involving commitment to 150 minutes exercise per week, heart healthy diet, and attaining healthy weight.The importance of adequate sleep also discussed. Regular seat belt use and home safety, is also discussed. Changes in health habits are decided on by the patient with goals and time frames  set for achieving them. Needs to start regular exercise Needs blood pressure controlled Will think about advance directives , but not very interested at this time Immunization and cancer screening needs are specifically addressed at this visit.

## 2023-03-18 NOTE — Progress Notes (Signed)
Subjective:    Faith Hood is a 68 y.o. female who presents for a Welcome to Medicare exam.   Cardiac Risk Factors include: advanced age (>45men, >67 women);dyslipidemia;hypertension      Objective:    Today's Vitals   03/18/23 1057 03/18/23 1143  BP: (!) 144/78 130/70  Pulse: 76   Resp: 16   SpO2: 96%   Weight: 147 lb (66.7 kg)   Height: 5\' 4"  (1.626 m)   Body mass index is 25.23 kg/m.  Medications Outpatient Encounter Medications as of 03/18/2023  Medication Sig   alendronate (FOSAMAX) 70 MG tablet TAKE 1 TABLET BY MOUTH ONCE A WEEK ON AN EMPTY STOMACH WITH A FULL GLASS OF WATER   amLODipine (NORVASC) 10 MG tablet Take 1 tablet by mouth once daily   aspirin EC 81 MG tablet Take 81 mg by mouth daily. Swallow whole.   carvedilol (COREG) 3.125 MG tablet Take half tablet by mouth two times daily   levothyroxine (SYNTHROID) 112 MCG tablet Take 1 tablet (112 mcg total) by mouth daily before breakfast.   losartan-hydrochlorothiazide (HYZAAR) 100-25 MG tablet Take 1 tablet by mouth once daily   rosuvastatin (CRESTOR) 5 MG tablet Take 1 tablet (5 mg total) by mouth 3 (three) times a week.   No facility-administered encounter medications on file as of 03/18/2023.     History: Past Medical History:  Diagnosis Date   Cerebral vascular disease    Carotid ultrasound in 10/2011: Mild plaque; tortuous vessels; no definite luminal obstruction.   Elevated LFTs    with positive ASMA and mildly elevated IgG, no biopsy pursued as LFTs normalized.   Hepatic steatosis    By CT scan   Hyperlipidemia    Hypertension    Hypothyroidism due to Hashimoto's thyroiditis 2021   Neuropathy    Overweight(278.02)    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HCC) 2021   Uterine leiomyoma    By CT scan   Past Surgical History:  Procedure Laterality Date   COLONOSCOPY  12/2007   Negative screening study   COLONOSCOPY N/A 08/17/2018   Surgeon: Corbin Ade, MD; normal exam.  Repeat in 2030.   DIAGNOSTIC  LAPAROSCOPY  1978   Gynecologic problems    Family History  Problem Relation Age of Onset   Pancreatic cancer Mother 75       Diagnosed in 2009; currently in hospice pt. 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 About twice a year.    Drug use: No   Sexual activity: Not Currently    Tobacco Counseling Never smoked or use nicotine products  Immunizations and Health Maintenance Immunization History  Administered Date(s) Administered   Fluad Trivalent(High Dose 65+) 03/18/2023   Influenza Split 12/29/2010, 11/10/2011  Influenza Whole 01/24/2009, 12/17/2009   Influenza,inj,Quad PF,6+ Mos 12/14/2012, 01/31/2014, 01/09/2015, 02/04/2016, 11/23/2016, 12/06/2017, 12/12/2018, 12/26/2019, 10/14/2021   Influenza-Unspecified 01/09/2021   Janssen (J&J) SARS-COV-2 Vaccination 11/28/2019   Moderna Sars-Covid-2 Vaccination 11/02/2019, 11/30/2019, 04/28/2020, 02/19/2021   PNEUMOCOCCAL CONJUGATE-20 10/21/2022   PPD Test 12/14/2012, 12/19/2012   Td 12/17/2009   Tdap 05/08/2020   Zoster Recombinant(Shingrix) 02/03/2018, 06/27/2018   There are no preventive care reminders to display for this patient.   Activities of Daily Living    03/18/2023   11:03 AM  In your present state of health, do you have any difficulty performing the following activities:  Hearing? 0  Vision? 0  Difficulty concentrating or making decisions? 0   Walking or climbing stairs? 0  Dressing or bathing? 0  Doing errands, shopping? 0  Preparing Food and eating ? N  Using the Toilet? N  In the past six months, have you accidently leaked urine? N  Do you have problems with loss of bowel control? N  Managing your Medications? N  Managing your Finances? N  Housekeeping or managing your Housekeeping? N    Physical Exam   Advanced Directives: Does Patient Have a Medical Advance Directive?: No Would patient like information on creating a medical advance directive?: Yes (ED - Information included in AVS)I discussed ad WITH PT ,NO LITERATURE WAS PROVIDED WILL DO AT LATER APPOINTMENT, VERY FOCUSED  ON GENERALLY NOT FEELING WELL AND WANTING TO FEEL BETTER. RECEPTOIVE TO IONFO I PROVIDED VERBALLY  EKG:  Most recent was in 08/2022 by cardiology , no new / concerning symptoms and is foillowed by Cardiology alo, no EKG done at visit     Assessment:    This is a routine wellness examination for this patient . Faith Hood  Vision/Hearing screen Both uncorrected vision and hearing are within normal range   Goals      Increase physical activity     Wants to improve her health      Prevent falls        Depression Screen    03/18/2023   11:01 AM 10/21/2022    9:44 AM 04/01/2022    1:26 PM 01/01/2022    2:20 PM  PHQ 2/9 Scores  PHQ - 2 Score 0 0 0 0     Fall Risk    03/18/2023   11:03 AM  Fall Risk   Falls in the past year? 0  Number falls in past yr: 0  Injury with Fall? 0    Cognitive Function:        03/18/2023   11:05 AM  6CIT Screen  What Year? 0 points  What month? 0 points  What time? 0 points  Count back from 20 0 points  Months in reverse 4 points  Repeat phrase 4 points  Total Score 8 points    Patient Care Team: Kerri Perches, MD as PCP - General Little Ishikawa, MD as PCP - Cardiology (Cardiology) Jena Gauss Gerrit Friends, MD as Consulting Physician (Gastroenterology) Dr Jetta Lout Dr Alben Deeds      Plan:   Welcome to Medicare preventive visit Welcome  to medicare visit as  documented. Counseling done  re healthy lifestyle involving commitment to 150 minutes exercise per week, heart healthy diet, and attaining healthy weight.The importance of adequate sleep also discussed. Regular seat belt use and home safety, is also discussed. Changes in health habits are decided on by the patient with goals and time frames  set for achieving them. Needs  to start regular exercise Needs blood pressure controlled Will think about advance directives , but not very interested at this time Immunization and cancer screening needs are specifically addressed at this visit.   Encounter for immunization After obtaining informed consent, the influenza vaccine is  administered , with no adverse effect noted at the time of administration.    I have personally reviewed and noted the following in the patient's chart:   Medical and social history Use of alcohol, tobacco or illicit drugs  Current medications and supplements Functional ability and status Nutritional status Physical activity Advanced directives List of other physicians Hospitalizations, surgeries, and ER visits in previous 12 months Vitals Screenings to include cognitive, depression, and falls Referrals and appointments  In addition, I have reviewed and discussed with patient certain preventive protocols, quality metrics, and best practice recommendations. A written personalized care plan for preventive services as well as general preventive health recommendations were provided to patient.     Syliva Overman, MD 03/18/2023

## 2023-03-18 NOTE — Assessment & Plan Note (Signed)
After obtaining informed consent, the influenza vaccine is  administered , with no adverse effect noted at the time of administration.

## 2023-03-24 ENCOUNTER — Encounter (HOSPITAL_COMMUNITY): Payer: Self-pay

## 2023-03-24 ENCOUNTER — Ambulatory Visit (HOSPITAL_COMMUNITY)
Admission: RE | Admit: 2023-03-24 | Discharge: 2023-03-24 | Disposition: A | Discharge status: Home / Self Care | Payer: Medicare PPO | Source: Ambulatory Visit | Attending: Family Medicine | Admitting: Family Medicine

## 2023-03-24 DIAGNOSIS — Z1231 Encounter for screening mammogram for malignant neoplasm of breast: Secondary | ICD-10-CM | POA: Diagnosis not present

## 2023-03-24 DIAGNOSIS — Z780 Asymptomatic menopausal state: Secondary | ICD-10-CM | POA: Insufficient documentation

## 2023-03-24 DIAGNOSIS — M810 Age-related osteoporosis without current pathological fracture: Secondary | ICD-10-CM | POA: Diagnosis not present

## 2023-03-27 ENCOUNTER — Other Ambulatory Visit (INDEPENDENT_AMBULATORY_CARE_PROVIDER_SITE_OTHER): Payer: Self-pay

## 2023-03-30 DIAGNOSIS — M300 Polyarteritis nodosa: Secondary | ICD-10-CM | POA: Diagnosis not present

## 2023-03-30 DIAGNOSIS — Z7952 Long term (current) use of systemic steroids: Secondary | ICD-10-CM | POA: Diagnosis not present

## 2023-03-30 DIAGNOSIS — Z6824 Body mass index (BMI) 24.0-24.9, adult: Secondary | ICD-10-CM | POA: Diagnosis not present

## 2023-03-30 DIAGNOSIS — R200 Anesthesia of skin: Secondary | ICD-10-CM | POA: Diagnosis not present

## 2023-03-30 DIAGNOSIS — M810 Age-related osteoporosis without current pathological fracture: Secondary | ICD-10-CM | POA: Diagnosis not present

## 2023-04-01 DIAGNOSIS — D649 Anemia, unspecified: Secondary | ICD-10-CM | POA: Diagnosis not present

## 2023-04-02 LAB — FOLATE: Folate: >24 ng/mL

## 2023-04-08 ENCOUNTER — Encounter: Payer: Self-pay | Admitting: Family Medicine

## 2023-04-08 ENCOUNTER — Other Ambulatory Visit: Payer: Self-pay | Admitting: Family Medicine

## 2023-04-08 MED ORDER — ALENDRONATE SODIUM 70 MG PO TABS: 70.0000 mg | ORAL_TABLET | ORAL | 11 refills | Status: AC

## 2023-04-09 ENCOUNTER — Other Ambulatory Visit: Payer: Self-pay

## 2023-04-09 MED ORDER — ALENDRONATE SODIUM 70 MG PO TABS: ORAL_TABLET | ORAL | 3 refills | Status: DC

## 2023-04-12 ENCOUNTER — Other Ambulatory Visit: Payer: Self-pay | Admitting: Family Medicine

## 2023-04-24 ENCOUNTER — Other Ambulatory Visit (INDEPENDENT_AMBULATORY_CARE_PROVIDER_SITE_OTHER): Payer: Self-pay

## 2023-05-04 ENCOUNTER — Telehealth: Payer: Self-pay

## 2023-05-04 DIAGNOSIS — Z006 Encounter for examination for normal comparison and control in clinical research program: Secondary | ICD-10-CM

## 2023-05-04 NOTE — Telephone Encounter (Signed)
 I attempted to call pt today for a follow up visit/ close out visit. Pt is in Dr. Leonides Sake Virtual Care HTN Study enrolled in Group [employer]. Pt does not have a voicemail set up. I will attempt to call and follow up on Friday.

## 2023-05-06 NOTE — Research (Signed)
 Pt is enrolled in Dr. Leonides Sake HTN Virtual Trial in Group [employer]. Pt was marked complete on 04-May-2023. Pt's Cantril's Ladder and follow up survey was done over the phone. Pt's survey is charted in [employer].    Cantril's Ladder  Please imagine a ladder with steps numbered from zero at the bottom to ten at the top. The top of the ladder represents the best possible life for you and the bottom of the ladder represents the worst possible life for you.    Indicate on the ladder where you feel you personally stand right now: 7   Indicate on the ladder where you feel you will stand in 5 years: 10

## 2023-05-17 DIAGNOSIS — I1A0 Resistant hypertension: Secondary | ICD-10-CM | POA: Diagnosis not present

## 2023-05-17 DIAGNOSIS — E7849 Other hyperlipidemia: Secondary | ICD-10-CM | POA: Diagnosis not present

## 2023-05-18 ENCOUNTER — Ambulatory Visit: Payer: Medicare PPO | Admitting: Family Medicine

## 2023-05-18 ENCOUNTER — Encounter: Payer: Self-pay | Admitting: Family Medicine

## 2023-05-18 VITALS — BP 160/80 | HR 52 | Ht 64.0 in | Wt 147.0 lb

## 2023-05-18 DIAGNOSIS — R21 Rash and other nonspecific skin eruption: Secondary | ICD-10-CM | POA: Diagnosis not present

## 2023-05-18 DIAGNOSIS — R202 Paresthesia of skin: Secondary | ICD-10-CM | POA: Insufficient documentation

## 2023-05-18 DIAGNOSIS — E7849 Other hyperlipidemia: Secondary | ICD-10-CM | POA: Diagnosis not present

## 2023-05-18 DIAGNOSIS — E063 Autoimmune thyroiditis: Secondary | ICD-10-CM

## 2023-05-18 DIAGNOSIS — M300 Polyarteritis nodosa: Secondary | ICD-10-CM | POA: Diagnosis not present

## 2023-05-18 DIAGNOSIS — I1A0 Resistant hypertension: Secondary | ICD-10-CM | POA: Diagnosis not present

## 2023-05-18 DIAGNOSIS — M810 Age-related osteoporosis without current pathological fracture: Secondary | ICD-10-CM | POA: Diagnosis not present

## 2023-05-18 DIAGNOSIS — R200 Anesthesia of skin: Secondary | ICD-10-CM | POA: Diagnosis not present

## 2023-05-18 LAB — CMP14+EGFR
ALT: 10 IU/L (ref 0–32)
AST: 16 IU/L (ref 0–40)
Albumin: 4.1 g/dL (ref 3.9–4.9)
Alkaline Phosphatase: 80 IU/L (ref 44–121)
BUN/Creatinine Ratio: 16 (ref 12–28)
BUN: 14 mg/dL (ref 8–27)
Bilirubin Total: 0.3 mg/dL (ref 0.0–1.2)
CO2: 22 mmol/L (ref 20–29)
Calcium: 9.3 mg/dL (ref 8.7–10.3)
Chloride: 104 mmol/L (ref 96–106)
Creatinine, Ser: 0.86 mg/dL (ref 0.57–1.00)
Globulin, Total: 2.8 g/dL (ref 1.5–4.5)
Glucose: 87 mg/dL (ref 70–99)
Potassium: 3.6 mmol/L (ref 3.5–5.2)
Sodium: 140 mmol/L (ref 134–144)
Total Protein: 6.9 g/dL (ref 6.0–8.5)
eGFR: 75 mL/min/{1.73_m2} (ref >59)

## 2023-05-18 LAB — LIPID PANEL
Chol/HDL Ratio: 3.4 ratio (ref 0.0–4.4)
Cholesterol, Total: 126 mg/dL (ref 100–199)
HDL: 37 mg/dL — ABNORMAL LOW (ref >39)
LDL Chol Calc (NIH): 72 mg/dL (ref 0–99)
Triglycerides: 87 mg/dL (ref 0–149)
VLDL Cholesterol Cal: 17 mg/dL (ref 5–40)

## 2023-05-18 MED ORDER — LEVOTHYROXINE SODIUM 112 MCG PO TABS: 112.0000 ug | ORAL_TABLET | Freq: Every day | ORAL | 1 refills | Status: DC

## 2023-05-18 MED ORDER — LOSARTAN POTASSIUM-HCTZ 100-25 MG PO TABS: 1.0000 | ORAL_TABLET | Freq: Every day | ORAL | 0 refills | Status: DC

## 2023-05-18 MED ORDER — AMLODIPINE BESYLATE 10 MG PO TABS: 10.0000 mg | ORAL_TABLET | Freq: Every day | ORAL | 0 refills | Status: DC

## 2023-05-18 NOTE — Assessment & Plan Note (Signed)
 Reports improvement in 2025, March reports only intermittent tingling of the toes

## 2023-05-18 NOTE — Patient Instructions (Addendum)
 F/U in 6 to 8 weeks with blood pressure medications and your blood pressure cuff  Carvedilol dose and compliance are questionable, blood pressure is high  Please send a message about that particular med when you go home, dose, when last filled and how you take the medication, I will adjust medication based on that, I have sent you a messAGE SO YOU CAN DIRECTLY MESSage me     Labs are excellent   You will be referred to Dermatology re rash   Second opinion re dental work needed is recommended  Tingling in toes can be monitored since improving and triggered by sweets  Thanks for choosing [facility], we consider it a privelige to serve you.

## 2023-05-18 NOTE — Assessment & Plan Note (Signed)
 Uncontrolled, mneed to verify meds she is taking DASH diet and commitment to daily physical activity for a minimum of 30 minutes discussed and encouraged, as a part of hypertension management. The importance of attaining a healthy weight is also discussed.     05/18/2023   11:07 AM 05/18/2023   10:23 AM 05/18/2023   10:10 AM 03/18/2023   11:43 AM 03/18/2023   10:57 AM 03/12/2023    9:43 AM 02/18/2023   11:26 AM  BP/Weight  Systolic BP 160 150 156 130 144 135 124  Diastolic BP 80 71 68 70 78 79 74  Wt. (Lbs)   147  147 146.4 147.6  BMI   25.23 kg/m2  25.23 kg/m2 25.93 kg/m2 26.15 kg/m2

## 2023-05-18 NOTE — Assessment & Plan Note (Signed)
 Hyperlipidemia:Low fat diet discussed and encouraged.   Lipid Panel  Lab Results  Component Value Date   CHOL 126 05/17/2023   HDL 37 (L) 05/17/2023   LDLCALC 72 05/17/2023   TRIG 87 05/17/2023   CHOLHDL 3.4 05/17/2023     Controlled, no change in medication

## 2023-05-18 NOTE — Assessment & Plan Note (Signed)
Managed by endo and controlled 

## 2023-05-18 NOTE — Assessment & Plan Note (Signed)
 Continue weekly fosamax witnh calcium and d, has started exercising 5 days/ week which is good

## 2023-05-18 NOTE — Assessment & Plan Note (Signed)
 Biopsy proven poyerteritis nodosa, however pt re[ports rash spreading in both lower extremities , rheumatology has stopped prednisone , no specific recommendation, will refer derm

## 2023-05-18 NOTE — Progress Notes (Signed)
 Faith Hood     MRN: [id number]      DOB: 10/17/1955  Chief Complaint  Patient presents with   Follow-up    8-10 wk   Would like advice on what to do for teeth.  Sensitive Skin irritation from the waist down, causes itching.  Pain and numbness in feet .     HPI Faith Hood is here for follow up and re-evaluation of chronic medical conditions, medication management and review of any available recent lab and radiology data.  Preventive health is updated, specifically  Cancer screening and Immunization.   Questions or concerns regarding consultations or procedures which the PT has had in the interim are  addressed. The PT denies any adverse reactions to current medications since the last visit.  C/o tingling in toes aggravated when she eats sugar Unclear as to whether she is taking  carvedilol as prescribed and blood pressure uncontrolled will ned to message or call back for clarification Recently advised that due to gum disease all teeth need exraction wants and justifiably so, a 2nd opinion on this Hyperpigmented rash in lower extremities which is spreading upwards and INVOLVING AND INCREASING AREA OF SKIN ROS Denies recent fever or chills. Denies sinus pressure, nasal congestion, ear pain or sore throat. Denies chest congestion, productive cough or wheezing. Denies chest pains, palpitations and leg swelling Denies abdominal pain, nausea, vomiting,diarrhea or constipation.   Denies dysuria, frequency, hesitancy or incontinence. Denies joint pain, swelling and limitation in mobility. Denies headaches, seizures, numbness, or tingling. Denies depression, anxiety or insomnia.  PE  BP (!) 160/80   Pulse (!) 52   Ht 5\' 4"  (1.626 m)   Wt 147 lb (66.7 kg)   SpO2 95%   BMI 25.23 kg/m   Patient alert and oriented and in no cardiopulmonary distress.  HEENT: No facial asymmetry, EOMI,     Neck supple .  Chest: Clear to auscultation bilaterally.  CVS: S1, S2 no murmurs, no S3.Regular  rate.  ABD: Soft non tender.   Ext: No edema  MS: Adequate ROM spine, shoulders, hips and knees.  Skin: Intact, hyperpigmented macular rash on lower extremities rash noted.  Psych: Good eye contact, normal affect. Memory intact not anxious or depressed appearing.  CNS: CN 2-12 intact, power,  normal throughout.no focal deficits noted.   Assessment & Plan  Tingling of both feet Tingling in toes , all 10 for over 1 year, triggr seems to be sweets, not aggravatedby weightbearing , no weakness  Resistant hypertension   Uncontrolled, mneed to verify meds she is taking DASH diet and commitment to daily physical activity for a minimum of 30 minutes discussed and encouraged, as a part of hypertension management. The importance of attaining a healthy weight is also discussed.     05/18/2023   11:07 AM 05/18/2023   10:23 AM 05/18/2023   10:10 AM 03/18/2023   11:43 AM 03/18/2023   10:57 AM 03/12/2023    9:43 AM 02/18/2023   11:26 AM  BP/Weight  Systolic BP 160 150 156 130 144 135 124  Diastolic BP 80 71 68 70 78 79 74  Wt. (Lbs)   147  147 146.4 147.6  BMI   25.23 kg/m2  25.23 kg/m2 25.93 kg/m2 26.15 kg/m2       Hypothyroidism Managed by endo and controlled  Numbness of feet Reports improvement in 2025, March reports only intermittent tingling of the toes  Rash and other nonspecific skin eruption Biopsy proven poyerteritis nodosa, however  pt re[ports rash spreading in both lower extremities , rheumatology has stopped prednisone , no specific recommendation, will refer derm  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HCC) Currently on n o medication followed by rheumatology, was on prednisone  Hyperlipidemia Hyperlipidemia:Low fat diet discussed and encouraged.   Lipid Panel  Lab Results  Component Value Date   CHOL 126 05/17/2023   HDL 37 (L) 05/17/2023   LDLCALC 72 05/17/2023   TRIG 87 05/17/2023   CHOLHDL 3.4 05/17/2023     Controlled, no change in  medication   Osteoporosis Continue weekly fosamax witnh calcium and d, has started exercising 5 days/ week which is good

## 2023-05-18 NOTE — Assessment & Plan Note (Signed)
 Tingling in toes , all 10 for over 1 year, triggr seems to be sweets, not aggravatedby weightbearing , no weakness

## 2023-05-18 NOTE — Assessment & Plan Note (Signed)
 Currently on n o medication followed by rheumatology, was on prednisone

## 2023-05-24 MED ORDER — CARVEDILOL 3.125 MG PO TABS: ORAL_TABLET | ORAL | 3 refills | Status: DC

## 2023-05-24 NOTE — Addendum Note (Signed)
 Addended by: Kerri Perches on: 05/24/2023 08:40 AM   Modules accepted: Orders

## 2023-06-15 DIAGNOSIS — E042 Nontoxic multinodular goiter: Secondary | ICD-10-CM | POA: Diagnosis not present

## 2023-06-15 DIAGNOSIS — E063 Autoimmune thyroiditis: Secondary | ICD-10-CM | POA: Diagnosis not present

## 2023-06-16 LAB — T4, FREE: Free T4: 1.44 ng/dL (ref 0.82–1.77)

## 2023-06-16 LAB — TSH: TSH: 0.523 u[IU]/mL (ref 0.450–4.500)

## 2023-06-17 ENCOUNTER — Ambulatory Visit: Payer: Medicare PPO | Admitting: Nurse Practitioner

## 2023-06-17 ENCOUNTER — Encounter: Payer: Self-pay | Admitting: Nurse Practitioner

## 2023-06-17 VITALS — BP 136/82 | HR 52 | Ht 64.0 in | Wt 146.0 lb

## 2023-06-17 DIAGNOSIS — E042 Nontoxic multinodular goiter: Secondary | ICD-10-CM | POA: Diagnosis not present

## 2023-06-17 DIAGNOSIS — I10 Essential (primary) hypertension: Secondary | ICD-10-CM

## 2023-06-17 DIAGNOSIS — E063 Autoimmune thyroiditis: Secondary | ICD-10-CM

## 2023-06-17 MED ORDER — LEVOTHYROXINE SODIUM 112 MCG PO TABS: 112.0000 ug | ORAL_TABLET | Freq: Every day | ORAL | 1 refills | Status: DC

## 2023-06-17 NOTE — Progress Notes (Signed)
 Endocrinology Follow Up Note                                         06/17/2023, 9:56 AM  Subjective:   Subjective    Faith Hood is a 68 y.o.-year-old female patient being seen in follow up after being seen in consultation for hypothyroidism referred by Towanda Fret, MD.   Past Medical History:  Diagnosis Date   Cerebral vascular disease    Carotid ultrasound in 10/2011: Mild plaque; tortuous vessels; no definite luminal obstruction.   Elevated LFTs    with positive ASMA and mildly elevated IgG, no biopsy pursued as LFTs normalized.   Hepatic steatosis    By CT scan   Hyperlipidemia    Hypertension    Hypothyroidism due to Hashimoto's thyroiditis 2021   Neuropathy    Overweight(278.02)    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HCC) 2021   Uterine leiomyoma    By CT scan    Past Surgical History:  Procedure Laterality Date   COLONOSCOPY  12/2007   Negative screening study   COLONOSCOPY N/A 08/17/2018   Surgeon: Suzette Espy, MD; normal exam.  Repeat in 2030.   DIAGNOSTIC LAPAROSCOPY  1978   Gynecologic problems    Social History   Socioeconomic History   Marital status: Single    Spouse name: Not on file   Number of children: 0   Years of education: Not on file   Highest education level: Not on file  Occupational History   Occupation: Middle school teacher retired    Comment: X25 years  Tobacco Use   Smoking status: Never   Smokeless tobacco: Never  Vaping Use   Vaping status: Never Used  Substance and Sexual Activity   Alcohol use: Not Currently    Comment: occasionally. About twice a year.    Drug use: No   Sexual activity: Not Currently  Other Topics Concern   Not on file  Social History Narrative   Lives with sister occasionally   Right Handed   Social Drivers of Health   Financial Resource Strain: Low Risk  (03/18/2023)   Overall Financial Resource Strain (CARDIA)    Difficulty of  Paying Living Expenses: Not hard at all  Food Insecurity: No Food Insecurity (03/18/2023)   Hunger Vital Sign    Worried About Running Out of Food in the Last Year: Never true    Ran Out of Food in the Last Year: Never true  Transportation Needs: No Transportation Needs (03/18/2023)   PRAPARE - Administrator, Civil Service (Medical): No    Lack of Transportation (Non-Medical): No  Physical Activity: Insufficiently Active (03/18/2023)   Exercise Vital Sign    Days of Exercise per Week: 2 days    Minutes of Exercise per Session: 20 min  Stress: Not on file  Social Connections: Moderately Isolated (03/18/2023)   Social Connection and Isolation Panel [NHANES]    Frequency of Communication with Friends and Family: More than three times a  week    Frequency of Social Gatherings with Friends and Family: More than three times a week    Attends Religious Services: More than 4 times per year    Active Member of [employer] or Organizations: No    Attends Engineer, structural: Never    Marital Status: Never married    Family History  Problem Relation Age of Onset   Pancreatic cancer Mother 21       Diagnosed in 2009; currently in hospice pt. htn   Hypertension Mother        + Sister x4   Cancer Father 55       brain tumor   Hypertension Sister    Hypertension Sister    Hypertension Sister    Stroke Sister 32   Hypertension Sister    Cancer Maternal Grandmother    Cancer Paternal Grandmother    Colon cancer Neg Hx    Breast cancer Neg Hx     Outpatient Encounter Medications as of 06/17/2023  Medication Sig   alendronate  (FOSAMAX ) 70 MG tablet Take 1 tablet (70 mg total) by mouth every 7 (seven) days. Take with a full glass of water  on an empty stomach.   alendronate  (FOSAMAX ) 70 MG tablet TAKE 1 TABLET BY MOUTH ONCE A WEEK ON AN EMPTY STOMACH WITH A FULL GLASS OF WATER    amLODipine  (NORVASC ) 10 MG tablet Take 1 tablet (10 mg total) by mouth daily.   aspirin EC 81 MG  tablet Take 81 mg by mouth daily. Swallow whole.   carvedilol  (COREG ) 3.125 MG tablet Take one  tablet by mouth two times daily   levothyroxine  (SYNTHROID ) 112 MCG tablet Take 1 tablet (112 mcg total) by mouth daily before breakfast.   losartan -hydrochlorothiazide  (HYZAAR) 100-25 MG tablet Take 1 tablet by mouth daily.   rosuvastatin  (CRESTOR ) 5 MG tablet Take 1 tablet (5 mg total) by mouth 3 (three) times a week.   [DISCONTINUED] levothyroxine  (SYNTHROID ) 112 MCG tablet Take 1 tablet (112 mcg total) by mouth daily before breakfast.   No facility-administered encounter medications on file as of 06/17/2023.    ALLERGIES: Allergies  Allergen Reactions   Atorvastatin  Other (See Comments)    Muscle pain   VACCINATION STATUS: Immunization History  Administered Date(s) Administered   Fluad Trivalent(High Dose 65+) 03/18/2023   Influenza Split 12/29/2010, 11/10/2011   Influenza Whole 01/24/2009, 12/17/2009   Influenza,inj,Quad PF,6+ Mos 12/14/2012, 01/31/2014, 01/09/2015, 02/04/2016, 11/23/2016, 12/06/2017, 12/12/2018, 12/26/2019, 10/14/2021   Influenza-Unspecified 01/09/2021   Janssen (J&J) SARS-COV-2 Vaccination 11/28/2019   Moderna Sars-Covid-2 Vaccination 11/02/2019, 11/30/2019, 04/28/2020, 02/19/2021   PNEUMOCOCCAL CONJUGATE-20 10/21/2022   PPD Test 12/14/2012, 12/19/2012   Td 12/17/2009   Tdap 05/08/2020   Zoster Recombinant(Shingrix ) 02/03/2018, 06/27/2018     HPI   Faith Hood is a patient with the above medical history. she was diagnosed with hypothyroidism at approximate age of 37 years (newly diagnosed in March 2022), which required subsequent initiation of thyroid  hormone supplementation. she was given various doses of Levothyroxine  since, currently on 88 micrograms. she reports compliance to this medication:  Taking it daily on empty stomach  with water , separated by >30 minutes before breakfast and other medications , and by at least 4 hours from calcium , iron, PPIs,  multivitamins.  I reviewed patient's thyroid  tests:  Lab Results  Component Value Date   TSH 0.523 06/15/2023   TSH 2.640 03/03/2023   TSH 2.58 02/18/2023   TSH 2.650 11/05/2022   TSH 2.770 07/31/2022  TSH 4.630 (H) 04/07/2022   TSH 7.280 (H) 09/15/2021   TSH 1.640 05/12/2021   TSH 5.260 (H) 03/13/2021   TSH 2.929 12/04/2020   FREET4 1.44 06/15/2023   FREET4 0.96 03/03/2023   FREET4 1.15 11/05/2022   FREET4 1.22 07/31/2022   FREET4 1.06 04/07/2022   FREET4 0.97 09/15/2021   FREET4 1.22 05/12/2021   FREET4 0.86 03/13/2021   FREET4 0.83 11/13/2020   FREET4 0.93 08/27/2020   She also had thyroid  ultrasound in 08/2020 which showed multiple small nodules bilaterally, none of which met criteria for dedicated follow up or biopsy.  Pt denies feeling nodules in neck, hoarseness, dysphagia/odynophagia, SOB with lying down.  she does family history of thyroid  disorders in her sister (unsure of which but knows it was surgically removed).  She does report her sister was unable to tolerate the generic form of the hormone and has done well with branded Synthroid .  No family history of thyroid  cancer.  No history of radiation therapy to head or neck.  No recent use of iodine supplements.  Denies use of Biotin containing supplements.  I reviewed her chart and she also has a history of HTN, HLD, transaminitis.   Review of systems  Constitutional: + Minimally fluctuating body weight,  current Body mass index is 25.06 kg/m. , + fatigue-worse for the past several weeks , no subjective hyperthermia, no subjective hypothermia Eyes: no blurry vision, no xerophthalmia ENT: no sore throat, no nodules palpated in throat, no dysphagia/odynophagia, no hoarseness Cardiovascular: no chest pain, no shortness of breath, no palpitations, no leg swelling Respiratory: no cough, no shortness of breath Gastrointestinal: no nausea/vomiting/diarrhea Musculoskeletal: + generalized muscle/joint aches Skin: no  rashes, no hyperemia Neurological: no tremors, + numbness/tingling/burning pain to bilateral hands and feet (having nerve conduction studies soon), + dizziness (intermittent for last several weeks) Psychiatric: no depression, no anxiety   Objective:   Objective     BP 136/82   Pulse (!) 52   Ht 5\' 4"  (1.626 m)   Wt 146 lb (66.2 kg)   BMI 25.06 kg/m  Wt Readings from Last 3 Encounters:  06/17/23 146 lb (66.2 kg)  05/18/23 147 lb (66.7 kg)  03/18/23 147 lb (66.7 kg)    BP Readings from Last 3 Encounters:  06/17/23 136/82  05/18/23 (!) 160/80  03/18/23 130/70      Physical Exam- Limited  Constitutional:  Body mass index is 25.06 kg/m. , not in acute distress, normal state of mind Eyes:  EOMI, no exophthalmos Musculoskeletal: no gross deformities, strength intact in all four extremities, no gross restriction of joint movements Skin:  no rashes, no hyperemia Neurological: no tremor with outstretched hands   CMP ( most recent) CMP     Component Value Date/Time   NA 140 05/17/2023 0910   K 3.6 05/17/2023 0910   CL 104 05/17/2023 0910   CO2 22 05/17/2023 0910   GLUCOSE 87 05/17/2023 0910   GLUCOSE 84 02/18/2023 1354   BUN 14 05/17/2023 0910   CREATININE 0.86 05/17/2023 0910   CREATININE 0.82 02/18/2023 1354   CALCIUM  9.3 05/17/2023 0910   PROT 6.9 05/17/2023 0910   ALBUMIN 4.1 05/17/2023 0910   AST 16 05/17/2023 0910   ALT 10 05/17/2023 0910   ALKPHOS 80 05/17/2023 0910   BILITOT 0.3 05/17/2023 0910   GFRNONAA >60 12/04/2020 1428   GFRNONAA 80 11/28/2019 1449   GFRAA 93 11/28/2019 1449     Diabetic Labs (most recent): Lab Results  Component Value  Date   HGBA1C 5.5 06/23/2018   HGBA1C 5.4 05/15/2016   HGBA1C 5.4 02/04/2016     Lipid Panel ( most recent) Lipid Panel     Component Value Date/Time   CHOL 126 05/17/2023 0910   TRIG 87 05/17/2023 0910   HDL 37 (L) 05/17/2023 0910   CHOLHDL 3.4 05/17/2023 0910   CHOLHDL 3.5 12/04/2020 1427   VLDL 13  12/04/2020 1427   LDLCALC 72 05/17/2023 0910   LDLCALC 143 (H) 09/14/2019 1340   LABVLDL 17 05/17/2023 0910       Lab Results  Component Value Date   TSH 0.523 06/15/2023   TSH 2.640 03/03/2023   TSH 2.58 02/18/2023   TSH 2.650 11/05/2022   TSH 2.770 07/31/2022   TSH 4.630 (H) 04/07/2022   TSH 7.280 (H) 09/15/2021   TSH 1.640 05/12/2021   TSH 5.260 (H) 03/13/2021   TSH 2.929 12/04/2020   FREET4 1.44 06/15/2023   FREET4 0.96 03/03/2023   FREET4 1.15 11/05/2022   FREET4 1.22 07/31/2022   FREET4 1.06 04/07/2022   FREET4 0.97 09/15/2021   FREET4 1.22 05/12/2021   FREET4 0.86 03/13/2021   FREET4 0.83 11/13/2020   FREET4 0.93 08/27/2020    Thyroid  US  from 09/06/20 CLINICAL DATA:  Hypothyroidism   EXAM: THYROID  ULTRASOUND   TECHNIQUE: Ultrasound examination of the thyroid  gland and adjacent soft tissues was performed.   COMPARISON:  07/19/2018 and previous back to 04/09/2014   FINDINGS: Parenchymal Echotexture: Moderately heterogenous   Isthmus: 1.4 cm thickness, previously 1.2   Right lobe: 6.1 x 1.2 x 2.3 cm, previously 4.7 x 2.4 x 2.7   Left lobe: 5.9 x 3 x 2.6 cm, previously 5 x 2.5 x 2.3   _________________________________________________________   Estimated total number of nodules >/= 1 cm: 3   Number of spongiform nodules >/=  2 cm not described below (TR1): 0   Number of mixed cystic and solid nodules >/= 1.5 cm not described below (TR2): 0   _________________________________________________________   Nodule # 1:   Prior biopsy: No   Location: Right; superior   Maximum size: 1.6 cm; Other 2 dimensions: 1.1 x 1.4 cm, previously, 1.4 x 1.2 x 1.4 cm   Composition: solid/almost completely solid (2)   Echogenicity: hyperechoic (1)   Shape: not taller-than-wide (0)   Margins: smooth (0)   Echogenic foci: none (0)   ACR TI-RADS total points: 3.   ACR TI-RADS risk category:  TR3 (3 points).   Significant change in size (>/= 20% in two  dimensions and minimal increase of 2 mm): No   Change in features: No   Change in ACR TI-RADS risk category: No   ACR TI-RADS recommendations:   *Given size (>/= 1.5 - 2.4 cm) and appearance, a follow-up ultrasound in 1 year should be considered based on TI-RADS criteria.   _________________________________________________________   Nodule # 2: 0.6 cm calcification, mid left, stable; This nodule does NOT meet TI-RADS criteria for biopsy or dedicated follow-up.   _________________________________________________________   Nodule # 3:   Location: Left; superior   Maximum size: 1.2 cm; Other 2 dimensions: 0.8 x 0.9 cm   Composition: solid/almost completely solid (2)   Echogenicity: hyperechoic (1)   Shape: not taller-than-wide (0)   Margins: smooth (0)   Echogenic foci: none (0)   ACR TI-RADS total points: 3.   ACR TI-RADS risk category: TR3 (3 points).   ACR TI-RADS recommendations:   Given size (<1.4 cm) and appearance, this nodule does  NOT meet TI-RADS criteria for biopsy or dedicated follow-up.   _________________________________________________________   Nodule # 4:   Location: Isthmus; right of midline   Maximum size: 1.2 cm; Other 2 dimensions: 1 x 0.8 cm   Composition: solid/almost completely solid (2)   Echogenicity: isoechoic (1)   Shape: not taller-than-wide (0)   Margins: ill-defined (0)   Echogenic foci: none (0)   ACR TI-RADS total points: 3.   ACR TI-RADS risk category: TR3 (3 points).   ACR TI-RADS recommendations:   Given size (<1.4 cm) and appearance, this nodule does NOT meet TI-RADS criteria for biopsy or dedicated follow-up.   IMPRESSION: 1. Enlarged heterogenous thyroid  with nodules as above. None meet criteria for biopsy. 2. Recommend annual/biennial ultrasound follow-up of right nodule as above, until stability x5 years confirmed.   The above is in keeping with the ACR TI-RADS recommendations - J Am Coll Radiol  2017;14:587-595.     Electronically Signed   By: Nicoletta Barrier M.D.   On: 09/07/2020 08:13 --------------------------------------------------------------------------------------------------------- Thyroid  US  from 06/02/21 CLINICAL DATA:  Goiter.   EXAM: THYROID  ULTRASOUND   TECHNIQUE: Ultrasound examination of the thyroid  gland and adjacent soft tissues was performed.   COMPARISON:  09/06/2020, 07/19/2018   FINDINGS: Parenchymal Echotexture: Moderately heterogenous   Isthmus: 1.5 cm, previously 1.4 cm   Right lobe: 5.1 x 2.8 x 2.3 cm, previously 6.1 x 1.2 x 2.3 cm   Left lobe: 4.6 x 2.5 x 2.7 cm, previously 5.9 x 3.0 x 2.6 cm   _________________________________________________________   Estimated total number of nodules >/= 1 cm: 3   Number of spongiform nodules >/=  2 cm not described below (TR1): 0   Number of mixed cystic and solid nodules >/= 1.5 cm not described below (TR2): 0   _________________________________________________________   Nodule # 1:   Prior biopsy: No   Location: Isthmus; Inferior   Maximum size: 1.4 cm; Other 2 dimensions: 1.0 x 0.9 cm, previously, 1.2 x 1.0 x 0.8 cm   Composition: solid/almost completely solid (2)   Echogenicity: hyperechoic (1)   Shape: not taller-than-wide (0)   Margins: ill-defined (0)   Echogenic foci: none (0)   ACR TI-RADS total points: 3.   ACR TI-RADS risk category:  TR3 (3 points).   Significant change in size (>/= 20% in two dimensions and minimal increase of 2 mm): No   Change in features: No   Change in ACR TI-RADS risk category: No   ACR TI-RADS recommendations:   Given size (<1.5 cm) and appearance, this nodule does NOT meet TI-RADS criteria for biopsy or dedicated follow-up.   _________________________________________________________   Nodule # 2 (previously labeled 1):   Prior biopsy: No   Location: Right; Superior   Maximum size: 1.5 cm; Other 2 dimensions: 1.3 x 1.2 cm,  previously, 1.6 x 1.1 x 1.4 cm   Composition: solid/almost completely solid (2)   Echogenicity: hyperechoic (1)   Shape: not taller-than-wide (0)   Margins: smooth (0)   Echogenic foci: none (0)   ACR TI-RADS total points: 3.   ACR TI-RADS risk category:  TR3 (3 points).   Significant change in size (>/= 20% in two dimensions and minimal increase of 2 mm): No   Change in features: No   Change in ACR TI-RADS risk category: No   ACR TI-RADS recommendations:   *Given size (>/= 1.5 - 2.4 cm) and appearance, a follow-up ultrasound in 1 year should be considered based on TI-RADS criteria.   _________________________________________________________  Nodule # 3:   Prior biopsy: No   Location: Left; Superior   Maximum size: 1.0 cm; Other 2 dimensions: 0.9 x 0.8 cm, previously, 1.2 x 0.8 x 0.9 cm   Composition: solid/almost completely solid (2)   Echogenicity: hyperechoic (1)   Shape: not taller-than-wide (0)   Margins: smooth (0)   Echogenic foci: none (0)   ACR TI-RADS total points: 3.   ACR TI-RADS risk category:  TR3 (3 points).   Significant change in size (>/= 20% in two dimensions and minimal increase of 2 mm): No   Change in features: No   Change in ACR TI-RADS risk category: No   ACR TI-RADS recommendations:   Given size (<1.4 cm) and appearance, this nodule does NOT meet TI-RADS criteria for biopsy or dedicated follow-up.   _________________________________________________________   IMPRESSION: 1. Similar appearing enlarged, heterogenous thyroid  gland. 2. Similar appearance of previously visualized right superior solid thyroid  nodule (labeled 2, 1.5 cm, previously 1.6 cm) which again meets criteria (TI-RADS category 3) for 1 year ultrasound surveillance. This study marks 3 years stability. 3. The remaining visualized thyroid  nodules appear benign and do not warrant additional follow-up.   The above is in keeping with the ACR TI-RADS  recommendations - J Am Coll Radiol 2017;14:587-595.   Creasie Doctor, MD   [facility]   [facility]     Electronically Signed   By: Creasie Doctor M.D.   On: 06/02/2021 14:28 -------------------------------------------------------------------------------------------------  Thyroid  US  from 03/16/23 CLINICAL DATA:  Hypothyroid.  Hashimoto's disease.   EXAM: THYROID  ULTRASOUND   TECHNIQUE: Ultrasound examination of the thyroid  gland and adjacent soft tissues was performed.   COMPARISON:  Multiple prior thyroid  ultrasounds including 05/25/2022; 06/02/2021; 09/06/2020; 07/19/2018   FINDINGS: Parenchymal Echotexture: Moderately heterogenous   Isthmus: 1.7 cm   Right lobe: 5.4 x 3.2 x 2.7 cm   Left lobe: 5.7 x 3.1 x 2.8 cm   _________________________________________________________   Estimated total number of nodules >/= 1 cm: 1   Number of spongiform nodules >/=  2 cm not described below (TR1): 0   Number of mixed cystic and solid nodules >/= 1.5 cm not described below (TR2): 0   _________________________________________________________   Nodule # 1: Solid hyperechoic nodule within the right mid gland measures 1.6 x 1.5 x 1.2 cm which is smaller compared to 1.8 x 1.8 x 1.3 cm measured previously. Decreasing size over time is consistent with benignity. No further follow-up is recommended.   Stable small dystrophic calcification present in the left mid gland measuring approximately 6 mm.   IMPRESSION: 1. Similar appearance of enlarged and heterogeneous thyroid  gland consistent with the clinical history of Hashimoto's thyroiditis. 2. Nodule within the right mid gland is decreasing in size over time consistent with benignity. No further follow-up is recommended for this lesion.   The above is in keeping with the ACR TI-RADS recommendations - J Am Coll Radiol 2017;14:587-595.     Electronically Signed   By: Fernando Hoyer M.D.   On: 03/21/2023 08:23    Latest Reference Range & Units 04/07/22 16:11 07/31/22 09:58 11/05/22 16:08 02/18/23 13:54 03/03/23 11:36 06/15/23 15:28  TSH 0.450 - 4.500 uIU/mL 4.630 (H) 2.770 2.650 2.58 2.640 0.523  T4,Free(Direct) 0.82 - 1.77 ng/dL 4.09 8.11 9.14  7.82 9.56  (H): Data is abnormally high  Assessment & Plan:   ASSESSMENT / PLAN:  1. Hypothyroidism- r/t Hashimoto's thyroiditis  Patient with relatively new hypothyroidism, on levothyroxine  therapy. Her antibody  testing confirms suspicion of autoimmune thyroid  dysfunction.    Her previsit thyroid  function tests are consistent with appropriate hormone replacement.   She is advised to continue her Levothyroxine  112 mcg po daily before breakfast.  Will recheck TFTs prior to next visit and adjust dose further if needed.  - We discussed about correct intake of levothyroxine , at fasting, with water , separated by at least 30 minutes from breakfast, and separated by more than 4 hours from calcium , iron, multivitamins, acid reflux medications (PPIs). -Patient is made aware of the fact that thyroid  hormone replacement is needed for life, dose to be adjusted by periodic monitoring of thyroid  function tests.  2. Multinodular thyroid  Multiple thyroid  nodules recommending follow up with surveillance ultrasound in 1 year (around 05/2023).  Her repeat thyroid  ultrasound from 03/16/23 shows slight decrease in thyroid  nodule size, thus favoring benignity and therefore she will not need additional surveillance ultrasounds at this time.    I spent  14  minutes in the care of the patient today including review of labs from Thyroid  Function, CMP, and other relevant labs ; imaging/biopsy records (current and previous including abstractions from other facilities); face-to-face time discussing  her lab results and symptoms, medications doses, her options of short and long term treatment based on the latest standards of care / guidelines;    and documenting the encounter.  Aniesa P Grammatico  participated in the discussions, expressed understanding, and voiced agreement with the above plans.  All questions were answered to her satisfaction. she is encouraged to contact clinic should she have any questions or concerns prior to her return visit.   FOLLOW UP PLAN:  Return in about 4 months (around 10/17/2023) for Thyroid  follow up, Previsit labs.  Hulon Magic, [facility] [address] [place], [place] [address] Phone: [phone] Fax: [phone]  06/17/2023, 9:56 AM

## 2023-06-17 NOTE — Patient Instructions (Signed)

## 2023-07-28 ENCOUNTER — Ambulatory Visit: Admitting: Family Medicine

## 2023-08-04 NOTE — Addendum Note (Signed)
 Addended by: Maudine Sos C on: 08/04/2023 04:22 PM   Modules accepted: Orders

## 2023-08-19 ENCOUNTER — Encounter: Payer: Self-pay | Admitting: Family Medicine

## 2023-08-19 ENCOUNTER — Ambulatory Visit: Admitting: Family Medicine

## 2023-08-19 VITALS — BP 174/88 | HR 47 | Resp 18 | Ht 64.0 in | Wt 143.0 lb

## 2023-08-19 DIAGNOSIS — I1A0 Resistant hypertension: Secondary | ICD-10-CM | POA: Diagnosis not present

## 2023-08-19 MED ORDER — CARVEDILOL 6.25 MG PO TABS: 6.2500 mg | ORAL_TABLET | Freq: Two times a day (BID) | ORAL | 3 refills | Status: DC

## 2023-08-19 MED ORDER — CLONIDINE 0.3 MG/24HR TD PTWK: 0.3000 mg | MEDICATED_PATCH | TRANSDERMAL | 5 refills | Status: DC

## 2023-08-19 NOTE — Patient Instructions (Addendum)
 F/U in 8 to 10 week re evaluate blood pressure  Stop amlodipine   New is catapress patch once weekly  New higher dose of carvedilol  6.25 mg one twice daily  Continuelosartan/hydrochlorothiazide  as before  It is important that you exercise regularly at least 30 minutes 5 times a week. If you develop chest pain, have severe difficulty breathing, or feel very tired, stop exercising immediately and seek medical attention    Thanks for choosing [facility], we consider it a privelige to serve you.

## 2023-08-22 ENCOUNTER — Encounter: Payer: Self-pay | Admitting: Family Medicine

## 2023-08-22 NOTE — Assessment & Plan Note (Signed)
 Uncontrolled , intolerant of amlodipine  , New is catapress patch 0.3 mg and coreg  3.125 mg twice daily, continue daily losartan / hydrochlorothiazide  as before DASH diet and commitment to daily physical activity for a minimum of 30 minutes discussed and encouraged, as a part of hypertension management. The importance of attaining a healthy weight is also discussed.     08/19/2023   12:09 PM 08/19/2023   11:25 AM 06/17/2023    9:21 AM 05/18/2023   11:07 AM 05/18/2023   10:23 AM 05/18/2023   10:10 AM 03/18/2023   11:43 AM  BP/Weight  Systolic BP 174 174 136 160 150 156 130  Diastolic BP 88 79 82 80 71 68 70  Wt. (Lbs)  143.04 146   147   BMI  24.55 kg/m2 25.06 kg/m2   25.23 kg/m2      F/u in 6 to 8 weeks

## 2023-08-22 NOTE — Progress Notes (Signed)
   Faith Hood     MRN: [id number]      DOB: 12-27-1955  Chief Complaint  Patient presents with   Hypertension    8 week follow up. Bp has been running fine at home. Stopped taking amlodipine  everyday for a couple weeks due to swelling in foot     HPI Faith Hood is here for follow up and re-evaluation of uncontrolled  hypertension Stopped amlodipine  due to swelling and increased pain in feet No other health concerns are addressed or voiced ROS Denies recent fever or chills. Denies sinus pressure, nasal congestion, ear pain or sore throat. Denies chest congestion, productive cough or wheezing. Denies chest pains, palpitations PND or orthopnea Denies abdominal pain, nausea, vomiting,diarrhea or constipation.   Denies dysuria, frequency, hesitancy or incontinence.  Denies depression, anxiety or insomnia. Denies skin break down or rash.   PE  BP (!) 174/88   Pulse (!) 47   Resp 18   Ht 5' 4 (1.626 m)   Wt 143 lb 0.6 oz (64.9 kg)   SpO2 98%   BMI 24.55 kg/m   Patient alert and oriented and in no cardiopulmonary distress.  HEENT: No facial asymmetry, EOMI,     Neck supple .  Chest: Clear to auscultation bilaterally.  CVS: S1, S2 no murmurs, no S3.Regular rate.  ABD: Soft non tender.   Ext: No edema  MS: Adequate ROM spine, shoulders, hips and knees.  Skin: Intact, no ulcerations or rash noted.  Psych: Good eye contact, normal affect. Memory intact not anxious or depressed appearing.  CNS: CN 2-12 intact, power,  normal throughout.no focal deficits noted.   Assessment & Plan  Resistant hypertension Uncontrolled , intolerant of amlodipine  , New is catapress patch 0.3 mg and coreg  3.125 mg twice daily, continue daily losartan / hydrochlorothiazide  as before DASH diet and commitment to daily physical activity for a minimum of 30 minutes discussed and encouraged, as a part of hypertension management. The importance of attaining a healthy weight is also discussed.      08/19/2023   12:09 PM 08/19/2023   11:25 AM 06/17/2023    9:21 AM 05/18/2023   11:07 AM 05/18/2023   10:23 AM 05/18/2023   10:10 AM 03/18/2023   11:43 AM  BP/Weight  Systolic BP 174 174 136 160 150 156 130  Diastolic BP 88 79 82 80 71 68 70  Wt. (Lbs)  143.04 146   147   BMI  24.55 kg/m2 25.06 kg/m2   25.23 kg/m2      F/u in 6 to 8 weeks

## 2023-09-28 DIAGNOSIS — R200 Anesthesia of skin: Secondary | ICD-10-CM | POA: Diagnosis not present

## 2023-09-28 DIAGNOSIS — Z7952 Long term (current) use of systemic steroids: Secondary | ICD-10-CM | POA: Diagnosis not present

## 2023-09-28 DIAGNOSIS — M810 Age-related osteoporosis without current pathological fracture: Secondary | ICD-10-CM | POA: Diagnosis not present

## 2023-09-28 DIAGNOSIS — Z6824 Body mass index (BMI) 24.0-24.9, adult: Secondary | ICD-10-CM | POA: Diagnosis not present

## 2023-09-28 DIAGNOSIS — R5383 Other fatigue: Secondary | ICD-10-CM | POA: Diagnosis not present

## 2023-09-28 DIAGNOSIS — M300 Polyarteritis nodosa: Secondary | ICD-10-CM | POA: Diagnosis not present

## 2023-10-14 ENCOUNTER — Ambulatory Visit: Admitting: Family Medicine

## 2023-10-14 ENCOUNTER — Encounter: Payer: Self-pay | Admitting: Family Medicine

## 2023-10-14 VITALS — BP 180/86 | HR 51 | Resp 16 | Ht 63.0 in | Wt 142.1 lb

## 2023-10-14 DIAGNOSIS — E038 Other specified hypothyroidism: Secondary | ICD-10-CM | POA: Diagnosis not present

## 2023-10-14 DIAGNOSIS — M300 Polyarteritis nodosa: Secondary | ICD-10-CM | POA: Diagnosis not present

## 2023-10-14 DIAGNOSIS — E7849 Other hyperlipidemia: Secondary | ICD-10-CM | POA: Diagnosis not present

## 2023-10-14 DIAGNOSIS — E559 Vitamin D deficiency, unspecified: Secondary | ICD-10-CM | POA: Insufficient documentation

## 2023-10-14 DIAGNOSIS — I1A0 Resistant hypertension: Secondary | ICD-10-CM

## 2023-10-14 MED ORDER — CARVEDILOL 12.5 MG PO TABS: 12.5000 mg | ORAL_TABLET | Freq: Two times a day (BID) | ORAL | 3 refills | Status: DC

## 2023-10-14 NOTE — Assessment & Plan Note (Signed)
 Hyperlipidemia:Low fat diet discussed and encouraged.   Lipid Panel  Lab Results  Component Value Date   CHOL 126 05/17/2023   HDL 37 (L) 05/17/2023   LDLCALC 72 05/17/2023   TRIG 87 05/17/2023   CHOLHDL 3.4 05/17/2023     Updated lab needed at/ before next visit.

## 2023-10-14 NOTE — Assessment & Plan Note (Signed)
 On methotrexate by Rheumatology

## 2023-10-14 NOTE — Progress Notes (Signed)
   Faith Hood     MRN: [id number]      DOB: [birth year]  Chief Complaint  Patient presents with   Hypertension    8 week follow up. Bp has been running good at home     HPI Faith Hood is here for follow up and re-evaluation of chronic medical conditions, medication management and review of any available recent lab and radiology data.  Preventive health is updated, specifically  Cancer screening and Immunization.   Stopped cloniodone patch due to drowsy side effect , reports good home BP and will return with her cuff and meds  ROS Denies recent fever or chills. Denies sinus pressure, nasal congestion, ear pain or sore throat. Denies chest congestion, productive cough or wheezing. Denies chest pains, palpitations and leg swelling Denies abdominal pain, nausea, vomiting,diarrhea or constipation.   Denies dysuria, frequency, hesitancy or incontinence. Denies joint pain, swelling and limitation in mobility. Denies headaches, seizures, numbness, or tingling. Denies depression, anxiety or insomnia. Denies skin break down or rash.   PE  BP (!) 180/86   Pulse (!) 51   Resp 16   Ht 5' 3 (1.6 m)   Wt 142 lb 1.9 oz (64.5 kg)   SpO2 97%   BMI 25.18 kg/m   Patient alert and oriented and in no cardiopulmonary distress.  HEENT: No facial asymmetry, EOMI,     Neck supple .  Chest: Clear to auscultation bilaterally.  CVS: S1, S2 no murmurs, no S3.Regular rate.  ABD: Soft non tender.   Ext: No edema  MS: Adequate ROM spine, shoulders, hips and knees.  Skin: Intact, no ulcerations or rash noted.  Psych: Good eye contact, normal affect. Memory intact not anxious or depressed appearing.  CNS: CN 2-12 intact, power,  normal throughout.no focal deficits noted.   Assessment & Plan  Resistant hypertension DASH diet and commitment to daily physical activity for a minimum of 30 minutes discussed and encouraged, as a part of hypertension management. The importance of attaining a  healthy weight is also discussed.     10/14/2023   11:46 AM 10/14/2023   11:35 AM 08/19/2023   12:09 PM 08/19/2023   11:25 AM 06/17/2023    9:21 AM 05/18/2023   11:07 AM 05/18/2023   10:23 AM  BP/Weight  Systolic BP 180 183 174 174 136 160 150  Diastolic BP 86 84 88 79 82 80 71  Wt. (Lbs)  142.12  143.04 146    BMI  25.18 kg/m2  24.55 kg/m2 25.06 kg/m2       Increase coreg  to 12.5 mg twice daily   Hyperlipidemia Hyperlipidemia:Low fat diet discussed and encouraged.   Lipid Panel  Lab Results  Component Value Date   CHOL 126 05/17/2023   HDL 37 (L) 05/17/2023   LDLCALC 72 05/17/2023   TRIG 87 05/17/2023   CHOLHDL 3.4 05/17/2023     Updated lab needed at/ before next visit.   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

## 2023-10-14 NOTE — Assessment & Plan Note (Signed)
 Managed by endo

## 2023-10-14 NOTE — Assessment & Plan Note (Signed)
 Updated lab needed at/ before next visit.

## 2023-10-14 NOTE — Patient Instructions (Addendum)
 F/U in [place] with medication and BP cuff  Need to return today with bP cuff and meds at 1 pm , MD to review  Fasting lipid, cmp and eGFr  CBC and vit D 3 to 7 days before Nov appt  Thanks for choosing [facility], we consider it a privelige to serve you.  Please note since your repeat blood pressure with your cuff is still high , I increased the carvedilol  dose to 12 5 mg twice daily and this is sent to your Pharmacy

## 2023-10-14 NOTE — Assessment & Plan Note (Signed)
 DASH diet and commitment to daily physical activity for a minimum of 30 minutes discussed and encouraged, as a part of hypertension management. The importance of attaining a healthy weight is also discussed.     10/14/2023   11:46 AM 10/14/2023   11:35 AM 08/19/2023   12:09 PM 08/19/2023   11:25 AM 06/17/2023    9:21 AM 05/18/2023   11:07 AM 05/18/2023   10:23 AM  BP/Weight  Systolic BP 180 183 174 174 136 160 150  Diastolic BP 86 84 88 79 82 80 71  Wt. (Lbs)  142.12  143.04 146    BMI  25.18 kg/m2  24.55 kg/m2 25.06 kg/m2       Increase coreg  to 12.5 mg twice daily

## 2023-10-15 ENCOUNTER — Telehealth: Payer: Self-pay

## 2023-10-15 NOTE — Telephone Encounter (Signed)
 Copied from CRM [id number]. Topic: General - Other >> Oct 15, 2023  9:57 AM Travis F wrote: Reason for CRM: Patient is calling in returning a call from the office

## 2023-10-19 ENCOUNTER — Ambulatory Visit: Admitting: Nurse Practitioner

## 2023-10-28 ENCOUNTER — Ambulatory Visit: Admitting: Nurse Practitioner

## 2023-11-02 ENCOUNTER — Other Ambulatory Visit: Payer: Self-pay | Admitting: Family Medicine

## 2023-12-13 ENCOUNTER — Other Ambulatory Visit: Payer: Self-pay | Admitting: Nurse Practitioner

## 2023-12-28 DIAGNOSIS — E7849 Other hyperlipidemia: Secondary | ICD-10-CM | POA: Diagnosis not present

## 2023-12-28 DIAGNOSIS — I1A0 Resistant hypertension: Secondary | ICD-10-CM | POA: Diagnosis not present

## 2023-12-28 DIAGNOSIS — E559 Vitamin D deficiency, unspecified: Secondary | ICD-10-CM | POA: Diagnosis not present

## 2023-12-29 ENCOUNTER — Ambulatory Visit: Payer: Self-pay | Admitting: Family Medicine

## 2023-12-29 DIAGNOSIS — M810 Age-related osteoporosis without current pathological fracture: Secondary | ICD-10-CM | POA: Diagnosis not present

## 2023-12-29 DIAGNOSIS — Z7952 Long term (current) use of systemic steroids: Secondary | ICD-10-CM | POA: Diagnosis not present

## 2023-12-29 DIAGNOSIS — Z6824 Body mass index (BMI) 24.0-24.9, adult: Secondary | ICD-10-CM | POA: Diagnosis not present

## 2023-12-29 DIAGNOSIS — R200 Anesthesia of skin: Secondary | ICD-10-CM | POA: Diagnosis not present

## 2023-12-29 DIAGNOSIS — M300 Polyarteritis nodosa: Secondary | ICD-10-CM | POA: Diagnosis not present

## 2023-12-29 DIAGNOSIS — R5383 Other fatigue: Secondary | ICD-10-CM | POA: Diagnosis not present

## 2023-12-29 LAB — CBC WITH DIFFERENTIAL/PLATELET
Basophils Absolute: 0 x10E3/uL (ref 0.0–0.2)
Basos: 0 %
EOS (ABSOLUTE): 0.1 x10E3/uL (ref 0.0–0.4)
Eos: 2 %
Hematocrit: 35.8 % (ref 34.0–46.6)
Hemoglobin: 11.4 g/dL (ref 11.1–15.9)
Immature Grans (Abs): 0 x10E3/uL (ref 0.0–0.1)
Immature Granulocytes: 0 %
Lymphocytes Absolute: 1.5 x10E3/uL (ref 0.7–3.1)
Lymphs: 31 %
MCH: 28 pg (ref 26.6–33.0)
MCHC: 31.8 g/dL (ref 31.5–35.7)
MCV: 88 fL (ref 79–97)
Monocytes Absolute: 0.3 x10E3/uL (ref 0.1–0.9)
Monocytes: 5 %
Neutrophils Absolute: 3.1 x10E3/uL (ref 1.4–7.0)
Neutrophils: 62 %
Platelets: 205 x10E3/uL (ref 150–450)
RBC: 4.07 x10E6/uL (ref 3.77–5.28)
RDW: 13.6 % (ref 11.7–15.4)
WBC: 5 x10E3/uL (ref 3.4–10.8)

## 2023-12-29 LAB — CMP14+EGFR
ALT: 7 IU/L (ref 0–32)
AST: 13 IU/L (ref 0–40)
Albumin: 4.3 g/dL (ref 3.9–4.9)
Alkaline Phosphatase: 73 IU/L (ref 49–135)
BUN/Creatinine Ratio: 16 (ref 12–28)
BUN: 15 mg/dL (ref 8–27)
Bilirubin Total: 0.4 mg/dL (ref 0.0–1.2)
CO2: 24 mmol/L (ref 20–29)
Calcium: 9.6 mg/dL (ref 8.7–10.3)
Chloride: 103 mmol/L (ref 96–106)
Creatinine, Ser: 0.94 mg/dL (ref 0.57–1.00)
Globulin, Total: 3 g/dL (ref 1.5–4.5)
Glucose: 82 mg/dL (ref 70–99)
Potassium: 3.6 mmol/L (ref 3.5–5.2)
Sodium: 139 mmol/L (ref 134–144)
Total Protein: 7.3 g/dL (ref 6.0–8.5)
eGFR: 67 mL/min/1.73 (ref >59)

## 2023-12-29 LAB — LIPID PANEL
Chol/HDL Ratio: 3.5 ratio (ref 0.0–4.4)
Cholesterol, Total: 140 mg/dL (ref 100–199)
HDL: 40 mg/dL (ref >39)
LDL Chol Calc (NIH): 84 mg/dL (ref 0–99)
Triglycerides: 84 mg/dL (ref 0–149)
VLDL Cholesterol Cal: 16 mg/dL (ref 5–40)

## 2023-12-29 LAB — VITAMIN D 25 HYDROXY (VIT D DEFICIENCY, FRACTURES): Vit D, 25-Hydroxy: 91.4 ng/mL (ref 30.0–100.0)

## 2023-12-30 ENCOUNTER — Encounter: Payer: Self-pay | Admitting: Family Medicine

## 2023-12-30 ENCOUNTER — Ambulatory Visit: Admitting: Family Medicine

## 2023-12-30 VITALS — BP 156/82 | HR 56 | Resp 16 | Ht 63.0 in | Wt 143.0 lb

## 2023-12-30 DIAGNOSIS — I1A0 Resistant hypertension: Secondary | ICD-10-CM

## 2023-12-30 DIAGNOSIS — Z23 Encounter for immunization: Secondary | ICD-10-CM | POA: Diagnosis not present

## 2023-12-30 DIAGNOSIS — Z0000 Encounter for general adult medical examination without abnormal findings: Secondary | ICD-10-CM

## 2023-12-30 NOTE — Patient Instructions (Addendum)
 Follow-up in 16 weeks call if you need me sooner.  Nurse blood pressure check in 2 weeks please bring your medication and follow-up also.  Flu vaccine in office today.  Please schedule mammogram at checkout.  Blood work shows excellent and normal blood count  kidney and liver function and cholesterol.  Cologuard is due in  2026 for colon cancer screening.  Please note blood pressure medications are to be taken and as prescribed, you need to take 3 tablets every day for your blood pressure.  In the morning take Hyzaar with 1 carvedilol  tablet at 8:30 AM  In the evening take 1 carvedilol  tablets at 8:30 PM  Decrease vit D tablets to 4 days per week, you do not need them every day  Thanks for choosing [facility], we consider it a privelige to serve you.

## 2023-12-30 NOTE — Progress Notes (Signed)
    Faith Hood     MRN: [id number]      DOB: 1955-05-30  Chief Complaint  Patient presents with   Annual Exam   Annual exam HPI: Patient is in for annual physical exam. No other health concerns are expressed or addressed at the visit. Recent labs,  are reviewed. Immunization is reviewed , and  updated .   PE: BP (!) 156/82   Pulse (!) 56   Resp 16   Ht 5' 3 (1.6 m)   Wt 143 lb (64.9 kg)   SpO2 97%   BMI 25.33 kg/m   Pleasant  female, alert and oriented x 3, in no cardio-pulmonary distress. Afebrile. HEENT No facial trauma or asymetry. Sinuses non tender.  Extra occullar muscles intact.. External ears normal, . Neck: supple, no adenopathy,JVD or thyromegaly.No bruits.  Chest: Clear to ascultation bilaterally.No crackles or wheezes. Non tender to palpation   Cardiovascular system; Heart sounds normal,  S1 and  S2 ,no S3.  No murmur, or thrill. Apical beat not displaced Peripheral pulses normal.  Abdomen: Soft, non tender, no organomegaly or masses. No bruits. Bowel sounds normal. No guarding, tenderness or rebound.   Musculoskeletal exam: Full ROM of spine, hips , shoulders and knees. No deformity ,swelling or crepitus noted. No muscle wasting or atrophy.   Neurologic: Cranial nerves 2 to 12 intact. Power, tone ,sensation and reflexes normal throughout. No disturbance in gait. No tremor.  Skin: Intact, no ulceration, erythema , scaling or rash noted. Pigmentation normal throughout  Psych; Normal mood and affect. Judgement and concentration normal   Assessment & Plan:  Annual physical exam Annual exam as documented. Counseling done  re healthy lifestyle involving commitment to 150 minutes exercise per week, heart healthy diet, and attaining healthy weight.The importance of adequate sleep also discussed. Regular seat belt use and home safety, is also discussed. Changes in health habits are decided on by the patient with goals and time frames  set  for achieving them. Immunization and cancer screening needs are specifically addressed at this visit.   Resistant hypertension DASH diet and commitment to daily physical activity for a minimum of 30 minutes discussed and encouraged, as a part of hypertension management. The importance of attaining a healthy weight is also discussed.     12/30/2023    2:11 PM 12/30/2023    2:10 PM 12/30/2023    1:32 PM 10/14/2023   11:46 AM 10/14/2023   11:35 AM 08/19/2023   12:09 PM 08/19/2023   11:25 AM  BP/Weight  Systolic BP 156 160 197 180 183 174 174  Diastolic BP 82 82 77 86 84 88 79  Wt. (Lbs)   143  142.12  143.04  BMI   25.33 kg/m2  25.18 kg/m2  24.55 kg/m2     Uncontrolled as inadvertently not taking medication as prescribed, nurse BP check in 2 weeks  Influenza vaccination administered at current visit After obtaining informed consent, the vaccine is  administered , with no adverse effect noted at the time of administration.

## 2024-01-02 ENCOUNTER — Encounter: Payer: Self-pay | Admitting: Family Medicine

## 2024-01-02 DIAGNOSIS — Z23 Encounter for immunization: Secondary | ICD-10-CM | POA: Insufficient documentation

## 2024-01-02 NOTE — Assessment & Plan Note (Signed)
 DASH diet and commitment to daily physical activity for a minimum of 30 minutes discussed and encouraged, as a part of hypertension management. The importance of attaining a healthy weight is also discussed.     12/30/2023    2:11 PM 12/30/2023    2:10 PM 12/30/2023    1:32 PM 10/14/2023   11:46 AM 10/14/2023   11:35 AM 08/19/2023   12:09 PM 08/19/2023   11:25 AM  BP/Weight  Systolic BP 156 160 197 180 183 174 174  Diastolic BP 82 82 77 86 84 88 79  Wt. (Lbs)   143  142.12  143.04  BMI   25.33 kg/m2  25.18 kg/m2  24.55 kg/m2     Uncontrolled as inadvertently not taking medication as prescribed, nurse BP check in 2 weeks

## 2024-01-02 NOTE — Assessment & Plan Note (Signed)

## 2024-01-02 NOTE — Assessment & Plan Note (Signed)
 After obtaining informed consent, the vaccine is  administered , with no adverse effect noted at the time of administration.

## 2024-01-12 ENCOUNTER — Ambulatory Visit: Admitting: Dermatology

## 2024-01-19 ENCOUNTER — Ambulatory Visit

## 2024-01-19 DIAGNOSIS — I1A0 Resistant hypertension: Secondary | ICD-10-CM | POA: Diagnosis not present

## 2024-01-19 NOTE — Progress Notes (Signed)
 Patient is in office today for a nurse visit for Blood Pressure Check. Patient blood pressure was 150/55, Patient No chest pain, No shortness of breath, No dyspnea on exertion, No orthopnea, No paroxysmal nocturnal dyspnea, No edema, No palpitations, No syncope

## 2024-01-24 ENCOUNTER — Encounter: Payer: Self-pay | Admitting: Dermatology

## 2024-01-24 ENCOUNTER — Ambulatory Visit: Admitting: Dermatology

## 2024-01-24 ENCOUNTER — Other Ambulatory Visit (HOSPITAL_COMMUNITY): Payer: Self-pay | Admitting: Family Medicine

## 2024-01-24 VITALS — BP 159/74 | HR 52

## 2024-01-24 DIAGNOSIS — M300 Polyarteritis nodosa: Secondary | ICD-10-CM | POA: Diagnosis not present

## 2024-01-24 DIAGNOSIS — Z1231 Encounter for screening mammogram for malignant neoplasm of breast: Secondary | ICD-10-CM

## 2024-01-24 DIAGNOSIS — R21 Rash and other nonspecific skin eruption: Secondary | ICD-10-CM

## 2024-01-24 DIAGNOSIS — I781 Nevus, non-neoplastic: Secondary | ICD-10-CM | POA: Diagnosis not present

## 2024-01-24 DIAGNOSIS — D489 Neoplasm of uncertain behavior, unspecified: Secondary | ICD-10-CM

## 2024-01-24 DIAGNOSIS — L309 Dermatitis, unspecified: Secondary | ICD-10-CM

## 2024-01-24 MED ORDER — CLOBETASOL PROPIONATE 0.05 % EX CREA: 1.0000 | TOPICAL_CREAM | Freq: Two times a day (BID) | CUTANEOUS | 2 refills | Status: AC

## 2024-01-24 NOTE — Progress Notes (Signed)
 New Patient Visit  Patient (and/or pt guardian) consented to the use of AI-assisted tools for note generation.    Subjective  Faith Hood is a 68 y.o. female who presents for the following: Rash - Patient reports she has an autoimmune disease, Polyarteritis Nodosa (HCC)  Located at the lower extremities that she would like to have examined.  Patient reports the areas have been there for 2 years Patient reports it started at the lower legs and then spread to thighs She reports the areas are bothersome and itch Patient rates irritation 7 out of 10.  Patient reports areas flare up around twice a month Patient reports she has not previously been treated for these areas. Patient reports she is taking Mycophenalate (Cellcept) 500 mg and was told by her PCP that this medication may help with these symptoms Patient reports she is currently using baby lotions Patient reports she is currently using Ivory Soap  The following portions of the chart were reviewed this encounter and updated as appropriate: medications, allergies, medical history  Review of Systems:  No other skin or systemic complaints except as noted in HPI or Assessment and Plan.  Objective  Well appearing patient in no apparent distress; mood and affect are within normal limits.  A focused examination was performed of the following areas: Lower extremities  Relevant exam findings are noted in the Assessment and Plan.         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 Rash is sometimes raised and dark, suggestive of vasculitis. Differential includes vasculitis related to polyarteritis nodosa. Biopsy planned to confirm diagnosis. Non-compliance with mycophenolate mofetil may contribute to skin flare.  - Performed punch biopsy of affected skin to confirm vasculitis  diagnosis. - Prescribed clobetasol cream to apply twice daily for two weeks, then two weeks off. - Advised follow-up in two weeks for suture removal and biopsy results review. -Will check labs: ESR, CRP, C-ANCA, P-ANCA  Polyarteritis nodosa Diagnosed via biopsy, managed with mycophenolate mofetil. Reports drowsiness with mycophenolate mofetil and previous medications, leading to non-compliance. Non-compliance may exacerbate vasculitis and affect vital organs.  - Discussed medication side effects and potential alternatives with patient; advised follow-up with rheumatologist regarding management. - Emphasized importance of medication adherence to prevent organ inflammation.  Hypertension Blood pressure elevated today, possibly due to missed doses of antihypertensive medication. - Advised taking missed antihypertensive medication doses upon returning home.     NEOPLASM OF UNCERTAIN BEHAVIOR Right Lower Leg - Anterior Skin / nail biopsy Type of biopsy: punch   Informed consent: discussed and consent obtained   Timeout: patient name, date of birth, surgical site, and procedure verified   Procedure prep:  Patient was prepped and draped in usual sterile fashion Prep type:  Isopropyl alcohol Anesthesia: the lesion was anesthetized in a standard fashion   Anesthetic:  1% lidocaine w/ epinephrine 1-100,000 local infiltration Punch size:  3 mm Suture size:  3-0 Suture type: Prolene (polypropylene)   Hemostasis achieved with: suture and pressure   Outcome: patient tolerated procedure well   Post-procedure details: wound care instructions given    Specimen A - Surgical pathology Differential Diagnosis: r/o Vasculitis vs other  Check Margins: No Related Procedures Sedimentation Rate C-reactive Protein ANCA Profile DERMATITIS   Related Medications clobetasol cream (TEMOVATE) 0.05 % Apply 1 Application topically 2 (two) times daily.  Return in about 2 weeks (around 02/07/2024) for  Suture removal/ bx results.  LILLETTE Lyle Cords, am acting as a neurosurgeon for [employer], DO .   Documentation: I have reviewed the above documentation for accuracy and completeness, and I agree with the above.  Delon Lenis, DO

## 2024-01-24 NOTE — Patient Instructions (Incomplete)

## 2024-01-24 NOTE — Patient Instructions (Addendum)

## 2024-01-25 ENCOUNTER — Ambulatory Visit: Admitting: Nurse Practitioner

## 2024-01-25 DIAGNOSIS — E063 Autoimmune thyroiditis: Secondary | ICD-10-CM

## 2024-01-26 LAB — SURGICAL PATHOLOGY

## 2024-01-27 ENCOUNTER — Ambulatory Visit: Payer: Self-pay | Admitting: Dermatology

## 2024-01-27 NOTE — Progress Notes (Signed)
 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  I will discuss them in detail at her [facility] visit.   1. Skin, right lower leg - anterior :       TELANGIECTASIA

## 2024-01-31 ENCOUNTER — Other Ambulatory Visit: Payer: Self-pay | Admitting: Family Medicine

## 2024-02-01 ENCOUNTER — Encounter: Payer: Self-pay | Admitting: Family Medicine

## 2024-02-01 ENCOUNTER — Other Ambulatory Visit: Payer: Self-pay | Admitting: Family Medicine

## 2024-02-01 ENCOUNTER — Ambulatory Visit: Admitting: Family Medicine

## 2024-02-01 VITALS — BP 170/80 | HR 60 | Resp 16 | Ht 63.0 in | Wt 148.0 lb

## 2024-02-01 DIAGNOSIS — I10 Essential (primary) hypertension: Secondary | ICD-10-CM | POA: Insufficient documentation

## 2024-02-01 MED ORDER — SPIRONOLACTONE 25 MG PO TABS: 25.0000 mg | ORAL_TABLET | Freq: Every day | ORAL | 3 refills | Status: AC

## 2024-02-01 NOTE — Assessment & Plan Note (Signed)
 Uncontrolled  Pt top take coreg  12 hrs apart Spironolactone  25 mg daily is added, needs rept bmp in 4 weeks Referral too [facility] placed DASH diet and commitment to daily physical activity for a minimum of 30 minutes discussed and encouraged, as a part of hypertension management. The importance of attaining a healthy weight is also discussed.     02/01/2024   10:54 AM 02/01/2024   10:30 AM 01/24/2024   10:41 AM 01/19/2024    9:54 AM 12/30/2023    2:11 PM 12/30/2023    2:10 PM 12/30/2023    1:32 PM  BP/Weight  Systolic BP 170 185 159 150 156 160 197  Diastolic BP 80 76 74 55 82 82 77  Wt. (Lbs)  148     143  BMI  26.22 kg/m2     25.33 kg/m2

## 2024-02-01 NOTE — Progress Notes (Signed)
   [name]     MRN: [id number]      DOB: 02-14-1956  Chief Complaint  Patient presents with   Hypertension    Follow up     HPI [name] is here for follow up of uncontrolled hypertension. Undfortunately she has not brought her medication or her BP cuff, but she is able to report medication names , doses and times dhe takes her meds Mornng meds are around 8:30 to 9 Evening coreg , 8 hrs later around 4, I have advised and re educated about the 12 hr spacing for best BP control Reports getting bP vakues in the 130's at home, review of record is  consistently elevated 150 and more Has been to Hypertensive clinic in the past and ios referred again She feels well and jhas no complaints Cardiovascular and Neurologic history is negative She is attempting lifestyle modification for BP control  ROS Denies recent fever or chills. Denies sinus pressure, nasal congestion, ear pain or sore throat. Denies chest congestion, productive cough or wheezing. Denies chest pains, palpitations and leg swelling Denies abdominal pain, nausea, vomiting,diarrhea or constipation.    PE  BP (!) 170/80   Pulse 60   Resp 16   Ht 5' 3 (1.6 m)   Wt 148 lb (67.1 kg)   SpO2 97%   BMI 26.22 kg/m   Patient alert and oriented and in no cardiopulmonary distress.  HEENT: No facial asymmetry, EOMI,     Neck supple .  Chest: Clear to auscultation bilaterally.  CVS: S1, S2 no murmurs, no S3.Regular rate.  Ext: No edema  CNS: CN 2-12 intact, power,  normal throughout.no focal deficits noted.   Assessment & Plan  Malignant hypertension Uncontrolled  Pt top take coreg  12 hrs apart Spironolactone  25 mg daily is added, needs rept bmp in 4 weeks Referral too [facility] placed DASH diet and commitment to daily physical activity for a minimum of 30 minutes discussed and encouraged, as a part of hypertension management. The importance of attaining a healthy weight is also discussed.     02/01/2024   10:54 AM  02/01/2024   10:30 AM 01/24/2024   10:41 AM 01/19/2024    9:54 AM 12/30/2023    2:11 PM 12/30/2023    2:10 PM 12/30/2023    1:32 PM  BP/Weight  Systolic BP 170 185 159 150 156 160 197  Diastolic BP 80 76 74 55 82 82 77  Wt. (Lbs)  148     143  BMI  26.22 kg/m2     25.33 kg/m2

## 2024-02-01 NOTE — Patient Instructions (Addendum)
 F/U as before  New exrta med is spironolactone  25 mg daily, take internal jugular the morning with your other 2 bP meds between 8:30 and 9 am  Take your 2nd carvedilol  12 hres after first so not at 4 pm but more like between 8:30 and 9 in the evening  Non fasting BMP and eGFr 3 in 4 weeks  It is important that you exercise regularly at least 30 minutes 5 times a week. If you develop chest pain, have severe difficulty breathing, or feel very tired, stop exercising immediately and seek medical attention  Think about what you will eat, plan ahead. Choose  clean, green, fresh or frozen over canned, processed or packaged foods which are more sugary, salty and fatty. 70 to 75% of food eaten should be vegetables and fruit. Three meals at set times with snacks allowed between meals, but they must be fruit or vegetables. Aim to eat over a 12 hour period , example 7 am to 7 pm, and STOP after  your last meal of the day. Drink water ,generally about 64 ounces per day, no other drink is as healthy. Fruit juice is best enjoyed in a healthy way, by EATING the fruit.   Thanks for choosing [facility], we consider it a privelige to serve you.  Gertha Christmas an best for 2026!

## 2024-02-07 ENCOUNTER — Other Ambulatory Visit: Payer: Self-pay | Admitting: Nurse Practitioner

## 2024-02-07 ENCOUNTER — Ambulatory Visit: Admitting: Dermatology

## 2024-02-07 ENCOUNTER — Encounter: Payer: Self-pay | Admitting: Dermatology

## 2024-02-07 VITALS — BP 117/44 | HR 45

## 2024-02-07 DIAGNOSIS — I781 Nevus, non-neoplastic: Secondary | ICD-10-CM

## 2024-02-07 DIAGNOSIS — Z5189 Encounter for other specified aftercare: Secondary | ICD-10-CM

## 2024-02-07 DIAGNOSIS — E063 Autoimmune thyroiditis: Secondary | ICD-10-CM

## 2024-02-07 DIAGNOSIS — L299 Pruritus, unspecified: Secondary | ICD-10-CM

## 2024-02-07 NOTE — Patient Instructions (Addendum)
 VISIT SUMMARY:  During your visit, we discussed your ongoing issues with visible blood vessels on your skin (telangiectasias) and itching. We reviewed your current medications and biopsy results, and provided a plan to manage your symptoms and promote healing.  YOUR PLAN:  -CUTANEOUS TELANGIECTASIA:  Telangiectasias are small, visible blood vessels on the skin, often related to underlying conditions like vasculitis.  However you did not get the labs we ordered checked to see if this is related you or current diagnosis of poly arteritis or a new/ different variant of vasculitis.  They are not dangerous but are unlikely to go away completely.   Continue using clobetasol  twice daily for two weeks, then take a two-week break to avoid skin thinning. If itching persists, use an over-the-counter anti-itch cream like CeraVe twice daily for two weeks.   Keep following up with your rheumatologist and complete the lab work to check for other inflammatory conditions.  -PRURITUS:  Pruritus means chronic itching, which can worsen telangiectasias due to scratching. Continue using clobetasol  and over-the-counter anti-itch cream as directed for telangiectasia management.  -BIOPSY SITE WOUND CARE:  The biopsy site is healing but has some dryness and scabbing. Apply Aquaphor and cover with a Band-Aid to keep the area hydrated and help it heal faster.  INSTRUCTIONS:  Please complete the lab work as ordered to rule out any further inflammatory conditions. Continue to follow up with your rheumatologist for ongoing management of your vasculitis.      Important Information  Due to recent changes in healthcare laws, you may see results of your pathology and/or laboratory studies on MyChart before the doctors have had a chance to review them. We understand that in some cases there may be results that are confusing or concerning to you. Please understand that not all results are received at the same time and  often the doctors may need to interpret multiple results in order to provide you with the best plan of care or course of treatment. Therefore, we ask that you please give us  2 business days to thoroughly review all your results before contacting the office for clarification. Should we see a critical lab result, you will be contacted sooner.   If You Need Anything After Your Visit  If you have any questions or concerns for your doctor, please call our main line at [phone] If no one answers, please leave a voicemail as directed and we will return your call as soon as possible. Messages left after 4 pm will be answered the following business day.   You may also send us  a message via MyChart. We typically respond to MyChart messages within 1-2 business days.  For prescription refills, please ask your pharmacy to contact our office. Our fax number is [phone].  If you have an urgent issue when the clinic is closed that cannot wait until the next business day, you can page your doctor at the number below.    Please note that while we do our best to be available for urgent issues outside of office hours, we are not available 24/7.   If you have an urgent issue and are unable to reach us , you may choose to seek medical care at your doctor's office, retail clinic, urgent care center, or emergency room.  If you have a medical emergency, please immediately call 911 or go to the emergency department. In the event of inclement weather, please call our main line at [phone] for an update on the status of any delays  or closures.  Dermatology Medication Tips: Please keep the boxes that topical medications come in in order to help keep track of the instructions about where and how to use these. Pharmacies typically print the medication instructions only on the boxes and not directly on the medication tubes.   If your medication is too expensive, please contact our office at [phone] or send us   a message through MyChart.   We are unable to tell what your co-pay for medications will be in advance as this is different depending on your insurance coverage. However, we may be able to find a substitute medication at lower cost or fill out paperwork to get insurance to cover a needed medication.   If a prior authorization is required to get your medication covered by your insurance company, please allow us  1-2 business days to complete this process.  Drug prices often vary depending on where the prescription is filled and some pharmacies may offer cheaper prices.  The website www.goodrx.com contains coupons for medications through different pharmacies. The prices here do not account for what the cost may be with help from insurance (it may be cheaper with your insurance), but the website can give you the price if you did not use any insurance.  - You can print the associated coupon and take it with your prescription to the pharmacy.  - You may also stop by our office during regular business hours and pick up a GoodRx coupon card.  - If you need your prescription sent electronically to a different pharmacy, notify our office through [facility] or by phone at [phone]

## 2024-02-07 NOTE — Progress Notes (Signed)
° °  Follow-Up Visit  Patient (and/or pt guardian) consented to the use of AI-assisted tools for note generation.    Subjective  Faith Hood is a 68 y.o. female who presents for the following: Suture removal and bx results for rash  Patient was last evaluated on 01/24/24.  At this visit a bx was performed to confirm diagnosis of vasculitis Patient was prescribed Clobetasol  to use twice daily for two weeks Ordered lab work - ESR, CRP, C-ANCA, P-ANCA Patient reports she is using the Clobetasol  cream every day two times a day  Patient reports areas are not itching and do not feel irritated Patient reports medication changes.Patient was recently prescribed Spironolactone  for blood pressure  Patient did not have lab work done  The following portions of the chart were reviewed this encounter and updated as appropriate: medications, allergies, medical history  Review of Systems:  No other skin or systemic complaints except as noted in HPI or Assessment and Plan.  Objective  Well appearing patient in no apparent distress; mood and affect are within normal limits.  A focused examination was performed of the following areas: Bilateral legs   Relevant exam findings are noted in the Assessment and Plan.  REPORT OF DERMATOPATHOLOGY FINAL DIAGNOSIS and MICROSCOPIC   DESCRIPTION Diagnosis Skin , right lower leg - anterior TELANGIECTASIA   Microscopic Description There are dilated small blood vessels in the dermis. Histochemical stains for iron are negative. Following review of the hematoxylin and eosin sections, a PAS stain was obtained to exclude a fungal infection. The PAS stain is negative for fungal organisms. Multiple levels taken through the submitted block are examined. 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 Biopsy confirmed  telangiectasias without overt inflammation. Telangiectasias are not dangerous and unlikely  to resolve significantly.  -Discussed bx results with pt - Continue clobetasol  twice daily for two weeks, then stop for two weeks to prevent skin thinning and stretch marks. - Use over-the-counter anti-itch cream like CeraVe anti-itch twice daily for two weeks if itch persists. - Continue follow-up with rheumatologist for management of underlying vasculitis. - Complete ordered lab work to rule out further inflammatory conditions.  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 Clobetasol  is effective in managing itch. - Continue clobetasol  as per telangiectasia management plan. - Use over-the-counter anti-itch cream as per telangiectasia management plan.  Biopsy site wound care Biopsy site healing with some dryness and scabbing, which may delay healing. - Apply Aquaphor and cover with a Band-Aid to keep the site hydrated and promote healing.     No follow-ups on file.  LILLETTE Lyle Cords, am acting as a neurosurgeon for [employer], DO .   Documentation: I have reviewed the above documentation for accuracy and completeness, and I agree with the above.  Delon Lenis, DO

## 2024-02-08 ENCOUNTER — Ambulatory Visit: Payer: Self-pay | Admitting: Family Medicine

## 2024-02-08 LAB — BMP8+EGFR
BUN/Creatinine Ratio: 17 (ref 12–28)
BUN: 17 mg/dL (ref 8–27)
CO2: 24 mmol/L (ref 20–29)
Calcium: 9.8 mg/dL (ref 8.7–10.3)
Chloride: 100 mmol/L (ref 96–106)
Creatinine, Ser: 1.03 mg/dL — ABNORMAL HIGH (ref 0.57–1.00)
Glucose: 80 mg/dL (ref 70–99)
Potassium: 3.9 mmol/L (ref 3.5–5.2)
Sodium: 140 mmol/L (ref 134–144)
eGFR: 60 mL/min/1.73 (ref >59)

## 2024-02-08 LAB — T4, FREE: Free T4: 1.46 ng/dL (ref 0.82–1.77)

## 2024-02-08 LAB — TSH: TSH: 0.086 u[IU]/mL — ABNORMAL LOW (ref 0.450–4.500)

## 2024-02-09 ENCOUNTER — Encounter: Payer: Self-pay | Admitting: Nurse Practitioner

## 2024-02-09 ENCOUNTER — Ambulatory Visit: Admitting: Nurse Practitioner

## 2024-02-09 VITALS — BP 104/66 | HR 58 | Ht 63.0 in | Wt 146.0 lb

## 2024-02-09 DIAGNOSIS — E063 Autoimmune thyroiditis: Secondary | ICD-10-CM | POA: Diagnosis not present

## 2024-02-09 DIAGNOSIS — E042 Nontoxic multinodular goiter: Secondary | ICD-10-CM | POA: Diagnosis not present

## 2024-02-09 MED ORDER — LEVOTHYROXINE SODIUM 112 MCG PO TABS: 112.0000 ug | ORAL_TABLET | Freq: Every day | ORAL | 3 refills | Status: AC

## 2024-02-09 NOTE — Patient Instructions (Signed)

## 2024-02-09 NOTE — Progress Notes (Signed)
 Endocrinology Follow Up Note                                         02/09/2024, 9:09 AM  Subjective:   Subjective    Faith Hood is a 68 y.o.-year-old female patient being seen in follow up after being seen in consultation for hypothyroidism referred by Antonetta Rollene BRAVO, MD.   Past Medical History:  Diagnosis Date   Abnormal EKG 11/10/2011   EKG of 11/10/11: Normal sinus rhythm, delayed R-wave progression, low voltage in limb leads, nonspecific T wave abnormality.     Cerebral vascular disease    Carotid ultrasound in 10/2011: Mild plaque; tortuous vessels; no definite luminal obstruction.   Elevated LFTs    with positive ASMA and mildly elevated IgG, no biopsy pursued as LFTs normalized.   Hepatic steatosis    By CT scan   Hyperlipidemia    Hypertension    Hypothyroidism due to Hashimoto's thyroiditis 2021   Neuropathy    Overweight(278.02)    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HCC) 2021   Uterine leiomyoma    By CT scan    Past Surgical History:  Procedure Laterality Date   COLONOSCOPY  12/2007   Negative screening study   COLONOSCOPY N/A 08/17/2018   Surgeon: Shaaron Lamar HERO, MD; normal exam.  Repeat in 2030.   DIAGNOSTIC LAPAROSCOPY  1978   Gynecologic problems    Social History   Socioeconomic History   Marital status: Single    Spouse name: Not on file   Number of children: 0   Years of education: Not on file   Highest education level: Bachelor's degree (e.g., BA, AB, BS)  Occupational History   Occupation: Middle school teacher retired    Comment: X25 years  Tobacco Use   Smoking status: Never   Smokeless tobacco: Never  Vaping Use   Vaping status: Never Used  Substance and Sexual Activity   Alcohol use: Not Currently    Comment: occasionally. About twice a year.    Drug use: No   Sexual activity: Not Currently  Other Topics Concern   Not on file  Social History Narrative   Lives with  sister occasionally   Right Handed   Social Drivers of Health   Tobacco Use: Low Risk (02/09/2024)   Patient History    Smoking Tobacco Use: Never    Smokeless Tobacco Use: Never    Passive Exposure: Not on file  Financial Resource Strain: Low Risk (01/28/2024)   Overall Financial Resource Strain (CARDIA)    Difficulty of Paying Living Expenses: Not hard at all  Food Insecurity: No Food Insecurity (01/28/2024)   Epic    Worried About Programme Researcher, Broadcasting/film/video in the Last Year: Never true    Ran Out of Food in the Last Year: Never true  Transportation Needs: No Transportation Needs (01/28/2024)   Epic    Lack of Transportation (Medical): No    Lack of Transportation (Non-Medical): No  Physical Activity: Insufficiently Active (01/28/2024)  Exercise Vital Sign    Days of Exercise per Week: 2 days    Minutes of Exercise per Session: 30 min  Stress: No Stress Concern Present (01/28/2024)   [employer] of Occupational Health - Occupational Stress Questionnaire    Feeling of Stress: Not at all  Social Connections: Moderately Integrated (01/28/2024)   Social Connection and Isolation Panel    Frequency of Communication with Friends and Family: More than three times a week    Frequency of Social Gatherings with Friends and Family: Three times a week    Attends Religious Services: More than 4 times per year    Active Member of Clubs or Organizations: Yes    Attends Banker Meetings: 1 to 4 times per year    Marital Status: Never married  Depression (PHQ2-9): Low Risk (02/01/2024)   Depression (PHQ2-9)    PHQ-2 Score: 0  Alcohol Screen: Low Risk (08/18/2023)   Alcohol Screen    Last Alcohol Screening Score (AUDIT): 0  Housing: Unknown (01/28/2024)   Epic    Unable to Pay for Housing in the Last Year: No    Number of Times Moved in the Last Year: Not on file    Homeless in the Last Year: No  Utilities: Not At Risk (03/18/2023)   AHC Utilities    Threatened with loss of  utilities: No  Health Literacy: Not on file    Family History  Problem Relation Age of Onset   Pancreatic cancer Mother 34       Diagnosed in 2009; currently in hospice pt. htn   Hypertension Mother        + Sister x4   Cancer Father 21       brain tumor   Hypertension Sister    Hypertension Sister    Hypertension Sister    Stroke Sister 32   Hypertension Sister    Cancer Maternal Grandmother    Cancer Paternal Grandmother    Colon cancer Neg Hx    Breast cancer Neg Hx     Outpatient Encounter Medications as of 02/09/2024  Medication Sig   alendronate  (FOSAMAX ) 70 MG tablet Take 1 tablet (70 mg total) by mouth every 7 (seven) days. Take with a full glass of water  on an empty stomach.   aspirin EC 81 MG tablet Take 81 mg by mouth daily. Swallow whole.   carvedilol  (COREG ) 12.5 MG tablet Take 1 tablet (12.5 mg total) by mouth 2 (two) times daily with a meal.   clobetasol  cream (TEMOVATE ) 0.05 % Apply 1 Application topically 2 (two) times daily.   losartan -hydrochlorothiazide  (HYZAAR) 100-25 MG tablet Take 1 tablet by mouth once daily   Methotrexate 2.5 MG/ML SOLN 4 tablets Orally Once a week; Duration: 91 days   mycophenolate (CELLCEPT) 500 MG tablet Take 500 mg by mouth daily.   rosuvastatin  (CRESTOR ) 5 MG tablet Take 1 tablet (5 mg total) by mouth 3 (three) times a week.   spironolactone  (ALDACTONE ) 25 MG tablet Take 1 tablet (25 mg total) by mouth daily.   [DISCONTINUED] levothyroxine  (SYNTHROID ) 112 MCG tablet TAKE 1 TABLET BY MOUTH ONCE DAILY BEFORE BREAKFAST   levothyroxine  (SYNTHROID ) 112 MCG tablet Take 1 tablet (112 mcg total) by mouth daily before breakfast.   No facility-administered encounter medications on file as of 02/09/2024.    ALLERGIES: Allergies  Allergen Reactions   Amlodipine  Swelling    Chronic foot pain and notes swelling of feet with amlodipine     Atorvastatin  Other (See Comments)  Muscle pain   VACCINATION STATUS: Immunization History   Administered Date(s) Administered   Fluad Trivalent(High Dose 65+) 03/18/2023   INFLUENZA, HIGH DOSE SEASONAL PF 12/30/2023   Influenza Split 12/29/2010, 11/10/2011   Influenza Whole 01/24/2009, 12/17/2009   Influenza,inj,Quad PF,6+ Mos 12/14/2012, 01/31/2014, 01/09/2015, 02/04/2016, 11/23/2016, 12/06/2017, 12/12/2018, 12/26/2019, 10/14/2021   Influenza-Unspecified 01/09/2021   Janssen (J&J) SARS-COV-2 Vaccination 11/28/2019   Moderna Sars-Covid-2 Vaccination 11/02/2019, 11/30/2019, 04/28/2020, 02/19/2021   PNEUMOCOCCAL CONJUGATE-20 10/21/2022   PPD Test 12/14/2012, 12/19/2012   Td 12/17/2009   Tdap 05/08/2020   Zoster Recombinant(Shingrix ) 02/03/2018, 06/27/2018     HPI   Kenesha P Dlouhy is a patient with the above medical history. she was diagnosed with hypothyroidism at approximate age of 71 years (newly diagnosed in March 2022), which required subsequent initiation of thyroid  hormone supplementation. she was given various doses of Levothyroxine  since, currently on 88 micrograms. she reports compliance to this medication:  Taking it daily on empty stomach  with water , separated by >30 minutes before breakfast and other medications , and by at least 4 hours from calcium , iron, PPIs, multivitamins.  I reviewed patient's thyroid  tests:  Lab Results  Component Value Date   TSH 0.086 (L) 02/07/2024   TSH 0.523 06/15/2023   TSH 2.640 03/03/2023   TSH 2.58 02/18/2023   TSH 2.650 11/05/2022   TSH 2.770 07/31/2022   TSH 4.630 (H) 04/07/2022   TSH 7.280 (H) 09/15/2021   TSH 1.640 05/12/2021   TSH 5.260 (H) 03/13/2021   FREET4 1.46 02/07/2024   FREET4 1.44 06/15/2023   FREET4 0.96 03/03/2023   FREET4 1.15 11/05/2022   FREET4 1.22 07/31/2022   FREET4 1.06 04/07/2022   FREET4 0.97 09/15/2021   FREET4 1.22 05/12/2021   FREET4 0.86 03/13/2021   FREET4 0.83 11/13/2020   She also had thyroid  ultrasound in 08/2020 which showed multiple small nodules bilaterally, none of which met criteria  for dedicated follow up or biopsy.  Pt denies feeling nodules in neck, hoarseness, dysphagia/odynophagia, SOB with lying down.  she does family history of thyroid  disorders in her sister (unsure of which but knows it was surgically removed).  She does report her sister was unable to tolerate the generic form of the hormone and has done well with branded Synthroid .  No family history of thyroid  cancer.  No history of radiation therapy to head or neck.  No recent use of iodine supplements.  Denies use of Biotin containing supplements.  I reviewed her chart and she also has a history of HTN, HLD, transaminitis.   Review of systems  Constitutional: + Minimally fluctuating body weight,  current Body mass index is 25.86 kg/m. , no fatigue, no subjective hyperthermia, no subjective hypothermia Eyes: no blurry vision, no xerophthalmia ENT: no sore throat, no nodules palpated in throat, no dysphagia/odynophagia, no hoarseness Cardiovascular: no chest pain, no shortness of breath, no palpitations, no leg swelling Respiratory: no cough, no shortness of breath Gastrointestinal: no nausea/vomiting/diarrhea Musculoskeletal: no muscle/joint aches Skin: no rashes, no hyperemia Neurological: no tremors, no numbness, no tingling, no dizziness Psychiatric: no depression, no anxiety   Objective:   Objective     BP 104/66 (BP Location: Left Arm, Patient Position: Sitting)   Pulse (!) 58   Ht 5' 3 (1.6 m)   Wt 146 lb (66.2 kg)   BMI 25.86 kg/m  Wt Readings from Last 3 Encounters:  02/09/24 146 lb (66.2 kg)  02/01/24 148 lb (67.1 kg)  12/30/23 143 lb (64.9 kg)  BP Readings from Last 3 Encounters:  02/09/24 104/66  02/07/24 (!) 117/44  02/01/24 (!) 170/80      Physical Exam- Limited  Constitutional:  Body mass index is 25.86 kg/m. , not in acute distress, normal state of mind Eyes:  EOMI, no exophthalmos Musculoskeletal: no gross deformities, strength intact in all four extremities,  no gross restriction of joint movements Skin:  no rashes, no hyperemia Neurological: no tremor with outstretched hands   CMP ( most recent) CMP     Component Value Date/Time   NA 140 02/07/2024 1326   K 3.9 02/07/2024 1326   CL 100 02/07/2024 1326   CO2 24 02/07/2024 1326   GLUCOSE 80 02/07/2024 1326   GLUCOSE 84 02/18/2023 1354   BUN 17 02/07/2024 1326   CREATININE 1.03 (H) 02/07/2024 1326   CREATININE 0.82 02/18/2023 1354   CALCIUM  9.8 02/07/2024 1326   PROT 7.3 12/28/2023 1621   ALBUMIN 4.3 12/28/2023 1621   AST 13 12/28/2023 1621   ALT 7 12/28/2023 1621   ALKPHOS 73 12/28/2023 1621   BILITOT 0.4 12/28/2023 1621   GFRNONAA >60 12/04/2020 1428   GFRNONAA 80 11/28/2019 1449   GFRAA 93 11/28/2019 1449     Diabetic Labs (most recent): Lab Results  Component Value Date   HGBA1C 5.5 06/23/2018   HGBA1C 5.4 05/15/2016   HGBA1C 5.4 02/04/2016     Lipid Panel ( most recent) Lipid Panel     Component Value Date/Time   CHOL 140 12/28/2023 1621   TRIG 84 12/28/2023 1621   HDL 40 12/28/2023 1621   CHOLHDL 3.5 12/28/2023 1621   CHOLHDL 3.5 12/04/2020 1427   VLDL 13 12/04/2020 1427   LDLCALC 84 12/28/2023 1621   LDLCALC 143 (H) 09/14/2019 1340   LABVLDL 16 12/28/2023 1621       Lab Results  Component Value Date   TSH 0.086 (L) 02/07/2024   TSH 0.523 06/15/2023   TSH 2.640 03/03/2023   TSH 2.58 02/18/2023   TSH 2.650 11/05/2022   TSH 2.770 07/31/2022   TSH 4.630 (H) 04/07/2022   TSH 7.280 (H) 09/15/2021   TSH 1.640 05/12/2021   TSH 5.260 (H) 03/13/2021   FREET4 1.46 02/07/2024   FREET4 1.44 06/15/2023   FREET4 0.96 03/03/2023   FREET4 1.15 11/05/2022   FREET4 1.22 07/31/2022   FREET4 1.06 04/07/2022   FREET4 0.97 09/15/2021   FREET4 1.22 05/12/2021   FREET4 0.86 03/13/2021   FREET4 0.83 11/13/2020    Thyroid  US  from 09/06/20 CLINICAL DATA:  Hypothyroidism   EXAM: THYROID  ULTRASOUND   TECHNIQUE: Ultrasound examination of the thyroid  gland and  adjacent soft tissues was performed.   COMPARISON:  07/19/2018 and previous back to 04/09/2014   FINDINGS: Parenchymal Echotexture: Moderately heterogenous   Isthmus: 1.4 cm thickness, previously 1.2   Right lobe: 6.1 x 1.2 x 2.3 cm, previously 4.7 x 2.4 x 2.7   Left lobe: 5.9 x 3 x 2.6 cm, previously 5 x 2.5 x 2.3   _________________________________________________________   Estimated total number of nodules >/= 1 cm: 3   Number of spongiform nodules >/=  2 cm not described below (TR1): 0   Number of mixed cystic and solid nodules >/= 1.5 cm not described below (TR2): 0   _________________________________________________________   Nodule # 1:   Prior biopsy: No   Location: Right; superior   Maximum size: 1.6 cm; Other 2 dimensions: 1.1 x 1.4 cm, previously, 1.4 x 1.2 x 1.4 cm   Composition:  solid/almost completely solid (2)   Echogenicity: hyperechoic (1)   Shape: not taller-than-wide (0)   Margins: smooth (0)   Echogenic foci: none (0)   ACR TI-RADS total points: 3.   ACR TI-RADS risk category:  TR3 (3 points).   Significant change in size (>/= 20% in two dimensions and minimal increase of 2 mm): No   Change in features: No   Change in ACR TI-RADS risk category: No   ACR TI-RADS recommendations:   *Given size (>/= 1.5 - 2.4 cm) and appearance, a follow-up ultrasound in 1 year should be considered based on TI-RADS criteria.   _________________________________________________________   Nodule # 2: 0.6 cm calcification, mid left, stable; This nodule does NOT meet TI-RADS criteria for biopsy or dedicated follow-up.   _________________________________________________________   Nodule # 3:   Location: Left; superior   Maximum size: 1.2 cm; Other 2 dimensions: 0.8 x 0.9 cm   Composition: solid/almost completely solid (2)   Echogenicity: hyperechoic (1)   Shape: not taller-than-wide (0)   Margins: smooth (0)   Echogenic foci: none (0)   ACR  TI-RADS total points: 3.   ACR TI-RADS risk category: TR3 (3 points).   ACR TI-RADS recommendations:   Given size (<1.4 cm) and appearance, this nodule does NOT meet TI-RADS criteria for biopsy or dedicated follow-up.   _________________________________________________________   Nodule # 4:   Location: Isthmus; right of midline   Maximum size: 1.2 cm; Other 2 dimensions: 1 x 0.8 cm   Composition: solid/almost completely solid (2)   Echogenicity: isoechoic (1)   Shape: not taller-than-wide (0)   Margins: ill-defined (0)   Echogenic foci: none (0)   ACR TI-RADS total points: 3.   ACR TI-RADS risk category: TR3 (3 points).   ACR TI-RADS recommendations:   Given size (<1.4 cm) and appearance, this nodule does NOT meet TI-RADS criteria for biopsy or dedicated follow-up.   IMPRESSION: 1. Enlarged heterogenous thyroid  with nodules as above. None meet criteria for biopsy. 2. Recommend annual/biennial ultrasound follow-up of right nodule as above, until stability x5 years confirmed.   The above is in keeping with the ACR TI-RADS recommendations - J Am Coll Radiol 2017;14:587-595.     Electronically Signed   By: JONETTA Faes M.D.   On: 09/07/2020 08:13 --------------------------------------------------------------------------------------------------------- Thyroid  US  from 06/02/21 CLINICAL DATA:  Goiter.   EXAM: THYROID  ULTRASOUND   TECHNIQUE: Ultrasound examination of the thyroid  gland and adjacent soft tissues was performed.   COMPARISON:  09/06/2020, 07/19/2018   FINDINGS: Parenchymal Echotexture: Moderately heterogenous   Isthmus: 1.5 cm, previously 1.4 cm   Right lobe: 5.1 x 2.8 x 2.3 cm, previously 6.1 x 1.2 x 2.3 cm   Left lobe: 4.6 x 2.5 x 2.7 cm, previously 5.9 x 3.0 x 2.6 cm   _________________________________________________________   Estimated total number of nodules >/= 1 cm: 3   Number of spongiform nodules >/=  2 cm not described below  (TR1): 0   Number of mixed cystic and solid nodules >/= 1.5 cm not described below (TR2): 0   _________________________________________________________   Nodule # 1:   Prior biopsy: No   Location: Isthmus; Inferior   Maximum size: 1.4 cm; Other 2 dimensions: 1.0 x 0.9 cm, previously, 1.2 x 1.0 x 0.8 cm   Composition: solid/almost completely solid (2)   Echogenicity: hyperechoic (1)   Shape: not taller-than-wide (0)   Margins: ill-defined (0)   Echogenic foci: none (0)   ACR TI-RADS total points: 3.  ACR TI-RADS risk category:  TR3 (3 points).   Significant change in size (>/= 20% in two dimensions and minimal increase of 2 mm): No   Change in features: No   Change in ACR TI-RADS risk category: No   ACR TI-RADS recommendations:   Given size (<1.5 cm) and appearance, this nodule does NOT meet TI-RADS criteria for biopsy or dedicated follow-up.   _________________________________________________________   Nodule # 2 (previously labeled 1):   Prior biopsy: No   Location: Right; Superior   Maximum size: 1.5 cm; Other 2 dimensions: 1.3 x 1.2 cm, previously, 1.6 x 1.1 x 1.4 cm   Composition: solid/almost completely solid (2)   Echogenicity: hyperechoic (1)   Shape: not taller-than-wide (0)   Margins: smooth (0)   Echogenic foci: none (0)   ACR TI-RADS total points: 3.   ACR TI-RADS risk category:  TR3 (3 points).   Significant change in size (>/= 20% in two dimensions and minimal increase of 2 mm): No   Change in features: No   Change in ACR TI-RADS risk category: No   ACR TI-RADS recommendations:   *Given size (>/= 1.5 - 2.4 cm) and appearance, a follow-up ultrasound in 1 year should be considered based on TI-RADS criteria.   _________________________________________________________   Nodule # 3:   Prior biopsy: No   Location: Left; Superior   Maximum size: 1.0 cm; Other 2 dimensions: 0.9 x 0.8 cm, previously, 1.2 x 0.8 x 0.9 cm    Composition: solid/almost completely solid (2)   Echogenicity: hyperechoic (1)   Shape: not taller-than-wide (0)   Margins: smooth (0)   Echogenic foci: none (0)   ACR TI-RADS total points: 3.   ACR TI-RADS risk category:  TR3 (3 points).   Significant change in size (>/= 20% in two dimensions and minimal increase of 2 mm): No   Change in features: No   Change in ACR TI-RADS risk category: No   ACR TI-RADS recommendations:   Given size (<1.4 cm) and appearance, this nodule does NOT meet TI-RADS criteria for biopsy or dedicated follow-up.   _________________________________________________________   IMPRESSION: 1. Similar appearing enlarged, heterogenous thyroid  gland. 2. Similar appearance of previously visualized right superior solid thyroid  nodule (labeled 2, 1.5 cm, previously 1.6 cm) which again meets criteria (TI-RADS category 3) for 1 year ultrasound surveillance. This study marks 3 years stability. 3. The remaining visualized thyroid  nodules appear benign and do not warrant additional follow-up.   The above is in keeping with the ACR TI-RADS recommendations - J Am Coll Radiol 2017;14:587-595.   Ester Sides, MD   [facility]   [facility]     Electronically Signed   By: Ester Sides M.D.   On: 06/02/2021 14:28 -------------------------------------------------------------------------------------------------  Thyroid  US  from 03/16/23 CLINICAL DATA:  Hypothyroid.  Hashimoto's disease.   EXAM: THYROID  ULTRASOUND   TECHNIQUE: Ultrasound examination of the thyroid  gland and adjacent soft tissues was performed.   COMPARISON:  Multiple prior thyroid  ultrasounds including 05/25/2022; 06/02/2021; 09/06/2020; 07/19/2018   FINDINGS: Parenchymal Echotexture: Moderately heterogenous   Isthmus: 1.7 cm   Right lobe: 5.4 x 3.2 x 2.7 cm   Left lobe: 5.7 x 3.1 x 2.8 cm    _________________________________________________________   Estimated total number of nodules >/= 1 cm: 1   Number of spongiform nodules >/=  2 cm not described below (TR1): 0   Number of mixed cystic and solid nodules >/= 1.5 cm not described below (TR2): 0  _________________________________________________________   Nodule # 1: Solid hyperechoic nodule within the right mid gland measures 1.6 x 1.5 x 1.2 cm which is smaller compared to 1.8 x 1.8 x 1.3 cm measured previously. Decreasing size over time is consistent with benignity. No further follow-up is recommended.   Stable small dystrophic calcification present in the left mid gland measuring approximately 6 mm.   IMPRESSION: 1. Similar appearance of enlarged and heterogeneous thyroid  gland consistent with the clinical history of Hashimoto's thyroiditis. 2. Nodule within the right mid gland is decreasing in size over time consistent with benignity. No further follow-up is recommended for this lesion.   The above is in keeping with the ACR TI-RADS recommendations - J Am Coll Radiol 2017;14:587-595.     Electronically Signed   By: Wilkie Lent M.D.   On: 03/21/2023 08:23    Latest Reference Range & Units 11/05/22 16:08 02/18/23 13:54 03/03/23 11:36 06/15/23 15:28 02/07/24 13:27  TSH 0.450 - 4.500 uIU/mL 2.650 2.58 2.640 0.523 0.086 (L)  T4,Free(Direct) 0.82 - 1.77 ng/dL 8.84  9.03 8.55 8.53  (L): Data is abnormally low  Assessment & Plan:   ASSESSMENT / PLAN:  1. Hypothyroidism- r/t Hashimoto's thyroiditis  Patient with relatively new hypothyroidism, on levothyroxine  therapy. Her antibody testing confirms suspicion of autoimmune thyroid  dysfunction.    Her previsit thyroid  function tests are consistent with appropriate hormone replacement (TSH is slightly suppressed but FT4 is essentially same as last visit and she denies symptoms of over-replacement.   She is advised to continue her Levothyroxine  112 mcg po  daily before breakfast.  Will recheck TFTs prior to next visit and adjust dose further if needed.  - We discussed about correct intake of levothyroxine , at fasting, with water , separated by at least 30 minutes from breakfast, and separated by more than 4 hours from calcium , iron, multivitamins, acid reflux medications (PPIs). -Patient is made aware of the fact that thyroid  hormone replacement is needed for life, dose to be adjusted by periodic monitoring of thyroid  function tests.  2. Multinodular thyroid  Multiple thyroid  nodules recommending follow up with surveillance ultrasound in 1 year (around 05/2023).  Her repeat thyroid  ultrasound from 03/16/23 shows slight decrease in thyroid  nodule size, thus favoring benignity and therefore she will not need additional surveillance ultrasounds at this time.    I spent  14  minutes in the care of the patient today including review of labs from Thyroid  Function, CMP, and other relevant labs ; imaging/biopsy records (current and previous including abstractions from other facilities); face-to-face time discussing  her lab results and symptoms, medications doses, her options of short and long term treatment based on the latest standards of care / guidelines;   and documenting the encounter.  Orissa P Tullo  participated in the discussions, expressed understanding, and voiced agreement with the above plans.  All questions were answered to her satisfaction. she is encouraged to contact clinic should she have any questions or concerns prior to her return visit.   FOLLOW UP PLAN:  Return in about 1 year (around 02/08/2025) for Thyroid  follow up, Previsit labs.  Benton Rio, [facility] [address] [place], [place] [address] Phone: [phone] Fax: [phone]  02/09/2024, 9:09 AM

## 2024-02-11 LAB — ANCA PROFILE
Anti-MPO Antibodies: <0.2 U (ref 0.0–0.9)
Anti-PR3 Antibodies: <0.2 U (ref 0.0–0.9)
Atypical pANCA: <1:20 {titer}
C-ANCA: <1:20 {titer}
P-ANCA: <1:20 {titer}

## 2024-02-11 LAB — C-REACTIVE PROTEIN: CRP: 5 mg/L (ref 0–10)

## 2024-02-11 LAB — SEDIMENTATION RATE: Sed Rate: 27 mm/h (ref 0–40)

## 2024-03-14 ENCOUNTER — Other Ambulatory Visit: Payer: Self-pay

## 2024-03-14 MED ORDER — CARVEDILOL 12.5 MG PO TABS: 12.5000 mg | ORAL_TABLET | Freq: Two times a day (BID) | ORAL | 3 refills | Status: AC

## 2024-03-20 NOTE — Patient Instructions (Incomplete)
 [name],  Thank you for taking the time for your Medicare Wellness Visit. I appreciate your continued commitment to your health goals. Please review the care plan we discussed, and feel free to reach out if I can assist you further.  Please note that Annual Wellness Visits do not include a physical exam. Some assessments may be limited, especially if the visit was conducted virtually. If needed, we may recommend an in-person follow-up with your provider.  Ongoing Care Seeing your primary care provider every 3 to 6 months helps us  monitor your health and provide consistent, personalized care.   Referrals If a referral was made during today's visit and you haven't received any updates within two weeks, please contact the referred provider directly to check on the status.  Recommended Screenings:  Health Maintenance  Topic Date Due   COVID-19 Vaccine (5 - 2025-26 season) 10/25/2023   Medicare Annual Wellness Visit  03/17/2024   Breast Cancer Screening  03/23/2024   Osteoporosis screening with Bone Density Scan  03/23/2025   Colon Cancer Screening  08/16/2028   DTaP/Tdap/Td vaccine (3 - Td or Tdap) 05/09/2030   Pneumococcal Vaccine for age over 57  Completed   Flu Shot  Completed   Hepatitis C Screening  Completed   Zoster (Shingles) Vaccine  Completed   Meningitis B Vaccine  Aged Out       03/18/2023   11:04 AM  Advanced Directives  Does Patient Have a Medical Advance Directive? No  Would patient like information on creating a medical advance directive? Yes (ED - Information included in AVS)    Vision: Annual vision screenings are recommended for early detection of glaucoma, cataracts, and diabetic retinopathy. These exams can also reveal signs of chronic conditions such as diabetes and high blood pressure.  Dental: Annual dental screenings help detect early signs of oral cancer, gum disease, and other conditions linked to overall health, including heart disease and  diabetes.  Please see the attached documents for additional preventive care recommendations.

## 2024-03-21 ENCOUNTER — Ambulatory Visit: Payer: Medicare PPO

## 2024-03-24 ENCOUNTER — Ambulatory Visit (HOSPITAL_COMMUNITY)
Admission: RE | Admit: 2024-03-24 | Discharge: 2024-03-24 | Disposition: A | Source: Ambulatory Visit | Attending: Family Medicine | Admitting: Family Medicine

## 2024-03-24 ENCOUNTER — Other Ambulatory Visit: Payer: Self-pay | Admitting: Family Medicine

## 2024-03-24 DIAGNOSIS — I1A0 Resistant hypertension: Secondary | ICD-10-CM

## 2024-03-24 DIAGNOSIS — Z1231 Encounter for screening mammogram for malignant neoplasm of breast: Secondary | ICD-10-CM | POA: Insufficient documentation

## 2024-03-25 LAB — BMP8+EGFR
BUN/Creatinine Ratio: 18 (ref 12–28)
BUN: 17 mg/dL (ref 8–27)
CO2: 26 mmol/L (ref 20–29)
Calcium: 9.8 mg/dL (ref 8.7–10.3)
Chloride: 102 mmol/L (ref 96–106)
Creatinine, Ser: 0.93 mg/dL (ref 0.57–1.00)
Glucose: 83 mg/dL (ref 70–99)
Potassium: 4 mmol/L (ref 3.5–5.2)
Sodium: 142 mmol/L (ref 134–144)
eGFR: 68 mL/min/{1.73_m2}

## 2024-03-26 ENCOUNTER — Ambulatory Visit: Payer: Self-pay | Admitting: Family Medicine

## 2024-03-31 ENCOUNTER — Ambulatory Visit: Admitting: Family Medicine

## 2024-04-24 ENCOUNTER — Ambulatory Visit: Admitting: Family Medicine

## 2024-04-27 ENCOUNTER — Encounter (HOSPITAL_BASED_OUTPATIENT_CLINIC_OR_DEPARTMENT_OTHER): Admitting: Family

## 2025-02-08 ENCOUNTER — Ambulatory Visit: Admitting: Nurse Practitioner
